# Patient Record
Sex: Male | Born: 1950 | Race: White | Hispanic: No | Marital: Married | Smoking: Never smoker
Health system: Southern US, Community
[De-identification: ages and names within clinical notes are randomized; demographics above are authoritative.]

## PROBLEM LIST (undated history)

## (undated) ENCOUNTER — Inpatient Hospital Stay (HOSPITAL_COMMUNITY): Admission: RE | Payer: Medicare Other | Source: Ambulatory Visit

## (undated) DIAGNOSIS — S82009A Unspecified fracture of unspecified patella, initial encounter for closed fracture: Secondary | ICD-10-CM

## (undated) DIAGNOSIS — M8618 Other acute osteomyelitis, other site: Secondary | ICD-10-CM

## (undated) DIAGNOSIS — A4902 Methicillin resistant Staphylococcus aureus infection, unspecified site: Secondary | ICD-10-CM

## (undated) DIAGNOSIS — T8859XA Other complications of anesthesia, initial encounter: Secondary | ICD-10-CM

## (undated) DIAGNOSIS — Z8614 Personal history of Methicillin resistant Staphylococcus aureus infection: Secondary | ICD-10-CM

## (undated) DIAGNOSIS — T4145XA Adverse effect of unspecified anesthetic, initial encounter: Secondary | ICD-10-CM

## (undated) DIAGNOSIS — Z789 Other specified health status: Secondary | ICD-10-CM

## (undated) HISTORY — PX: COLONOSCOPY: SHX174

## (undated) HISTORY — PX: TONSILLECTOMY: SUR1361

## (undated) HISTORY — PX: REFRACTIVE SURGERY: SHX103

## (undated) HISTORY — PX: CATARACT EXTRACTION W/ INTRAOCULAR LENS  IMPLANT, BILATERAL: SHX1307

---

## 2006-01-30 ENCOUNTER — Ambulatory Visit (HOSPITAL_COMMUNITY): Payer: Self-pay | Admitting: Psychiatry

## 2014-06-11 ENCOUNTER — Encounter (HOSPITAL_COMMUNITY): Payer: Self-pay | Admitting: *Deleted

## 2014-06-11 NOTE — Progress Notes (Signed)
Dr. Magdalene Patricia office notified of need for pre-op orders.

## 2014-06-11 NOTE — Progress Notes (Signed)
06/11/14 1635  OBSTRUCTIVE SLEEP APNEA  Have you ever been diagnosed with sleep apnea through a sleep study? No  Do you snore loudly (loud enough to be heard through closed doors)?  1  Do you often feel tired, fatigued, or sleepy during the daytime? 0  Has anyone observed you stop breathing during your sleep? 0  Do you have, or are you being treated for high blood pressure? 0  BMI more than 35 kg/m2? 0  Age over 62 years old? 1  Neck circumference greater than 40 cm/16 inches? 1  Gender: 1  Obstructive Sleep Apnea Score 4  Score 4 or greater  Results sent to PCP

## 2014-06-12 ENCOUNTER — Encounter (HOSPITAL_COMMUNITY): Payer: No Typology Code available for payment source | Admitting: Anesthesiology

## 2014-06-12 ENCOUNTER — Encounter (HOSPITAL_COMMUNITY): Payer: Self-pay | Admitting: *Deleted

## 2014-06-12 ENCOUNTER — Ambulatory Visit (HOSPITAL_COMMUNITY): Payer: No Typology Code available for payment source

## 2014-06-12 ENCOUNTER — Encounter (HOSPITAL_COMMUNITY)
Admission: RE | Disposition: A | Discharge status: Home / Self Care | Payer: Self-pay | Source: Ambulatory Visit | Attending: Orthopedic Surgery

## 2014-06-12 ENCOUNTER — Observation Stay (HOSPITAL_COMMUNITY)
Admission: RE | Admit: 2014-06-12 | Discharge: 2014-06-13 | Disposition: A | Discharge status: Home / Self Care | Payer: No Typology Code available for payment source | Source: Ambulatory Visit | Attending: Orthopedic Surgery | Admitting: Orthopedic Surgery

## 2014-06-12 ENCOUNTER — Ambulatory Visit (HOSPITAL_COMMUNITY): Payer: No Typology Code available for payment source | Admitting: Anesthesiology

## 2014-06-12 DIAGNOSIS — Y998 Other external cause status: Secondary | ICD-10-CM | POA: Diagnosis not present

## 2014-06-12 DIAGNOSIS — Y92410 Unspecified street and highway as the place of occurrence of the external cause: Secondary | ICD-10-CM | POA: Insufficient documentation

## 2014-06-12 DIAGNOSIS — Y9389 Activity, other specified: Secondary | ICD-10-CM | POA: Diagnosis not present

## 2014-06-12 DIAGNOSIS — S82002A Unspecified fracture of left patella, initial encounter for closed fracture: Secondary | ICD-10-CM

## 2014-06-12 DIAGNOSIS — S82009A Unspecified fracture of unspecified patella, initial encounter for closed fracture: Secondary | ICD-10-CM | POA: Diagnosis present

## 2014-06-12 HISTORY — DX: Other specified health status: Z78.9

## 2014-06-12 HISTORY — PX: ORIF PATELLA: SHX5033

## 2014-06-12 LAB — CBC
HCT: 39.3 % (ref 39.0–52.0)
Hemoglobin: 13.3 g/dL (ref 13.0–17.0)
MCH: 30.2 pg (ref 26.0–34.0)
MCHC: 33.8 g/dL (ref 30.0–36.0)
MCV: 89.1 fL (ref 78.0–100.0)
PLATELETS: 227 10*3/uL (ref 150–400)
RBC: 4.41 MIL/uL (ref 4.22–5.81)
RDW: 12.7 % (ref 11.5–15.5)
WBC: 7.1 10*3/uL (ref 4.0–10.5)

## 2014-06-12 SURGERY — OPEN REDUCTION INTERNAL FIXATION (ORIF) PATELLA
Anesthesia: General | Laterality: Left

## 2014-06-12 MED ORDER — FENTANYL CITRATE 0.05 MG/ML IJ SOLN
INTRAMUSCULAR | Status: DC | PRN
  Administered 2014-06-12: 50 ug via INTRAVENOUS
  Administered 2014-06-12: 100 ug via INTRAVENOUS
  Administered 2014-06-12 (×2): 50 ug via INTRAVENOUS

## 2014-06-12 MED ORDER — CEFAZOLIN SODIUM-DEXTROSE 2-3 GM-% IV SOLR
INTRAVENOUS | Status: AC
  Filled 2014-06-12: qty 50

## 2014-06-12 MED ORDER — HYDROMORPHONE HCL PF 1 MG/ML IJ SOLN
INTRAMUSCULAR | Status: AC
  Administered 2014-06-12: 11:00:00
  Filled 2014-06-12: qty 2

## 2014-06-12 MED ORDER — OXYCODONE HCL 5 MG PO TABS: 5.0000 mg | ORAL_TABLET | ORAL | Status: DC | PRN

## 2014-06-12 MED ORDER — MIDAZOLAM HCL 5 MG/5ML IJ SOLN
INTRAMUSCULAR | Status: DC | PRN
Start: 2014-06-12 — End: 2014-06-12
  Administered 2014-06-12 (×2): 1 mg via INTRAVENOUS

## 2014-06-12 MED ORDER — GLYCOPYRROLATE 0.2 MG/ML IJ SOLN
INTRAMUSCULAR | Status: AC
  Filled 2014-06-12: qty 4

## 2014-06-12 MED ORDER — ONDANSETRON HCL 4 MG/2ML IJ SOLN: 4.0000 mg | Freq: Four times a day (QID) | INTRAMUSCULAR | Status: DC | PRN

## 2014-06-12 MED ORDER — ROCURONIUM BROMIDE 100 MG/10ML IV SOLN
INTRAVENOUS | Status: DC | PRN
  Administered 2014-06-12: 15 mg via INTRAVENOUS
  Administered 2014-06-12: 10 mg via INTRAVENOUS
  Administered 2014-06-12: 35 mg via INTRAVENOUS

## 2014-06-12 MED ORDER — BUPIVACAINE-EPINEPHRINE (PF) 0.25% -1:200000 IJ SOLN
INTRAMUSCULAR | Status: AC
  Filled 2014-06-12: qty 30

## 2014-06-12 MED ORDER — ROCURONIUM BROMIDE 50 MG/5ML IV SOLN
INTRAVENOUS | Status: AC
  Filled 2014-06-12: qty 1

## 2014-06-12 MED ORDER — LACTATED RINGERS IV SOLN
INTRAVENOUS | Status: DC | PRN
  Administered 2014-06-12 (×2): via INTRAVENOUS

## 2014-06-12 MED ORDER — LIDOCAINE HCL (CARDIAC) 20 MG/ML IV SOLN
INTRAVENOUS | Status: DC | PRN
  Administered 2014-06-12: 60 mg via INTRAVENOUS
  Administered 2014-06-12: 40 mg via INTRAVENOUS

## 2014-06-12 MED ORDER — OXYCODONE-ACETAMINOPHEN 5-325 MG PO TABS
ORAL_TABLET | ORAL | Status: AC
  Administered 2014-06-12: 12:00:00
  Filled 2014-06-12: qty 2

## 2014-06-12 MED ORDER — ONDANSETRON HCL 4 MG/2ML IJ SOLN
INTRAMUSCULAR | Status: DC | PRN
Start: 2014-06-12 — End: 2014-06-12
  Administered 2014-06-12: 4 mg via INTRAVENOUS

## 2014-06-12 MED ORDER — CEFAZOLIN SODIUM-DEXTROSE 2-3 GM-% IV SOLR
INTRAVENOUS | Status: DC | PRN
  Administered 2014-06-12: 2 g via INTRAVENOUS

## 2014-06-12 MED ORDER — NEOSTIGMINE METHYLSULFATE 10 MG/10ML IV SOLN
INTRAVENOUS | Status: DC | PRN
  Administered 2014-06-12 (×2): 2.5 mg via INTRAVENOUS

## 2014-06-12 MED ORDER — BUPIVACAINE-EPINEPHRINE (PF) 0.25% -1:200000 IJ SOLN
INTRAMUSCULAR | Status: DC | PRN
  Administered 2014-06-12: 20 mL

## 2014-06-12 MED ORDER — FENTANYL CITRATE 0.05 MG/ML IJ SOLN
INTRAMUSCULAR | Status: AC
  Filled 2014-06-12: qty 5

## 2014-06-12 MED ORDER — GLYCOPYRROLATE 0.2 MG/ML IJ SOLN
INTRAMUSCULAR | Status: DC | PRN
  Administered 2014-06-12 (×2): 0.4 mg via INTRAVENOUS

## 2014-06-12 MED ORDER — 0.9 % SODIUM CHLORIDE (POUR BTL) OPTIME
TOPICAL | Status: DC | PRN
  Administered 2014-06-12: 1000 mL

## 2014-06-12 MED ORDER — PROPOFOL 10 MG/ML IV BOLUS
INTRAVENOUS | Status: AC
  Filled 2014-06-12: qty 20

## 2014-06-12 MED ORDER — LIDOCAINE HCL (CARDIAC) 20 MG/ML IV SOLN
INTRAVENOUS | Status: AC
  Filled 2014-06-12: qty 5

## 2014-06-12 MED ORDER — NEOSTIGMINE METHYLSULFATE 10 MG/10ML IV SOLN
INTRAVENOUS | Status: AC
  Filled 2014-06-12: qty 1

## 2014-06-12 MED ORDER — HYDROMORPHONE HCL PF 1 MG/ML IJ SOLN
0.5000 mg | INTRAMUSCULAR | Status: DC | PRN
  Administered 2014-06-13 (×2): 1 mg via INTRAVENOUS
  Filled 2014-06-12 (×2): qty 1

## 2014-06-12 MED ORDER — PROPOFOL 10 MG/ML IV BOLUS
INTRAVENOUS | Status: DC | PRN
  Administered 2014-06-12: 150 mg via INTRAVENOUS
  Administered 2014-06-12: 20 mg via INTRAVENOUS
  Administered 2014-06-12: 30 mg via INTRAVENOUS

## 2014-06-12 MED ORDER — ONDANSETRON HCL 4 MG/2ML IJ SOLN
INTRAMUSCULAR | Status: AC
  Filled 2014-06-12: qty 2

## 2014-06-12 MED ORDER — OXYCODONE HCL 5 MG PO TABS
5.0000 mg | ORAL_TABLET | ORAL | Status: DC | PRN
  Administered 2014-06-12: 10 mg via ORAL
  Administered 2014-06-12: 5 mg via ORAL
  Administered 2014-06-12: 10 mg via ORAL
  Administered 2014-06-12: 5 mg via ORAL
  Administered 2014-06-13 (×5): 10 mg via ORAL
  Filled 2014-06-12: qty 2
  Filled 2014-06-12: qty 1
  Filled 2014-06-12 (×6): qty 2

## 2014-06-12 MED ORDER — METOCLOPRAMIDE HCL 10 MG PO TABS: 5.0000 mg | ORAL_TABLET | Freq: Three times a day (TID) | ORAL | Status: DC | PRN

## 2014-06-12 MED ORDER — OXYCODONE-ACETAMINOPHEN 5-325 MG PO TABS
1.0000 | ORAL_TABLET | ORAL | Status: DC | PRN
  Administered 2014-06-12 – 2014-06-13 (×5): 2 via ORAL
  Filled 2014-06-12 (×4): qty 2

## 2014-06-12 MED ORDER — MIDAZOLAM HCL 2 MG/2ML IJ SOLN
INTRAMUSCULAR | Status: AC
  Filled 2014-06-12: qty 2

## 2014-06-12 MED ORDER — HYDROMORPHONE HCL PF 1 MG/ML IJ SOLN
0.2500 mg | INTRAMUSCULAR | Status: DC | PRN
  Administered 2014-06-12 (×2): 0.5 mg via INTRAVENOUS
  Administered 2014-06-12 (×2): 1 mg via INTRAVENOUS
  Administered 2014-06-12 (×2): 0.5 mg via INTRAVENOUS

## 2014-06-12 MED ORDER — OXYCODONE HCL 5 MG PO TABS
ORAL_TABLET | ORAL | Status: AC
  Administered 2014-06-12: 15:00:00
  Filled 2014-06-12: qty 1

## 2014-06-12 MED ORDER — DEXTROSE 5 % IV SOLN
INTRAVENOUS | Status: DC | PRN
  Administered 2014-06-12: 09:00:00 via INTRAVENOUS

## 2014-06-12 MED ORDER — METOCLOPRAMIDE HCL 5 MG/ML IJ SOLN: 5.0000 mg | Freq: Three times a day (TID) | INTRAMUSCULAR | Status: DC | PRN

## 2014-06-12 MED ORDER — ONDANSETRON HCL 4 MG PO TABS: 4.0000 mg | ORAL_TABLET | Freq: Four times a day (QID) | ORAL | Status: DC | PRN

## 2014-06-12 SURGICAL SUPPLY — 59 items
BANDAGE ELASTIC 4 VELCRO ST LF (GAUZE/BANDAGES/DRESSINGS) ×2 IMPLANT
BANDAGE ELASTIC 6 VELCRO ST LF (GAUZE/BANDAGES/DRESSINGS) ×3 IMPLANT
BLADE SURG 10 STRL SS (BLADE) IMPLANT
BLADE SURG ROTATE 9660 (MISCELLANEOUS) ×2 IMPLANT
BNDG GAUZE ELAST 4 BULKY (GAUZE/BANDAGES/DRESSINGS) ×2 IMPLANT
BRUSH SCRUB DISP (MISCELLANEOUS) ×3 IMPLANT
COVER SURGICAL LIGHT HANDLE (MISCELLANEOUS) ×3 IMPLANT
CUFF TOURNIQUET SINGLE 34IN LL (TOURNIQUET CUFF) IMPLANT
CUFF TOURNIQUET SINGLE 44IN (TOURNIQUET CUFF) IMPLANT
DECANTER SPIKE VIAL GLASS SM (MISCELLANEOUS) IMPLANT
DRAPE C-ARM 42X72 X-RAY (DRAPES) ×2 IMPLANT
DRAPE C-ARMOR (DRAPES) ×2 IMPLANT
DRSG ADAPTIC 3X8 NADH LF (GAUZE/BANDAGES/DRESSINGS) ×2 IMPLANT
DRSG EMULSION OIL 3X3 NADH (GAUZE/BANDAGES/DRESSINGS) ×1 IMPLANT
DRSG PAD ABDOMINAL 8X10 ST (GAUZE/BANDAGES/DRESSINGS) ×1 IMPLANT
ELECT REM PT RETURN 9FT ADLT (ELECTROSURGICAL) ×2
ELECTRODE REM PT RTRN 9FT ADLT (ELECTROSURGICAL) ×1 IMPLANT
GLOVE BIO SURGEON STRL SZ7.5 (GLOVE) ×2 IMPLANT
GLOVE BIO SURGEON STRL SZ8 (GLOVE) ×2 IMPLANT
GLOVE BIOGEL PI IND STRL 7.5 (GLOVE) ×1 IMPLANT
GLOVE BIOGEL PI IND STRL 8 (GLOVE) ×1 IMPLANT
GLOVE BIOGEL PI INDICATOR 7.5 (GLOVE) ×1
GLOVE BIOGEL PI INDICATOR 8 (GLOVE) ×1
GOWN STRL REUS W/ TWL XL LVL3 (GOWN DISPOSABLE) ×1 IMPLANT
GOWN STRL REUS W/TWL 2XL LVL3 (GOWN DISPOSABLE) ×4 IMPLANT
GOWN STRL REUS W/TWL XL LVL3 (GOWN DISPOSABLE) ×2
KIT BASIN OR (CUSTOM PROCEDURE TRAY) ×2 IMPLANT
KIT ROOM TURNOVER OR (KITS) ×2 IMPLANT
MANIFOLD NEPTUNE II (INSTRUMENTS) ×1 IMPLANT
NEEDLE 22X1 1/2 (OR ONLY) (NEEDLE) IMPLANT
NS IRRIG 1000ML POUR BTL (IV SOLUTION) ×2 IMPLANT
PACK ORTHO EXTREMITY (CUSTOM PROCEDURE TRAY) ×2 IMPLANT
PAD ARMBOARD 7.5X6 YLW CONV (MISCELLANEOUS) ×4 IMPLANT
PAD CAST 4YDX4 CTTN HI CHSV (CAST SUPPLIES) ×2 IMPLANT
PADDING CAST COTTON 4X4 STRL (CAST SUPPLIES)
PASSER SUT SWANSON 36MM LOOP (INSTRUMENTS) IMPLANT
SPONGE GAUZE 4X4 12PLY STER LF (GAUZE/BANDAGES/DRESSINGS) ×1 IMPLANT
SPONGE SCRUB IODOPHOR (GAUZE/BANDAGES/DRESSINGS) ×2 IMPLANT
STAPLER VISISTAT 35W (STAPLE) ×1 IMPLANT
SUCTION FRAZIER TIP 10 FR DISP (SUCTIONS) ×1 IMPLANT
SUT ETHILON 3 0 PS 1 (SUTURE) ×1 IMPLANT
SUT FIBERWIRE #2 38 T-5 BLUE (SUTURE) ×4
SUT FIBERWIRE #5 38 CONV NDL (SUTURE) ×2
SUT STEEL 5 V 56 M (SUTURE) ×1 IMPLANT
SUT VIC AB 0 CT1 27 (SUTURE) ×2
SUT VIC AB 0 CT1 27XBRD ANBCTR (SUTURE) ×1 IMPLANT
SUT VIC AB 1 CT1 27 (SUTURE) ×2
SUT VIC AB 1 CT1 27XBRD ANBCTR (SUTURE) IMPLANT
SUT VIC AB 2-0 CT1 27 (SUTURE) ×2
SUT VIC AB 2-0 CT1 TAPERPNT 27 (SUTURE) ×2 IMPLANT
SUTURE FIBERWR #2 38 T-5 BLUE (SUTURE) IMPLANT
SUTURE FIBERWR #5 38 CONV NDL (SUTURE) IMPLANT
SYR CONTROL 10ML LL (SYRINGE) IMPLANT
TOWEL OR 17X24 6PK STRL BLUE (TOWEL DISPOSABLE) ×2 IMPLANT
TOWEL OR 17X26 10 PK STRL BLUE (TOWEL DISPOSABLE) ×4 IMPLANT
TUBE CONNECTING 12X1/4 (SUCTIONS) ×2 IMPLANT
UNDERPAD 30X30 INCONTINENT (UNDERPADS AND DIAPERS) ×2 IMPLANT
WATER STERILE IRR 1000ML POUR (IV SOLUTION) ×1 IMPLANT
YANKAUER SUCT BULB TIP NO VENT (SUCTIONS) ×1 IMPLANT

## 2014-06-12 NOTE — Brief Op Note (Signed)
06/12/2014  10:36 AM  PATIENT:  Brian Atkinson  63 y.o. male  PRE-OPERATIVE DIAGNOSIS:  left patella fracture  POST-OPERATIVE DIAGNOSIS:  left patella fracture  PROCEDURE:  Procedure(s): OPEN REDUCTION INTERNAL (ORIF) FIXATION PATELLA (Left), Repair of extensor mechanism.  SURGEON:  Surgeon(s) and Role:    * Budd Palmer, MD - Primary  PHYSICIAN ASSISTANT: Hart Carwin, RNFA  ANESTHESIA:   general  I/O:  Total I/O In: 1750 [I.V.:1750] Out: 100 [Blood:100]  SPECIMEN:  No Specimen  TOURNIQUET:  * No tourniquets in log *  DICTATION: .Other Dictation: Dictation Number [id number]

## 2014-06-12 NOTE — Progress Notes (Signed)
[facility] (OTS)  Subjective: Day of Surgery Procedure(s) (LRB): OPEN REDUCTION INTERNAL (ORIF) FIXATION PATELLA (Left) Patient reports pain as moderate and severe, but  Improving and thinks will be good to go tomorrow am.  Objective: Current Vitals Blood pressure 144/72, pulse 66, temperature 97.5 F (36.4 C), temperature source Oral, resp. rate 18, height  (1.803 m), weight 225 lb (102.059 kg), SpO2 98.00%. Vital signs in last 24 hours: Temp:  [97.1 F (36.2 C)-98 F (36.7 C)] 97.5 F (36.4 C) (09/04 1430) Pulse Rate:  [65-97] 66 (09/04 1630) Resp:  [8-20] 18 (09/04 1630) BP: (124-175)/(72-117) 144/72 mmHg (09/04 1630) SpO2:  [92 %-100 %] 98 % (09/04 1630) Weight:  [225 lb (102.059 kg)] 225 lb (102.059 kg) (09/04 0623) LABS  Recent Labs  06/12/14 0644  HGB 13.3    Physical Exam  Immobilizer in place Intact distal sensory and motor function   Imaging  Assessment/Plan: Day of Surgery Procedure(s) (LRB): OPEN REDUCTION INTERNAL (ORIF) FIXATION PATELLA (Left)  D/C to home tomorrow am  Myrene Galas, MD [facility] [phone] (p)   06/12/2014, 8:46 PM
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
## 2014-06-12 NOTE — Anesthesia Procedure Notes (Signed)
Procedure Name: Intubation Date/Time: 06/12/2014 8:27 AM Performed by: Darcey Nora B Pre-anesthesia Checklist: Patient identified, Emergency Drugs available, Suction available and Patient being monitored Patient Re-evaluated:Patient Re-evaluated prior to inductionOxygen Delivery Method: Circle system utilized Preoxygenation: Pre-oxygenation with 100% oxygen Intubation Type: IV induction Ventilation: Mask ventilation without difficulty Laryngoscope Size: Mac and 4 Grade View: Grade II Tube type: Oral Tube size: 7.5 mm Number of attempts: 1 Airway Equipment and Method: Stylet Secured at: 21 (cm at teeth) cm Tube secured with: Tape Dental Injury: Teeth and Oropharynx as per pre-operative assessment

## 2014-06-12 NOTE — Anesthesia Postprocedure Evaluation (Signed)
  Anesthesia Post-op Note  Patient: Brian Atkinson  Procedure(s) Performed: Procedure(s): OPEN REDUCTION INTERNAL (ORIF) FIXATION PATELLA (Left)  Patient Location: PACU  Anesthesia Type:General  Level of Consciousness: awake, alert , oriented and patient cooperative  Airway and Oxygen Therapy: Patient Spontanous Breathing  Post-op Pain: moderate  Post-op Assessment: Post-op Vital signs reviewed, Patient's Cardiovascular Status Stable, Respiratory Function Stable, Patent Airway, No signs of Nausea or vomiting and Pain level controlled  Post-op Vital Signs: stable  Last Vitals:  Filed Vitals:   06/12/14 1115  BP:   Pulse: 79  Temp:   Resp: 8    Complications: No apparent anesthesia complications

## 2014-06-12 NOTE — Anesthesia Preprocedure Evaluation (Addendum)
Anesthesia Evaluation  Patient identified by MRN, date of birth, ID band Patient awake    Reviewed: Allergy & Precautions, H&P , NPO status , Patient's Chart, lab work & pertinent test results  Airway Mallampati: II TM Distance: >3 FB Neck ROM: Full    Dental  (+) Teeth Intact, Caps, Dental Advisory Given   Pulmonary neg pulmonary ROS,          Cardiovascular negative cardio ROS  Rhythm:Regular Rate:Normal     Neuro/Psych    GI/Hepatic negative GI ROS, Neg liver ROS,   Endo/Other    Renal/GU negative Renal ROS     Musculoskeletal   Abdominal   Peds  Hematology   Anesthesia Other Findings Veneers top front row of teeth  Reproductive/Obstetrics                          Anesthesia Physical Anesthesia Plan  ASA: I  Anesthesia Plan: General   Post-op Pain Management:    Induction: Intravenous  Airway Management Planned: Oral ETT  Additional Equipment:   Intra-op Plan:   Post-operative Plan: Extubation in OR  Informed Consent: I have reviewed the patients History and Physical, chart, labs and discussed the procedure including the risks, benefits and alternatives for the proposed anesthesia with the patient or authorized representative who has indicated his/her understanding and acceptance.   Dental advisory given  Plan Discussed with: CRNA and Anesthesiologist  Anesthesia Plan Comments:         Anesthesia Quick Evaluation

## 2014-06-12 NOTE — Transfer of Care (Signed)
Immediate Anesthesia Transfer of Care Note  Patient: Brian Atkinson  Procedure(s) Performed: Procedure(s): OPEN REDUCTION INTERNAL (ORIF) FIXATION PATELLA (Left)  Patient Location: PACU  Anesthesia Type:General  Level of Consciousness: awake and patient cooperative  Airway & Oxygen Therapy: Patient Spontanous Breathing and Patient connected to nasal cannula oxygen  Post-op Assessment: Report given to PACU RN, Post -op Vital signs reviewed and stable and Patient moving all extremities  Post vital signs: Reviewed and stable  Complications: No apparent anesthesia complications

## 2014-06-12 NOTE — Discharge Instructions (Signed)
What to eat:  For your first meals, you should eat lightly; only small meals initially.  If you do not have nausea, you may eat larger meals.  Avoid spicy, greasy and heavy food.    General Anesthesia, Adult, Care After  Refer to this sheet in the next few weeks. These instructions provide you with information on caring for yourself after your procedure. Your health care provider may also give you more specific instructions. Your treatment has been planned according to current medical practices, but problems sometimes occur. Call your health care provider if you have any problems or questions after your procedure.  WHAT TO EXPECT AFTER THE PROCEDURE  After the procedure, it is typical to experience:  Sleepiness.  Nausea and vomiting. HOME CARE INSTRUCTIONS  For the first 24 hours after general anesthesia:  Have a responsible person with you.  Do not drive a car. If you are alone, do not take public transportation.  Do not drink alcohol.  Do not take medicine that has not been prescribed by your health care provider.  Do not sign important papers or make important decisions.  You may resume a normal diet and activities as directed by your health care provider.  Change bandages (dressings) as directed.  If you have questions or problems that seem related to general anesthesia, call the hospital and ask for the anesthetist or anesthesiologist on call. SEEK MEDICAL CARE IF:  You have nausea and vomiting that continue the day after anesthesia.  You develop a rash. SEEK IMMEDIATE MEDICAL CARE IF:  You have difficulty breathing.  You have chest pain.  You have any allergic problems. Document Released: 01/01/2001 Document Revised: 05/28/2013 Document Reviewed: 04/10/2013  [facility] Patient Information 2014 [place], [place].    [facility] Discharge Instructions  General Discharge Instructions  WEIGHT BEARING STATUS: Weightbearing as tolerated Left leg with knee in full  extension   RANGE OF MOTION/ACTIVITY: No knee ROM at this time, hinged brace on at all times locked out in full extension   Wound care: dressing changes starting on 06/14/2014, see detailed instructions below   Diet: as you were eating previously.  Can use over the counter stool softeners and bowel preparations, such as Miralax, to help with bowel movements.  Narcotics can be constipating.  Be sure to drink plenty of fluids  STOP SMOKING OR USING NICOTINE PRODUCTS!!!!  As discussed nicotine severely impairs your body's ability to heal surgical and traumatic wounds but also impairs bone healing.  Wounds and bone heal by forming microscopic blood vessels (angiogenesis) and nicotine is a vasoconstrictor (essentially, shrinks blood vessels).  Therefore, if vasoconstriction occurs to these microscopic blood vessels they essentially disappear and are unable to deliver necessary nutrients to the healing tissue.  This is one modifiable factor that you can do to dramatically increase your chances of healing your injury.    (This means no smoking, no nicotine gum, patches, etc)  DO NOT USE NONSTEROIDAL ANTI-INFLAMMATORY DRUGS (NSAID'S)  Using products such as Advil (ibuprofen), Aleve (naproxen), Motrin (ibuprofen) for additional pain control during fracture healing can delay and/or prevent the healing response.  If you would like to take over the counter (OTC) medication, Tylenol (acetaminophen) is ok.  However, some narcotic medications that are given for pain control contain acetaminophen as well. Therefore, you should not exceed more than 4000 mg of tylenol in a day if you do not have liver disease.  Also note that there are may OTC medicines, such as cold medicines and  allergy medicines that my contain tylenol as well.  If you have any questions about medications and/or interactions please ask your doctor/PA or your pharmacist.   PAIN MEDICATION USE AND EXPECTATIONS  You have likely been given narcotic  medications to help control your pain.  After a traumatic event that results in an fracture (broken bone) with or without surgery, it is ok to use narcotic pain medications to help control one's pain.  We understand that everyone responds to pain differently and each individual patient will be evaluated on a regular basis for the continued need for narcotic medications. Ideally, narcotic medication use should last no more than 6-8 weeks (coinciding with fracture healing).   As a patient it is your responsibility as well to monitor narcotic medication use and report the amount and frequency you use these medications when you come to your office visit.   We would also advise that if you are using narcotic medications, you should take a dose prior to therapy to maximize you participation.  IF YOU ARE ON NARCOTIC MEDICATIONS IT IS NOT PERMISSIBLE TO OPERATE A MOTOR VEHICLE (MOTORCYCLE/CAR/TRUCK/MOPED) OR HEAVY MACHINERY DO NOT MIX NARCOTICS WITH OTHER CNS (CENTRAL NERVOUS SYSTEM) DEPRESSANTS SUCH AS ALCOHOL       ICE AND ELEVATE INJURED/OPERATIVE EXTREMITY  Using ice and elevating the injured extremity above your heart can help with swelling and pain control.  Icing in a pulsatile fashion, such as 20 minutes on and 20 minutes off, can be followed.    Do not place ice directly on skin. Make sure there is a barrier between to skin and the ice pack.    Using frozen items such as frozen peas works well as the conform nicely to the are that needs to be iced.  USE AN ACE WRAP OR TED HOSE FOR SWELLING CONTROL  In addition to icing and elevation, Ace wraps or TED hose are used to help limit and resolve swelling.  It is recommended to use Ace wraps or TED hose until you are informed to stop.    When using Ace Wraps start the wrapping distally (farthest away from the body) and wrap proximally (closer to the body)   Example: If you had surgery on your leg or thing and you do not have a splint on, start the ace  wrap at the toes and work your way up to the thigh        If you had surgery on your upper extremity and do not have a splint on, start the ace wrap at your fingers and work your way up to the upper arm  IF YOU ARE IN A SPLINT OR CAST DO NOT REMOVE IT FOR ANY REASON   If your splint gets wet for any reason please contact the office immediately. You may shower in your splint or cast as long as you keep it dry.  This can be done by wrapping in a cast cover or garbage back (or similar)  Do Not stick any thing down your splint or cast such as pencils, money, or hangers to try and scratch yourself with.  If you feel itchy take benadryl as prescribed on the bottle for itching  IF YOU ARE IN A CAM BOOT (BLACK BOOT)  You may remove boot periodically. Perform daily dressing changes as noted below.  Wash the liner of the boot regularly and wear a sock when wearing the boot. It is recommended that you sleep in the boot until told otherwise  CALL  THE OFFICE WITH ANY QUESTIONS OR CONCERTS: [phone]     Discharge Pin Site Instructions  Dress pins daily with Kerlix roll starting on POD 2. Wrap the Kerlix so that it tamps the skin down around the pin-skin interface to prevent/limit motion of the skin relative to the pin.  (Pin-skin motion is the primary cause of pain and infection related to external fixator pin sites).  Remove any crust or coagulum that may obstruct drainage with a saline moistened gauze or soap and water.  After POD 3, if there is no discernable drainage on the pin site dressing, the interval for change can by increased to every other day.  You may shower with the fixator, cleaning all pin sites gently with soap and water.  If you have a surgical wound this needs to be completely dry and without drainage before showering.  The extremity can be lifted by the fixator to facilitate wound care and transfers.  Notify the office/Doctor if you experience increasing drainage, redness, or  pain from a pin site, or if you notice purulent (thick, snot-like) drainage.  Discharge Wound Care Instructions  Do NOT apply any ointments, solutions or lotions to pin sites or surgical wounds.  These prevent needed drainage and even though solutions like hydrogen peroxide kill bacteria, they also damage cells lining the pin sites that help fight infection.  Applying lotions or ointments can keep the wounds moist and can cause them to breakdown and open up as well. This can increase the risk for infection. When in doubt call the office.  Surgical incisions should be dressed daily.  If any drainage is noted, use one layer of adaptic, then gauze, Kerlix, and an ace wrap.  Once the incision is completely dry and without drainage, it may be left open to air out.  Showering may begin 36-48 hours later.  Cleaning gently with soap and water.  Traumatic wounds should be dressed daily as well.    One layer of adaptic, gauze, Kerlix, then ace wrap.  The adaptic can be discontinued once the draining has ceased    If you have a wet to dry dressing: wet the gauze with saline the squeeze as much saline out so the gauze is moist (not soaking wet), place moistened gauze over wound, then place a dry gauze over the moist one, followed by Kerlix wrap, then ace wrap.

## 2014-06-13 DIAGNOSIS — S82009A Unspecified fracture of unspecified patella, initial encounter for closed fracture: Secondary | ICD-10-CM | POA: Diagnosis not present
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
## 2014-06-13 NOTE — Progress Notes (Signed)
Discharge instructions and prescriptions reviewed with patient. Patient denies questions or concerns at this time. IV removed with no complications. VS stable. Patient discharged via wheelchair with personal belongings, discharge packet and prescriptions, and walker.

## 2014-06-16 ENCOUNTER — Inpatient Hospital Stay (HOSPITAL_COMMUNITY)
Admission: AD | Admit: 2014-06-16 | Discharge: 2014-06-23 | DRG: 854 | Disposition: A | Discharge status: Home Health | Payer: BC Managed Care – PPO | Source: Other Acute Inpatient Hospital | Attending: Internal Medicine | Admitting: Internal Medicine

## 2014-06-16 ENCOUNTER — Encounter (HOSPITAL_COMMUNITY): Payer: Self-pay

## 2014-06-16 ENCOUNTER — Inpatient Hospital Stay (HOSPITAL_COMMUNITY): Payer: BC Managed Care – PPO

## 2014-06-16 DIAGNOSIS — K5909 Other constipation: Secondary | ICD-10-CM | POA: Diagnosis present

## 2014-06-16 DIAGNOSIS — T8140XA Infection following a procedure, unspecified, initial encounter: Secondary | ICD-10-CM | POA: Diagnosis present

## 2014-06-16 DIAGNOSIS — R6510 Systemic inflammatory response syndrome (SIRS) of non-infectious origin without acute organ dysfunction: Secondary | ICD-10-CM

## 2014-06-16 DIAGNOSIS — D509 Iron deficiency anemia, unspecified: Secondary | ICD-10-CM | POA: Diagnosis present

## 2014-06-16 DIAGNOSIS — R5082 Postprocedural fever: Secondary | ICD-10-CM | POA: Diagnosis present

## 2014-06-16 DIAGNOSIS — E871 Hypo-osmolality and hyponatremia: Secondary | ICD-10-CM | POA: Diagnosis present

## 2014-06-16 DIAGNOSIS — S20219A Contusion of unspecified front wall of thorax, initial encounter: Secondary | ICD-10-CM | POA: Diagnosis present

## 2014-06-16 DIAGNOSIS — R52 Pain, unspecified: Secondary | ICD-10-CM | POA: Diagnosis present

## 2014-06-16 DIAGNOSIS — A419 Sepsis, unspecified organism: Secondary | ICD-10-CM | POA: Diagnosis present

## 2014-06-16 DIAGNOSIS — S82002A Unspecified fracture of left patella, initial encounter for closed fracture: Secondary | ICD-10-CM

## 2014-06-16 DIAGNOSIS — T4275XA Adverse effect of unspecified antiepileptic and sedative-hypnotic drugs, initial encounter: Secondary | ICD-10-CM | POA: Diagnosis present

## 2014-06-16 DIAGNOSIS — R110 Nausea: Secondary | ICD-10-CM | POA: Diagnosis present

## 2014-06-16 DIAGNOSIS — S91009A Unspecified open wound, unspecified ankle, initial encounter: Secondary | ICD-10-CM

## 2014-06-16 DIAGNOSIS — M009 Pyogenic arthritis, unspecified: Secondary | ICD-10-CM | POA: Diagnosis present

## 2014-06-16 DIAGNOSIS — A4901 Methicillin susceptible Staphylococcus aureus infection, unspecified site: Secondary | ICD-10-CM | POA: Diagnosis not present

## 2014-06-16 DIAGNOSIS — A4102 Sepsis due to Methicillin resistant Staphylococcus aureus: Secondary | ICD-10-CM

## 2014-06-16 DIAGNOSIS — K5900 Constipation, unspecified: Secondary | ICD-10-CM | POA: Diagnosis present

## 2014-06-16 DIAGNOSIS — R509 Fever, unspecified: Secondary | ICD-10-CM

## 2014-06-16 DIAGNOSIS — S81809A Unspecified open wound, unspecified lower leg, initial encounter: Secondary | ICD-10-CM | POA: Diagnosis present

## 2014-06-16 DIAGNOSIS — S82009A Unspecified fracture of unspecified patella, initial encounter for closed fracture: Secondary | ICD-10-CM | POA: Diagnosis present

## 2014-06-16 DIAGNOSIS — S81009A Unspecified open wound, unspecified knee, initial encounter: Secondary | ICD-10-CM | POA: Diagnosis present

## 2014-06-16 DIAGNOSIS — S82002P Unspecified fracture of left patella, subsequent encounter for closed fracture with malunion: Secondary | ICD-10-CM

## 2014-06-16 DIAGNOSIS — D62 Acute posthemorrhagic anemia: Secondary | ICD-10-CM | POA: Diagnosis not present

## 2014-06-16 DIAGNOSIS — D649 Anemia, unspecified: Secondary | ICD-10-CM | POA: Diagnosis present

## 2014-06-16 DIAGNOSIS — Y838 Other surgical procedures as the cause of abnormal reaction of the patient, or of later complication, without mention of misadventure at the time of the procedure: Secondary | ICD-10-CM | POA: Diagnosis present

## 2014-06-16 DIAGNOSIS — IMO0002 Reserved for concepts with insufficient information to code with codable children: Secondary | ICD-10-CM | POA: Diagnosis not present

## 2014-06-16 DIAGNOSIS — Z79899 Other long term (current) drug therapy: Secondary | ICD-10-CM | POA: Diagnosis not present

## 2014-06-16 DIAGNOSIS — M00869 Arthritis due to other bacteria, unspecified knee: Secondary | ICD-10-CM | POA: Diagnosis present

## 2014-06-16 DIAGNOSIS — R000 Tachycardia, unspecified: Secondary | ICD-10-CM | POA: Diagnosis present

## 2014-06-16 LAB — URINALYSIS, ROUTINE W REFLEX MICROSCOPIC
GLUCOSE, UA: NEGATIVE mg/dL
Hgb urine dipstick: NEGATIVE
KETONES UR: 40 mg/dL — AB
Leukocytes, UA: NEGATIVE
Nitrite: NEGATIVE
PH: 6.5 (ref 5.0–8.0)
Protein, ur: 100 mg/dL — AB
Specific Gravity, Urine: 1.029 (ref 1.005–1.030)
Urobilinogen, UA: 1 mg/dL (ref 0.0–1.0)

## 2014-06-16 LAB — C-REACTIVE PROTEIN: CRP: 18.6 mg/dL — AB (ref <0.60)

## 2014-06-16 LAB — URINE MICROSCOPIC-ADD ON

## 2014-06-16 LAB — SEDIMENTATION RATE: Sed Rate: 97 mm/hr — ABNORMAL HIGH (ref 0–16)

## 2014-06-16 MED ORDER — OXYCODONE HCL 5 MG PO TABS
5.0000 mg | ORAL_TABLET | ORAL | Status: DC | PRN
  Administered 2014-06-16: 5 mg via ORAL
  Administered 2014-06-16 – 2014-06-17 (×3): 10 mg via ORAL
  Filled 2014-06-16: qty 2
  Filled 2014-06-16: qty 1
  Filled 2014-06-16 (×2): qty 2

## 2014-06-16 MED ORDER — ACETAMINOPHEN 325 MG PO TABS
650.0000 mg | ORAL_TABLET | Freq: Four times a day (QID) | ORAL | Status: DC | PRN
Start: 2014-06-16 — End: 2014-06-23

## 2014-06-16 MED ORDER — ACETAMINOPHEN 500 MG PO TABS
500.0000 mg | ORAL_TABLET | Freq: Once | ORAL | Status: AC
  Administered 2014-06-16: 500 mg via ORAL
  Filled 2014-06-16: qty 1

## 2014-06-16 MED ORDER — TRAMADOL HCL 50 MG PO TABS
50.0000 mg | ORAL_TABLET | Freq: Two times a day (BID) | ORAL | Status: DC | PRN
  Filled 2014-06-16: qty 2

## 2014-06-16 MED ORDER — ACETAMINOPHEN 650 MG RE SUPP: 650.0000 mg | Freq: Four times a day (QID) | RECTAL | Status: DC | PRN

## 2014-06-16 MED ORDER — TRAMADOL HCL 50 MG PO TABS
50.0000 mg | ORAL_TABLET | Freq: Three times a day (TID) | ORAL | Status: DC | PRN
Start: 2014-06-16 — End: 2014-06-17
  Administered 2014-06-16: 100 mg via ORAL

## 2014-06-16 MED ORDER — ONDANSETRON HCL 4 MG PO TABS
4.0000 mg | ORAL_TABLET | Freq: Four times a day (QID) | ORAL | Status: DC | PRN
Start: 2014-06-16 — End: 2014-06-23

## 2014-06-16 MED ORDER — SODIUM CHLORIDE 0.9 % IV BOLUS (SEPSIS)
1000.0000 mL | Freq: Once | INTRAVENOUS | Status: AC
  Administered 2014-06-16: 1000 mL via INTRAVENOUS

## 2014-06-16 MED ORDER — HYDROCODONE-ACETAMINOPHEN 10-325 MG PO TABS
1.0000 | ORAL_TABLET | Freq: Four times a day (QID) | ORAL | Status: DC | PRN
  Administered 2014-06-16: 2 via ORAL
  Administered 2014-06-16 (×2): 1 via ORAL
  Administered 2014-06-17: 2 via ORAL
  Administered 2014-06-18 – 2014-06-22 (×10): 1 via ORAL
  Administered 2014-06-22 (×2): 2 via ORAL
  Administered 2014-06-23 (×2): 1 via ORAL
  Filled 2014-06-16 (×2): qty 1
  Filled 2014-06-16: qty 2
  Filled 2014-06-16 (×3): qty 1
  Filled 2014-06-16: qty 2
  Filled 2014-06-16 (×3): qty 1
  Filled 2014-06-16: qty 2
  Filled 2014-06-16 (×3): qty 1
  Filled 2014-06-16: qty 2
  Filled 2014-06-16 (×3): qty 1

## 2014-06-16 MED ORDER — HYDROMORPHONE HCL PF 1 MG/ML IJ SOLN
1.0000 mg | INTRAMUSCULAR | Status: DC | PRN
  Administered 2014-06-16 – 2014-06-18 (×4): 1 mg via INTRAVENOUS
  Filled 2014-06-16 (×4): qty 1

## 2014-06-16 MED ORDER — HYDROMORPHONE HCL PF 1 MG/ML IJ SOLN
1.0000 mg | INTRAMUSCULAR | Status: DC | PRN
  Administered 2014-06-16 (×2): 1 mg via INTRAVENOUS
  Filled 2014-06-16 (×2): qty 1

## 2014-06-16 MED ORDER — ONDANSETRON HCL 4 MG/2ML IJ SOLN: 4.0000 mg | Freq: Four times a day (QID) | INTRAMUSCULAR | Status: DC | PRN

## 2014-06-16 MED ORDER — ENOXAPARIN SODIUM 40 MG/0.4ML ~~LOC~~ SOLN
40.0000 mg | SUBCUTANEOUS | Status: DC
  Administered 2014-06-16 – 2014-06-18 (×3): 40 mg via SUBCUTANEOUS
  Filled 2014-06-16 (×4): qty 0.4

## 2014-06-16 MED ORDER — METHOCARBAMOL 500 MG PO TABS
1000.0000 mg | ORAL_TABLET | Freq: Four times a day (QID) | ORAL | Status: DC
  Administered 2014-06-16 (×3): 1000 mg via ORAL
  Administered 2014-06-17: 5000 mg via ORAL
  Administered 2014-06-17 – 2014-06-23 (×20): 1000 mg via ORAL
  Filled 2014-06-16 (×39): qty 2

## 2014-06-16 MED ORDER — DOCUSATE SODIUM 100 MG PO CAPS
100.0000 mg | ORAL_CAPSULE | Freq: Two times a day (BID) | ORAL | Status: DC
  Administered 2014-06-16 – 2014-06-18 (×3): 100 mg via ORAL

## 2014-06-16 NOTE — Progress Notes (Signed)
Utilization review completed.  
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
## 2014-06-16 NOTE — Progress Notes (Signed)
Will call TRH1 night call as pt has Fever of 102.7 despite po Vicodin @ 2130.

## 2014-06-17 ENCOUNTER — Inpatient Hospital Stay (HOSPITAL_COMMUNITY): Payer: BC Managed Care – PPO

## 2014-06-17 ENCOUNTER — Inpatient Hospital Stay (HOSPITAL_COMMUNITY): Payer: BC Managed Care – PPO | Admitting: Registered Nurse

## 2014-06-17 ENCOUNTER — Encounter (HOSPITAL_COMMUNITY): Payer: BC Managed Care – PPO | Admitting: Registered Nurse

## 2014-06-17 ENCOUNTER — Encounter (HOSPITAL_COMMUNITY): Payer: Self-pay | Admitting: Registered Nurse

## 2014-06-17 ENCOUNTER — Encounter (HOSPITAL_COMMUNITY)
Admission: AD | Disposition: A | Discharge status: Home Health | Payer: Self-pay | Source: Other Acute Inpatient Hospital | Attending: Internal Medicine

## 2014-06-17 DIAGNOSIS — M009 Pyogenic arthritis, unspecified: Secondary | ICD-10-CM

## 2014-06-17 DIAGNOSIS — M00869 Arthritis due to other bacteria, unspecified knee: Secondary | ICD-10-CM | POA: Diagnosis present

## 2014-06-17 DIAGNOSIS — R509 Fever, unspecified: Secondary | ICD-10-CM | POA: Diagnosis present

## 2014-06-17 DIAGNOSIS — A419 Sepsis, unspecified organism: Secondary | ICD-10-CM

## 2014-06-17 HISTORY — PX: IRRIGATION AND DEBRIDEMENT KNEE: SHX5185

## 2014-06-17 LAB — COMPREHENSIVE METABOLIC PANEL
ALBUMIN: 2.7 g/dL — AB (ref 3.5–5.2)
ALK PHOS: 74 U/L (ref 39–117)
ALT: 10 U/L (ref 0–53)
AST: 13 U/L (ref 0–37)
Anion gap: 13 (ref 5–15)
BUN: 9 mg/dL (ref 6–23)
CALCIUM: 8.6 mg/dL (ref 8.4–10.5)
CO2: 24 mEq/L (ref 19–32)
Chloride: 92 mEq/L — ABNORMAL LOW (ref 96–112)
Creatinine, Ser: 0.89 mg/dL (ref 0.50–1.35)
GFR calc non Af Amer: 90 mL/min — ABNORMAL LOW (ref >90)
GLUCOSE: 109 mg/dL — AB (ref 70–99)
POTASSIUM: 4.1 meq/L (ref 3.7–5.3)
SODIUM: 129 meq/L — AB (ref 137–147)
TOTAL PROTEIN: 7.3 g/dL (ref 6.0–8.3)
Total Bilirubin: 1 mg/dL (ref 0.3–1.2)

## 2014-06-17 LAB — CBC
HCT: 37.5 % — ABNORMAL LOW (ref 39.0–52.0)
HEMOGLOBIN: 12.4 g/dL — AB (ref 13.0–17.0)
MCH: 30 pg (ref 26.0–34.0)
MCHC: 33.1 g/dL (ref 30.0–36.0)
MCV: 90.8 fL (ref 78.0–100.0)
Platelets: 276 10*3/uL (ref 150–400)
RBC: 4.13 MIL/uL — AB (ref 4.22–5.81)
RDW: 12.3 % (ref 11.5–15.5)
WBC: 24.2 10*3/uL — ABNORMAL HIGH (ref 4.0–10.5)

## 2014-06-17 LAB — GRAM STAIN

## 2014-06-17 LAB — SURGICAL PCR SCREEN
MRSA, PCR: INVALID — AB
Staphylococcus aureus: INVALID — AB

## 2014-06-17 SURGERY — IRRIGATION AND DEBRIDEMENT KNEE
Anesthesia: General | Laterality: Left

## 2014-06-17 MED ORDER — PHENYLEPHRINE HCL 10 MG/ML IJ SOLN
INTRAMUSCULAR | Status: DC | PRN
  Administered 2014-06-17: 80 ug via INTRAVENOUS
  Administered 2014-06-17: 40 ug via INTRAVENOUS
  Administered 2014-06-17: 80 ug via INTRAVENOUS

## 2014-06-17 MED ORDER — LACTATED RINGERS IV SOLN
INTRAVENOUS | Status: DC | PRN
  Administered 2014-06-17: 10:00:00 via INTRAVENOUS

## 2014-06-17 MED ORDER — SODIUM CHLORIDE 0.9 % IV SOLN
INTRAVENOUS | Status: DC
  Administered 2014-06-17 – 2014-06-18 (×5): via INTRAVENOUS
  Filled 2014-06-17 (×11): qty 1000

## 2014-06-17 MED ORDER — VANCOMYCIN HCL 1000 MG IV SOLR
INTRAVENOUS | Status: DC | PRN
  Administered 2014-06-17: 1 g

## 2014-06-17 MED ORDER — PIPERACILLIN-TAZOBACTAM 3.375 G IVPB
3.3750 g | Freq: Three times a day (TID) | INTRAVENOUS | Status: DC
  Administered 2014-06-17 – 2014-06-18 (×4): 3.375 g via INTRAVENOUS
  Filled 2014-06-17 (×9): qty 50

## 2014-06-17 MED ORDER — MEPERIDINE HCL 50 MG/ML IJ SOLN: 6.2500 mg | INTRAMUSCULAR | Status: DC | PRN

## 2014-06-17 MED ORDER — ACETAMINOPHEN 10 MG/ML IV SOLN
1000.0000 mg | Freq: Once | INTRAVENOUS | Status: AC
  Administered 2014-06-17: 1000 mg via INTRAVENOUS
  Filled 2014-06-17: qty 100

## 2014-06-17 MED ORDER — FENTANYL CITRATE 0.05 MG/ML IJ SOLN: 25.0000 ug | INTRAMUSCULAR | Status: DC | PRN

## 2014-06-17 MED ORDER — FENTANYL CITRATE 0.05 MG/ML IJ SOLN
INTRAMUSCULAR | Status: AC
  Filled 2014-06-17: qty 5

## 2014-06-17 MED ORDER — PROPOFOL 10 MG/ML IV BOLUS
INTRAVENOUS | Status: AC
  Filled 2014-06-17: qty 20

## 2014-06-17 MED ORDER — LIDOCAINE HCL (CARDIAC) 20 MG/ML IV SOLN
INTRAVENOUS | Status: DC | PRN
  Administered 2014-06-17: 100 mg via INTRAVENOUS

## 2014-06-17 MED ORDER — SODIUM CHLORIDE 0.9 % IR SOLN
Status: AC
  Filled 2014-06-17: qty 1

## 2014-06-17 MED ORDER — PROPOFOL 10 MG/ML IV BOLUS
INTRAVENOUS | Status: DC | PRN
  Administered 2014-06-17: 200 mg via INTRAVENOUS

## 2014-06-17 MED ORDER — ONDANSETRON HCL 4 MG/2ML IJ SOLN
INTRAMUSCULAR | Status: DC | PRN
  Administered 2014-06-17: 4 mg via INTRAVENOUS

## 2014-06-17 MED ORDER — VANCOMYCIN HCL 10 G IV SOLR
2000.0000 mg | Freq: Once | INTRAVENOUS | Status: AC
  Administered 2014-06-17: 2000 mg via INTRAVENOUS
  Filled 2014-06-17 (×2): qty 2000

## 2014-06-17 MED ORDER — EPHEDRINE SULFATE 50 MG/ML IJ SOLN
INTRAMUSCULAR | Status: AC
  Filled 2014-06-17: qty 1

## 2014-06-17 MED ORDER — ONDANSETRON HCL 4 MG/2ML IJ SOLN
INTRAMUSCULAR | Status: AC
  Filled 2014-06-17: qty 2

## 2014-06-17 MED ORDER — ATROPINE SULFATE 0.4 MG/ML IJ SOLN
INTRAMUSCULAR | Status: AC
  Filled 2014-06-17: qty 2

## 2014-06-17 MED ORDER — BUPIVACAINE HCL (PF) 0.5 % IJ SOLN
INTRAMUSCULAR | Status: AC
  Filled 2014-06-17: qty 30

## 2014-06-17 MED ORDER — VANCOMYCIN HCL IN DEXTROSE 1-5 GM/200ML-% IV SOLN
1000.0000 mg | Freq: Two times a day (BID) | INTRAVENOUS | Status: DC
  Administered 2014-06-18 – 2014-06-20 (×6): 1000 mg via INTRAVENOUS
  Filled 2014-06-17 (×10): qty 200

## 2014-06-17 MED ORDER — OXYCODONE HCL 5 MG PO TABS
5.0000 mg | ORAL_TABLET | ORAL | Status: DC | PRN
  Administered 2014-06-17 – 2014-06-18 (×7): 10 mg via ORAL
  Filled 2014-06-17 (×7): qty 2

## 2014-06-17 MED ORDER — FENTANYL CITRATE 0.05 MG/ML IJ SOLN
INTRAMUSCULAR | Status: DC | PRN
  Administered 2014-06-17 (×5): 50 ug via INTRAVENOUS

## 2014-06-17 MED ORDER — MIDAZOLAM HCL 2 MG/2ML IJ SOLN
INTRAMUSCULAR | Status: AC
  Filled 2014-06-17: qty 2

## 2014-06-17 MED ORDER — SODIUM CHLORIDE 0.9 % IR SOLN
Status: DC | PRN
  Administered 2014-06-17: 6000 mL

## 2014-06-17 MED ORDER — CEFAZOLIN SODIUM-DEXTROSE 2-3 GM-% IV SOLR
INTRAVENOUS | Status: AC
  Filled 2014-06-17: qty 50

## 2014-06-17 MED ORDER — SODIUM CHLORIDE 0.9 % IJ SOLN
INTRAMUSCULAR | Status: AC
  Filled 2014-06-17: qty 10

## 2014-06-17 MED ORDER — PROMETHAZINE HCL 25 MG/ML IJ SOLN: 6.2500 mg | INTRAMUSCULAR | Status: DC | PRN

## 2014-06-17 MED ORDER — PIPERACILLIN-TAZOBACTAM 3.375 G IVPB
INTRAVENOUS | Status: AC
  Filled 2014-06-17: qty 50

## 2014-06-17 MED ORDER — MIDAZOLAM HCL 5 MG/5ML IJ SOLN
INTRAMUSCULAR | Status: DC | PRN
  Administered 2014-06-17: 1 mg via INTRAVENOUS

## 2014-06-17 MED ORDER — LIDOCAINE HCL (CARDIAC) 20 MG/ML IV SOLN
INTRAVENOUS | Status: AC
  Filled 2014-06-17: qty 5

## 2014-06-17 MED ORDER — TOBRAMYCIN SULFATE 1.2 G IJ SOLR
INTRAMUSCULAR | Status: DC | PRN
  Administered 2014-06-17: 1.2 g

## 2014-06-17 MED ORDER — VANCOMYCIN HCL 1000 MG IV SOLR
INTRAVENOUS | Status: AC
  Filled 2014-06-17: qty 1000

## 2014-06-17 MED ORDER — LACTATED RINGERS IV SOLN: INTRAVENOUS | Status: DC

## 2014-06-17 MED ORDER — PHENYLEPHRINE 40 MCG/ML (10ML) SYRINGE FOR IV PUSH (FOR BLOOD PRESSURE SUPPORT)
PREFILLED_SYRINGE | INTRAVENOUS | Status: AC
  Filled 2014-06-17: qty 10

## 2014-06-17 MED ORDER — TOBRAMYCIN SULFATE 1.2 G IJ SOLR
INTRAMUSCULAR | Status: AC
  Filled 2014-06-17: qty 1.2

## 2014-06-17 MED ORDER — TRAMADOL HCL 50 MG PO TABS: 50.0000 mg | ORAL_TABLET | Freq: Three times a day (TID) | ORAL | Status: DC | PRN

## 2014-06-17 SURGICAL SUPPLY — 51 items
BAG SPEC THK2 15X12 ZIP CLS (MISCELLANEOUS)
BAG ZIPLOCK 12X15 (MISCELLANEOUS) ×1 IMPLANT
BANDAGE ELASTIC 4 VELCRO ST LF (GAUZE/BANDAGES/DRESSINGS) ×1 IMPLANT
BANDAGE ELASTIC 6 VELCRO ST LF (GAUZE/BANDAGES/DRESSINGS) ×3 IMPLANT
BANDAGE ESMARK 6X9 LF (GAUZE/BANDAGES/DRESSINGS) ×1 IMPLANT
BNDG CMPR 9X6 STRL LF SNTH (GAUZE/BANDAGES/DRESSINGS) ×1
BNDG ESMARK 6X9 LF (GAUZE/BANDAGES/DRESSINGS) ×2
CUFF TOURN SGL QUICK 34 (TOURNIQUET CUFF)
CUFF TRNQT CYL 34X4X40X1 (TOURNIQUET CUFF) ×1 IMPLANT
DRAPE C-ARMOR (DRAPES) ×1 IMPLANT
DRAPE EXTREMITY T 121X128X90 (DRAPE) IMPLANT
DRAPE POUCH INSTRU U-SHP 10X18 (DRAPES) ×2 IMPLANT
DRAPE U-SHAPE 47X51 STRL (DRAPES) ×2 IMPLANT
DRSG ADAPTIC 3X8 NADH LF (GAUZE/BANDAGES/DRESSINGS) ×1 IMPLANT
DRSG PAD ABDOMINAL 8X10 ST (GAUZE/BANDAGES/DRESSINGS) ×1 IMPLANT
DRSG VAC ATS MED SENSATRAC (GAUZE/BANDAGES/DRESSINGS) ×1 IMPLANT
DURAPREP 26ML APPLICATOR (WOUND CARE) ×1 IMPLANT
ELECT REM PT RETURN 9FT ADLT (ELECTROSURGICAL) ×2
ELECTRODE REM PT RTRN 9FT ADLT (ELECTROSURGICAL) ×1 IMPLANT
EVACUATOR 1/8 PVC DRAIN (DRAIN) ×3 IMPLANT
GAUZE SPONGE 4X4 12PLY STRL (GAUZE/BANDAGES/DRESSINGS) ×1 IMPLANT
GLOVE BIO SURGEON STRL SZ7.5 (GLOVE) ×1 IMPLANT
GLOVE BIO SURGEON STRL SZ8 (GLOVE) ×2 IMPLANT
GLOVE BIOGEL PI IND STRL 8 (GLOVE) ×2 IMPLANT
GLOVE BIOGEL PI INDICATOR 8 (GLOVE) ×6
GLOVE ECLIPSE 8.0 STRL XLNG CF (GLOVE) IMPLANT
GOWN STRL REUS W/TWL LRG LVL3 (GOWN DISPOSABLE) ×1 IMPLANT
GOWN STRL REUS W/TWL XL LVL3 (GOWN DISPOSABLE) ×5 IMPLANT
HANDPIECE INTERPULSE COAX TIP (DISPOSABLE)
IMMOBILIZER KNEE 22  40 CIR (ORTHOPEDIC SUPPLIES) ×1
IMMOBILIZER KNEE 22 40 CIR (ORTHOPEDIC SUPPLIES) IMPLANT
KIT BASIN OR (CUSTOM PROCEDURE TRAY) ×2 IMPLANT
KIT STIMULAN RAPID CURE  10CC (Orthopedic Implant) ×1 IMPLANT
KIT STIMULAN RAPID CURE 10CC (Orthopedic Implant) IMPLANT
MANIFOLD NEPTUNE II (INSTRUMENTS) ×2 IMPLANT
PACK LOWER EXTREMITY WL (CUSTOM PROCEDURE TRAY) ×1 IMPLANT
PACK TOTAL JOINT (CUSTOM PROCEDURE TRAY) ×1 IMPLANT
PADDING CAST COTTON 6X4 STRL (CAST SUPPLIES) ×2 IMPLANT
POSITIONER SURGICAL ARM (MISCELLANEOUS) ×1 IMPLANT
SET HNDPC FAN SPRY TIP SCT (DISPOSABLE) ×1 IMPLANT
STAPLER VISISTAT 35W (STAPLE) ×1 IMPLANT
STRIP CLOSURE SKIN 1/2X4 (GAUZE/BANDAGES/DRESSINGS) ×2 IMPLANT
SUT MNCRL AB 4-0 PS2 18 (SUTURE) ×1 IMPLANT
SUT PDS AB 0 CT1 36 (SUTURE) ×2 IMPLANT
SUT PDS AB 2-0 CT2 27 (SUTURE) ×2 IMPLANT
SUT VIC AB 2-0 CT1 27 (SUTURE)
SUT VIC AB 2-0 CT1 TAPERPNT 27 (SUTURE) ×3 IMPLANT
SUT VLOC 180 0 24IN GS25 (SUTURE) ×1 IMPLANT
SWAB COLLECTION DEVICE MRSA (MISCELLANEOUS) ×2 IMPLANT
TOWEL OR 17X26 10 PK STRL BLUE (TOWEL DISPOSABLE) ×4 IMPLANT
TUBE ANAEROBIC SPECIMEN COL (MISCELLANEOUS) ×2 IMPLANT
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
## 2014-06-17 NOTE — Anesthesia Preprocedure Evaluation (Addendum)
Anesthesia Evaluation  Patient identified by MRN, date of birth, ID band Patient awake    Reviewed: Allergy & Precautions, H&P , NPO status , Patient's Chart, lab work & pertinent test results  Airway Mallampati: II TM Distance: >3 FB Neck ROM: Full    Dental no notable dental hx.    Pulmonary neg pulmonary ROS,  breath sounds clear to auscultation  Pulmonary exam normal       Cardiovascular negative cardio ROS  Rhythm:Regular Rate:Normal     Neuro/Psych negative neurological ROS  negative psych ROS   GI/Hepatic negative GI ROS, Neg liver ROS,   Endo/Other  negative endocrine ROS  Renal/GU negative Renal ROS  negative genitourinary   Musculoskeletal negative musculoskeletal ROS (+)   Abdominal   Peds negative pediatric ROS (+)  Hematology negative hematology ROS (+)   Anesthesia Other Findings   Reproductive/Obstetrics negative OB ROS                           Anesthesia Physical Anesthesia Plan  ASA: II  Anesthesia Plan: General   Post-op Pain Management:    Induction: Intravenous  Airway Management Planned: LMA  Additional Equipment:   Intra-op Plan:   Post-operative Plan: Extubation in OR  Informed Consent: I have reviewed the patients History and Physical, chart, labs and discussed the procedure including the risks, benefits and alternatives for the proposed anesthesia with the patient or authorized representative who has indicated his/her understanding and acceptance.   Dental advisory given  Plan Discussed with: CRNA  Anesthesia Plan Comments:         Anesthesia Quick Evaluation  

## 2014-06-17 NOTE — Treatment Plan (Signed)
Non invasive vascular - Left lower extremity venous duplex placed on hold per Dr. Ramiro Harvest, MD. until patient returns from surgery and is reevaluated. Brian Atkinson, RVS.

## 2014-06-17 NOTE — Brief Op Note (Signed)
06/16/2014 - 06/17/2014  12:08 PM  PATIENT:  Brian Atkinson  63 y.o. male  PRE-OPERATIVE DIAGNOSIS:  1. Infected left knee surgical wound  POST-OPERATIVE DIAGNOSIS:  1. Infected left knee surgical wound 2. Infected left knee joint  PROCEDURE:  Procedure(s): 1. IRRIGATION AND DEBRIDEMENT KNEE (Left) wound 2. Arthrotomy and lavage of left knee joint 3. Application of small wound vac  SURGEON:  Surgeon(s) and Role:    * Budd Palmer, MD - Primary  PHYSICIAN ASSISTANT: None  ANESTHESIA:   general  I/O:  Total I/O In: 1000 [I.V.:1000] Out: 200 [Urine:200]  SPECIMEN:  Source of Specimen:  left knee wound  DISPOSITION OF SPECIMEN:  To micro  FINDINGS: Gram positive cocci in clusters  TOURNIQUET:  * No tourniquets in log *  DICTATION: .Other Dictation: Dictation Number [id number]

## 2014-06-17 NOTE — Anesthesia Postprocedure Evaluation (Signed)
  Anesthesia Post-op Note  Patient: Brian Atkinson  Procedure(s) Performed: Procedure(s) (LRB): IRRIGATION AND DEBRIDEMENT KNEE (Left)  Patient Location: PACU  Anesthesia Type: General  Level of Consciousness: awake and alert   Airway and Oxygen Therapy: Patient Spontanous Breathing  Post-op Pain: mild  Post-op Assessment: Post-op Vital signs reviewed, Patient's Cardiovascular Status Stable, Respiratory Function Stable, Patent Airway and No signs of Nausea or vomiting  Last Vitals:  Filed Vitals:   06/17/14 1427  BP: 122/78  Pulse: 103  Temp: 37.1 C  Resp: 15    Post-op Vital Signs: stable   Complications: No apparent anesthesia complications

## 2014-06-17 NOTE — Progress Notes (Signed)
ANTIBIOTIC CONSULT NOTE - INITIAL  Pharmacy Consult for vancomycin, Zosyn Indication: L knee infection  No Known Allergies  Patient Measurements: Height:  (180.3 cm) Weight: 225 lb (102.059 kg) IBW/kg (Calculated) : 75.3  Vital Signs: Temp: 97.6 F (36.4 C) (09/09 0432) BP: 143/73 mmHg (09/08 2300) Pulse Rate: 110 (09/09 0432) Intake/Output from previous day: 09/08 0701 - 09/09 0700 In: 1120 [P.O.:120] Out: 525 [Urine:525] Intake/Output from this shift: Total I/O In: -  Out: 200 [Urine:200]  Labs:  Recent Labs  06/17/14 0425  WBC 24.2*  HGB 12.4*  PLT 276  CREATININE 0.89   Estimated Creatinine Clearance: 104.7 ml/min (by C-G formula based on Cr of 0.89). No results found for this basename: VANCOTROUGH, VANCOPEAK, VANCORANDOM, GENTTROUGH, GENTPEAK, GENTRANDOM, TOBRATROUGH, TOBRAPEAK, TOBRARND, AMIKACINPEAK, AMIKACINTROU, AMIKACIN,  in the last 72 hours   Microbiology: Recent Results (from the past 720 hour(s))  CULTURE, BLOOD (ROUTINE X 2)     Status: None   Collection Time    06/16/14 12:50 PM      Result Value Ref Range Status   Specimen Description BLOOD LEFT WRIST   Final   Special Requests BOTTLES DRAWN AEROBIC ONLY 5CC   Final   Culture  Setup Time     Final   Value: 06/16/2014 17:08     Performed at [facility]   Culture     Final   Value:        BLOOD CULTURE RECEIVED NO GROWTH TO DATE CULTURE WILL BE HELD FOR 5 DAYS BEFORE ISSUING A FINAL NEGATIVE REPORT     Performed at [facility]   Report Status PENDING   Incomplete  CULTURE, BLOOD (ROUTINE X 2)     Status: None   Collection Time    06/16/14 12:50 PM      Result Value Ref Range Status   Specimen Description BLOOD RIGHT ARM   Final   Special Requests BOTTLES DRAWN AEROBIC AND ANAEROBIC 6CC   Final   Culture  Setup Time     Final   Value: 06/16/2014 17:07     Performed at [facility]   Culture     Final   Value:        BLOOD CULTURE RECEIVED NO GROWTH TO DATE  CULTURE WILL BE HELD FOR 5 DAYS BEFORE ISSUING A FINAL NEGATIVE REPORT     Performed at [facility]   Report Status PENDING   Incomplete    Medical History: Past Medical History  Diagnosis Date  . Medical history non-contributory     Medications:  Scheduled:  . [facility] HOLD] docusate sodium  100 mg Oral BID  . [MAR HOLD] enoxaparin (LOVENOX) injection  40 mg Subcutaneous Q24H  . [facility] HOLD] methocarbamol  1,000 mg Oral QID   Infusions:  . sodium chloride 0.9 % 1,000 mL infusion     Assessment: 63 yo male presented to ER with probably left knee infection/questionable septic arthritis s/p recent ORIF of left patellar fracture 9/4. Went to OR for I&D and now starting vancomycin and Zosyn per pharmacy  9/9 >> vancomycin >> 9/9 >> Zosyn >>   Tmax 102.7  WBC 24.2  SCr 0.89, CrCl N 87  Cultures: 9/8 blood: pending 9/8 urine: pending 9/9 left knee: pending  Goal of Therapy:  Vancomycin trough level 15-20 mcg/ml  Plan:  1) Vancomycin 2g IV x 1 then 1g IV q12 2) Zosyn 3.375g IV q8 (extended interval infusion)   Hessie Knows, PharmD,  BCPS Pager [phone] 06/17/2014 10:20 AM

## 2014-06-17 NOTE — Progress Notes (Signed)
I discussed with the patient the risks and benefits of surgery for I&D of left knee, including the possibility of infection, nerve injury, vessel injury, wound breakdown, arthritis, symptomatic hardware, DVT/ PE, loss of motion, and need for further surgery among others.  He understood these risks and wished to proceed.  Myrene Galas, MD [facility] [phone] (p)

## 2014-06-17 NOTE — Op Note (Signed)
NAMEFRIEDRICH, [name]                ACCOUNT NO.:  [id number]  MEDICAL RECORD NO.:  [id number]  LOCATION:  1606                         FACILITY:  [facility]  PHYSICIAN:  Doralee Albino. Carola Frost, M.D. DATE OF BIRTH:  11-20-1950  DATE OF PROCEDURE:  06/17/2014 DATE OF DISCHARGE:                              OPERATIVE REPORT   PREOPERATIVE DIAGNOSIS:  Left surgical wound infection.  POSTOPERATIVE DIAGNOSES: 1. Infected left knee surgical wound. 2. Infected left knee joint.  PROCEDURE: 1. Incision and drainage of left knee wound. 2. Arthrotomy and lavage of left knee joint. 3. Placement of antibiotic beads 4. Application of small wound VAC.  SURGEON:  Doralee Albino. Carola Frost, M.D.  ASSISTANT:  None.  ANESTHESIA:  General.  COMPLICATIONS:  None.  I/O:  1000 mL of crystalloid/200 mL UOP.  SPECIMENS:  Two anaerobic, aerobic cultures sent to micro from the left knee wound.  FINDINGS:  Gram-positive cocci in clusters.  DRAINS:  Two Hemovac within the joint and a wound VAC of the left knee wound.  DISPOSITION:  To PACU.  CONDITION:  Stable.  BRIEF SUMMARY OF INDICATION FOR PROCEDURE:  Brian Atkinson is a 63 year old male status post ORIF of a distal pole patella fracture treated with excision and primary repair of the tendon to the patella using Krackow stitch and multiple fiber wires.  The patient did have a small seemingly stable traumatic wound over the anterior aspect of the knee.  He has had several days of progressive pain and became febrile.  This morning, he began to drain purulence from his wound.  We discussed with him the risks and benefits of surgical debridement including the possibility of loss of integrity of his repair and the need for revision, nerve injury, vessel injury, DVT, PE, persistent infection, arthritis, and many others.  He understood these risks and did wish to proceed.  BRIEF SUMMARY OF PROCEDURE:  Brian Atkinson was not given any antibiotics so that we could  obtain adequate cultures.  He was taken to the operating room where general anesthesia was induced.  Standard prep and drape was performed.  Time-out held and then the sutures removed.  This immediately allowed further egress of purulence that initially appeared to be superficial along both sides of the retinaculum anterior to the knee joint.  I then made an arthrotomy along the lateral aspect of the knee joint when squeezing out the suprapatellar pouch did reveal some purulence creeping into the field.  With the arthrotomy, further egress of purulence was identified from the knee joint.  I passed a curette into the suprapatellar pouch and scraped the lining of the synovium.  I was very careful to avoid any of the articular surface.  I did this in the medial and lateral gutters as well.  I also scraped along the retinaculum superficial to the knee joint.  Most of the infection was in this area.  I then irrigated with 6000 mL of high volume low pressure saline and took the knee through a range of motion throughout the lavage to facilitate removal of all infectious material.  I irrigated the superficial tissues in like manner.  I did evaluate the repair directly and did  not palpate any significant loosening of the repair.  The integrity of the tendon within the Krackow stitches appeared well preserved.  I also obtained C-arm AP image and then under the lateral image carefully watched the repair as I brought him from full extension to 90 degrees of flexion with no slippage or diastasis there as well. Knee was then brought back into full extension.  I did place Stimulan antibiotic beads containing vancomycin and tobramycin within the lateral gutter of the arthrotomy and also superficially along the retinaculum and tendon both medially and laterally and in the suprapatellar pouch. Lastly I placed a wound VAC and prior to closing the arthrotomy had placed a medium Hemovac within the  joint.  Brian Atkinson will return to the OR again in 48 hours for removal of the [facility] and closure.  At that time, I will anticipate discharge to home in a knee immobilizer or locked hinged knee brace.  We will plan to see him back in the office for reassessment in 10 days.  Because I will be out of town later this week, I will need to have a colleague assist me with closure using PDS and nylon.  We will obtain PICC line in the interim and we will follow the sensitivities with tapering off the antibiotics appropriately.  I would expect to retain his sutures until he goes on to unite with possible elective removal sometime after 3-4 months depending on examination and progress, and he may require suppressive antibiotics until then.  We will obtain a sed rate and CRP as a baseline.  He is at elevated risk for loss of the reduction and nonunion as well as persistent infection and early onset arthritis as a result of this complication.  This has been discussed with the patient and his family.     Doralee Albino. Carola Frost, M.D.     MHH/MEDQ  D:  06/17/2014  T:  06/17/2014  Job:  [id number]

## 2014-06-17 NOTE — Consult Note (Signed)
I have seen and examined the patient. I agree with the findings above.  Budd Palmer, MD 06/17/2014 10:11 AM
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
## 2014-06-17 NOTE — Transfer of Care (Signed)
Immediate Anesthesia Transfer of Care Note  Patient: Brian Atkinson  Procedure(s) Performed: Procedure(s): IRRIGATION AND DEBRIDEMENT KNEE (Left)  Patient Location: PACU  Anesthesia Type:General  Level of Consciousness: awake, alert , oriented and patient cooperative  Airway & Oxygen Therapy: Patient Spontanous Breathing and Patient connected to face mask oxygen  Post-op Assessment: Report given to PACU RN, Post -op Vital signs reviewed and stable and Patient moving all extremities  Post vital signs: Reviewed and stable  Complications: No apparent anesthesia complications
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
## 2014-06-17 NOTE — Progress Notes (Signed)
I have seen and examined the patient. I agree with the findings above.  Diezel Mazur H, MD 06/17/2014 10:11 AM   

## 2014-06-17 NOTE — Progress Notes (Signed)
[facility] PROGRESS NOTE  Brian Atkinson ZOX:[id number] DOB: 1950-12-24 DOA: 06/16/2014 PCP: No PCP Per Patient  Assessment/Plan: #1 sepsis Patient noted to have a temp of 102.7 last night with heart rate as high as 117. CBC with a white count of 24.2. Patient status post ORIF for left patellar fracture on 06/12/2014 now presenting with left knee swelling, erythema, warmth, tenderness to palpation with noted purulent drainage. Blood cultures are pending. Urinalysis was negative. Patient has been assessed by orthopedics and patient is scheduled to go to the operating room today for I and D with wound cultures. Concern for septic knee. Will place patient empirically on IV vancomycin IV Zosyn postoperatively. Follow.  #2 probable left knee infection/questionable septic arthritis Patient with recent ORIF of the left patellar 06/12/2014 presenting with left knee pain, swelling, erythema, tenderness to palpation. Patient noted to be febrile with temp as high as 102.7 last night. Patient has been assessed by orthopedics and patient to the operating room today for I and D and wound cultures. Postoperatively we'll place empirically on IV vancomycin IV Zosyn. Blood cultures pending. Follow.  #3 fever/leukocytosis Likely secondary to problem #2. Blood cultures are pending. Urinalysis was negative. Postoperatively once wound cultures have been obtained we'll place empirically on IV vancomycin IV Zosyn.  #4 hyponatremia Likely secondary to hypovolemic hyponatremia. Place on IV fluids. Follow.  #5 uncontrolled pain Pain management.  #6 elevated blood pressure Likely secondary to pain. Patient not on antihypertensives at home. We'll monitor for now. Pain management.  #7 prophylaxis Lovenox for DVT prophylaxis.  Code Status: Full Family Communication: Updated patient and wife at bedside. Disposition Plan: Pending surgery.   Consultants:  Orthopedics: Montez Morita, PA  06/16/2014  Procedures:  Chest x-ray 06/16/2014    Antibiotics:  IV vancomycin to be started postop 06/17/2014  IV Zosyn to be started postop 06/17/2014  HPI/Subjective: Patient complaining of left leg pain. Per nursing dressing around the left knee were saturated with purulent drainage once immobilizer was removed. Patient noted to be afebrile overnight and tachycardic.  Objective: Filed Vitals:   06/17/14 0432  BP:   Pulse: 110  Temp: 97.6 F (36.4 C)  Resp:     Intake/Output Summary (Last 24 hours) at 06/17/14 0924 Last data filed at 06/17/14 0412  Gross per 24 hour  Intake   1120 ml  Output    525 ml  Net    595 ml   Filed Weights   06/16/14 1100  Weight: 102.059 kg (225 lb)    Exam:   General:  NAD  Cardiovascular: Tachycardia, RR.  Respiratory: Clear to auscultation bilaterally  Abdomen: Soft, nontender, nondistended, positive bowel sounds.  Musculoskeletal: No clubbing cyanosis. Left knee exquisitely tender to palpation, erythematous, warmth, knee and distal thigh tight. Purulent drainage from wound noted per nursing.  Data Reviewed: Basic Metabolic Panel:  Recent Labs Lab 06/17/14 0425  NA 129*  K 4.1  CL 92*  CO2 24  GLUCOSE 109*  BUN 9  CREATININE 0.89  CALCIUM 8.6   Liver Function Tests:  Recent Labs Lab 06/17/14 0425  AST 13  ALT 10  ALKPHOS 74  BILITOT 1.0  PROT 7.3  ALBUMIN 2.7*   No results found for this basename: LIPASE, AMYLASE,  in the last 168 hours No results found for this basename: AMMONIA,  in the last 168 hours CBC:  Recent Labs Lab 06/12/14 0644 06/17/14 0425  WBC 7.1 24.2*  HGB 13.3 12.4*  HCT 39.3 37.5*  MCV 89.1 90.8  PLT 227 276   Cardiac Enzymes: No results found for this basename: CKTOTAL, CKMB, CKMBINDEX, TROPONINI,  in the last 168 hours BNP (last 3 results) No results found for this basename: PROBNP,  in the last 8760 hours CBG: No results found for this basename: GLUCAP,  in the last  168 hours  Recent Results (from the past 240 hour(s))  CULTURE, BLOOD (ROUTINE X 2)     Status: None   Collection Time    06/16/14 12:50 PM      Result Value Ref Range Status   Specimen Description BLOOD LEFT WRIST   Final   Special Requests BOTTLES DRAWN AEROBIC ONLY 5CC   Final   Culture  Setup Time     Final   Value: 06/16/2014 17:08     Performed at [facility]   Culture     Final   Value:        BLOOD CULTURE RECEIVED NO GROWTH TO DATE CULTURE WILL BE HELD FOR 5 DAYS BEFORE ISSUING A FINAL NEGATIVE REPORT     Performed at [facility]   Report Status PENDING   Incomplete  CULTURE, BLOOD (ROUTINE X 2)     Status: None   Collection Time    06/16/14 12:50 PM      Result Value Ref Range Status   Specimen Description BLOOD RIGHT ARM   Final   Special Requests BOTTLES DRAWN AEROBIC AND ANAEROBIC 6CC   Final   Culture  Setup Time     Final   Value: 06/16/2014 17:07     Performed at [facility]   Culture     Final   Value:        BLOOD CULTURE RECEIVED NO GROWTH TO DATE CULTURE WILL BE HELD FOR 5 DAYS BEFORE ISSUING A FINAL NEGATIVE REPORT     Performed at [facility]   Report Status PENDING   Incomplete     Studies: Dg Chest Port 1 View  06/16/2014   CLINICAL DATA:  Postoperative fever  EXAM: PORTABLE CHEST - 1 VIEW  COMPARISON:  Portable exam 1222 hr without priors for comparison.  FINDINGS: Normal heart size, mediastinal contours and pulmonary vascularity.  Lungs clear.  No pleural effusion or pneumothorax.  Bones unremarkable.  IMPRESSION: No acute abnormalities.   Electronically Signed   By: Ulyses Southward M.D.   On: 06/16/2014 12:58    Scheduled Meds: . [facility] HOLD] docusate sodium  100 mg Oral BID  . [MAR HOLD] enoxaparin (LOVENOX) injection  40 mg Subcutaneous Q24H  . [facility] HOLD] methocarbamol  1,000 mg Oral QID   Continuous Infusions:   Principal Problem:   Sepsis Active Problems:   Bacterial infection of knee joint: Probable    Hyponatremia   Uncontrolled pain   Fever, unspecified    Time spent: 35 mins    [facility] MD [facility] Pager [phone]. If 7PM-7AM, please contact night-coverage at [email or web address], password [facility] 06/17/2014, 9:24 AM  LOS: 1 day

## 2014-06-18 ENCOUNTER — Ambulatory Visit: Payer: Self-pay | Admitting: Orthopedic Surgery

## 2014-06-18 ENCOUNTER — Encounter (HOSPITAL_COMMUNITY): Payer: Self-pay | Admitting: Orthopedic Surgery

## 2014-06-18 ENCOUNTER — Inpatient Hospital Stay (HOSPITAL_COMMUNITY): Payer: BC Managed Care – PPO

## 2014-06-18 DIAGNOSIS — D509 Iron deficiency anemia, unspecified: Secondary | ICD-10-CM | POA: Diagnosis present

## 2014-06-18 DIAGNOSIS — D62 Acute posthemorrhagic anemia: Secondary | ICD-10-CM | POA: Diagnosis not present

## 2014-06-18 DIAGNOSIS — D649 Anemia, unspecified: Secondary | ICD-10-CM | POA: Diagnosis present

## 2014-06-18 DIAGNOSIS — M009 Pyogenic arthritis, unspecified: Secondary | ICD-10-CM | POA: Diagnosis present

## 2014-06-18 LAB — CBC WITH DIFFERENTIAL/PLATELET
BASOS PCT: 0 % (ref 0–1)
Basophils Absolute: 0 10*3/uL (ref 0.0–0.1)
Eosinophils Absolute: 0.1 10*3/uL (ref 0.0–0.7)
Eosinophils Relative: 1 % (ref 0–5)
HCT: 29.6 % — ABNORMAL LOW (ref 39.0–52.0)
HEMOGLOBIN: 10 g/dL — AB (ref 13.0–17.0)
LYMPHS ABS: 1.2 10*3/uL (ref 0.7–4.0)
LYMPHS PCT: 6 % — AB (ref 12–46)
MCH: 30.3 pg (ref 26.0–34.0)
MCHC: 33.8 g/dL (ref 30.0–36.0)
MCV: 89.7 fL (ref 78.0–100.0)
MONO ABS: 1.4 10*3/uL — AB (ref 0.1–1.0)
MONOS PCT: 8 % (ref 3–12)
NEUTROS ABS: 16.1 10*3/uL — AB (ref 1.7–7.7)
NEUTROS PCT: 86 % — AB (ref 43–77)
Platelets: 271 10*3/uL (ref 150–400)
RBC: 3.3 MIL/uL — AB (ref 4.22–5.81)
RDW: 12.5 % (ref 11.5–15.5)
WBC: 18.8 10*3/uL — ABNORMAL HIGH (ref 4.0–10.5)

## 2014-06-18 LAB — BASIC METABOLIC PANEL
ANION GAP: 10 (ref 5–15)
BUN: 10 mg/dL (ref 6–23)
CHLORIDE: 96 meq/L (ref 96–112)
CO2: 26 meq/L (ref 19–32)
CREATININE: 0.96 mg/dL (ref 0.50–1.35)
Calcium: 8.1 mg/dL — ABNORMAL LOW (ref 8.4–10.5)
GFR calc Af Amer: >90 mL/min (ref >90)
GFR calc non Af Amer: 87 mL/min — ABNORMAL LOW (ref >90)
Glucose, Bld: 112 mg/dL — ABNORMAL HIGH (ref 70–99)
POTASSIUM: 4 meq/L (ref 3.7–5.3)
Sodium: 132 mEq/L — ABNORMAL LOW (ref 137–147)

## 2014-06-18 LAB — IRON AND TIBC
Iron: <10 ug/dL — ABNORMAL LOW (ref 42–135)
UIBC: 119 ug/dL — AB (ref 125–400)

## 2014-06-18 LAB — URINE CULTURE
COLONY COUNT: NO GROWTH
CULTURE: NO GROWTH

## 2014-06-18 LAB — FOLATE: FOLATE: 6.6 ng/mL

## 2014-06-18 LAB — MAGNESIUM: Magnesium: 1.9 mg/dL (ref 1.5–2.5)

## 2014-06-18 LAB — HEMOGLOBIN A1C
HEMOGLOBIN A1C: 5.6 % (ref <5.7)
MEAN PLASMA GLUCOSE: 114 mg/dL (ref <117)

## 2014-06-18 LAB — VITAMIN B12: VITAMIN B 12: 310 pg/mL (ref 211–911)

## 2014-06-18 LAB — FERRITIN: FERRITIN: 427 ng/mL — AB (ref 22–322)

## 2014-06-18 MED ORDER — NA FERRIC GLUC CPLX IN SUCROSE 12.5 MG/ML IV SOLN
125.0000 mg | Freq: Once | INTRAVENOUS | Status: AC
  Administered 2014-06-18: 125 mg via INTRAVENOUS
  Filled 2014-06-18: qty 10

## 2014-06-18 MED ORDER — SENNOSIDES-DOCUSATE SODIUM 8.6-50 MG PO TABS
2.0000 | ORAL_TABLET | Freq: Every day | ORAL | Status: DC
  Administered 2014-06-18 – 2014-06-21 (×2): 2 via ORAL
  Filled 2014-06-18 (×6): qty 2

## 2014-06-18 MED ORDER — POLYETHYLENE GLYCOL 3350 17 G PO PACK
17.0000 g | PACK | Freq: Two times a day (BID) | ORAL | Status: DC
  Administered 2014-06-18 – 2014-06-22 (×4): 17 g via ORAL

## 2014-06-18 MED ORDER — MAGNESIUM CITRATE PO SOLN
0.5000 | Freq: Once | ORAL | Status: AC
  Administered 2014-06-18: 1 via ORAL

## 2014-06-18 MED ORDER — OXYCODONE HCL 5 MG PO TABS
5.0000 mg | ORAL_TABLET | ORAL | Status: DC | PRN
  Administered 2014-06-19: 5 mg via ORAL
  Filled 2014-06-18: qty 1

## 2014-06-18 MED ORDER — SORBITOL 70 % SOLN
30.0000 mL | Freq: Once | Status: AC
  Administered 2014-06-19: 30 mL via ORAL
  Filled 2014-06-18 (×2): qty 30

## 2014-06-18 MED ORDER — KETOROLAC TROMETHAMINE 15 MG/ML IJ SOLN
15.0000 mg | Freq: Four times a day (QID) | INTRAMUSCULAR | Status: DC | PRN
  Administered 2014-06-19: 15 mg via INTRAVENOUS
  Filled 2014-06-18 (×2): qty 1

## 2014-06-18 MED ORDER — KETOROLAC TROMETHAMINE 30 MG/ML IJ SOLN
15.0000 mg | Freq: Four times a day (QID) | INTRAMUSCULAR | Status: DC | PRN
  Filled 2014-06-18: qty 1

## 2014-06-18 MED ORDER — POLYETHYLENE GLYCOL 3350 17 G PO PACK: 17.0000 g | PACK | Freq: Every day | ORAL | Status: DC

## 2014-06-18 NOTE — Progress Notes (Signed)
[facility] Progress Note  Subjective  Doing better Pain improved Wants to get up and move around  + flatus No BM in about 1 1/2 weeks Voiding well  No CP or SOB L ribs feel better   Review of Systems  Constitutional: Positive for fever. Negative for chills.  Respiratory: Negative for shortness of breath and wheezing.   Cardiovascular: Negative for chest pain and palpitations.  Gastrointestinal: Positive for constipation. Negative for nausea, vomiting and abdominal pain.  Genitourinary: Negative for dysuria.  Neurological: Negative for tingling, sensory change and headaches.      Objective   BP 106/61  Pulse 104  Temp(Src) 100.8 F (38.2 C) (Oral)  Resp 20  Ht  (1.803 m)  Wt 102.059 kg (225 lb)  BMI 31.39 kg/m2  SpO2 96%  Intake/Output     09/09 0701 - 09/10 0700 09/10 0701 - 09/11 0700   P.O. 1320    I.V. (mL/kg) 3341.3 (32.7)    Other     IV Piggyback 400    Total Intake(mL/kg) 5061.3 (49.6)    Urine (mL/kg/hr) 3300 (1.3) 300 (1.4)   Drains 5 (0)    Blood 10 (0)    Total Output 3315 300   Net +1746.3 -300          Labs Results for ZAUL, HUBERS (MRN [id number]) as of 06/18/2014 09:07  Ref. Range 06/18/2014 04:18  Sodium Latest Range: 137-147 mEq/L 132 (L)  Potassium Latest Range: 3.7-5.3 mEq/L 4.0  Chloride Latest Range: 96-112 mEq/L 96  CO2 Latest Range: 19-32 mEq/L 26  BUN Latest Range: 6-23 mg/dL 10  Creatinine Latest Range: 0.50-1.35 mg/dL 4.09  Calcium Latest Range: 8.4-10.5 mg/dL 8.1 (L)  GFR calc non Af Amer Latest Range: >90 mL/min 87 (L)  GFR calc Af Amer Latest Range: >90 mL/min >90  Glucose Latest Range: 70-99 mg/dL 811 (H)  Anion gap Latest Range: 5-15  10  Magnesium Latest Range: 1.5-2.5 mg/dL 1.9  WBC Latest Range: 4.0-10.5 K/uL 18.8 (H)  RBC Latest Range: 4.22-5.81 MIL/uL 3.30 (L)  Hemoglobin Latest Range: 13.0-17.0 g/dL 91.4 (L)  HCT Latest Range: 39.0-52.0 % 29.6 (L)  MCV Latest Range: 78.0-100.0 fL 89.7   MCH Latest Range: 26.0-34.0 pg 30.3  MCHC Latest Range: 30.0-36.0 g/dL 78.2  RDW Latest Range: 11.5-15.5 % 12.5  Platelets Latest Range: 150-400 K/uL 271  Neutrophils Relative % Latest Range: 43-77 % 86 (H)  Lymphocytes Relative Latest Range: 12-46 % 6 (L)  Monocytes Relative Latest Range: 3-12 % 8  Eosinophils Relative Latest Range: 0-5 % 1  Basophils Relative Latest Range: 0-1 % 0  NEUT# Latest Range: 1.7-7.7 K/uL 16.1 (H)  Lymphocytes Absolute Latest Range: 0.7-4.0 K/uL 1.2  Monocytes Absolute Latest Range: 0.1-1.0 K/uL 1.4 (H)  Eosinophils Absolute Latest Range: 0.0-0.7 K/uL 0.1  Basophils Absolute Latest Range: 0.0-0.1 K/uL 0.0     1d ago     Specimen Description FLUID LEFT KNEE       Special Requests NONE       Gram Stain ABUNDANT WBC PRESENT, PREDOMINANTLY PMN FEW GRAM POSITIVE COCCI IN CLUSTERS Performed by [facility] Performed at [facility]      Culture Culture reincubated for better growth Note: Gram Stain Report Called to,Read Back By and Verified With: DR HANDY AT 1135 ON 09.09.15 BY SHUEA Performed at [facility]       Exam  Gen: awake and alert, eating breakfast, NAD, appears well Lungs: clear anterior fields Cardiac: s1  and s2, Reg Abd: mild distension, + BS, NT Ext:       Left lower Extremity   Dressing c/d/i  VAC functioning, sanguinous fluid  Hemovac functioning with sanguinous fluid as well  Distal motor and sensory functions intact  Ext warm  + DP pulse    Assessment and Plan   POD/HD#: 1  63 y/o male s/p I&D L knee for septic joint following open repair of distal pole of patella  1. Septic L knee s/p I&D  Pt on zosyn and vanc  ID consult pending  Return to OR tomorrow for repeat I&D and closure   2. ORIF L patella and extensor mechanism  WBAT with L knee in full extension and knee immobilizer on  Ice and elevate  PT eval  Ordered hinged brace- this will not be put on the pt until his office follow  up    No hardware is in the knee but there is braided suture  3. Fever/elevated WBC count  Continue to monitor  WBC trending down  4. Hyponatremia  Improved    5. Pain control  Continue with current meds  6. ID  ID consult pending  Have not ordered PICC yet, defer to ID  Await finalized cultures to tailor regimen accordingly   7. DVT/PE prophylaxis  Lovenox   8. FEN  Constipation   mag citrate    miralax   9. dispo  OR tomorrow for repeat I&D  Likely ready for dc Sunday    Brian Latin, PA-C [facility] [phone] (P) 06/18/2014 9:05 AM  **Disclaimer: This note may have been dictated with voice recognition software. Similar sounding words can inadvertently be transcribed and this note may contain transcription errors which may not have been corrected upon publication of note.**
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
## 2014-06-18 NOTE — Progress Notes (Signed)
[facility] PROGRESS NOTE  Brian Atkinson ZOX:[id number] DOB: 1950/12/21 DOA: 06/16/2014 PCP: No PCP Per Patient  Assessment/Plan:  #1 septic left knee joint Patient with recent ORIF of the left patellar 06/12/2014 presented with left knee pain, swelling, erythema, tenderness to palpation. Patient noted to be febrile with temp as high as 102.7 on the day of admission. Patient is status post I and D with placement of wound VAC on 06/17/2014. Patient likely to return to the operating room tomorrow for repeat irrigation/[place] of the left knee and hopefully closure of his wound. Preliminary wound cultures growing gram-positive cocci in clusters. Continue empiric IV vancomycin and IV Zosyn. Will consult with ID for further evaluation and management.  #2 sepsis Secondary to problem #1. Patient noted to have a Tmax of 101.4 last night with heart rate as high as 110. CBC with a white count of 24.2 on admission and now 18.8. Patient status post ORIF for left patellar fracture on 06/12/2014 now presenting with left knee swelling, erythema, warmth, tenderness to palpation with noted purulent drainage. Blood cultures are pending. Urinalysis was negative. Patient has been assessed by orthopedics and patient s/p I and D with placement of wound vac 06/17/14. With cultures pending with preliminary cultures growing gram-positive cocci in clusters.  Continue empiric IV vancomycin IV Zosyn. ID consultation pending for  Further evaluation and management.   #3 fever/leukocytosis Likely secondary to problem #1 and 2. Blood cultures are pending. Urinalysis was negative. Preliminary wound cultures with gram-positive cocci in clusters.  Patient is status post I and a D with placement of wound VAC. Continue empiric IV vancomycin and IV Zosyn. Will consult with ID for further evaluation and management.  #4 acute blood loss anemia/postoperative acute blood loss anemia Check an anemia panel.  Follow H&H. Transfusion  threshold hemoglobin less than 7.  #5 hyponatremia Likely secondary to hypovolemic hyponatremia. Improved with hydration. Follow.  #6 uncontrolled pain Pain management.  #7 elevated blood pressure Likely secondary to pain. Patient not on antihypertensives at home. Will monitor for now. Pain management.  #8 prophylaxis Lovenox for DVT prophylaxis.  Code Status: Full Family Communication: Updated patient and wife at bedside. Disposition Plan: probably SNF when medically stable   Consultants:  Orthopedics: Montez Morita, PA 06/16/2014  Procedures:  Chest x-ray 06/16/2014  X-ray of the left knee 06/17/2014  I and D of left knee wound/Arthrotomy and lavage of left knee joint/aappreciation of small wound VAC 06/17/2014 Dr. Carola Frost Antibiotics:  IV vancomycin 06/17/2014  IV Zosyn  06/17/2014  HPI/Subjective: Patient states left lower extremity pain improving.  Objective: Filed Vitals:   06/18/14 0951  BP: 118/72  Pulse: 97  Temp: 100.4 F (38 C)  Resp: 18    Intake/Output Summary (Last 24 hours) at 06/18/14 1049 Last data filed at 06/18/14 0952  Gross per 24 hour  Intake 4061.25 ml  Output   3715 ml  Net 346.25 ml   Filed Weights   06/16/14 1100  Weight: 102.059 kg (225 lb)    Exam:   General:  NAD  Cardiovascular:  RRR.  Respiratory: Clear to auscultation bilaterally  Abdomen: Soft, nontender, nondistended, positive bowel sounds.  Musculoskeletal: No clubbing cyanosis. Left knee with bandage and wound vac.   Data Reviewed: Basic Metabolic Panel:  Recent Labs Lab 06/17/14 0425 06/18/14 0418  NA 129* 132*  K 4.1 4.0  CL 92* 96  CO2 24 26  GLUCOSE 109* 112*  BUN 9 10  CREATININE 0.89 0.96  CALCIUM 8.6 8.1*  MG  --  1.9   Liver Function Tests:  Recent Labs Lab 06/17/14 0425  AST 13  ALT 10  ALKPHOS 74  BILITOT 1.0  PROT 7.3  ALBUMIN 2.7*   No results found for this basename: LIPASE, AMYLASE,  in the last 168 hours No results  found for this basename: AMMONIA,  in the last 168 hours CBC:  Recent Labs Lab 06/12/14 0644 06/17/14 0425 06/18/14 0418  WBC 7.1 24.2* 18.8*  NEUTROABS  --   --  16.1*  HGB 13.3 12.4* 10.0*  HCT 39.3 37.5* 29.6*  MCV 89.1 90.8 89.7  PLT 227 276 271   Cardiac Enzymes: No results found for this basename: CKTOTAL, CKMB, CKMBINDEX, TROPONINI,  in the last 168 hours BNP (last 3 results) No results found for this basename: PROBNP,  in the last 8760 hours CBG: No results found for this basename: GLUCAP,  in the last 168 hours  Recent Results (from the past 240 hour(s))  CULTURE, BLOOD (ROUTINE X 2)     Status: None   Collection Time    06/16/14 12:50 PM      Result Value Ref Range Status   Specimen Description BLOOD LEFT WRIST   Final   Special Requests BOTTLES DRAWN AEROBIC ONLY 5CC   Final   Culture  Setup Time     Final   Value: 06/16/2014 17:08     Performed at [facility]   Culture     Final   Value:        BLOOD CULTURE RECEIVED NO GROWTH TO DATE CULTURE WILL BE HELD FOR 5 DAYS BEFORE ISSUING A FINAL NEGATIVE REPORT     Performed at [facility]   Report Status PENDING   Incomplete  CULTURE, BLOOD (ROUTINE X 2)     Status: None   Collection Time    06/16/14 12:50 PM      Result Value Ref Range Status   Specimen Description BLOOD RIGHT ARM   Final   Special Requests BOTTLES DRAWN AEROBIC AND ANAEROBIC 6CC   Final   Culture  Setup Time     Final   Value: 06/16/2014 17:07     Performed at [facility]   Culture     Final   Value:        BLOOD CULTURE RECEIVED NO GROWTH TO DATE CULTURE WILL BE HELD FOR 5 DAYS BEFORE ISSUING A FINAL NEGATIVE REPORT     Performed at [facility]   Report Status PENDING   Incomplete  URINE CULTURE     Status: None   Collection Time    06/16/14  9:44 PM      Result Value Ref Range Status   Specimen Description URINE, CLEAN CATCH   Final   Special Requests NONE   Final   Culture  Setup Time     Final    Value: 06/17/2014 04:49     Performed at [facility] Count     Final   Value: NO GROWTH     Performed at [facility]   Culture     Final   Value: NO GROWTH     Performed at [facility]   Report Status 06/18/2014 FINAL   Final  SURGICAL PCR SCREEN     Status: Abnormal   Collection Time    06/17/14  7:50 AM      Result Value Ref Range Status   MRSA, PCR  INVALID RESULTS, SPECIMEN SENT FOR CULTURE (*) NEGATIVE Final   Comment: RESULT CALLED TO, READ BACK BY AND VERIFIED WITH:     FARGO,A @ 1048 ON 161096 BY POTEAT,S   Staphylococcus aureus INVALID RESULTS, SPECIMEN SENT FOR CULTURE (*) NEGATIVE Final   Comment: RESULT CALLED TO, READ BACK BY AND VERIFIED WITH:     FARGO,A @ 1048 ON [id number] BY POTEAT,S                The Xpert SA Assay (FDA     approved for NASAL specimens     in patients over 75 years of age),     is one component of     a comprehensive surveillance     program.  Test performance has     been validated by [employer] for patients greater     than or equal to 69 year old.     It is not intended     to diagnose infection nor to     guide or monitor treatment.  MRSA CULTURE     Status: None   Collection Time    06/17/14  7:50 AM      Result Value Ref Range Status   Specimen Description NOSE   Final   Special Requests NONE   Final   Culture     Final   Value: Culture reincubated for better growth     Performed at [facility]   Report Status PENDING   Incomplete  ANAEROBIC CULTURE     Status: None   Collection Time    06/17/14 10:47 AM      Result Value Ref Range Status   Specimen Description FLUID LEFT KNEE   Final   Special Requests NONE   Final   Gram Stain     Final   Value: ABUNDANT WBC PRESENT, PREDOMINANTLY PMN     NO SQUAMOUS EPITHELIAL CELLS SEEN     FEW GRAM POSITIVE COCCI     IN CLUSTERS Performed by [facility]     Performed at [facility]   Culture     Final   Value: NO  ANAEROBES ISOLATED; CULTURE IN PROGRESS FOR 5 DAYS     Note: Gram Stain Report Called to,Read Back By and Verified With: DR HANDY AT 1135 ON 09.09.15 BY SHUEA     Performed at [facility]   Report Status PENDING   Incomplete  BODY FLUID CULTURE     Status: None   Collection Time    06/17/14 10:47 AM      Result Value Ref Range Status   Specimen Description FLUID LEFT KNEE   Final   Special Requests NONE   Final   Gram Stain     Final   Value: ABUNDANT WBC PRESENT, PREDOMINANTLY PMN     FEW GRAM POSITIVE COCCI     IN CLUSTERS Performed by [facility]     Performed at [facility]   Culture     Final   Value: Culture reincubated for better growth     Note: Gram Stain Report Called to,Read Back By and Verified With: DR HANDY AT 1135 ON 09.09.15 BY SHUEA     Performed at [facility]   Report Status PENDING   Incomplete  GRAM STAIN     Status: None   Collection Time    06/17/14 10:47 AM      Result Value Ref  Range Status   Specimen Description FLUID LEFT KNEE   Final   Special Requests NONE   Final   Gram Stain     Final   Value: ABUNDANT WBC PRESENT, PREDOMINANTLY PMN     FEW GRAM POSITIVE COCCI IN CLUSTERS     Gram Stain Report Called to,Read Back By and Verified With: DR. HANDY AT 1135 ON 2014-07-03 BY SHUEA   Report Status 07-03-14 FINAL   Final     Studies: Dg Knee 1-2 Views Left  07/03/2014   CLINICAL DATA:  63 year old male with left knee infection. Recent surgery. Initial encounter.  EXAM: LEFT KNEE - 1-2 VIEW  COMPARISON:  06/12/2014.  FINDINGS: Distal third patella fracture with interval increased distraction of fragments on the 2 provided intraoperative fluoroscopic images. No hardware identified.  IMPRESSION: Distal patella fracture with increased distraction of fragments since 06/12/2014.   Electronically Signed   By: Augusto Gamble M.D.   On: 03-Jul-2014 11:43   Dg Chest Port 1 View  06/16/2014   CLINICAL DATA:  Postoperative fever  EXAM:  PORTABLE CHEST - 1 VIEW  COMPARISON:  Portable exam 1222 hr without priors for comparison.  FINDINGS: Normal heart size, mediastinal contours and pulmonary vascularity.  Lungs clear.  No pleural effusion or pneumothorax.  Bones unremarkable.  IMPRESSION: No acute abnormalities.   Electronically Signed   By: Ulyses Southward M.D.   On: 06/16/2014 12:58   Dg Knee Left Port  Jul 03, 2014   CLINICAL DATA:  Status post patellar fracture fixation  EXAM: PORTABLE LEFT KNEE - 1-2 VIEW  COMPARISON:  06/12/2014  FINDINGS: The patellar fracture is again identified. The inferior fracture fragment is slightly displaced with respect to the main body of the patella. Multiple round radiopaque densities are identified consistent with antibiotic beads. A surgical drain is noted in place. No other focal abnormality is seen.   Electronically Signed   By: Alcide Clever M.D.   On: 07/03/14 13:48   Dg C-arm 1-60 Min-no Report  07-03-2014   CLINICAL DATA: left knee   C-ARM 1-60 MINUTES  Fluoroscopy was utilized by the requesting physician.  No radiographic  interpretation.     Scheduled Meds: . docusate sodium  100 mg Oral BID  . enoxaparin (LOVENOX) injection  40 mg Subcutaneous Q24H  . magnesium citrate  0.5-1 Bottle Oral Once  . methocarbamol  1,000 mg Oral QID  . piperacillin-tazobactam (ZOSYN)  IV  3.375 g Intravenous Q8H  . polyethylene glycol  17 g Oral BID  . vancomycin  1,000 mg Intravenous Q12H   Continuous Infusions: . sodium chloride 0.9 % 1,000 mL infusion 100 mL/hr at 2014-07-03 2331    Principal Problem:   Septic joint of left knee joint Active Problems:   Bacterial infection of knee joint, Left knee    Sepsis   Hyponatremia   Uncontrolled pain   Fever, unspecified   Postoperative anemia due to acute blood loss    Time spent: 35 mins    [facility] MD [facility] Pager [phone]. If 7PM-7AM, please contact night-coverage at [email or web address], password [facility] 06/18/2014, 10:49 AM  LOS: 2 days
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
## 2014-06-18 NOTE — Progress Notes (Signed)
Patient ID: Artavius Stearns, male   DOB: 26-Aug-1951, 63 y.o.   MRN: [id number] I have seen and examined Mr. Bonawitz as well as have spoken with Dr. Carola Frost.  Mr. Statler fully understands that we will return to the OR late tomorrow afternoon (9/11) for a repeat irrigation/debridment of his left knee and hopefully closure of his wound.  Currently he has a VAC in place.  He is comfortable.  His WBC is still elevated, but hopefully trending down.
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
## 2014-06-18 NOTE — Progress Notes (Signed)
CSW reviewed chart for d/c planning needs. MD notes possible SNF placement needed at d/c. CSW contacted [facility] and was informed that pt is planning to return home when stable. RNCM will assist with [facility] services, if needed.  Cori Razor LCSW [phone]

## 2014-06-19 ENCOUNTER — Encounter (HOSPITAL_COMMUNITY): Payer: Self-pay | Admitting: Certified Registered"

## 2014-06-19 ENCOUNTER — Encounter (HOSPITAL_COMMUNITY)
Admission: AD | Disposition: A | Discharge status: Home Health | Payer: Self-pay | Source: Other Acute Inpatient Hospital | Attending: Internal Medicine

## 2014-06-19 ENCOUNTER — Encounter (HOSPITAL_COMMUNITY): Payer: BC Managed Care – PPO | Admitting: Certified Registered"

## 2014-06-19 ENCOUNTER — Inpatient Hospital Stay (HOSPITAL_COMMUNITY): Payer: BC Managed Care – PPO | Admitting: Certified Registered"

## 2014-06-19 DIAGNOSIS — A4901 Methicillin susceptible Staphylococcus aureus infection, unspecified site: Secondary | ICD-10-CM

## 2014-06-19 HISTORY — PX: DEBRIDEMENT AND CLOSURE WOUND: SHX5614

## 2014-06-19 LAB — BASIC METABOLIC PANEL
ANION GAP: 12 (ref 5–15)
BUN: 9 mg/dL (ref 6–23)
CO2: 26 mEq/L (ref 19–32)
CREATININE: 0.85 mg/dL (ref 0.50–1.35)
Calcium: 8.1 mg/dL — ABNORMAL LOW (ref 8.4–10.5)
Chloride: 96 mEq/L (ref 96–112)
GFR calc non Af Amer: >90 mL/min (ref >90)
Glucose, Bld: 109 mg/dL — ABNORMAL HIGH (ref 70–99)
Potassium: 3.6 mEq/L — ABNORMAL LOW (ref 3.7–5.3)
Sodium: 134 mEq/L — ABNORMAL LOW (ref 137–147)

## 2014-06-19 LAB — CBC WITH DIFFERENTIAL/PLATELET
BASOS ABS: 0 10*3/uL (ref 0.0–0.1)
BASOS PCT: 0 % (ref 0–1)
Eosinophils Absolute: 0.3 10*3/uL (ref 0.0–0.7)
Eosinophils Relative: 2 % (ref 0–5)
HEMATOCRIT: 30.2 % — AB (ref 39.0–52.0)
Hemoglobin: 9.9 g/dL — ABNORMAL LOW (ref 13.0–17.0)
Lymphocytes Relative: 8 % — ABNORMAL LOW (ref 12–46)
Lymphs Abs: 1.4 10*3/uL (ref 0.7–4.0)
MCH: 29.8 pg (ref 26.0–34.0)
MCHC: 32.8 g/dL (ref 30.0–36.0)
MCV: 91 fL (ref 78.0–100.0)
Monocytes Absolute: 1.9 10*3/uL — ABNORMAL HIGH (ref 0.1–1.0)
Monocytes Relative: 12 % (ref 3–12)
Neutro Abs: 12.5 10*3/uL — ABNORMAL HIGH (ref 1.7–7.7)
Neutrophils Relative %: 78 % — ABNORMAL HIGH (ref 43–77)
Platelets: 313 10*3/uL (ref 150–400)
RBC: 3.32 MIL/uL — ABNORMAL LOW (ref 4.22–5.81)
RDW: 12.5 % (ref 11.5–15.5)
WBC: 16 10*3/uL — ABNORMAL HIGH (ref 4.0–10.5)

## 2014-06-19 LAB — MRSA CULTURE

## 2014-06-19 SURGERY — DEBRIDEMENT, WOUND, WITH CLOSURE
Anesthesia: General | Site: Knee | Laterality: Left

## 2014-06-19 MED ORDER — OXYCODONE HCL 5 MG PO TABS: 5.0000 mg | ORAL_TABLET | Freq: Four times a day (QID) | ORAL | Status: DC | PRN

## 2014-06-19 MED ORDER — PROPOFOL 10 MG/ML IV BOLUS
INTRAVENOUS | Status: AC
  Filled 2014-06-19: qty 20

## 2014-06-19 MED ORDER — BISACODYL 10 MG RE SUPP: 10.0000 mg | Freq: Every day | RECTAL | Status: DC | PRN

## 2014-06-19 MED ORDER — FENTANYL CITRATE 0.05 MG/ML IJ SOLN
INTRAMUSCULAR | Status: AC
  Filled 2014-06-19: qty 2

## 2014-06-19 MED ORDER — FENTANYL CITRATE 0.05 MG/ML IJ SOLN
INTRAMUSCULAR | Status: DC | PRN
  Administered 2014-06-19 (×4): 50 ug via INTRAVENOUS

## 2014-06-19 MED ORDER — METHOCARBAMOL 1000 MG/10ML IJ SOLN
1000.0000 mg | Freq: Once | INTRAVENOUS | Status: AC
  Administered 2014-06-19: 1000 mg via INTRAVENOUS
  Filled 2014-06-19 (×2): qty 10

## 2014-06-19 MED ORDER — SODIUM CHLORIDE 0.9 % IJ SOLN
10.0000 mL | INTRAMUSCULAR | Status: DC | PRN
  Administered 2014-06-22 – 2014-06-23 (×2): 10 mL

## 2014-06-19 MED ORDER — MIDAZOLAM HCL 2 MG/2ML IJ SOLN
INTRAMUSCULAR | Status: AC
Start: 2014-06-19 — End: 2014-06-19
  Filled 2014-06-19: qty 2

## 2014-06-19 MED ORDER — LACTATED RINGERS IV SOLN: INTRAVENOUS | Status: DC

## 2014-06-19 MED ORDER — PROMETHAZINE HCL 25 MG/ML IJ SOLN: 6.2500 mg | INTRAMUSCULAR | Status: DC | PRN

## 2014-06-19 MED ORDER — HYDROMORPHONE HCL PF 1 MG/ML IJ SOLN: 1.0000 mg | INTRAMUSCULAR | Status: DC | PRN

## 2014-06-19 MED ORDER — LACTATED RINGERS IV SOLN
INTRAVENOUS | Status: DC
  Administered 2014-06-19: 1000 mL via INTRAVENOUS

## 2014-06-19 MED ORDER — HYDROMORPHONE HCL PF 1 MG/ML IJ SOLN
INTRAMUSCULAR | Status: AC
  Filled 2014-06-19: qty 1

## 2014-06-19 MED ORDER — ENOXAPARIN SODIUM 40 MG/0.4ML ~~LOC~~ SOLN: 40.0000 mg | SUBCUTANEOUS | Status: DC

## 2014-06-19 MED ORDER — PROPOFOL 10 MG/ML IV BOLUS
INTRAVENOUS | Status: DC | PRN
  Administered 2014-06-19: 150 mg via INTRAVENOUS
  Administered 2014-06-19: 50 mg via INTRAVENOUS

## 2014-06-19 MED ORDER — SODIUM CHLORIDE 0.9 % IV SOLN: INTRAVENOUS | Status: DC

## 2014-06-19 MED ORDER — POTASSIUM CHLORIDE CRYS ER 20 MEQ PO TBCR
40.0000 meq | EXTENDED_RELEASE_TABLET | Freq: Once | ORAL | Status: AC
  Administered 2014-06-19: 40 meq via ORAL
  Filled 2014-06-19: qty 2

## 2014-06-19 MED ORDER — LIDOCAINE HCL (CARDIAC) 20 MG/ML IV SOLN
INTRAVENOUS | Status: DC | PRN
  Administered 2014-06-19: 20 mg via INTRAVENOUS

## 2014-06-19 MED ORDER — HYDROMORPHONE HCL PF 1 MG/ML IJ SOLN
0.2500 mg | INTRAMUSCULAR | Status: DC | PRN
  Administered 2014-06-19 (×4): 0.5 mg via INTRAVENOUS

## 2014-06-19 MED ORDER — KETOROLAC TROMETHAMINE 30 MG/ML IJ SOLN
15.0000 mg | Freq: Once | INTRAMUSCULAR | Status: AC | PRN
  Administered 2014-06-19: 30 mg via INTRAVENOUS

## 2014-06-19 MED ORDER — LACTATED RINGERS IV SOLN
INTRAVENOUS | Status: DC | PRN
  Administered 2014-06-19: 15:00:00 via INTRAVENOUS

## 2014-06-19 MED ORDER — METHOCARBAMOL 1000 MG/10ML IJ SOLN
500.0000 mg | Freq: Once | INTRAVENOUS | Status: AC
  Administered 2014-06-19: 500 mg via INTRAVENOUS
  Filled 2014-06-19: qty 5

## 2014-06-19 MED ORDER — FERROUS SULFATE 325 (65 FE) MG PO TABS
325.0000 mg | ORAL_TABLET | Freq: Three times a day (TID) | ORAL | Status: DC
  Administered 2014-06-20 – 2014-06-22 (×9): 325 mg via ORAL
  Filled 2014-06-19 (×13): qty 1

## 2014-06-19 MED ORDER — KETOROLAC TROMETHAMINE 30 MG/ML IJ SOLN
INTRAMUSCULAR | Status: AC
  Filled 2014-06-19: qty 1

## 2014-06-19 MED ORDER — SODIUM CHLORIDE 0.9 % IV SOLN
510.0000 mg | Freq: Once | INTRAVENOUS | Status: AC
  Administered 2014-06-19: 510 mg via INTRAVENOUS
  Filled 2014-06-19: qty 17

## 2014-06-19 MED ORDER — ONDANSETRON HCL 4 MG/2ML IJ SOLN
INTRAMUSCULAR | Status: AC
  Filled 2014-06-19: qty 2

## 2014-06-19 MED ORDER — ENOXAPARIN SODIUM 40 MG/0.4ML ~~LOC~~ SOLN
40.0000 mg | SUBCUTANEOUS | Status: DC
  Administered 2014-06-20 – 2014-06-23 (×4): 40 mg via SUBCUTANEOUS
  Filled 2014-06-19 (×4): qty 0.4

## 2014-06-19 MED ORDER — ONDANSETRON HCL 4 MG/2ML IJ SOLN
INTRAMUSCULAR | Status: DC | PRN
  Administered 2014-06-19: 4 mg via INTRAVENOUS

## 2014-06-19 MED ORDER — DEXAMETHASONE SODIUM PHOSPHATE 10 MG/ML IJ SOLN
INTRAMUSCULAR | Status: DC | PRN
  Administered 2014-06-19: 10 mg via INTRAVENOUS

## 2014-06-19 MED ORDER — HYDROCODONE-ACETAMINOPHEN 10-325 MG PO TABS: 1.0000 | ORAL_TABLET | Freq: Four times a day (QID) | ORAL | Status: DC | PRN

## 2014-06-19 MED ORDER — FENTANYL CITRATE 0.05 MG/ML IJ SOLN
25.0000 ug | INTRAMUSCULAR | Status: DC | PRN
  Administered 2014-06-19 (×3): 50 ug via INTRAVENOUS

## 2014-06-19 MED ORDER — MIDAZOLAM HCL 5 MG/5ML IJ SOLN
INTRAMUSCULAR | Status: DC | PRN
  Administered 2014-06-19: 2 mg via INTRAVENOUS

## 2014-06-19 MED ORDER — METHOCARBAMOL 500 MG PO TABS: 500.0000 mg | ORAL_TABLET | Freq: Four times a day (QID) | ORAL | Status: DC | PRN

## 2014-06-19 MED ORDER — DEXAMETHASONE SODIUM PHOSPHATE 10 MG/ML IJ SOLN
INTRAMUSCULAR | Status: AC
  Filled 2014-06-19: qty 1

## 2014-06-19 SURGICAL SUPPLY — 51 items
ADH SKN CLS APL DERMABOND .7 (GAUZE/BANDAGES/DRESSINGS)
BAG SPEC THK2 15X12 ZIP CLS (MISCELLANEOUS) ×1
BAG ZIPLOCK 12X15 (MISCELLANEOUS) ×2 IMPLANT
BANDAGE ELASTIC 6 VELCRO ST LF (GAUZE/BANDAGES/DRESSINGS) ×1 IMPLANT
BANDAGE ESMARK 6X9 LF (GAUZE/BANDAGES/DRESSINGS) ×1 IMPLANT
BNDG CMPR 9X6 STRL LF SNTH (GAUZE/BANDAGES/DRESSINGS) ×1
BNDG ESMARK 6X9 LF (GAUZE/BANDAGES/DRESSINGS) ×2
CUFF TOURN SGL QUICK 34 (TOURNIQUET CUFF)
CUFF TRNQT CYL 34X4X40X1 (TOURNIQUET CUFF) ×1 IMPLANT
DERMABOND ADVANCED (GAUZE/BANDAGES/DRESSINGS)
DERMABOND ADVANCED .7 DNX12 (GAUZE/BANDAGES/DRESSINGS) ×1 IMPLANT
DRAPE EXTREMITY T 121X128X90 (DRAPE) ×2 IMPLANT
DRSG ADAPTIC 3X8 NADH LF (GAUZE/BANDAGES/DRESSINGS) ×1 IMPLANT
DRSG AQUACEL AG ADV 3.5X10 (GAUZE/BANDAGES/DRESSINGS) ×2 IMPLANT
DRSG PAD ABDOMINAL 8X10 ST (GAUZE/BANDAGES/DRESSINGS) ×1 IMPLANT
DRSG TEGADERM 4X4.75 (GAUZE/BANDAGES/DRESSINGS) ×1 IMPLANT
DRSG XEROFORM 1X8 (GAUZE/BANDAGES/DRESSINGS) ×1 IMPLANT
DURAPREP 26ML APPLICATOR (WOUND CARE) ×1 IMPLANT
ELECT REM PT RETURN 9FT ADLT (ELECTROSURGICAL) ×2
ELECTRODE REM PT RTRN 9FT ADLT (ELECTROSURGICAL) ×1 IMPLANT
EVACUATOR 1/8 PVC DRAIN (DRAIN) ×1 IMPLANT
GAUZE SPONGE 2X2 8PLY STRL LF (GAUZE/BANDAGES/DRESSINGS) ×1 IMPLANT
GAUZE SPONGE 4X4 12PLY STRL (GAUZE/BANDAGES/DRESSINGS) ×1 IMPLANT
GLOVE BIO SURGEON STRL SZ7.5 (GLOVE) ×2 IMPLANT
GLOVE BIOGEL PI IND STRL 8 (GLOVE) ×2 IMPLANT
GLOVE BIOGEL PI INDICATOR 8 (GLOVE) ×2
GLOVE ECLIPSE 8.0 STRL XLNG CF (GLOVE) IMPLANT
GOWN SPEC L3 XXLG W/TWL (GOWN DISPOSABLE) ×4 IMPLANT
GOWN STRL REUS W/TWL LRG LVL3 (GOWN DISPOSABLE) ×2 IMPLANT
HANDPIECE INTERPULSE COAX TIP (DISPOSABLE) ×2
IMMOBILIZER KNEE 22 UNIV (SOFTGOODS) ×1 IMPLANT
KIT BASIN OR (CUSTOM PROCEDURE TRAY) ×2 IMPLANT
MANIFOLD NEPTUNE II (INSTRUMENTS) ×2 IMPLANT
PACK TOTAL JOINT (CUSTOM PROCEDURE TRAY) ×2 IMPLANT
PADDING CAST COTTON 6X4 STRL (CAST SUPPLIES) ×1 IMPLANT
POSITIONER SURGICAL ARM (MISCELLANEOUS) ×2 IMPLANT
SET HNDPC FAN SPRY TIP SCT (DISPOSABLE) ×1 IMPLANT
SPONGE GAUZE 2X2 STER 10/PKG (GAUZE/BANDAGES/DRESSINGS)
STAPLER VISISTAT 35W (STAPLE) ×1 IMPLANT
SUT ETHILON 2 0 PSLX (SUTURE) ×1 IMPLANT
SUT MNCRL AB 4-0 PS2 18 (SUTURE) ×1 IMPLANT
SUT PDS AB 0 CT1 36 (SUTURE) ×2 IMPLANT
SUT PDS AB 1 CT1 27 (SUTURE) ×6 IMPLANT
SUT VIC AB 0 CT1 27 (SUTURE) ×2
SUT VIC AB 0 CT1 27XBRD ANTBC (SUTURE) IMPLANT
SUT VIC AB 1 CT1 36 (SUTURE) ×4 IMPLANT
SUT VIC AB 2-0 CT1 27 (SUTURE) ×8
SUT VIC AB 2-0 CT1 TAPERPNT 27 (SUTURE) ×3 IMPLANT
SWAB COLLECTION DEVICE MRSA (MISCELLANEOUS) ×2 IMPLANT
TOWEL OR 17X26 10 PK STRL BLUE (TOWEL DISPOSABLE) ×4 IMPLANT
TUBE ANAEROBIC SPECIMEN COL (MISCELLANEOUS) ×2 IMPLANT
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
## 2014-06-19 NOTE — Progress Notes (Signed)
Pt refused to have soap sud enema at this time. Will continue to monitor.

## 2014-06-19 NOTE — Discharge Instructions (Signed)
Orthopaedic Trauma Service Discharge Instructions   General Discharge Instructions  WEIGHT BEARING STATUS: Weightbear as tolerated Left leg with knee in full extension and knee immobilizer on   RANGE OF MOTION/ACTIVITY: No left knee Range of motion yet.  Activity as tolerated while maintaining range of motion restrictions and weightbearing restrictions   Wound Care: daily dressing changes starting on 06/22/2014. See instructions below  DVT/PE prophylaxis: Lovenox 40 mg sq injection daily x 3 weeks   Diet: as you were eating previously.  Can use over the counter stool softeners and bowel preparations, such as Miralax, to help with bowel movements.  Narcotics can be constipating.  Be sure to drink plenty of fluids  STOP SMOKING OR USING NICOTINE PRODUCTS!!!!  As discussed nicotine severely impairs your body's ability to heal surgical and traumatic wounds but also impairs bone healing.  Wounds and bone heal by forming microscopic blood vessels (angiogenesis) and nicotine is a vasoconstrictor (essentially, shrinks blood vessels).  Therefore, if vasoconstriction occurs to these microscopic blood vessels they essentially disappear and are unable to deliver necessary nutrients to the healing tissue.  This is one modifiable factor that you can do to dramatically increase your chances of healing your injury.    (This means no smoking, no nicotine gum, patches, etc)  DO NOT USE NONSTEROIDAL ANTI-INFLAMMATORY DRUGS (NSAID'S)  Using products such as Advil (ibuprofen), Aleve (naproxen), Motrin (ibuprofen) for additional pain control during fracture healing can delay and/or prevent the healing response.  If you would like to take over the counter (OTC) medication, Tylenol (acetaminophen) is ok.  However, some narcotic medications that are given for pain control contain acetaminophen as well. Therefore, you should not exceed more than 4000 mg of tylenol in a day if you do not have liver disease.  Also note that  there are may OTC medicines, such as cold medicines and allergy medicines that my contain tylenol as well.  If you have any questions about medications and/or interactions please ask your doctor/PA or your pharmacist.   PAIN MEDICATION USE AND EXPECTATIONS  You have likely been given narcotic medications to help control your pain.  After a traumatic event that results in an fracture (broken bone) with or without surgery, it is ok to use narcotic pain medications to help control one's pain.  We understand that everyone responds to pain differently and each individual patient will be evaluated on a regular basis for the continued need for narcotic medications. Ideally, narcotic medication use should last no more than 6-8 weeks (coinciding with fracture healing).   As a patient it is your responsibility as well to monitor narcotic medication use and report the amount and frequency you use these medications when you come to your office visit.   We would also advise that if you are using narcotic medications, you should take a dose prior to therapy to maximize you participation.  IF YOU ARE ON NARCOTIC MEDICATIONS IT IS NOT PERMISSIBLE TO OPERATE A MOTOR VEHICLE (MOTORCYCLE/CAR/TRUCK/MOPED) OR HEAVY MACHINERY DO NOT MIX NARCOTICS WITH OTHER CNS (CENTRAL NERVOUS SYSTEM) DEPRESSANTS SUCH AS ALCOHOL       ICE AND ELEVATE INJURED/OPERATIVE EXTREMITY  Using ice and elevating the injured extremity above your heart can help with swelling and pain control.  Icing in a pulsatile fashion, such as 20 minutes on and 20 minutes off, can be followed.    Do not place ice directly on skin. Make sure there is a barrier between to skin and the ice pack.  Using frozen items such as frozen peas works well as the conform nicely to the are that needs to be iced.  USE AN ACE WRAP OR TED HOSE FOR SWELLING CONTROL  In addition to icing and elevation, Ace wraps or TED hose are used to help limit and resolve swelling.  It is  recommended to use Ace wraps or TED hose until you are informed to stop.    When using Ace Wraps start the wrapping distally (farthest away from the body) and wrap proximally (closer to the body)   Example: If you had surgery on your leg or thing and you do not have a splint on, start the ace wrap at the toes and work your way up to the thigh        If you had surgery on your upper extremity and do not have a splint on, start the ace wrap at your fingers and work your way up to the upper arm  IIF YOU ARE IN A CAM BOOT (BLACK BOOT)  You may remove boot periodically. Perform daily dressing changes as noted below.  Wash the liner of the boot regularly and wear a sock when wearing the boot. It is recommended that you sleep in the boot until told otherwise  CALL THE OFFICE WITH ANY QUESTIONS OR CONCERTS: [phone]    Discharge Wound Care Instructions  Do NOT apply any ointments, solutions or lotions to pin sites or surgical wounds.  These prevent needed drainage and even though solutions like hydrogen peroxide kill bacteria, they also damage cells lining the pin sites that help fight infection.  Applying lotions or ointments can keep the wounds moist and can cause them to breakdown and open up as well. This can increase the risk for infection. When in doubt call the office.  Surgical incisions should be dressed daily.  If any drainage is noted, use one layer of adaptic, then gauze, Kerlix, and an ace wrap.  Once the incision is completely dry and without drainage, it may be left open to air out.  Showering may begin 36-48 hours later.  Cleaning gently with soap and water.  Traumatic wounds should be dressed daily as well.    One layer of adaptic, gauze, Kerlix, then ace wrap.  The adaptic can be discontinued once the draining has ceased    If you have a wet to dry dressing: wet the gauze with saline the squeeze as much saline out so the gauze is moist (not soaking wet), place moistened gauze  over wound, then place a dry gauze over the moist one, followed by Kerlix wrap, then ace wrap.  Specific Orthopaedic Injury Instructions  ORIF L patella and extensor mechanism             WBAT with L knee in full extension and knee immobilizer on             Ice and elevate             PT               Ordered hinged brace- this will not be put on the pt until his office follow up next week, bring to office follow up              Will begin gentle ROM next week  Progression for range of motion as follows          Drop arm hinge knee brace L leg.  Leave locked in full extension for  weightbearing.  Plan following protocol for knee ROM pod 1-7 no motion except maintaining full extension, day 7-14 0-30 degrees, day 14-21 0-60 degrees and days 21-30 0-90 degrees. These are not concrete and will adjust as needed. Will show you how brace works at follow up appointment

## 2014-06-19 NOTE — Progress Notes (Signed)
[facility] PROGRESS NOTE  Abdoul Encinas ZOX:[id number] DOB: Feb 28, 1951 DOA: 06/16/2014 PCP: No PCP Per Patient  Assessment/Plan:  #1 septic left knee joint Patient with recent ORIF of the left patellar 06/12/2014 presented with left knee pain, swelling, erythema, tenderness to palpation. Patient noted to be febrile with temp as high as 102.7 on the day of admission. Patient is status post I and D with placement of wound VAC on 06/17/2014. Patient to return to the operating room today for repeat irrigation/debridement of the left knee and hopefully closure of his wound. Preliminary wound cultures growing gram-positive cocci in clusters. Continue empiric IV vancomycin. IV Zosyn has been discontinued. ID patient likely will need at least 4 weeks of IV antibiotic therapy. ID following and appreciate input and recommendations. Orthopedics followed and I appreciate input and recommendations.  #2 sepsis Secondary to problem #1. Patient noted to have a Tmax of 100.3 last night with heart rate as high as 107. CBC with a white count of 24.2 on admission and now 16.0. Patient status post ORIF for left patellar fracture on 06/12/2014 now presenting with left knee swelling, erythema, warmth, tenderness to palpation with noted purulent drainage. Blood cultures are pending. Urinalysis was negative. Patient has been assessed by orthopedics and patient s/p I and D with placement of wound vac 06/17/14. Wound cultures pending with preliminary cultures growing gram-positive cocci in clusters.  Continue empiric IV vancomycin IV. IV Zosyn was discontinued yesterday. ID following and appreciate input and recommendations. Per ID patient will likely need 4-6 weeks of IV antibiotics. PICC line has been placed.  #3 fever/leukocytosis Likely secondary to problem #1 and 2. Blood cultures are pending. Urinalysis was negative. Preliminary wound cultures with gram-positive cocci in clusters.  Patient is status post I and a D with  placement of wound VAC. Continue empiric IV vancomycin. IV Zosyn was discontinued yesterday per infectious diseases. ID following and appreciate input and recommendations.   #4 acute blood loss anemia/postoperative acute blood loss anemia/severe iron deficiency anemia Anemia panel consistent with severe iron deficiency anemia. Patient was given a dose of IV iron yesterday. Follow H&H. Patient is a 63 and due to history of severe iron deficiency anemia may benefit from a screening colonoscopy as outpatient. Will likely need to be placed on oral iron on discharge. Transfusion threshold hemoglobin less than 7.  #5 hyponatremia Likely secondary to hypovolemic hyponatremia. Improved with hydration. Follow.  #6 uncontrolled pain Pain management.  #7 elevated blood pressure Likely secondary to pain. Patient not on antihypertensives at home. Will monitor for now. Pain management.  #8 prophylaxis Lovenox for DVT prophylaxis.  Code Status: Full Family Communication: Updated patient and wife at bedside. Disposition Plan: probably SNF versus home when medically stable   Consultants:  Orthopedics: Montez Morita, PA 06/16/2014  Procedures:  Chest x-ray 06/16/2014  X-ray of the left knee 06/17/2014  I and D of left knee wound/Arthrotomy and lavage of left knee joint/aappreciation of small wound VAC 06/17/2014 Dr. Carola Frost  PICC line 06/19/2014 Antibiotics:  IV vancomycin 06/17/2014  IV Zosyn  06/17/2014>>>>> 06/18/2014  HPI/Subjective: Patient states left lower extremity pain improving. Patient more alert and following commands. Patient denies any shortness of breath. No chest pain. Patient still with no bowel movement.  Objective: Filed Vitals:   06/19/14 0525  BP: 109/62  Pulse: 94  Temp: 99.5 F (37.5 C)  Resp: 18    Intake/Output Summary (Last 24 hours) at 06/19/14 1005 Last data filed at 06/19/14 8119  Gross per 24  hour  Intake   3020 ml  Output   2005 ml  Net   1015 ml    Filed Weights   06/16/14 1100  Weight: 102.059 kg (225 lb)    Exam:   General:  NAD  Cardiovascular:  RRR.  Respiratory: Clear to auscultation bilaterally  Abdomen: Soft, nontender, nondistended, positive bowel sounds.  Musculoskeletal: No clubbing cyanosis. Left knee with bandage and wound vac.   Data Reviewed: Basic Metabolic Panel:  Recent Labs Lab 06/17/14 0425 06/18/14 0418 06/19/14 0433  NA 129* 132* 134*  K 4.1 4.0 3.6*  CL 92* 96 96  CO2 GLUCOSE 109* 112* 109*  BUN CREATININE 0.89 0.96 0.85  CALCIUM 8.6 8.1* 8.1*  MG  --  1.9  --    Liver Function Tests:  Recent Labs Lab 06/17/14 0425  AST 13  ALT 10  ALKPHOS 74  BILITOT 1.0  PROT 7.3  ALBUMIN 2.7*   No results found for this basename: LIPASE, AMYLASE,  in the last 168 hours No results found for this basename: AMMONIA,  in the last 168 hours CBC:  Recent Labs Lab 06/17/14 0425 06/18/14 0418 06/19/14 0433  WBC 24.2* 18.8* 16.0*  NEUTROABS  --  16.1* 12.5*  HGB 12.4* 10.0* 9.9*  HCT 37.5* 29.6* 30.2*  MCV 90.8 89.7 91.0  PLT 276 271 313   Cardiac Enzymes: No results found for this basename: CKTOTAL, CKMB, CKMBINDEX, TROPONINI,  in the last 168 hours BNP (last 3 results) No results found for this basename: PROBNP,  in the last 8760 hours CBG: No results found for this basename: GLUCAP,  in the last 168 hours  Recent Results (from the past 240 hour(s))  CULTURE, BLOOD (ROUTINE X 2)     Status: None   Collection Time    06/16/14 12:50 PM      Result Value Ref Range Status   Specimen Description BLOOD LEFT WRIST   Final   Special Requests BOTTLES DRAWN AEROBIC ONLY 5CC   Final   Culture  Setup Time     Final   Value: 06/16/2014 17:08     Performed at [facility]   Culture     Final   Value:        BLOOD CULTURE RECEIVED NO GROWTH TO DATE CULTURE WILL BE HELD FOR 5 DAYS BEFORE ISSUING A FINAL NEGATIVE REPORT     Performed at [facility]    Report Status PENDING   Incomplete  CULTURE, BLOOD (ROUTINE X 2)     Status: None   Collection Time    06/16/14 12:50 PM      Result Value Ref Range Status   Specimen Description BLOOD RIGHT ARM   Final   Special Requests BOTTLES DRAWN AEROBIC AND ANAEROBIC 6CC   Final   Culture  Setup Time     Final   Value: 06/16/2014 17:07     Performed at [facility]   Culture     Final   Value:        BLOOD CULTURE RECEIVED NO GROWTH TO DATE CULTURE WILL BE HELD FOR 5 DAYS BEFORE ISSUING A FINAL NEGATIVE REPORT     Performed at [facility]   Report Status PENDING   Incomplete  URINE CULTURE     Status: None   Collection Time    06/16/14  9:44 PM      Result Value Ref Range Status  Specimen Description URINE, CLEAN CATCH   Final   Special Requests NONE   Final   Culture  Setup Time     Final   Value: 06/17/2014 04:49     Performed at [facility]   Colony Count     Final   Value: NO GROWTH     Performed at [facility]   Culture     Final   Value: NO GROWTH     Performed at [facility]   Report Status 06/18/2014 FINAL   Final  SURGICAL PCR SCREEN     Status: Abnormal   Collection Time    06/17/14  7:50 AM      Result Value Ref Range Status   MRSA, PCR INVALID RESULTS, SPECIMEN SENT FOR CULTURE (*) NEGATIVE Final   Comment: RESULT CALLED TO, READ BACK BY AND VERIFIED WITH:     FARGO,A @ 1048 ON [id number] BY POTEAT,S   Staphylococcus aureus INVALID RESULTS, SPECIMEN SENT FOR CULTURE (*) NEGATIVE Final   Comment: RESULT CALLED TO, READ BACK BY AND VERIFIED WITH:     FARGO,A @ 1048 ON [id number] BY POTEAT,S                The Xpert SA Assay (FDA     approved for NASAL specimens     in patients over 47 years of age),     is one component of     a comprehensive surveillance     program.  Test performance has     been validated by [employer] for patients greater     than or equal to 68 year old.     It is not intended     to diagnose infection  nor to     guide or monitor treatment.  MRSA CULTURE     Status: None   Collection Time    06/17/14  7:50 AM      Result Value Ref Range Status   Specimen Description NOSE   Final   Special Requests NONE   Final   Culture     Final   Value: Culture reincubated for better growth     Performed at [facility]   Report Status PENDING   Incomplete  ANAEROBIC CULTURE     Status: None   Collection Time    06/17/14 10:47 AM      Result Value Ref Range Status   Specimen Description FLUID LEFT KNEE   Final   Special Requests NONE   Final   Gram Stain     Final   Value: ABUNDANT WBC PRESENT, PREDOMINANTLY PMN     NO SQUAMOUS EPITHELIAL CELLS SEEN     FEW GRAM POSITIVE COCCI     IN CLUSTERS Performed by [facility]     Performed at [facility]   Culture     Final   Value: NO ANAEROBES ISOLATED; CULTURE IN PROGRESS FOR 5 DAYS     Note: Gram Stain Report Called to,Read Back By and Verified With: DR HANDY AT 1135 ON 09.09.15 BY SHUEA     Performed at [facility]   Report Status PENDING   Incomplete  BODY FLUID CULTURE     Status: None   Collection Time    06/17/14 10:47 AM      Result Value Ref Range Status   Specimen Description FLUID LEFT KNEE   Final   Special Requests NONE  Final   Gram Stain     Final   Value: ABUNDANT WBC PRESENT, PREDOMINANTLY PMN     FEW GRAM POSITIVE COCCI     IN CLUSTERS Performed by [facility]     Performed at [facility]   Culture     Final   Value: MODERATE STAPHYLOCOCCUS AUREUS     Note: RIFAMPIN AND GENTAMICIN SHOULD NOT BE USED AS SINGLE DRUGS FOR TREATMENT OF STAPH INFECTIONS.     Note: Gram Stain Report Called to,Read Back By and Verified With: DR HANDY AT 1135 ON Jul 14, 2014 BY SHUEA     Performed at [facility]   Report Status PENDING   Incomplete  GRAM STAIN     Status: None   Collection Time    2014/07/14 10:47 AM      Result Value Ref Range Status   Specimen Description FLUID LEFT  KNEE   Final   Special Requests NONE   Final   Gram Stain     Final   Value: ABUNDANT WBC PRESENT, PREDOMINANTLY PMN     FEW GRAM POSITIVE COCCI IN CLUSTERS     Gram Stain Report Called to,Read Back By and Verified With: DR. HANDY AT 1135 ON 07/14/2014 BY SHUEA   Report Status 07-14-2014 FINAL   Final     Studies: Dg Knee 1-2 Views Left  07/14/2014   CLINICAL DATA:  63 year old male with left knee infection. Recent surgery. Initial encounter.  EXAM: LEFT KNEE - 1-2 VIEW  COMPARISON:  06/12/2014.  FINDINGS: Distal third patella fracture with interval increased distraction of fragments on the 2 provided intraoperative fluoroscopic images. No hardware identified.  IMPRESSION: Distal patella fracture with increased distraction of fragments since 06/12/2014.   Electronically Signed   By: Augusto Gamble M.D.   On: 07-14-2014 11:43   Dg Abd 1 View  06/18/2014   CLINICAL DATA:  Constipation and abdominal distention  EXAM: ABDOMEN - 1 VIEW  COMPARISON:  None.  FINDINGS: There is diffuse stool throughout colon. Bowel gas pattern is unremarkable. No obstruction or free air is seen on this supine examination. There are no abnormal calcifications.  IMPRESSION: Diffuse stool throughout colon.  Bowel gas pattern unremarkable.   Electronically Signed   By: Bretta Bang M.D.   On: 06/18/2014 16:53   Dg Knee Left Port  07/14/2014   CLINICAL DATA:  Status post patellar fracture fixation  EXAM: PORTABLE LEFT KNEE - 1-2 VIEW  COMPARISON:  06/12/2014  FINDINGS: The patellar fracture is again identified. The inferior fracture fragment is slightly displaced with respect to the main body of the patella. Multiple round radiopaque densities are identified consistent with antibiotic beads. A surgical drain is noted in place. No other focal abnormality is seen.   Electronically Signed   By: Alcide Clever M.D.   On: Jul 14, 2014 13:48   Dg C-arm 1-60 Min-no Report  July 14, 2014   CLINICAL DATA: left knee   C-ARM 1-60 MINUTES   Fluoroscopy was utilized by the requesting physician.  No radiographic  interpretation.     Scheduled Meds: . enoxaparin (LOVENOX) injection  40 mg Subcutaneous Q24H  . methocarbamol  1,000 mg Oral QID  . polyethylene glycol  17 g Oral BID  . senna-docusate  2 tablet Oral QHS  . sorbitol  30 mL Oral Once  . vancomycin  1,000 mg Intravenous Q12H   Continuous Infusions: . sodium chloride 0.9 % 1,000 mL infusion 100 mL/hr at 06/18/14 2358  Principal Problem:   Septic joint of left knee joint Active Problems:   Patella fracture   Sepsis   Hyponatremia   Uncontrolled pain   Fever, unspecified   Postoperative anemia due to acute blood loss   Iron deficiency anemia    Time spent: 35 mins    [facility] MD [facility] Pager [phone]. If 7PM-7AM, please contact night-coverage at [email or web address], password [facility] 06/19/2014, 10:05 AM  LOS: 3 days

## 2014-06-19 NOTE — Brief Op Note (Signed)
06/16/2014 - 06/19/2014  4:08 PM  PATIENT:  Brian Atkinson  63 y.o. male  PRE-OPERATIVE DIAGNOSIS:  infected left knee  POST-OPERATIVE DIAGNOSIS:  infected left knee  PROCEDURE:  Procedure(s): DEBRIDEMENT AND CLOSURE LEFT KNEE (Left)  SURGEON:  Surgeon(s) and Role:    * Kathryne Hitch, MD - Primary  PHYSICIAN ASSISTANT: Rexene Edison, PA-C  ANESTHESIA:   general  EBL:  Total I/O In: 0  Out: 315 [Urine:300; Drains:15]  BLOOD ADMINISTERED:none  DRAINS: none   LOCAL MEDICATIONS USED:  NONE  COUNTS:  YES  TOURNIQUET:  * No tourniquets in log *  DICTATION: .Other Dictation: Dictation Number [id number]  PLAN OF CARE: Admit to inpatient   PATIENT DISPOSITION:  PACU - hemodynamically stable.   Delay start of Pharmacological VTE agent (>24hrs) due to surgical blood loss or risk of bleeding: no

## 2014-06-19 NOTE — Progress Notes (Signed)
[facility] Progress Note  Subjective  Doing ok this am Ready for OR PICC to be placed this am Evaluated by ID, zosyn stopped and pt continued on Vanc   Had to change gown a couple times last night due to sweating   Voiding w/o difficulty   Pt given IV iron due to undetectable iron levels   Objective   BP 109/62  Pulse 94  Temp(Src) 99.5 F (37.5 C) (Oral)  Resp 18  Ht  (1.803 m)  Wt 102.059 kg (225 lb)  BMI 31.39 kg/m2  SpO2 97%  Tmax: 1343  yesterday   Intake/Output     09/10 0701 - 09/11 0700 09/11 0701 - 09/12 0700   P.O. 860    I.V. (mL/kg) 2200 (21.6)    IV Piggyback 200    Total Intake(mL/kg) 3260 (31.9)    Urine (mL/kg/hr) 2400 (1)    Drains 195 (0.1)    Blood     Total Output 2595     Net +665            Labs Results for Brian Atkinson, Brian Atkinson (MRN [id number]) as of 06/19/2014 08:34  Ref. Range 06/19/2014 04:33  Sodium Latest Range: 137-147 mEq/L 134 (L)  Potassium Latest Range: 3.7-5.3 mEq/L 3.6 (L)  Chloride Latest Range: 96-112 mEq/L 96  CO2 Latest Range: 19-32 mEq/L 26  BUN Latest Range: 6-23 mg/dL 9  Creatinine Latest Range: 0.50-1.35 mg/dL 4.09  Calcium Latest Range: 8.4-10.5 mg/dL 8.1 (L)  GFR calc non Af Amer Latest Range: >90 mL/min >90  GFR calc Af Amer Latest Range: >90 mL/min >90  Glucose Latest Range: 70-99 mg/dL 811 (H)  Anion gap Latest Range: 5-15  12  WBC Latest Range: 4.0-10.5 K/uL 16.0 (H)  RBC Latest Range: 4.22-5.81 MIL/uL 3.32 (L)  Hemoglobin Latest Range: 13.0-17.0 g/dL 9.9 (L)  HCT Latest Range: 39.0-52.0 % 30.2 (L)  MCV Latest Range: 78.0-100.0 fL 91.0  MCH Latest Range: 26.0-34.0 pg 29.8  MCHC Latest Range: 30.0-36.0 g/dL 91.4  RDW Latest Range: 11.5-15.5 % 12.5  Platelets Latest Range: 150-400 K/uL 313  Neutrophils Relative % Latest Range: 43-77 % 78 (H)  Lymphocytes Relative Latest Range: 12-46 % 8 (L)  Monocytes Relative Latest Range: 3-12 % 12  Eosinophils Relative Latest Range: 0-5 % 2   Basophils Relative Latest Range: 0-1 % 0  NEUT# Latest Range: 1.7-7.7 K/uL 12.5 (H)  Lymphocytes Absolute Latest Range: 0.7-4.0 K/uL 1.4  Monocytes Absolute Latest Range: 0.1-1.0 K/uL 1.9 (H)  Eosinophils Absolute Latest Range: 0.0-0.7 K/uL 0.3  Basophils Absolute Latest Range: 0.0-0.1 K/uL 0.0   Results for Brian Atkinson, Brian Atkinson (MRN [id number]) as of 06/19/2014 08:34  Ref. Range 06/18/2014 08:45  Iron Latest Range: 42-135 ug/dL <08 (L)  UIBC Latest Range: 125-400 ug/dL 657 (L)  TIBC Latest Range: 215-435 ug/dL Not calculated due to Iron <10.  Saturation Ratios Latest Range: 20-55 % Not calculated due to Iron <10.  Ferritin Latest Range: 22-322 ng/mL 427 (H)  Folate No range found 6.6  Vitamin B-12 Latest Range: 211-911 pg/mL 310     Exam  Gen: awake and alert, NAD Lungs: clear anterior fields Cardiac: Regular, s1 and s2 Abd: mildly distended, +BS, NT Ext:         Left lower Extremity               Dressing c/d/i             VAC functioning, sanguinous fluid  Hemovac functioning with sanguinous fluid as well             Distal motor and sensory functions intact             Ext warm             + DP pulse    Assessment and Plan   POD/HD#: 2   63 y/o male s/p I&D L knee for septic joint following open repair of distal pole of patella  1. Septic L knee s/p I&D             Vanc              appreciate ID consult              OR this afternoon for repeat I&D and possible closure  PICC to be placed this am               2. ORIF L patella and extensor mechanism             WBAT with L knee in full extension and knee immobilizer on             Ice and elevate             PT              Ordered hinged brace- this will not be put on the pt until his office follow up next week   Will begin gentle ROM next week                           No hardware is in the knee but there is braided suture  3. Fever/elevated WBC count             Continue to monitor              WBC count continues to trend down  4. Hyponatremia             Improved    5. Pain control             Continue with current meds  6. ID           Vanc x 4-6 weeks   Final regimen to be tailored to finalized cultures   7. DVT/PE prophylaxis             Lovenox   Dc home on lovenox x 21 days- see if we can get medication assistance    If unable to get assistance with lovenox can do ASA 325 mg po BID and will likely continue this for 3-4 weeks   8. FEN             Constipation                         mag citrate prn                          miralax    Advance bowel regimen   9. ABL anemia/iron deficiency anemia/? Anemia of chronic disease  CT from [facility] from day of injury demonstrated a lesion to the liver, read as a liver cyst.  ? If this is related to profound Iron deficiency   Pt given IV iron yesterday   10. dispo  OR today  Probable dc tomorrow   Follow up with ortho on 06/24/2014    [name], PA-C [facility] [phone] (P) 06/19/2014 8:31 AM  **Disclaimer: This note may have been dictated with voice recognition software. Similar sounding words can inadvertently be transcribed and this note may contain transcription errors which may not have been corrected upon publication of note.**

## 2014-06-19 NOTE — Progress Notes (Signed)
Utilization review completed.  Referral to clinical social worker for SNF placement at discharge.

## 2014-06-19 NOTE — Progress Notes (Signed)
MEDICATION RELATED CONSULT NOTE   Pharmacy Consult for IV Iron Indication: Symptomatic anemia in setting of iron deficiency   No Known Allergies  Patient Measurements: Height:  (180.3 cm) Weight: 225 lb (102.059 kg) IBW/kg (Calculated) : 75.3  Vital Signs: Temp: 99.5 F (37.5 C) (09/11 0525) Temp src: Oral (09/11 0525) BP: 109/62 mmHg (09/11 0525) Pulse Rate: 94 (09/11 0525) Intake/Output from previous day: 09/10 0701 - 09/11 0700 In: 3260 [P.O.:860; I.V.:2200; IV Piggyback:200] Out: 2595 [Urine:2400; Drains:195] Intake/Output from this shift: Total I/O In: 0  Out: 10 [Drains:10]  Labs:  Recent Labs  06/17/14 0425 06/18/14 0418 06/19/14 0433  WBC 24.2* 18.8* 16.0*  HGB 12.4* 10.0* 9.9*  HCT 37.5* 29.6* 30.2*  PLT 276 271 313  CREATININE 0.89 0.96 0.85  MG  --  1.9  --   ALBUMIN 2.7*  --   --   PROT 7.3  --   --   AST 13  --   --   ALT 10  --   --   ALKPHOS 74  --   --   BILITOT 1.0  --   --    Estimated Creatinine Clearance: 109.6 ml/min (by C-G formula based on Cr of 0.85).  06/18/14:  Iron < 10  Ferritin 427 UIBC 119 Folate 6.6 Vit B12 310    Microbiology: Recent Results (from the past 720 hour(s))  CULTURE, BLOOD (ROUTINE X 2)     Status: None   Collection Time    06/16/14 12:50 PM      Result Value Ref Range Status   Specimen Description BLOOD LEFT WRIST   Final   Special Requests BOTTLES DRAWN AEROBIC ONLY 5CC   Final   Culture  Setup Time     Final   Value: 06/16/2014 17:08     Performed at [facility]   Culture     Final   Value:        BLOOD CULTURE RECEIVED NO GROWTH TO DATE CULTURE WILL BE HELD FOR 5 DAYS BEFORE ISSUING A FINAL NEGATIVE REPORT     Performed at [facility]   Report Status PENDING   Incomplete  CULTURE, BLOOD (ROUTINE X 2)     Status: None   Collection Time    06/16/14 12:50 PM      Result Value Ref Range Status   Specimen Description BLOOD RIGHT ARM   Final   Special Requests BOTTLES DRAWN  AEROBIC AND ANAEROBIC 6CC   Final   Culture  Setup Time     Final   Value: 06/16/2014 17:07     Performed at [facility]   Culture     Final   Value:        BLOOD CULTURE RECEIVED NO GROWTH TO DATE CULTURE WILL BE HELD FOR 5 DAYS BEFORE ISSUING A FINAL NEGATIVE REPORT     Performed at [facility]   Report Status PENDING   Incomplete  URINE CULTURE     Status: None   Collection Time    06/16/14  9:44 PM      Result Value Ref Range Status   Specimen Description URINE, CLEAN CATCH   Final   Special Requests NONE   Final   Culture  Setup Time     Final   Value: 06/17/2014 04:49     Performed at [facility]     Final   Value: NO GROWTH  Performed at [facility]     Final   Value: NO GROWTH     Performed at [facility]   Report Status 06/18/2014 FINAL   Final  SURGICAL PCR SCREEN     Status: Abnormal   Collection Time    06/17/14  7:50 AM      Result Value Ref Range Status   MRSA, PCR INVALID RESULTS, SPECIMEN SENT FOR CULTURE (*) NEGATIVE Final   Comment: RESULT CALLED TO, READ BACK BY AND VERIFIED WITH:     FARGO,A @ 1048 ON [id number] BY POTEAT,S   Staphylococcus aureus INVALID RESULTS, SPECIMEN SENT FOR CULTURE (*) NEGATIVE Final   Comment: RESULT CALLED TO, READ BACK BY AND VERIFIED WITH:     FARGO,A @ 1048 ON [id number] BY POTEAT,S                The Xpert SA Assay (FDA     approved for NASAL specimens     in patients over 8 years of age),     is one component of     a comprehensive surveillance     program.  Test performance has     been validated by [employer] for patients greater     than or equal to 55 year old.     It is not intended     to diagnose infection nor to     guide or monitor treatment.  MRSA CULTURE     Status: None   Collection Time    06/17/14  7:50 AM      Result Value Ref Range Status   Specimen Description NOSE   Final   Special Requests NONE   Final   Culture     Final    Value: Culture reincubated for better growth     Performed at [facility]   Report Status PENDING   Incomplete  ANAEROBIC CULTURE     Status: None   Collection Time    06/17/14 10:47 AM      Result Value Ref Range Status   Specimen Description FLUID LEFT KNEE   Final   Special Requests NONE   Final   Gram Stain     Final   Value: ABUNDANT WBC PRESENT, PREDOMINANTLY PMN     NO SQUAMOUS EPITHELIAL CELLS SEEN     FEW GRAM POSITIVE COCCI     IN CLUSTERS Performed by [facility]     Performed at [facility]   Culture     Final   Value: NO ANAEROBES ISOLATED; CULTURE IN PROGRESS FOR 5 DAYS     Note: Gram Stain Report Called to,Read Back By and Verified With: DR HANDY AT 1135 ON 09.09.15 BY SHUEA     Performed at [facility]   Report Status PENDING   Incomplete  BODY FLUID CULTURE     Status: None   Collection Time    06/17/14 10:47 AM      Result Value Ref Range Status   Specimen Description FLUID LEFT KNEE   Final   Special Requests NONE   Final   Gram Stain     Final   Value: ABUNDANT WBC PRESENT, PREDOMINANTLY PMN     FEW GRAM POSITIVE COCCI     IN CLUSTERS Performed by [facility]     Performed at [facility]   Culture     Final   Value: MODERATE  STAPHYLOCOCCUS AUREUS     Note: RIFAMPIN AND GENTAMICIN SHOULD NOT BE USED AS SINGLE DRUGS FOR TREATMENT OF STAPH INFECTIONS.     Note: Gram Stain Report Called to,Read Back By and Verified With: DR HANDY AT 1135 ON 09.09.15 BY SHUEA     Performed at [facility]   Report Status PENDING   Incomplete  GRAM STAIN     Status: None   Collection Time    06/17/14 10:47 AM      Result Value Ref Range Status   Specimen Description FLUID LEFT KNEE   Final   Special Requests NONE   Final   Gram Stain     Final   Value: ABUNDANT WBC PRESENT, PREDOMINANTLY PMN     FEW GRAM POSITIVE COCCI IN CLUSTERS     Gram Stain Report Called to,Read Back By and Verified With: DR. HANDY AT 1135  ON 09.09.15 BY SHUEA   Report Status 06/17/2014 FINAL   Final    Medical History: Past Medical History  Diagnosis Date  . Medical history non-contributory     Assessment: 63 y/o M admitted with left knee infection/ possible septic arthritis s/p recent ORIF of left patellar fracture 9/4. Pt now s/p irrigation/debridment of left knee. Pt was found to have acute blood loss anemia,  iron deficiency (serum iron undetectable). Orders received for IV iron with pharmacy dosing assistance.   Calculate Iron deficiency is  iron for goal Hgb = 12g/dl  Ferric gluconate  IVPB given x 1 9/10 CBC: Hgb = 9.9 (stable)  Goal of Therapy:  Begin treatment of iron deficiency  Plan:   Give feraheme  IVPB x 1 dose to replete iron  Suggest start PO iron supplementation - d/w MD, will start in am  Will follow at distance to ensure tolerated IV iron  Brian Atkinson, PharmD, BCPS.   Pager: [phone] 06/19/2014 11:37 AM

## 2014-06-19 NOTE — Progress Notes (Signed)
Peripherally Inserted Central Catheter/Midline Placement  The IV Nurse has discussed with the patient and/or persons authorized to consent for the patient, the purpose of this procedure and the potential benefits and risks involved with this procedure.  The benefits include less needle sticks, lab draws from the catheter and patient may be discharged home with the catheter.  Risks include, but not limited to, infection, bleeding, blood clot (thrombus formation), and puncture of an artery; nerve damage and irregular heat beat.  Alternatives to this procedure were also discussed.  PICC/Midline Placement Documentation        Timmothy Sours 06/19/2014, 8:58 AM

## 2014-06-19 NOTE — Progress Notes (Signed)
Patient ID: Brian Atkinson, male   DOB: [birth year], 63 y.o.   MRN: [id number] Mr. Batie understands fully that we will be proceeding to the OR today for a repeat irrigation and debridement of his left infected knee.

## 2014-06-19 NOTE — Transfer of Care (Signed)
Immediate Anesthesia Transfer of Care Note  Patient: Brian Atkinson  Procedure(s) Performed: Procedure(s) (LRB): DEBRIDEMENT AND CLOSURE LEFT KNEE (Left)  Patient Location: PACU  Anesthesia Type: General  Level of Consciousness: sedated, patient cooperative and responds to stimulation  Airway & Oxygen Therapy: Patient Spontanous Breathing and Patient connected to face mask oxgen  Post-op Assessment: Report given to PACU RN and Post -op Vital signs reviewed and stable  Post vital signs: Reviewed and stable  Complications: No apparent anesthesia complications

## 2014-06-19 NOTE — Progress Notes (Signed)
Patient ID: Brian Atkinson  DOB: 1951/04/16, 63 y.o.   MRN: [id number]         [facility]    Date of Admission:  06/16/2014           Day 3 vancomycin Principal Problem:   Septic joint of left knee joint Active Problems:   Patella fracture   Sepsis   Hyponatremia   Uncontrolled pain   Fever, unspecified   Postoperative anemia due to acute blood loss   Iron deficiency anemia   . enoxaparin (LOVENOX) injection  40 mg Subcutaneous Q24H  . methocarbamol  1,000 mg Oral QID  . polyethylene glycol  17 g Oral BID  . senna-docusate  2 tablet Oral QHS  . sorbitol  30 mL Oral Once  . vancomycin  1,000 mg Intravenous Q12H    Subjective: He is feeling better with less pain.  Objective: Temp:  [99.5 F (37.5 C)-100.5 F (38.1 C)] 99.5 F (37.5 C) (09/11 0525) Pulse Rate:  [94-107] 94 (09/11 0525) Resp:  [18-19] 18 (09/11 0525) BP: (107-156)/(60-73) 109/62 mmHg (09/11 0525) SpO2:  [96 %-99 %] 97 % (09/11 0525)  General: He is more alert and comfortable Left leg: VAC dressing remains in  Lab Results Lab Results  Component Value Date   WBC 16.0* 06/19/2014   HGB 9.9* 06/19/2014   HCT 30.2* 06/19/2014   MCV 91.0 06/19/2014   PLT 313 06/19/2014    Lab Results  Component Value Date   CREATININE 0.85 06/19/2014   BUN 9 06/19/2014   NA 134* 06/19/2014   K 3.6* 06/19/2014   CL 96 06/19/2014   CO2 26 06/19/2014    Lab Results  Component Value Date   ALT 10 06/17/2014   AST 13 06/17/2014   ALKPHOS 74 06/17/2014   BILITOT 1.0 06/17/2014      Microbiology: Recent Results (from the past 240 hour(s))  CULTURE, BLOOD (ROUTINE X 2)     Status: None   Collection Time    06/16/14 12:50 PM      Result Value Ref Range Status   Specimen Description BLOOD LEFT WRIST   Final   Special Requests BOTTLES DRAWN AEROBIC ONLY 5CC   Final   Culture  Setup Time     Final   Value: 06/16/2014 17:08     Performed at [facility]   Culture     Final   Value:         BLOOD CULTURE RECEIVED NO GROWTH TO DATE CULTURE WILL BE HELD FOR 5 DAYS BEFORE ISSUING A FINAL NEGATIVE REPORT     Performed at [facility]   Report Status PENDING   Incomplete  CULTURE, BLOOD (ROUTINE X 2)     Status: None   Collection Time    06/16/14 12:50 PM      Result Value Ref Range Status   Specimen Description BLOOD RIGHT ARM   Final   Special Requests BOTTLES DRAWN AEROBIC AND ANAEROBIC 6CC   Final   Culture  Setup Time     Final   Value: 06/16/2014 17:07     Performed at [facility]   Culture     Final   Value:        BLOOD CULTURE RECEIVED NO GROWTH TO DATE CULTURE WILL BE HELD FOR 5 DAYS BEFORE ISSUING A FINAL NEGATIVE REPORT     Performed at [facility]   Report Status PENDING   Incomplete  URINE CULTURE     Status: None   Collection Time    06/16/14  9:44 PM      Result Value Ref Range Status   Specimen Description URINE, CLEAN CATCH   Final   Special Requests NONE   Final   Culture  Setup Time     Final   Value: 06/17/2014 04:49     Performed at [facility]   Colony Count     Final   Value: NO GROWTH     Performed at [facility]   Culture     Final   Value: NO GROWTH     Performed at [facility]   Report Status 06/18/2014 FINAL   Final  SURGICAL PCR SCREEN     Status: Abnormal   Collection Time    06/17/14  7:50 AM      Result Value Ref Range Status   MRSA, PCR INVALID RESULTS, SPECIMEN SENT FOR CULTURE (*) NEGATIVE Final   Comment: RESULT CALLED TO, READ BACK BY AND VERIFIED WITH:     FARGO,A @ 1048 ON [id number] BY POTEAT,S   Staphylococcus aureus INVALID RESULTS, SPECIMEN SENT FOR CULTURE (*) NEGATIVE Final   Comment: RESULT CALLED TO, READ BACK BY AND VERIFIED WITH:     FARGO,A @ 1048 ON [id number] BY POTEAT,S                The Xpert SA Assay (FDA     approved for NASAL specimens     in patients over 57 years of age),     is one component of     a comprehensive surveillance     program.  Test  performance has     been validated by [employer] for patients greater     than or equal to 78 year old.     It is not intended     to diagnose infection nor to     guide or monitor treatment.  MRSA CULTURE     Status: None   Collection Time    06/17/14  7:50 AM      Result Value Ref Range Status   Specimen Description NOSE   Final   Special Requests NONE   Final   Culture     Final   Value: Culture reincubated for better growth     Performed at [facility]   Report Status PENDING   Incomplete  ANAEROBIC CULTURE     Status: None   Collection Time    06/17/14 10:47 AM      Result Value Ref Range Status   Specimen Description FLUID LEFT KNEE   Final   Special Requests NONE   Final   Gram Stain     Final   Value: ABUNDANT WBC PRESENT, PREDOMINANTLY PMN     NO SQUAMOUS EPITHELIAL CELLS SEEN     FEW GRAM POSITIVE COCCI     IN CLUSTERS Performed by [facility]     Performed at [facility]   Culture     Final   Value: NO ANAEROBES ISOLATED; CULTURE IN PROGRESS FOR 5 DAYS     Note: Gram Stain Report Called to,Read Back By and Verified With: DR HANDY AT 1135 ON 09.09.15 BY SHUEA     Performed at [facility]   Report Status PENDING   Incomplete  BODY FLUID CULTURE     Status: None   Collection Time  06/17/14 10:47 AM      Result Value Ref Range Status   Specimen Description FLUID LEFT KNEE   Final   Special Requests NONE   Final   Gram Stain     Final   Value: ABUNDANT WBC PRESENT, PREDOMINANTLY PMN     FEW GRAM POSITIVE COCCI     IN CLUSTERS Performed by [facility]     Performed at [facility]   Culture     Final   Value: MODERATE STAPHYLOCOCCUS AUREUS     Note: RIFAMPIN AND GENTAMICIN SHOULD NOT BE USED AS SINGLE DRUGS FOR TREATMENT OF STAPH INFECTIONS.     Note: Gram Stain Report Called to,Read Back By and Verified With: DR HANDY AT 1135 ON 09.09.15 BY SHUEA     Performed at [facility]   Report Status  PENDING   Incomplete  GRAM STAIN     Status: None   Collection Time    06/17/14 10:47 AM      Result Value Ref Range Status   Specimen Description FLUID LEFT KNEE   Final   Special Requests NONE   Final   Gram Stain     Final   Value: ABUNDANT WBC PRESENT, PREDOMINANTLY PMN     FEW GRAM POSITIVE COCCI IN CLUSTERS     Gram Stain Report Called to,Read Back By and Verified With: DR. HANDY AT 1135 ON 09.09.15 BY SHUEA   Report Status 06/17/2014 FINAL   Final   Knee culture is growing staph aureus with susceptibilities pending  Assessment: He has staph aureus infection following patellar fracture and open reduction and internal fixation. I will continue vancomycin pending final antibiotic susceptibilities. I plan on at least 4 weeks of IV antibiotic therapy.  Plan: 1. Continue vancomycin for now. I will check cultures this weekend and adjust antibiotics as needed. Please call for any other infectious disease questions.  Cliffton Asters, MD [facility] [phone] pager   [phone] cell 06/19/2014, 10:09 AM

## 2014-06-19 NOTE — Anesthesia Preprocedure Evaluation (Signed)
Anesthesia Evaluation  Patient identified by MRN, date of birth, ID band Patient awake    Reviewed: Allergy & Precautions, H&P , NPO status , Patient's Chart, lab work & pertinent test results  Airway Mallampati: II TM Distance: >3 FB Neck ROM: Full    Dental no notable dental hx.    Pulmonary neg pulmonary ROS,  breath sounds clear to auscultation  Pulmonary exam normal       Cardiovascular negative cardio ROS  Rhythm:Regular Rate:Normal     Neuro/Psych negative neurological ROS  negative psych ROS   GI/Hepatic negative GI ROS, Neg liver ROS,   Endo/Other  negative endocrine ROS  Renal/GU negative Renal ROS  negative genitourinary   Musculoskeletal negative musculoskeletal ROS (+)   Abdominal   Peds negative pediatric ROS (+)  Hematology  (+) anemia ,   Anesthesia Other Findings   Reproductive/Obstetrics negative OB ROS                           Anesthesia Physical Anesthesia Plan  ASA: II  Anesthesia Plan: General   Post-op Pain Management:    Induction: Intravenous  Airway Management Planned: LMA  Additional Equipment:   Intra-op Plan:   Post-operative Plan:   Informed Consent: I have reviewed the patients History and Physical, chart, labs and discussed the procedure including the risks, benefits and alternatives for the proposed anesthesia with the patient or authorized representative who has indicated his/her understanding and acceptance.   Dental advisory given  Plan Discussed with: CRNA and Surgeon  Anesthesia Plan Comments:         Anesthesia Quick Evaluation

## 2014-06-19 NOTE — Anesthesia Postprocedure Evaluation (Signed)
  Anesthesia Post-op Note  Patient: Brian Atkinson  Procedure(s) Performed: Procedure(s) (LRB): DEBRIDEMENT AND CLOSURE LEFT KNEE (Left)  Patient Location: PACU  Anesthesia Type: General  Level of Consciousness: awake and alert   Airway and Oxygen Therapy: Patient Spontanous Breathing  Post-op Pain: mild  Post-op Assessment: Post-op Vital signs reviewed, Patient's Cardiovascular Status Stable, Respiratory Function Stable, Patent Airway and No signs of Nausea or vomiting  Last Vitals:  Filed Vitals:   06/19/14 1609  BP: 162/92  Pulse: 106  Temp: 37 C  Resp: 15    Post-op Vital Signs: stable   Complications: No apparent anesthesia complications

## 2014-06-20 DIAGNOSIS — K5900 Constipation, unspecified: Secondary | ICD-10-CM | POA: Diagnosis present

## 2014-06-20 DIAGNOSIS — A4102 Sepsis due to Methicillin resistant Staphylococcus aureus: Principal | ICD-10-CM

## 2014-06-20 LAB — CBC WITH DIFFERENTIAL/PLATELET
BASOS ABS: 0 10*3/uL (ref 0.0–0.1)
BASOS PCT: 0 % (ref 0–1)
EOS PCT: 0 % (ref 0–5)
Eosinophils Absolute: 0 10*3/uL (ref 0.0–0.7)
HEMATOCRIT: 31.3 % — AB (ref 39.0–52.0)
Hemoglobin: 10.5 g/dL — ABNORMAL LOW (ref 13.0–17.0)
Lymphocytes Relative: 6 % — ABNORMAL LOW (ref 12–46)
Lymphs Abs: 0.9 10*3/uL (ref 0.7–4.0)
MCH: 30.3 pg (ref 26.0–34.0)
MCHC: 33.5 g/dL (ref 30.0–36.0)
MCV: 90.2 fL (ref 78.0–100.0)
Monocytes Absolute: 0.9 10*3/uL (ref 0.1–1.0)
Monocytes Relative: 6 % (ref 3–12)
NEUTROS ABS: 13.7 10*3/uL — AB (ref 1.7–7.7)
Neutrophils Relative %: 88 % — ABNORMAL HIGH (ref 43–77)
Platelets: 331 10*3/uL (ref 150–400)
RBC: 3.47 MIL/uL — ABNORMAL LOW (ref 4.22–5.81)
RDW: 12.5 % (ref 11.5–15.5)
WBC: 15.6 10*3/uL — ABNORMAL HIGH (ref 4.0–10.5)

## 2014-06-20 LAB — BODY FLUID CULTURE

## 2014-06-20 LAB — VANCOMYCIN, TROUGH: Vancomycin Tr: 6.7 ug/mL — ABNORMAL LOW (ref 10.0–20.0)

## 2014-06-20 LAB — MYOGLOBIN, URINE: Myoglobin, Ur: 79 mcg/L — ABNORMAL HIGH (ref <28)

## 2014-06-20 MED ORDER — VANCOMYCIN HCL 10 G IV SOLR
1500.0000 mg | Freq: Two times a day (BID) | INTRAVENOUS | Status: DC
  Administered 2014-06-21 – 2014-06-23 (×6): 1500 mg via INTRAVENOUS
  Filled 2014-06-20 (×6): qty 1500

## 2014-06-20 NOTE — Progress Notes (Signed)
[facility] PROGRESS NOTE  Brian Atkinson ZOX:[id number] DOB: 09-01-1951 DOA: 06/16/2014 PCP: No PCP Per Patient  Assessment/Plan:  #1 septic left knee joint ( MRSA) Patient with recent ORIF of the left patellar 06/12/2014 presented with left knee pain, swelling, erythema, tenderness to palpation. Patient noted to be febrile with temp as high as 102.7 on the day of admission. Patient is status post I and D with placement of wound VAC on 06/17/2014. Patient status post repeat I and D with delayed closure and removal of wound VAC yesterday. Preliminary wound cultures growing MRSA, sensitivities pending. Continue empiric IV vancomycin. ID following and recommended, patient likely will need at least 4 weeks of IV antibiotic therapy. ID following and appreciate input and recommendations. Orthopedics followed and I appreciate input and recommendations.  #2 sepsis secondary to MRSA septic knee joint Secondary to problem #1. Patient noted to have a Tmax of 100.3 last night with heart rate as high as 106. CBC with a white count of 24.2 on admission and now 15.6. Patient status post ORIF for left patellar fracture on 06/12/2014 now presenting with left knee swelling, erythema, warmth, tenderness to palpation with noted purulent drainage. Blood cultures are pending. Urinalysis was negative. Patient has been assessed by orthopedics and patient s/p I and D with placement of wound vac 06/17/14. Patient with repeat I and D yesterday with wound VAC removal and closure. Wound cultures pending with preliminary cultures growing MRSA, sensitivities pending. Continue empiric IV vancomycin. ID following and appreciate input and recommendations. Per ID patient will likely need 4-6 weeks of IV antibiotics. PICC line has been placed.  #3 fever/leukocytosis Likely secondary to problem #1 and 2. Blood cultures are pending. Urinalysis was negative. Preliminary wound cultures with MRSA, sensitivities pending. Patient is status  post I and a D x 2 with placement and subsequent removal of wound VAC. Continue empiric IV vancomycin. ID following and appreciate input and recommendations.   #4 acute blood loss anemia/postoperative acute blood loss anemia/severe iron deficiency anemia Anemia panel consistent with severe iron deficiency anemia. Patient was given IV iron. Follow H&H. Patient is a 63 and due to history of severe iron deficiency anemia may benefit from a screening colonoscopy as outpatient. Continue iron supplementation. Transfusion threshold hemoglobin less than 7.  #5 hyponatremia Likely secondary to hypovolemic hyponatremia. Improved with hydration. NSL IVF. Follow.  #6 uncontrolled pain Pain management.  #7 elevated blood pressure Likely secondary to pain. Patient not on antihypertensives at home. Improved. Pain management.  #8 Constipation Patient with BM yesterday. Continue current bowel regimen.  #9 prophylaxis Lovenox for DVT prophylaxis.  Code Status: Full Family Communication: Updated patient and wife at bedside. Disposition Plan: probably SNF versus home when medically stable   Consultants:  Orthopedics: Dr Michelene Gardener, PA 06/16/2014  Procedures:  Chest x-ray 06/16/2014  X-ray of the left knee 06/17/2014  I and D of left knee wound/Arthrotomy and lavage of left knee joint/aappreciation of small wound VAC 06/17/2014 Dr. Carola Frost  Repeat I and D of left knee joint, delayed primary closure of left knee wound. 06/19/14 Dr Magnus Ivan  PICC line 06/19/2014 Antibiotics:  IV vancomycin 06/17/2014  IV Zosyn  06/17/2014>>>>> 06/18/2014  HPI/Subjective: Patient with no complaints. Patient states pain being managed, and muscle relaxant helping the most.  Objective: Filed Vitals:   06/20/14 0546  BP: 107/77  Pulse: 75  Temp: 98 F (36.7 C)  Resp: 20    Intake/Output Summary (Last 24 hours) at 06/20/14 0951 Last data filed at 06/20/14  1610  Gross per 24 hour  Intake   2125 ml   Output   1180 ml  Net    945 ml   Filed Weights   06/16/14 1100  Weight: 102.059 kg (225 lb)    Exam:   General:  NAD  Cardiovascular:  RRR.  Respiratory: Clear to auscultation bilaterally  Abdomen: Soft, nontender, nondistended, positive bowel sounds.  Musculoskeletal: No clubbing cyanosis. Left knee with swelling, less erythemous, less warmth.  Data Reviewed: Basic Metabolic Panel:  Recent Labs Lab 06/17/14 0425 06/18/14 0418 06/19/14 0433  NA 129* 132* 134*  K 4.1 4.0 3.6*  CL 92* 96 96  CO2 GLUCOSE 109* 112* 109*  BUN CREATININE 0.89 0.96 0.85  CALCIUM 8.6 8.1* 8.1*  MG  --  1.9  --    Liver Function Tests:  Recent Labs Lab 06/17/14 0425  AST 13  ALT 10  ALKPHOS 74  BILITOT 1.0  PROT 7.3  ALBUMIN 2.7*   No results found for this basename: LIPASE, AMYLASE,  in the last 168 hours No results found for this basename: AMMONIA,  in the last 168 hours CBC:  Recent Labs Lab 06/17/14 0425 06/18/14 0418 06/19/14 0433 06/20/14 0350  WBC 24.2* 18.8* 16.0* 15.6*  NEUTROABS  --  16.1* 12.5* 13.7*  HGB 12.4* 10.0* 9.9* 10.5*  HCT 37.5* 29.6* 30.2* 31.3*  MCV 90.8 89.7 91.0 90.2  PLT 276 271 313 331   Cardiac Enzymes: No results found for this basename: CKTOTAL, CKMB, CKMBINDEX, TROPONINI,  in the last 168 hours BNP (last 3 results) No results found for this basename: PROBNP,  in the last 8760 hours CBG: No results found for this basename: GLUCAP,  in the last 168 hours  Recent Results (from the past 240 hour(s))  CULTURE, BLOOD (ROUTINE X 2)     Status: None   Collection Time    06/16/14 12:50 PM      Result Value Ref Range Status   Specimen Description BLOOD LEFT WRIST   Final   Special Requests BOTTLES DRAWN AEROBIC ONLY 5CC   Final   Culture  Setup Time     Final   Value: 06/16/2014 17:08     Performed at [facility]   Culture     Final   Value:        BLOOD CULTURE RECEIVED NO GROWTH TO DATE CULTURE WILL BE  HELD FOR 5 DAYS BEFORE ISSUING A FINAL NEGATIVE REPORT     Performed at [facility]   Report Status PENDING   Incomplete  CULTURE, BLOOD (ROUTINE X 2)     Status: None   Collection Time    06/16/14 12:50 PM      Result Value Ref Range Status   Specimen Description BLOOD RIGHT ARM   Final   Special Requests BOTTLES DRAWN AEROBIC AND ANAEROBIC 6CC   Final   Culture  Setup Time     Final   Value: 06/16/2014 17:07     Performed at [facility]   Culture     Final   Value:        BLOOD CULTURE RECEIVED NO GROWTH TO DATE CULTURE WILL BE HELD FOR 5 DAYS BEFORE ISSUING A FINAL NEGATIVE REPORT     Performed at [facility]   Report Status PENDING   Incomplete  URINE CULTURE     Status: None   Collection Time    06/16/14  9:44 PM      Result Value Ref Range Status   Specimen Description URINE, CLEAN CATCH   Final   Special Requests NONE   Final   Culture  Setup Time     Final   Value: 06/17/2014 04:49     Performed at [facility]   Colony Count     Final   Value: NO GROWTH     Performed at [facility]   Culture     Final   Value: NO GROWTH     Performed at [facility]   Report Status 06/18/2014 FINAL   Final  SURGICAL PCR SCREEN     Status: Abnormal   Collection Time    06/17/14  7:50 AM      Result Value Ref Range Status   MRSA, PCR INVALID RESULTS, SPECIMEN SENT FOR CULTURE (*) NEGATIVE Final   Comment: RESULT CALLED TO, READ BACK BY AND VERIFIED WITH:     FARGO,A @ 1048 ON [id number] BY POTEAT,S   Staphylococcus aureus INVALID RESULTS, SPECIMEN SENT FOR CULTURE (*) NEGATIVE Final   Comment: RESULT CALLED TO, READ BACK BY AND VERIFIED WITH:     FARGO,A @ 1048 ON [id number] BY POTEAT,S                The Xpert SA Assay (FDA     approved for NASAL specimens     in patients over 77 years of age),     is one component of     a comprehensive surveillance     program.  Test performance has     been validated by [employer] for  patients greater     than or equal to 59 year old.     It is not intended     to diagnose infection nor to     guide or monitor treatment.  MRSA CULTURE     Status: None   Collection Time    06/17/14  7:50 AM      Result Value Ref Range Status   Specimen Description NOSE   Final   Special Requests NONE   Final   Culture     Final   Value: MODERATE STAPHYLOCOCCUS AUREUS     Note: NOMRSA     Performed at [facility]   Report Status 06/19/2014 FINAL   Final  ANAEROBIC CULTURE     Status: None   Collection Time    06/17/14 10:47 AM      Result Value Ref Range Status   Specimen Description FLUID LEFT KNEE   Final   Special Requests NONE   Final   Gram Stain     Final   Value: ABUNDANT WBC PRESENT, PREDOMINANTLY PMN     NO SQUAMOUS EPITHELIAL CELLS SEEN     FEW GRAM POSITIVE COCCI     IN CLUSTERS Performed by [facility]     Performed at [facility]   Culture     Final   Value: NO ANAEROBES ISOLATED; CULTURE IN PROGRESS FOR 5 DAYS     Note: Gram Stain Report Called to,Read Back By and Verified With: DR HANDY AT 1135 ON 09.09.15 BY SHUEA     Performed at [facility]   Report Status PENDING   Incomplete  BODY FLUID CULTURE     Status: None   Collection Time    06/17/14 10:47 AM      Result Value  Ref Range Status   Specimen Description FLUID LEFT KNEE   Final   Special Requests NONE   Final   Gram Stain     Final   Value: ABUNDANT WBC PRESENT, PREDOMINANTLY PMN     FEW GRAM POSITIVE COCCI     IN CLUSTERS Performed by [facility]     Performed at [facility]   Culture     Final   Value: MODERATE METHICILLIN RESISTANT STAPHYLOCOCCUS AUREUS     Note: RIFAMPIN AND GENTAMICIN SHOULD NOT BE USED AS SINGLE DRUGS FOR TREATMENT OF STAPH INFECTIONS. CRITICAL RESULT CALLED TO, READ BACK BY AND VERIFIED WITH: AMY@2 :26PM ON 06/19/14 BY DANTS     Note: Gram Stain Report Called to,Read Back By and Verified With: DR HANDY AT 1135 ON 09.09.15  BY SHUEA     Performed at [facility]   Report Status PENDING   Incomplete  GRAM STAIN     Status: None   Collection Time    06/17/14 10:47 AM      Result Value Ref Range Status   Specimen Description FLUID LEFT KNEE   Final   Special Requests NONE   Final   Gram Stain     Final   Value: ABUNDANT WBC PRESENT, PREDOMINANTLY PMN     FEW GRAM POSITIVE COCCI IN CLUSTERS     Gram Stain Report Called to,Read Back By and Verified With: DR. HANDY AT 1135 ON 09.09.15 BY SHUEA   Report Status 06/17/2014 FINAL   Final     Studies: Dg Abd 1 View  06/18/2014   CLINICAL DATA:  Constipation and abdominal distention  EXAM: ABDOMEN - 1 VIEW  COMPARISON:  None.  FINDINGS: There is diffuse stool throughout colon. Bowel gas pattern is unremarkable. No obstruction or free air is seen on this supine examination. There are no abnormal calcifications.  IMPRESSION: Diffuse stool throughout colon.  Bowel gas pattern unremarkable.   Electronically Signed   By: Bretta Bang M.D.   On: 06/18/2014 16:53    Scheduled Meds: . enoxaparin (LOVENOX) injection  40 mg Subcutaneous Q24H  . ferrous sulfate  325 mg Oral TID WC  . methocarbamol  1,000 mg Oral QID  . polyethylene glycol  17 g Oral BID  . senna-docusate  2 tablet Oral QHS  . vancomycin  1,000 mg Intravenous Q12H   Continuous Infusions:    Principal Problem:   Septic joint of left knee joint Active Problems:   Sepsis   Patella fracture   Hyponatremia   Uncontrolled pain   Fever, unspecified   Postoperative anemia due to acute blood loss   Iron deficiency anemia   Unspecified constipation    Time spent: 35 mins    [facility] MD [facility] Pager [phone]. If 7PM-7AM, please contact night-coverage at [email or web address], password [facility] 06/20/2014, 9:51 AM  LOS: 4 days
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
## 2014-06-20 NOTE — Progress Notes (Signed)
Subjective: 1 Day Post-Op Procedure(s) (LRB): DEBRIDEMENT AND CLOSURE LEFT KNEE (Left) Patient reports pain as mild.  Overall doing well from ortho standpoint.  Objective: Vital signs in last 24 hours: Temp:  [97.6 F (36.4 C)-100.3 F (37.9 C)] 98 F (36.7 C) (09/12 0546) Pulse Rate:  [59-106] 75 (09/12 0546) Resp:  [15-20] 20 (09/12 0546) BP: (107-162)/(60-94) 107/77 mmHg (09/12 0546) SpO2:  [91 %-100 %] 98 % (09/12 0546)  Intake/Output from previous day: 09/11 0701 - 09/12 0700 In: 1645 [P.O.:120; I.V.:1325; IV Piggyback:200] Out: 1190 [Urine:1175; Drains:15] Intake/Output this shift:     Recent Labs  06/18/14 0418 06/19/14 0433 06/20/14 0350  HGB 10.0* 9.9* 10.5*    Recent Labs  06/19/14 0433 06/20/14 0350  WBC 16.0* 15.6*  RBC 3.32* 3.47*  HCT 30.2* 31.3*  PLT 313 331    Recent Labs  06/18/14 0418 06/19/14 0433  NA 132* 134*  K 4.0 3.6*  CL 96 96  CO2 26 26  BUN 10 9  CREATININE 0.96 0.85  GLUCOSE 112* 109*  CALCIUM 8.1* 8.1*   No results found for this basename: LABPT, INR,  in the last 72 hours  Neurovascular intact Sensation intact distally Intact pulses distally Dorsiflexion/Plantar flexion intact Incision: scant drainage Compartment soft  Assessment/Plan: 1 Day Post-Op Procedure(s) (LRB): DEBRIDEMENT AND CLOSURE LEFT KNEE (Left) Up with therapy Monitor WBC   CLARK, GILBERT 06/20/2014, 8:37 AM

## 2014-06-20 NOTE — Progress Notes (Signed)
PHARMACY - VANCOMYCIN  Pt on Vancomycin  IV q12h for + MRSA knee infection  Vancomycin trough = 6.7 mcg/ml (goal 15-20 mcg/ml)  PLAN:  Increase Vancomycin to  IV q12h             Recheck Vanc trough when at steady state level  Terrilee Files, PharmD 06/20/14 @ 23:59
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
## 2014-06-20 NOTE — Progress Notes (Signed)
Patient ID: Brian Atkinson, male   DOB: 1951-06-01, 63 y.o.   MRN: [id number]         [facility]    Date of Admission:  06/16/2014           Day 4 vancomycin Principal Problem:   Septic joint of left knee joint Active Problems:   Patella fracture   Sepsis   Hyponatremia   Uncontrolled pain   Fever, unspecified   Postoperative anemia due to acute blood loss   Iron deficiency anemia   Unspecified constipation   . enoxaparin (LOVENOX) injection  40 mg Subcutaneous Q24H  . ferrous sulfate  325 mg Oral TID WC  . methocarbamol  1,000 mg Oral QID  . polyethylene glycol  17 g Oral BID  . senna-docusate  2 tablet Oral QHS  . vancomycin  1,000 mg Intravenous Q12H   Objective: Temp:  [97.6 F (36.4 C)-100.3 F (37.9 C)] 98 F (36.7 C) (09/12 0546) Pulse Rate:  [59-106] 75 (09/12 0546) Resp:  [15-20] 20 (09/12 0546) BP: (107-162)/(60-94) 107/77 mmHg (09/12 0546) SpO2:  [91 %-100 %] 98 % (09/12 0546)  Lab Results Lab Results  Component Value Date   WBC 15.6* 06/20/2014   HGB 10.5* 06/20/2014   HCT 31.3* 06/20/2014   MCV 90.2 06/20/2014   PLT 331 06/20/2014    Lab Results  Component Value Date   CREATININE 0.85 06/19/2014   BUN 9 06/19/2014   NA 134* 06/19/2014   K 3.6* 06/19/2014   CL 96 06/19/2014   CO2 26 06/19/2014    Lab Results  Component Value Date   ALT 10 06/17/2014   AST 13 06/17/2014   ALKPHOS 74 06/17/2014   BILITOT 1.0 06/17/2014      Microbiology: Recent Results (from the past 240 hour(s))  CULTURE, BLOOD (ROUTINE X 2)     Status: None   Collection Time    06/16/14 12:50 PM      Result Value Ref Range Status   Specimen Description BLOOD LEFT WRIST   Final   Special Requests BOTTLES DRAWN AEROBIC ONLY 5CC   Final   Culture  Setup Time     Final   Value: 06/16/2014 17:08     Performed at [facility]   Culture     Final   Value:        BLOOD CULTURE RECEIVED NO GROWTH TO DATE CULTURE WILL BE HELD FOR 5 DAYS BEFORE ISSUING A FINAL  NEGATIVE REPORT     Performed at [facility]   Report Status PENDING   Incomplete  CULTURE, BLOOD (ROUTINE X 2)     Status: None   Collection Time    06/16/14 12:50 PM      Result Value Ref Range Status   Specimen Description BLOOD RIGHT ARM   Final   Special Requests BOTTLES DRAWN AEROBIC AND ANAEROBIC 6CC   Final   Culture  Setup Time     Final   Value: 06/16/2014 17:07     Performed at [facility]   Culture     Final   Value:        BLOOD CULTURE RECEIVED NO GROWTH TO DATE CULTURE WILL BE HELD FOR 5 DAYS BEFORE ISSUING A FINAL NEGATIVE REPORT     Performed at [facility]   Report Status PENDING   Incomplete  URINE CULTURE     Status: None   Collection Time    06/16/14  9:44 PM      Result Value Ref Range Status   Specimen Description URINE, CLEAN CATCH   Final   Special Requests NONE   Final   Culture  Setup Time     Final   Value: 06/17/2014 04:49     Performed at [facility]   Colony Count     Final   Value: NO GROWTH     Performed at [facility]   Culture     Final   Value: NO GROWTH     Performed at [facility]   Report Status 06/18/2014 FINAL   Final  SURGICAL PCR SCREEN     Status: Abnormal   Collection Time    06/17/14  7:50 AM      Result Value Ref Range Status   MRSA, PCR INVALID RESULTS, SPECIMEN SENT FOR CULTURE (*) NEGATIVE Final   Comment: RESULT CALLED TO, READ BACK BY AND VERIFIED WITH:     FARGO,A @ 1048 ON [id number] BY POTEAT,S   Staphylococcus aureus INVALID RESULTS, SPECIMEN SENT FOR CULTURE (*) NEGATIVE Final   Comment: RESULT CALLED TO, READ BACK BY AND VERIFIED WITH:     FARGO,A @ 1048 ON [id number] BY POTEAT,S                The Xpert SA Assay (FDA     approved for NASAL specimens     in patients over 15 years of age),     is one component of     a comprehensive surveillance     program.  Test performance has     been validated by [employer] for patients greater     than or equal to 28  year old.     It is not intended     to diagnose infection nor to     guide or monitor treatment.  MRSA CULTURE     Status: None   Collection Time    06/17/14  7:50 AM      Result Value Ref Range Status   Specimen Description NOSE   Final   Special Requests NONE   Final   Culture     Final   Value: MODERATE STAPHYLOCOCCUS AUREUS     Note: NOMRSA     Performed at [facility]   Report Status 06/19/2014 FINAL   Final  ANAEROBIC CULTURE     Status: None   Collection Time    06/17/14 10:47 AM      Result Value Ref Range Status   Specimen Description FLUID LEFT KNEE   Final   Special Requests NONE   Final   Gram Stain     Final   Value: ABUNDANT WBC PRESENT, PREDOMINANTLY PMN     NO SQUAMOUS EPITHELIAL CELLS SEEN     FEW GRAM POSITIVE COCCI     IN CLUSTERS Performed by [facility]     Performed at [facility]   Culture     Final   Value: NO ANAEROBES ISOLATED; CULTURE IN PROGRESS FOR 5 DAYS     Note: Gram Stain Report Called to,Read Back By and Verified With: DR HANDY AT 1135 ON 09.09.15 BY SHUEA     Performed at [facility]   Report Status PENDING   Incomplete  BODY FLUID CULTURE     Status: None   Collection Time    06/17/14 10:47 AM      Result Value  Ref Range Status   Specimen Description FLUID LEFT KNEE   Final   Special Requests NONE   Final   Gram Stain     Final   Value: ABUNDANT WBC PRESENT, PREDOMINANTLY PMN     FEW GRAM POSITIVE COCCI     IN CLUSTERS Performed by [facility]     Performed at [facility]   Culture     Final   Value: MODERATE METHICILLIN RESISTANT STAPHYLOCOCCUS AUREUS     Note: RIFAMPIN AND GENTAMICIN SHOULD NOT BE USED AS SINGLE DRUGS FOR TREATMENT OF STAPH INFECTIONS. CRITICAL RESULT CALLED TO, READ BACK BY AND VERIFIED WITH: AMY@2 :26PM ON 06/19/14 BY DANTS     Note: Gram Stain Report Called to,Read Back By and Verified With: DR HANDY AT 1135 ON 09.09.15 BY SHUEA     Performed at [facility]   Report Status PENDING   Incomplete  GRAM STAIN     Status: None   Collection Time    06/17/14 10:47 AM      Result Value Ref Range Status   Specimen Description FLUID LEFT KNEE   Final   Special Requests NONE   Final   Gram Stain     Final   Value: ABUNDANT WBC PRESENT, PREDOMINANTLY PMN     FEW GRAM POSITIVE COCCI IN CLUSTERS     Gram Stain Report Called to,Read Back By and Verified With: DR. HANDY AT 1135 ON 09.09.15 BY SHUEA   Report Status 06/17/2014 FINAL   Final   Assessment: He has MRSA infection following patellar fracture and open reduction and internal fixation. I will continue vancomycin at least 4 weeks.  Plan: 1. Continue vancomycin  2. I will followup on Monday but please call me if there are other infectious disease questions this weekend  Cliffton Asters, MD [facility] [phone] pager   [phone] cell 06/20/2014, 11:59 AM

## 2014-06-20 NOTE — Progress Notes (Signed)
NCM spoke to pt and offered choice for [facility]. Explained NCM will follow up with [facility] for referral for [facility] IV abx. Isidoro Donning RN CCM Case Mgmt phone [phone]

## 2014-06-21 DIAGNOSIS — K5900 Constipation, unspecified: Secondary | ICD-10-CM

## 2014-06-21 LAB — BASIC METABOLIC PANEL
ANION GAP: 10 (ref 5–15)
BUN: 15 mg/dL (ref 6–23)
CALCIUM: 8.2 mg/dL — AB (ref 8.4–10.5)
CO2: 26 mEq/L (ref 19–32)
Chloride: 101 mEq/L (ref 96–112)
Creatinine, Ser: 0.82 mg/dL (ref 0.50–1.35)
GFR calc Af Amer: >90 mL/min (ref >90)
Glucose, Bld: 97 mg/dL (ref 70–99)
Potassium: 3.8 mEq/L (ref 3.7–5.3)
SODIUM: 137 meq/L (ref 137–147)

## 2014-06-21 LAB — CBC
HCT: 28.8 % — ABNORMAL LOW (ref 39.0–52.0)
Hemoglobin: 9.8 g/dL — ABNORMAL LOW (ref 13.0–17.0)
MCH: 30.2 pg (ref 26.0–34.0)
MCHC: 34 g/dL (ref 30.0–36.0)
MCV: 88.6 fL (ref 78.0–100.0)
PLATELETS: 355 10*3/uL (ref 150–400)
RBC: 3.25 MIL/uL — ABNORMAL LOW (ref 4.22–5.81)
RDW: 12.7 % (ref 11.5–15.5)
WBC: 16.2 10*3/uL — ABNORMAL HIGH (ref 4.0–10.5)

## 2014-06-21 NOTE — Progress Notes (Signed)
*  PRELIMINARY RESULTS* Vascular Ultrasound Right upper extremity venous duplex has been completed.  Preliminary findings: no evidence of deep or superficial thrombosis. Although veins directly under PICC bandages cannot be evaluated.    Farrel Demark, RDMS, RVT  06/21/2014, 8:14 AM

## 2014-06-21 NOTE — Progress Notes (Signed)
Subjective: 2 Days Post-Op Procedure(s) (LRB): DEBRIDEMENT AND CLOSURE LEFT KNEE (Left) Patient reports pain as mild.  States he feels much improved.  Objective: Vital signs in last 24 hours: Temp:  [98.6 F (37 C)] 98.6 F (37 C) (09/13 0544) Pulse Rate:  [74-76] 76 (09/13 0544) Resp:  [16-18] 18 (09/13 0544) BP: (113-122)/(68-69) 122/69 mmHg (09/13 0544) SpO2:  [95 %-100 %] 95 % (09/13 0544)  Intake/Output from previous day: 09/12 0701 - 09/13 0700 In: 2341.3 [P.O.:1200; I.V.:641.3; IV Piggyback:500] Out: 3100 [Urine:3100] Intake/Output this shift: Total I/O In: 240 [P.O.:240] Out: 475 [Urine:475]   Recent Labs  06/19/14 0433 06/20/14 0350 06/21/14 0459  HGB 9.9* 10.5* 9.8*    Recent Labs  06/20/14 0350 06/21/14 0459  WBC 15.6* 16.2*  RBC 3.47* 3.25*  HCT 31.3* 28.8*  PLT 331 355    Recent Labs  06/19/14 0433 06/21/14 0459  NA 134* 137  K 3.6* 3.8  CL 96 101  CO2 26 26  BUN 9 15  CREATININE 0.85 0.82  GLUCOSE 109* 97  CALCIUM 8.1* 8.2*   No results found for this basename: LABPT, INR,  in the last 72 hours  Sensation intact distally Intact pulses distally Dorsiflexion/Plantar flexion intact Incision: moderate drainage Compartment soft Right arm compartments soft Picc line site benign Assessment/Plan: 2 Days Post-Op Procedure(s) (LRB): DEBRIDEMENT AND CLOSURE LEFT KNEE (Left) Monitor WBC and vitals  Continue current care  Jerriann Schrom 06/21/2014, 10:45 AM
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
## 2014-06-21 NOTE — Progress Notes (Signed)
Dr. Janee Morn requested VAST to assess pts' RA due to report of edema during the night.  RA no longer with selling noted, wrist bands are loose- pt stated some were removed last night due to them being tight.  R radial pulse strong.  PICC site CDI without any abnormalities noted.  Dr Janee Morn aware of normal assessment.

## 2014-06-21 NOTE — Progress Notes (Signed)
I have reviewed and discussed in detail with [name] the patient's studies, examination findings, and I concur with the plan outlined above.  Myrene Galas, MD [facility] [phone] (p)
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
## 2014-06-21 NOTE — Progress Notes (Signed)
06/21/14 Nursing 0500 Dr Magnus Ivan called regarding patient's rt arm swelling below picc line. Iv team has evaluated patient. Order received for doppler study of rt upper extremity ordered.

## 2014-06-22 ENCOUNTER — Encounter (HOSPITAL_COMMUNITY): Payer: Self-pay | Admitting: Orthopaedic Surgery

## 2014-06-22 LAB — CBC WITH DIFFERENTIAL/PLATELET
Basophils Absolute: 0.2 10*3/uL — ABNORMAL HIGH (ref 0.0–0.1)
Basophils Relative: 1 % (ref 0–1)
EOS ABS: 0.4 10*3/uL (ref 0.0–0.7)
Eosinophils Relative: 2 % (ref 0–5)
HEMATOCRIT: 31.1 % — AB (ref 39.0–52.0)
Hemoglobin: 10.3 g/dL — ABNORMAL LOW (ref 13.0–17.0)
Lymphocytes Relative: 12 % (ref 12–46)
Lymphs Abs: 2.2 10*3/uL (ref 0.7–4.0)
MCH: 30.1 pg (ref 26.0–34.0)
MCHC: 33.1 g/dL (ref 30.0–36.0)
MCV: 90.9 fL (ref 78.0–100.0)
Monocytes Absolute: 1.8 10*3/uL — ABNORMAL HIGH (ref 0.1–1.0)
Monocytes Relative: 10 % (ref 3–12)
NEUTROS ABS: 13.6 10*3/uL — AB (ref 1.7–7.7)
Neutrophils Relative %: 75 % (ref 43–77)
Platelets: 407 10*3/uL — ABNORMAL HIGH (ref 150–400)
RBC: 3.42 MIL/uL — ABNORMAL LOW (ref 4.22–5.81)
RDW: 13 % (ref 11.5–15.5)
WBC: 18.2 10*3/uL — ABNORMAL HIGH (ref 4.0–10.5)

## 2014-06-22 LAB — CULTURE, BLOOD (ROUTINE X 2)
CULTURE: NO GROWTH
CULTURE: NO GROWTH

## 2014-06-22 LAB — CREATININE, SERUM
CREATININE: 0.71 mg/dL (ref 0.50–1.35)
GFR calc non Af Amer: >90 mL/min (ref >90)

## 2014-06-22 LAB — ANAEROBIC CULTURE

## 2014-06-22 LAB — VANCOMYCIN, TROUGH: Vancomycin Tr: 12 ug/mL (ref 10.0–20.0)
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
## 2014-06-22 NOTE — Progress Notes (Signed)
Patient ID: Brian Atkinson, male   DOB: 1950/11/24, 63 y.o.   MRN: [id number]         [facility]    Date of Admission:  06/16/2014           Day 6 vancomycin Principal Problem:   Septic joint of left knee joint Active Problems:   Patella fracture   Sepsis   Hyponatremia   Uncontrolled pain   Fever, unspecified   Postoperative anemia due to acute blood loss   Iron deficiency anemia   Unspecified constipation   . enoxaparin (LOVENOX) injection  40 mg Subcutaneous Q24H  . ferrous sulfate  325 mg Oral TID WC  . methocarbamol  1,000 mg Oral QID  . polyethylene glycol  17 g Oral BID  . senna-docusate  2 tablet Oral QHS  . vancomycin  1,500 mg Intravenous Q12H    Subjective: He is feeling better and his pain is under much better control. He is eager to go home today. He's not having any problems tolerating his PICC or vancomycin.  Objective: Temp:  [98.1 F (36.7 C)-98.9 F (37.2 C)] 98.1 F (36.7 C) (09/14 1400) Pulse Rate:  [86-94] 86 (09/14 1400) Resp:  [16-18] 16 (09/14 1400) BP: (101-146)/(70-78) 146/78 mmHg (09/14 1400) SpO2:  [97 %-100 %] 100 % (09/14 1400)  General: He is more alert. He appears comfortable Skin: Right arm PICC site appears normal Lungs: Clear Cor: Regular S1 and S2 with no murmurs Abdomen: Soft and nontender. No diarrhea Left leg in a soft brace  Lab Results Lab Results  Component Value Date   WBC 18.2* 06/22/2014   HGB 10.3* 06/22/2014   HCT 31.1* 06/22/2014   MCV 90.9 06/22/2014   PLT 407* 06/22/2014    Lab Results  Component Value Date   CREATININE 0.82 06/21/2014   BUN 15 06/21/2014   NA 137 06/21/2014   K 3.8 06/21/2014   CL 101 06/21/2014   CO2 26 06/21/2014    Lab Results  Component Value Date   ALT 10 06/17/2014   AST 13 06/17/2014   ALKPHOS 74 06/17/2014   BILITOT 1.0 06/17/2014      Microbiology: Recent Results (from the past 240 hour(s))  CULTURE, BLOOD (ROUTINE X 2)     Status: None   Collection Time   06/16/14 12:50 PM      Result Value Ref Range Status   Specimen Description BLOOD LEFT WRIST   Final   Special Requests BOTTLES DRAWN AEROBIC ONLY 5CC   Final   Culture  Setup Time     Final   Value: 06/16/2014 17:08     Performed at [facility]   Culture     Final   Value: NO GROWTH 5 DAYS     Performed at [facility]   Report Status 06/22/2014 FINAL   Final  CULTURE, BLOOD (ROUTINE X 2)     Status: None   Collection Time    06/16/14 12:50 PM      Result Value Ref Range Status   Specimen Description BLOOD RIGHT ARM   Final   Special Requests BOTTLES DRAWN AEROBIC AND ANAEROBIC [facility]   Final   Culture  Setup Time     Final   Value: 06/16/2014 17:07     Performed at [facility]   Culture     Final   Value: NO GROWTH 5 DAYS     Performed at [facility]   Report  Status 06/22/2014 FINAL   Final  URINE CULTURE     Status: None   Collection Time    06/16/14  9:44 PM      Result Value Ref Range Status   Specimen Description URINE, CLEAN CATCH   Final   Special Requests NONE   Final   Culture  Setup Time     Final   Value: 06/17/2014 04:49     Performed at [facility]   Colony Count     Final   Value: NO GROWTH     Performed at [facility]   Culture     Final   Value: NO GROWTH     Performed at [facility]   Report Status 06/18/2014 FINAL   Final  SURGICAL PCR SCREEN     Status: Abnormal   Collection Time    06/17/14  7:50 AM      Result Value Ref Range Status   MRSA, PCR INVALID RESULTS, SPECIMEN SENT FOR CULTURE (*) NEGATIVE Final   Comment: RESULT CALLED TO, READ BACK BY AND VERIFIED WITH:     FARGO,A @ 1048 ON [id number] BY POTEAT,S   Staphylococcus aureus INVALID RESULTS, SPECIMEN SENT FOR CULTURE (*) NEGATIVE Final   Comment: RESULT CALLED TO, READ BACK BY AND VERIFIED WITH:     FARGO,A @ 1048 ON [id number] BY POTEAT,S                The Xpert SA Assay (FDA     approved for NASAL specimens     in patients over 43  years of age),     is one component of     a comprehensive surveillance     program.  Test performance has     been validated by [employer] for patients greater     than or equal to 60 year old.     It is not intended     to diagnose infection nor to     guide or monitor treatment.  MRSA CULTURE     Status: None   Collection Time    06/17/14  7:50 AM      Result Value Ref Range Status   Specimen Description NOSE   Final   Special Requests NONE   Final   Culture     Final   Value: MODERATE STAPHYLOCOCCUS AUREUS     Note: NOMRSA     Performed at [facility]   Report Status 06/19/2014 FINAL   Final  ANAEROBIC CULTURE     Status: None   Collection Time    06/17/14 10:47 AM      Result Value Ref Range Status   Specimen Description FLUID LEFT KNEE   Final   Special Requests NONE   Final   Gram Stain     Final   Value: ABUNDANT WBC PRESENT, PREDOMINANTLY PMN     NO SQUAMOUS EPITHELIAL CELLS SEEN     FEW GRAM POSITIVE COCCI     IN CLUSTERS Performed by [facility]     Performed at [facility]   Culture     Final   Value: NO ANAEROBES ISOLATED     Note: Gram Stain Report Called to,Read Back By and Verified With: DR HANDY AT 1135 ON 09.09.15 BY SHUEA     Performed at [facility]   Report Status 06/22/2014 FINAL   Final  BODY FLUID CULTURE  Status: None   Collection Time    06/17/14 10:47 AM      Result Value Ref Range Status   Specimen Description FLUID LEFT KNEE   Final   Special Requests NONE   Final   Gram Stain     Final   Value: ABUNDANT WBC PRESENT, PREDOMINANTLY PMN     FEW GRAM POSITIVE COCCI     IN CLUSTERS Performed by [facility]     Performed at [facility]   Culture     Final   Value: MODERATE METHICILLIN RESISTANT STAPHYLOCOCCUS AUREUS     Note: RIFAMPIN AND GENTAMICIN SHOULD NOT BE USED AS SINGLE DRUGS FOR TREATMENT OF STAPH INFECTIONS. CRITICAL RESULT CALLED TO, READ BACK BY AND VERIFIED WITH:  AMY@2 :26PM ON 06/19/14 BY DANTS     Note: Gram Stain Report Called to,Read Back By and Verified With: DR HANDY AT 1135 ON 09.09.15 BY SHUEA     Performed at [facility]   Report Status 06/20/2014 FINAL   Final   Organism ID, Bacteria METHICILLIN RESISTANT STAPHYLOCOCCUS AUREUS   Final  GRAM STAIN     Status: None   Collection Time    06/17/14 10:47 AM      Result Value Ref Range Status   Specimen Description FLUID LEFT KNEE   Final   Special Requests NONE   Final   Gram Stain     Final   Value: ABUNDANT WBC PRESENT, PREDOMINANTLY PMN     FEW GRAM POSITIVE COCCI IN CLUSTERS     Gram Stain Report Called to,Read Back By and Verified With: DR. HANDY AT 1135 ON 09.09.15 BY SHUEA   Report Status 06/17/2014 FINAL   Final   Assessment: He has a nonspecific leukocytosis but otherwise is improving on therapy for postoperative MRSA left knee infection.  Plan: 1. Continue vancomycin at least 4 weeks through September 30 2. I will arrange followup in my clinic on September 30  Cliffton Asters, MD [facility] [phone] pager   [phone] cell 06/22/2014, 4:22 PM

## 2014-06-22 NOTE — Progress Notes (Signed)
Patient ID: Brian Atkinson, male   DOB: Feb 09, 1951, 64 y.o.   MRN: [id number] Mr. Onder states that he feels better and the the pain in his left knee has eased off significantly.  He remains afebrile, but his peripheral WBC has increased to 18.  His left knee dressing is changed at the bedside.  His sutures are intact and there is only scant drainage and no significant drainage.  I placed a new Aquacell dressing that can get wet in the shower.  I'll also have a new, cleaner knee immobilizer placed.  No other recs other than continuing IV Vanc and discharge on Lovenox.  Follow- up as an outpatient will be with Dr. Carola Frost.  I'll continue to follow him here until he is sent by [facility] again.

## 2014-06-23 DIAGNOSIS — D509 Iron deficiency anemia, unspecified: Secondary | ICD-10-CM

## 2014-06-23 LAB — CBC
HEMATOCRIT: 31.8 % — AB (ref 39.0–52.0)
Hemoglobin: 10.5 g/dL — ABNORMAL LOW (ref 13.0–17.0)
MCH: 29.5 pg (ref 26.0–34.0)
MCHC: 33 g/dL (ref 30.0–36.0)
MCV: 89.3 fL (ref 78.0–100.0)
PLATELETS: 497 10*3/uL — AB (ref 150–400)
RBC: 3.56 MIL/uL — ABNORMAL LOW (ref 4.22–5.81)
RDW: 13.1 % (ref 11.5–15.5)
WBC: 17.3 10*3/uL — AB (ref 4.0–10.5)

## 2014-06-23 MED ORDER — HEPARIN SOD (PORK) LOCK FLUSH 100 UNIT/ML IV SOLN
250.0000 [IU] | INTRAVENOUS | Status: AC | PRN
  Administered 2014-06-23: 250 [IU]

## 2014-06-23 MED ORDER — POLYETHYLENE GLYCOL 3350 17 G PO PACK: 17.0000 g | PACK | Freq: Two times a day (BID) | ORAL | Status: DC

## 2014-06-23 MED ORDER — SENNOSIDES-DOCUSATE SODIUM 8.6-50 MG PO TABS: 2.0000 | ORAL_TABLET | Freq: Every day | ORAL | Status: DC

## 2014-06-23 MED ORDER — VANCOMYCIN HCL 10 G IV SOLR: 1500.0000 mg | Freq: Two times a day (BID) | INTRAVENOUS | Status: DC

## 2014-06-23 MED ORDER — FERROUS SULFATE 325 (65 FE) MG PO TABS: 325.0000 mg | ORAL_TABLET | Freq: Three times a day (TID) | ORAL | Status: DC
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
## 2014-06-23 NOTE — Discharge Summary (Signed)
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 USED AS SINGLE DRUGS FOR TREATMENT OF STAPH INFECTIONS. CRITICAL RESULT CALLED TO, READ BACK BY AND VERIFIED WITH: AMY@2 :26PM ON 06/19/14 BY DANTS     Note: Gram Stain Report Called to,Read Back By and Verified With: DR HANDY AT 1135 ON 09.09.15 BY SHUEA     Performed at [facility]   Report Status 06/20/2014 FINAL   Final   Organism ID, Bacteria METHICILLIN RESISTANT STAPHYLOCOCCUS AUREUS   Final  GRAM STAIN     Status: None   Collection Time    06/17/14 10:47 AM      Result Value Ref Range Status   Specimen Description FLUID LEFT KNEE   Final   Special Requests NONE   Final   Gram Stain     Final   Value: ABUNDANT WBC PRESENT, PREDOMINANTLY PMN     FEW GRAM POSITIVE COCCI IN CLUSTERS     Gram Stain Report Called to,Read Back By and Verified With: DR. HANDY AT 1135 ON 09.09.15 BY SHUEA   Report Status 06/17/2014  FINAL   Final     Labs: Basic Metabolic Panel:  Recent Labs Lab 06/17/14 0425 06/18/14 0418 06/19/14 0433 06/21/14 0459 06/22/14 2300  NA 129* 132* 134* 137  --   K 4.1 4.0 3.6* 3.8  --   CL 92* 96 96 101  --   CO2 --   GLUCOSE 109* 112* 109*  97  --   BUN --   CREATININE 0.89 0.96 0.85 0.82 0.71  CALCIUM 8.6 8.1* 8.1* 8.2*  --   MG  --  1.9  --   --   --    Liver Function Tests:  Recent Labs Lab 06/17/14 0425  AST 13  ALT 10  ALKPHOS 74  BILITOT 1.0  PROT 7.3  ALBUMIN 2.7*   No results found for this basename: LIPASE, AMYLASE,  in the last 168 hours No results found for this basename: AMMONIA,  in the last 168 hours CBC:  Recent Labs Lab 06/17/14 0425 06/18/14 0418 06/19/14 0433 06/20/14 0350 06/21/14 0459 06/22/14 0419 06/23/14 0425  WBC 24.2* 18.8* 16.0* 15.6* 16.2* 18.2* 17.3*  NEUTROABS  --  16.1* 12.5* 13.7*  --  13.6*  --   HGB 12.4* 10.0* 9.9* 10.5* 9.8* 10.3* 10.5*  HCT 37.5* 29.6* 30.2* 31.3* 28.8* 31.1* 31.8*  MCV 90.8 89.7 91.0 90.2 88.6 90.9 89.3  PLT 276 271 313 331 355 407* 497*   Cardiac Enzymes: No results found for this basename: CKTOTAL, CKMB, CKMBINDEX, TROPONINI,  in the last 168 hours BNP: BNP (last 3 results) No results found for this basename: PROBNP,  in the last 8760 hours CBG: No results found for this basename: GLUCAP,  in the last 168 hours     Signed:  [facility] MD [facility] 06/23/2014, 11:34 AM

## 2014-06-23 NOTE — Progress Notes (Signed)
RN reviewed discharge instructions with patient and family. All questions answered. Patient stated understanding of pain medications, IV antibiotics with [place] [facility], weight bearing status, ROM restrictions, and proper use of equipment.   Patient asked about daily dressing changes supposed to start 9/14 (this information was in the discharge paperwork but there were no nursing orders written) Therefore the dressing was not changed. Patient informed RN that MD, Magnus Ivan changed dressing on Sunday 9/13.    Paperwork and prescriptions given.    NT rolled patient down in wheelchair to family car.

## 2014-06-23 NOTE — Progress Notes (Signed)
[facility]   [facility] is providing the following services: RW  If patient discharges after hours, please call [phone].   Renard Hamper 06/23/2014, 1:21 PM

## 2014-07-08 ENCOUNTER — Encounter: Payer: Self-pay | Admitting: Internal Medicine

## 2014-07-08 ENCOUNTER — Ambulatory Visit (INDEPENDENT_AMBULATORY_CARE_PROVIDER_SITE_OTHER): Payer: BC Managed Care – PPO | Admitting: Internal Medicine

## 2014-07-08 ENCOUNTER — Telehealth: Payer: Self-pay | Admitting: *Deleted

## 2014-07-08 VITALS — BP 120/78 | HR 101 | Temp 97.8°F | Wt 224.0 lb

## 2014-07-08 DIAGNOSIS — Z23 Encounter for immunization: Secondary | ICD-10-CM | POA: Diagnosis not present

## 2014-07-08 DIAGNOSIS — M009 Pyogenic arthritis, unspecified: Secondary | ICD-10-CM

## 2014-07-08 NOTE — Telephone Encounter (Signed)
Labs faxed from [place] and placed in Dr. Hale Bogus box.  Vanc trough 11.7, esr 100, and crp 0.85/
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
## 2014-07-08 NOTE — Telephone Encounter (Signed)
Verbal order per Dr. Orvan [facility]erampbell [facility]; IV antibiotic is being changed to daptomycin 6 mg/per kg daily through 08/04/14; with end date to be decided at next office visit. This is being filed through another persons Dentistinsurance claim # [id number]. Medical claim Susa LofflerLinda Murray [phone]. Wendall MolaJacqueline Nathalia Wismer

## 2014-07-08 NOTE — Telephone Encounter (Signed)
[facility] at [facility] the cost of the daptomycin is $5,000 and the patient stated that his lawyer has been handling all of the medical costs and is on vacation for a week. He would like to stay on the vancomycin until the lawyer returns and this can be worked out with [employer] insurance company.

## 2014-07-14 ENCOUNTER — Telehealth: Payer: Self-pay | Admitting: *Deleted

## 2014-07-14 NOTE — Telephone Encounter (Signed)
Yes

## 2014-07-14 NOTE — Telephone Encounter (Signed)
Larita FifeLynn, [facility], asking the question, "Continue Vanco until OV on 08/04/14 w/ Dr Orvan Falconerampbell?"  MD please advise.

## 2014-07-15 NOTE — Telephone Encounter (Signed)
Rel[facility] continuation [facility] [place] [facility].
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
## 2014-07-30 ENCOUNTER — Inpatient Hospital Stay: Payer: Self-pay | Admitting: Internal Medicine

## 2014-08-04 ENCOUNTER — Ambulatory Visit (INDEPENDENT_AMBULATORY_CARE_PROVIDER_SITE_OTHER): Payer: BC Managed Care – PPO | Admitting: Internal Medicine

## 2014-08-04 VITALS — BP 125/87 | Temp 98.2°F | Wt 228.0 lb

## 2014-08-04 DIAGNOSIS — M009 Pyogenic arthritis, unspecified: Secondary | ICD-10-CM

## 2014-08-04 MED ORDER — DOXYCYCLINE HYCLATE 100 MG PO TABS: 100.0000 mg | ORAL_TABLET | Freq: Two times a day (BID) | ORAL | Status: DC

## 2014-08-04 NOTE — Progress Notes (Signed)
Patient ID: Brian Atkinson, male   DOB: 06/11/1951, 63 y.o.   MRN: [id number]         [facility]  Patient Active Problem List   Diagnosis Date Noted  . Septic joint of left knee joint 06/18/2014    Priority: High  . Patella fracture 06/12/2014    Priority: Medium  . Unspecified constipation 06/20/2014  . Postoperative anemia due to acute blood loss 06/18/2014  . Iron deficiency anemia 06/18/2014  . Fever, unspecified 06/17/2014  . Sepsis 06/16/2014  . Hyponatremia 06/16/2014  . Uncontrolled pain 06/16/2014    Patient's Medications  New Prescriptions   DOXYCYCLINE (VIBRA-TABS) 100 MG TABLET    Take 1 tablet (100 mg total) by mouth 2 (two) times daily.  Previous Medications   FERROUS SULFATE 325 (65 FE) MG TABLET    Take 1 tablet (325 mg total) by mouth 3 (three) times daily with meals.   HYDROCODONE-ACETAMINOPHEN (NORCO) 10-325 MG PER TABLET    Take 1-2 tablets by mouth every 6 (six) hours as needed for severe pain.   METHOCARBAMOL (ROBAXIN) 500 MG TABLET    Take 1-2 tablets (500-1,000 mg total) by mouth every 6 (six) hours as needed for muscle spasms.   OXYCODONE (OXY IR/ROXICODONE) 5 MG IMMEDIATE RELEASE TABLET    Take 1-2 tablets (5-10 mg total) by mouth every 6 (six) hours as needed for breakthrough pain (take between hydrocodone for breakthrough pain).   POLYETHYLENE GLYCOL (MIRALAX / GLYCOLAX) PACKET    Take 17 g by mouth 2 (two) times daily.   SENNA-DOCUSATE (SENOKOT-S) 8.6-50 MG PER TABLET    Take 2 tablets by mouth at bedtime.  Modified Medications   No medications on file  Discontinued Medications   ENOXAPARIN (LOVENOX) 40 MG/0.4ML INJECTION    Inject 0.4 mLs (40 mg total) into the skin daily.    Subjective: Mr. [name] is in for his routine follow-up visit. He is now completed 48 days of IV vancomycin therapy for his postoperative MRSA infection at the site of his left patellar fracture. He continues to struggle with slow infusion times and  twice daily dosing of his vancomycin but otherwise has tolerated vancomycin and his PICC well. He is doing better with less pain. He is progressing in physical therapy. Review of Systems: Pertinent items are noted in HPI.  Past Medical History  Diagnosis Date  . Medical history non-contributory     History  Substance Use Topics  . Smoking status: Never Smoker   . Smokeless tobacco: Never Used  . Alcohol Use: No    No family history on file.  No Known Allergies  Objective: Temp: 98.2 F (36.8 C) (10/27 1417) Temp Source: Oral (10/27 1417) BP: 125/87 mmHg (10/27 1417)  General: He is in good spirits Skin: Left arm PICC site appears normal Left knee incision completely healed except for one tiny, superficial opening in the midportion of the incision. There is no drainage or odor    Assessment: He is improving and may be cured but I will repeat sedimentation rate and C-reactive protein today, switch him to oral doxycycline and have PICC removed. He will return in a few weeks consider stopping antibiotic therapy.  Plan: 1. Repeat sedimentation rate and C-reactive protein 2. Remove PICC 3. Doxycycline 100 mg twice daily 4. Follow-up in 3 weeks   Cliffton AstersJohn Bowen Goyal, MD [facility] [phone] pager   [phone] cell 08/04/2014, 2:47 PM

## 2014-08-13 ENCOUNTER — Encounter: Payer: Self-pay | Admitting: Internal Medicine

## 2014-08-25 ENCOUNTER — Encounter: Payer: Self-pay | Admitting: Internal Medicine

## 2014-08-25 ENCOUNTER — Ambulatory Visit (INDEPENDENT_AMBULATORY_CARE_PROVIDER_SITE_OTHER): Payer: BC Managed Care – PPO | Admitting: Internal Medicine

## 2014-08-25 VITALS — BP 128/88 | HR 87 | Temp 98.0°F | Ht 71.0 in | Wt 222.0 lb

## 2014-08-25 DIAGNOSIS — M009 Pyogenic arthritis, unspecified: Secondary | ICD-10-CM | POA: Diagnosis not present

## 2014-08-25 MED ORDER — DOXYCYCLINE HYCLATE 100 MG PO TABS: 100.0000 mg | ORAL_TABLET | Freq: Two times a day (BID) | ORAL | Status: AC

## 2014-08-25 NOTE — Progress Notes (Signed)
Patient ID: Brian BirkVictor Atkinson, male   DOB: 05-20-1951, 63 y.o.   MRN: [id number]         [facility]  Patient Active Problem List   Diagnosis Date Noted  . Septic joint of left knee joint 06/18/2014    Priority: High  . Patella fracture 06/12/2014    Priority: Medium  . Unspecified constipation 06/20/2014  . Postoperative anemia due to acute blood loss 06/18/2014  . Iron deficiency anemia 06/18/2014  . Fever, unspecified 06/17/2014  . Sepsis 06/16/2014  . Hyponatremia 06/16/2014  . Uncontrolled pain 06/16/2014    Patient's Medications  New Prescriptions   No medications on file  Previous Medications   FERROUS SULFATE 325 (65 FE) MG TABLET    Take 1 tablet (325 mg total) by mouth 3 (three) times daily with meals.   HYDROCODONE-ACETAMINOPHEN (NORCO) 10-325 MG PER TABLET    Take 1-2 tablets by mouth every 6 (six) hours as needed for severe pain.   METHOCARBAMOL (ROBAXIN) 500 MG TABLET    Take 1-2 tablets (500-1,000 mg total) by mouth every 6 (six) hours as needed for muscle spasms.   OXYCODONE (OXY IR/ROXICODONE) 5 MG IMMEDIATE RELEASE TABLET    Take 1-2 tablets (5-10 mg total) by mouth every 6 (six) hours as needed for breakthrough pain (take between hydrocodone for breakthrough pain).   POLYETHYLENE GLYCOL (MIRALAX / GLYCOLAX) PACKET    Take 17 g by mouth 2 (two) times daily.   SENNA-DOCUSATE (SENOKOT-S) 8.6-50 MG PER TABLET    Take 2 tablets by mouth at bedtime.  Modified Medications   Modified Medication Previous Medication   DOXYCYCLINE (VIBRA-TABS) 100 MG TABLET doxycycline (VIBRA-TABS) 100 MG tablet      Take 1 tablet (100 mg total) by mouth 2 (two) times daily.    Take 1 tablet (100 mg total) by mouth 2 (two) times daily.  Discontinued Medications   No medications on file    Subjective: Brian Atkinson is in for his routine follow-up visit. He has now completed 10 weeks of total antibiotic therapy for his MRSA infection at the site of his left patella  fracture. He has been on doxycycline for the past 3 weeks. He has tolerated it well. He feels like he is making slow progress with his physical therapy. Review of Systems: Pertinent items are noted in HPI.  Past Medical History  Diagnosis Date  . Medical history non-contributory     History  Substance Use Topics  . Smoking status: Never Smoker   . Smokeless tobacco: Never Used  . Alcohol Use: No    No family history on file.  No Known Allergies  Objective: Temp: 98 F (36.7 C) (11/17 1057) Temp Source: Oral (11/17 1057) BP: 128/88 mmHg (11/17 1057) Pulse Rate: 87 (11/17 1057)  General: he is smiling and in good spirits Left knee: Surgical incision is fully healed. He has some mild diffuse swelling and dusky rubor around his knee  Lab Results Sedimentation rate 08/05/2014: 17 C-reactive protein 08/05/2014: less than 0.29   Assessment: I think there is a very good chance that his infection has been cured. He will stop doxycycline when he runs out of his current supply in about 1 week.  Plan: 1. Complete 1 more week of doxycycline then stop and observe off of antibiotics 2. Follow-up in 6 weeks   Cliffton AstersJohn Muaz Shorey, MD [facility] [phone] pager   [phone] cell 08/25/2014, 11:15 AM

## 2014-09-25 ENCOUNTER — Encounter: Payer: Self-pay | Admitting: Internal Medicine

## 2014-10-13 ENCOUNTER — Encounter: Payer: Self-pay | Admitting: Internal Medicine

## 2014-10-13 ENCOUNTER — Ambulatory Visit (INDEPENDENT_AMBULATORY_CARE_PROVIDER_SITE_OTHER): Payer: No Typology Code available for payment source | Admitting: Internal Medicine

## 2014-10-13 VITALS — BP 142/86 | HR 83 | Temp 97.4°F | Wt 225.0 lb

## 2014-10-13 DIAGNOSIS — M009 Pyogenic arthritis, unspecified: Secondary | ICD-10-CM

## 2014-10-13 NOTE — Progress Notes (Signed)
Patient ID: Brian BirkVictor Atkinson, male   DOB: [birth year], 64 y.o.   MRN: [id number]         [facility]  Patient Active Problem List   Diagnosis Date Noted  . Septic joint of left knee joint 06/18/2014    Priority: High  . Patella fracture 06/12/2014    Priority: Medium  . Unspecified constipation 06/20/2014  . Postoperative anemia due to acute blood loss 06/18/2014  . Iron deficiency anemia 06/18/2014  . Fever, unspecified 06/17/2014  . Sepsis 06/16/2014  . Hyponatremia 06/16/2014  . Uncontrolled pain 06/16/2014    Patient's Medications  New Prescriptions   No medications on file  Previous Medications   FERROUS SULFATE 325 (65 FE) MG TABLET    Take 1 tablet (325 mg total) by mouth 3 (three) times daily with meals.   HYDROCODONE-ACETAMINOPHEN (NORCO) 10-325 MG PER TABLET    Take 1-2 tablets by mouth every 6 (six) hours as needed for severe pain.   METHOCARBAMOL (ROBAXIN) 500 MG TABLET    Take 1-2 tablets (500-1,000 mg total) by mouth every 6 (six) hours as needed for muscle spasms.   OXYCODONE (OXY IR/ROXICODONE) 5 MG IMMEDIATE RELEASE TABLET    Take 1-2 tablets (5-10 mg total) by mouth every 6 (six) hours as needed for breakthrough pain (take between hydrocodone for breakthrough pain).   POLYETHYLENE GLYCOL (MIRALAX / GLYCOLAX) PACKET    Take 17 g by mouth 2 (two) times daily.   SENNA-DOCUSATE (SENOKOT-S) 8.6-50 MG PER TABLET    Take 2 tablets by mouth at bedtime.  Modified Medications   No medications on file  Discontinued Medications   No medications on file    Subjective: Brian Atkinson is in for his routine follow-up visit. He completed about 11 weeks of antibiotic therapy for his left knee MRSA infection around Thanksgiving. He continues to make slow progress and has not had any concerns about his infection flaring up. He has increased range of motion of his knee and less pain. He has been able to be more physically active and he can now walk downstairs  without severe pain. He has not had any fever or chills. He is scheduled to see Dr. Carola FrostHandy tomorrow. Review of Systems: Pertinent items are noted in HPI.  Past Medical History  Diagnosis Date  . Medical history non-contributory     History  Substance Use Topics  . Smoking status: Never Smoker   . Smokeless tobacco: Never Used  . Alcohol Use: No    No family history on file.  No Known Allergies  Objective: Temp: 97.4 F (36.3 C) (01/05 1051) Temp Source: Oral (01/05 1051) BP: 142/86 mmHg (01/05 1051) Pulse Rate: 83 (01/05 1051)  General: he is smiling and in good spirits Left knee: His incision is fully healed. He has good flexion and extension. There is no significant swelling, unusual warmth or redness.   Assessment: He is doing well off antibiotics and I am hopeful that he has MRSA infection has been cured.  Plan: 1. Continue observation off of antibiotics 2. Follow-up here in 4-6 weeks   Cliffton AstersJohn Javona Bergevin, MD [facility] [phone] pager   [phone] cell 10/13/2014, 11:00 AM

## 2014-12-08 ENCOUNTER — Ambulatory Visit: Payer: Self-pay | Admitting: Internal Medicine

## 2015-05-26 ENCOUNTER — Encounter (HOSPITAL_COMMUNITY): Payer: Self-pay | Admitting: *Deleted

## 2015-05-26 MED ORDER — CEFAZOLIN SODIUM-DEXTROSE 2-3 GM-% IV SOLR
2.0000 g | INTRAVENOUS | Status: DC
  Filled 2015-05-26: qty 50

## 2015-05-26 MED ORDER — CHLORHEXIDINE GLUCONATE 4 % EX LIQD: 60.0000 mL | Freq: Once | CUTANEOUS | Status: DC

## 2015-05-26 MED ORDER — LACTATED RINGERS IV SOLN
INTRAVENOUS | Status: DC
  Administered 2015-05-27 (×2): via INTRAVENOUS

## 2015-05-26 NOTE — Progress Notes (Signed)
Pt denies SOB, chest pain, and being under the care of a cardiologist. Pt denies having a stress test , echo and cardiac cath. Pt denies having and EKG within the last year. Pt made aware to stop taking Aspirin, otc vitamins and herbal medications. Do not take any NSAIDs ie: Ibuprofen, Advil, Naproxen or any medication containing Aspirin. Pt verbalized understanding of all pre-op instructions.
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
## 2015-05-27 ENCOUNTER — Observation Stay (HOSPITAL_COMMUNITY)
Admission: RE | Admit: 2015-05-27 | Discharge: 2015-05-28 | DRG: 857 | Disposition: A | Discharge status: Home Health | Payer: No Typology Code available for payment source | Source: Ambulatory Visit | Attending: Orthopedic Surgery | Admitting: Orthopedic Surgery

## 2015-05-27 ENCOUNTER — Ambulatory Visit (HOSPITAL_COMMUNITY): Payer: No Typology Code available for payment source | Admitting: Certified Registered Nurse Anesthetist

## 2015-05-27 ENCOUNTER — Encounter (HOSPITAL_COMMUNITY): Payer: Self-pay | Admitting: Certified Registered Nurse Anesthetist

## 2015-05-27 ENCOUNTER — Encounter (HOSPITAL_COMMUNITY)
Admission: RE | Disposition: A | Discharge status: Home Health | Payer: Self-pay | Source: Ambulatory Visit | Attending: Orthopedic Surgery

## 2015-05-27 DIAGNOSIS — Y838 Other surgical procedures as the cause of abnormal reaction of the patient, or of later complication, without mention of misadventure at the time of the procedure: Secondary | ICD-10-CM | POA: Diagnosis not present

## 2015-05-27 DIAGNOSIS — M869 Osteomyelitis, unspecified: Secondary | ICD-10-CM | POA: Diagnosis present

## 2015-05-27 DIAGNOSIS — M25162 Fistula, left knee: Secondary | ICD-10-CM | POA: Diagnosis present

## 2015-05-27 DIAGNOSIS — M71162 Other infective bursitis, left knee: Secondary | ICD-10-CM | POA: Diagnosis not present

## 2015-05-27 DIAGNOSIS — M719 Bursopathy, unspecified: Secondary | ICD-10-CM | POA: Diagnosis present

## 2015-05-27 DIAGNOSIS — T8484XA Pain due to internal orthopedic prosthetic devices, implants and grafts, initial encounter: Secondary | ICD-10-CM | POA: Diagnosis present

## 2015-05-27 DIAGNOSIS — M868X8 Other osteomyelitis, other site: Secondary | ICD-10-CM | POA: Diagnosis present

## 2015-05-27 DIAGNOSIS — M00062 Staphylococcal arthritis, left knee: Secondary | ICD-10-CM | POA: Diagnosis not present

## 2015-05-27 DIAGNOSIS — B9562 Methicillin resistant Staphylococcus aureus infection as the cause of diseases classified elsewhere: Secondary | ICD-10-CM | POA: Diagnosis not present

## 2015-05-27 DIAGNOSIS — T814XXA Infection following a procedure, initial encounter: Secondary | ICD-10-CM | POA: Diagnosis not present

## 2015-05-27 HISTORY — DX: Unspecified fracture of unspecified patella, initial encounter for closed fracture: S82.009A

## 2015-05-27 HISTORY — PX: KNEE BURSECTOMY: SHX5882

## 2015-05-27 LAB — CBC
HCT: 42.7 % (ref 39.0–52.0)
HEMATOCRIT: 39.9 % (ref 39.0–52.0)
HEMOGLOBIN: 14.8 g/dL (ref 13.0–17.0)
HEMOGLOBIN: 13.4 g/dL (ref 13.0–17.0)
MCH: 31.2 pg (ref 26.0–34.0)
MCH: 30.5 pg (ref 26.0–34.0)
MCHC: 34.7 g/dL (ref 30.0–36.0)
MCHC: 33.6 g/dL (ref 30.0–36.0)
MCV: 89.9 fL (ref 78.0–100.0)
MCV: 90.7 fL (ref 78.0–100.0)
Platelets: 235 10*3/uL (ref 150–400)
Platelets: 220 10*3/uL (ref 150–400)
RBC: 4.75 MIL/uL (ref 4.22–5.81)
RBC: 4.4 MIL/uL (ref 4.22–5.81)
RDW: 12.6 % (ref 11.5–15.5)
RDW: 12.8 % (ref 11.5–15.5)
WBC: 9.2 10*3/uL (ref 4.0–10.5)
WBC: 12.1 10*3/uL — AB (ref 4.0–10.5)

## 2015-05-27 LAB — CREATININE, SERUM: Creatinine, Ser: 1.09 mg/dL (ref 0.61–1.24)

## 2015-05-27 LAB — C-REACTIVE PROTEIN: CRP: <0.5 mg/dL (ref <1.0)

## 2015-05-27 LAB — SEDIMENTATION RATE: Sed Rate: 10 mm/hr (ref 0–16)

## 2015-05-27 SURGERY — BURSECTOMY, KNEE
Anesthesia: General | Site: Knee | Laterality: Left

## 2015-05-27 MED ORDER — VANCOMYCIN HCL IN DEXTROSE 1-5 GM/200ML-% IV SOLN
1000.0000 mg | Freq: Two times a day (BID) | INTRAVENOUS | Status: DC
  Administered 2015-05-27 – 2015-05-28 (×2): 1000 mg via INTRAVENOUS
  Filled 2015-05-27 (×3): qty 200

## 2015-05-27 MED ORDER — HYDROMORPHONE HCL 1 MG/ML IJ SOLN
INTRAMUSCULAR | Status: AC
  Filled 2015-05-27: qty 1

## 2015-05-27 MED ORDER — HYDROCODONE-ACETAMINOPHEN 7.5-325 MG PO TABS
1.0000 | ORAL_TABLET | Freq: Four times a day (QID) | ORAL | Status: DC | PRN
  Administered 2015-05-27 – 2015-05-28 (×3): 2 via ORAL
  Filled 2015-05-27 (×3): qty 2

## 2015-05-27 MED ORDER — METOCLOPRAMIDE HCL 5 MG/ML IJ SOLN: 5.0000 mg | Freq: Three times a day (TID) | INTRAMUSCULAR | Status: DC | PRN

## 2015-05-27 MED ORDER — MIDAZOLAM HCL 2 MG/2ML IJ SOLN
INTRAMUSCULAR | Status: AC
  Filled 2015-05-27: qty 4

## 2015-05-27 MED ORDER — OXYCODONE HCL 5 MG PO TABS
5.0000 mg | ORAL_TABLET | ORAL | Status: DC | PRN
  Administered 2015-05-28 (×2): 10 mg via ORAL
  Filled 2015-05-27 (×2): qty 2

## 2015-05-27 MED ORDER — KETOROLAC TROMETHAMINE 10 MG PO TABS: 10.0000 mg | ORAL_TABLET | Freq: Four times a day (QID) | ORAL | Status: DC | PRN

## 2015-05-27 MED ORDER — POTASSIUM CHLORIDE IN NACL 20-0.9 MEQ/L-% IV SOLN: INTRAVENOUS | Status: DC

## 2015-05-27 MED ORDER — PROPOFOL 10 MG/ML IV BOLUS
INTRAVENOUS | Status: DC | PRN
  Administered 2015-05-27: 180 mg via INTRAVENOUS

## 2015-05-27 MED ORDER — ONDANSETRON HCL 4 MG PO TABS: 4.0000 mg | ORAL_TABLET | Freq: Four times a day (QID) | ORAL | Status: DC | PRN

## 2015-05-27 MED ORDER — DOXYCYCLINE HYCLATE 100 MG PO CAPS: 100.0000 mg | ORAL_CAPSULE | Freq: Two times a day (BID) | ORAL | Status: DC

## 2015-05-27 MED ORDER — KETOROLAC TROMETHAMINE 15 MG/ML IJ SOLN
INTRAMUSCULAR | Status: AC
  Filled 2015-05-27: qty 1

## 2015-05-27 MED ORDER — VANCOMYCIN HCL 1000 MG IV SOLR
INTRAVENOUS | Status: AC
  Filled 2015-05-27: qty 1000

## 2015-05-27 MED ORDER — FENTANYL CITRATE (PF) 250 MCG/5ML IJ SOLN
INTRAMUSCULAR | Status: AC
Start: 2015-05-27 — End: 2015-05-27
  Filled 2015-05-27: qty 5

## 2015-05-27 MED ORDER — VANCOMYCIN HCL 10 G IV SOLR
1500.0000 mg | INTRAVENOUS | Status: AC
  Administered 2015-05-27: 1.5 g via INTRAVENOUS
  Filled 2015-05-27: qty 1500

## 2015-05-27 MED ORDER — PROPOFOL 10 MG/ML IV BOLUS
INTRAVENOUS | Status: AC
  Filled 2015-05-27: qty 20

## 2015-05-27 MED ORDER — DOCUSATE SODIUM 100 MG PO CAPS
100.0000 mg | ORAL_CAPSULE | Freq: Two times a day (BID) | ORAL | Status: DC
  Administered 2015-05-27 – 2015-05-28 (×2): 100 mg via ORAL
  Filled 2015-05-27 (×2): qty 1

## 2015-05-27 MED ORDER — VANCOMYCIN HCL 1000 MG IV SOLR
INTRAVENOUS | Status: DC | PRN
  Administered 2015-05-27: 1000 mg

## 2015-05-27 MED ORDER — BUPIVACAINE HCL (PF) 0.25 % IJ SOLN
INTRAMUSCULAR | Status: AC
  Filled 2015-05-27: qty 30

## 2015-05-27 MED ORDER — ACETAMINOPHEN 650 MG RE SUPP: 650.0000 mg | Freq: Four times a day (QID) | RECTAL | Status: DC | PRN

## 2015-05-27 MED ORDER — PHENYLEPHRINE HCL 10 MG/ML IJ SOLN
INTRAMUSCULAR | Status: DC | PRN
  Administered 2015-05-27: 40 ug via INTRAVENOUS
  Administered 2015-05-27: 80 ug via INTRAVENOUS

## 2015-05-27 MED ORDER — LIDOCAINE HCL (CARDIAC) 20 MG/ML IV SOLN
INTRAVENOUS | Status: DC | PRN
  Administered 2015-05-27: 80 mg via INTRAVENOUS

## 2015-05-27 MED ORDER — ACETAMINOPHEN 325 MG PO TABS: 650.0000 mg | ORAL_TABLET | Freq: Four times a day (QID) | ORAL | Status: DC | PRN

## 2015-05-27 MED ORDER — HYDROMORPHONE HCL 1 MG/ML IJ SOLN
INTRAMUSCULAR | Status: DC
Start: 2015-05-27 — End: 2015-05-27
  Filled 2015-05-27: qty 1

## 2015-05-27 MED ORDER — ONDANSETRON HCL 4 MG/2ML IJ SOLN: 4.0000 mg | Freq: Four times a day (QID) | INTRAMUSCULAR | Status: DC | PRN

## 2015-05-27 MED ORDER — SODIUM CHLORIDE 0.9 % IR SOLN
Status: DC | PRN
  Administered 2015-05-27 (×2): 1000 mL

## 2015-05-27 MED ORDER — PROMETHAZINE HCL 25 MG/ML IJ SOLN: 6.2500 mg | INTRAMUSCULAR | Status: DC | PRN

## 2015-05-27 MED ORDER — HYDROMORPHONE HCL 1 MG/ML IJ SOLN
0.2500 mg | INTRAMUSCULAR | Status: DC | PRN
  Administered 2015-05-27 (×4): 0.5 mg via INTRAVENOUS

## 2015-05-27 MED ORDER — METOCLOPRAMIDE HCL 5 MG PO TABS: 5.0000 mg | ORAL_TABLET | Freq: Three times a day (TID) | ORAL | Status: DC | PRN

## 2015-05-27 MED ORDER — VANCOMYCIN HCL 10 G IV SOLR
2000.0000 mg | Freq: Once | INTRAVENOUS | Status: DC
  Filled 2015-05-27: qty 2000

## 2015-05-27 MED ORDER — DEXTROSE 5 % IV SOLN
500.0000 mg | Freq: Four times a day (QID) | INTRAVENOUS | Status: DC | PRN
  Filled 2015-05-27: qty 5

## 2015-05-27 MED ORDER — KETOROLAC TROMETHAMINE 15 MG/ML IJ SOLN
15.0000 mg | Freq: Four times a day (QID) | INTRAMUSCULAR | Status: DC
  Administered 2015-05-27 – 2015-05-28 (×3): 15 mg via INTRAVENOUS
  Filled 2015-05-27 (×2): qty 1

## 2015-05-27 MED ORDER — FENTANYL CITRATE (PF) 100 MCG/2ML IJ SOLN
INTRAMUSCULAR | Status: DC | PRN
  Administered 2015-05-27: 50 ug via INTRAVENOUS
  Administered 2015-05-27: 25 ug via INTRAVENOUS
  Administered 2015-05-27 (×2): 50 ug via INTRAVENOUS
  Administered 2015-05-27 (×2): 25 ug via INTRAVENOUS
  Administered 2015-05-27: 50 ug via INTRAVENOUS
  Administered 2015-05-27: 25 ug via INTRAVENOUS

## 2015-05-27 MED ORDER — BUPIVACAINE HCL (PF) 0.25 % IJ SOLN
INTRAMUSCULAR | Status: DC | PRN
  Administered 2015-05-27: 10 mL

## 2015-05-27 MED ORDER — OXYCODONE-ACETAMINOPHEN 5-325 MG PO TABS: 1.0000 | ORAL_TABLET | Freq: Four times a day (QID) | ORAL | Status: DC | PRN

## 2015-05-27 MED ORDER — DIPHENHYDRAMINE HCL 12.5 MG/5ML PO ELIX
12.5000 mg | ORAL_SOLUTION | ORAL | Status: DC | PRN
  Administered 2015-05-28: 25 mg via ORAL
  Filled 2015-05-27: qty 10

## 2015-05-27 MED ORDER — HYDROMORPHONE HCL 1 MG/ML IJ SOLN: 1.0000 mg | INTRAMUSCULAR | Status: DC | PRN

## 2015-05-27 MED ORDER — MIDAZOLAM HCL 5 MG/5ML IJ SOLN
INTRAMUSCULAR | Status: DC | PRN
  Administered 2015-05-27: 2 mg via INTRAVENOUS

## 2015-05-27 MED ORDER — ENOXAPARIN SODIUM 40 MG/0.4ML ~~LOC~~ SOLN
40.0000 mg | SUBCUTANEOUS | Status: DC
  Administered 2015-05-28: 40 mg via SUBCUTANEOUS
  Filled 2015-05-27: qty 0.4

## 2015-05-27 MED ORDER — ONDANSETRON HCL 4 MG/2ML IJ SOLN
INTRAMUSCULAR | Status: DC | PRN
  Administered 2015-05-27: 4 mg via INTRAVENOUS

## 2015-05-27 MED ORDER — FENTANYL CITRATE (PF) 250 MCG/5ML IJ SOLN
INTRAMUSCULAR | Status: AC
  Filled 2015-05-27: qty 5

## 2015-05-27 MED ORDER — METHOCARBAMOL 500 MG PO TABS
500.0000 mg | ORAL_TABLET | Freq: Four times a day (QID) | ORAL | Status: DC | PRN
  Administered 2015-05-27 – 2015-05-28 (×2): 500 mg via ORAL
  Filled 2015-05-27 (×2): qty 1

## 2015-05-27 MED ORDER — ONDANSETRON HCL 4 MG PO TABS: 4.0000 mg | ORAL_TABLET | Freq: Three times a day (TID) | ORAL | Status: DC | PRN

## 2015-05-27 SURGICAL SUPPLY — 53 items
BANDAGE ELASTIC 4 VELCRO ST LF (GAUZE/BANDAGES/DRESSINGS) ×1 IMPLANT
BANDAGE ELASTIC 6 VELCRO ST LF (GAUZE/BANDAGES/DRESSINGS) ×1 IMPLANT
BLADE SURG 10 STRL SS (BLADE) ×3 IMPLANT
BNDG COHESIVE 4X5 TAN STRL (GAUZE/BANDAGES/DRESSINGS) ×2 IMPLANT
BNDG GAUZE ELAST 4 BULKY (GAUZE/BANDAGES/DRESSINGS) ×4 IMPLANT
BNDG GAUZE STRTCH 6 (GAUZE/BANDAGES/DRESSINGS) ×3 IMPLANT
BRUSH SCRUB DISP (MISCELLANEOUS) ×5 IMPLANT
CANISTER SUCTION 2500CC (MISCELLANEOUS) ×1 IMPLANT
COVER SURGICAL LIGHT HANDLE (MISCELLANEOUS) ×4 IMPLANT
DRAPE U-SHAPE 47X51 STRL (DRAPES) ×2 IMPLANT
DRSG ADAPTIC 3X8 NADH LF (GAUZE/BANDAGES/DRESSINGS) ×2 IMPLANT
DRSG PAD ABDOMINAL 8X10 ST (GAUZE/BANDAGES/DRESSINGS) ×4 IMPLANT
ELECT CAUTERY BLADE 6.4 (BLADE) IMPLANT
ELECT REM PT RETURN 9FT ADLT (ELECTROSURGICAL) ×2
ELECTRODE REM PT RTRN 9FT ADLT (ELECTROSURGICAL) IMPLANT
GAUZE SPONGE 4X4 12PLY STRL (GAUZE/BANDAGES/DRESSINGS) ×2 IMPLANT
GLOVE BIO SURGEON STRL SZ7.5 (GLOVE) ×2 IMPLANT
GLOVE BIO SURGEON STRL SZ8 (GLOVE) ×2 IMPLANT
GLOVE BIOGEL PI IND STRL 7.5 (GLOVE) ×1 IMPLANT
GLOVE BIOGEL PI IND STRL 8 (GLOVE) ×1 IMPLANT
GLOVE BIOGEL PI INDICATOR 7.5 (GLOVE) ×1
GLOVE BIOGEL PI INDICATOR 8 (GLOVE) ×1
GOWN STRL REUS W/ TWL LRG LVL3 (GOWN DISPOSABLE) ×2 IMPLANT
GOWN STRL REUS W/ TWL XL LVL3 (GOWN DISPOSABLE) ×1 IMPLANT
GOWN STRL REUS W/TWL LRG LVL3 (GOWN DISPOSABLE) ×4
GOWN STRL REUS W/TWL XL LVL3 (GOWN DISPOSABLE) ×2
HANDPIECE INTERPULSE COAX TIP (DISPOSABLE)
KIT BASIN OR (CUSTOM PROCEDURE TRAY) ×2 IMPLANT
KIT ROOM TURNOVER OR (KITS) ×2 IMPLANT
KIT STIMULAN RAPID CURE 5CC (Orthopedic Implant) ×1 IMPLANT
MANIFOLD NEPTUNE II (INSTRUMENTS) IMPLANT
NEEDLE 22X1 1/2 (OR ONLY) (NEEDLE) ×1 IMPLANT
NS IRRIG 1000ML POUR BTL (IV SOLUTION) ×4 IMPLANT
PACK ORTHO EXTREMITY (CUSTOM PROCEDURE TRAY) ×2 IMPLANT
PAD ARMBOARD 7.5X6 YLW CONV (MISCELLANEOUS) ×4 IMPLANT
PADDING CAST COTTON 6X4 STRL (CAST SUPPLIES) ×1 IMPLANT
PASSER SUT SWANSON 36MM LOOP (INSTRUMENTS) ×1 IMPLANT
SET HNDPC FAN SPRY TIP SCT (DISPOSABLE) IMPLANT
SPONGE LAP 18X18 X RAY DECT (DISPOSABLE) ×2 IMPLANT
STOCKINETTE IMPERVIOUS 9X36 MD (GAUZE/BANDAGES/DRESSINGS) ×2 IMPLANT
SUCTION FRAZIER TIP 10 FR DISP (SUCTIONS) ×2 IMPLANT
SUT ETHILON 2 0 PSLX (SUTURE) ×2 IMPLANT
SUT PDS AB 1 CT  36 (SUTURE) ×1
SUT PDS AB 1 CT 36 (SUTURE) IMPLANT
SUT PDS AB 2-0 CT1 27 (SUTURE) ×6 IMPLANT
SYR 30ML LL (SYRINGE) ×1 IMPLANT
TOWEL OR 17X24 6PK STRL BLUE (TOWEL DISPOSABLE) ×2 IMPLANT
TOWEL OR 17X26 10 PK STRL BLUE (TOWEL DISPOSABLE) ×4 IMPLANT
TUBE ANAEROBIC SPECIMEN COL (MISCELLANEOUS) ×1 IMPLANT
TUBE CONNECTING 12X1/4 (SUCTIONS) ×2 IMPLANT
UNDERPAD 30X30 INCONTINENT (UNDERPADS AND DIAPERS) ×2 IMPLANT
WATER STERILE IRR 1000ML POUR (IV SOLUTION) ×1 IMPLANT
YANKAUER SUCT BULB TIP NO VENT (SUCTIONS) ×2 IMPLANT

## 2015-05-27 NOTE — Progress Notes (Signed)
ANTIBIOTIC CONSULT NOTE - INITIAL  Pharmacy Consult for vanc Indication: OM, MRSA of L knee history  No Known Allergies  Patient Measurements: Height: 5' 11.5" (181.6 cm) Weight: 210 lb (95.255 kg) IBW/kg (Calculated) : 76.45  Vital Signs: Temp: 97.6 F (36.4 C) (08/18 1749) Temp Source: Oral (08/18 0833) BP: 148/82 mmHg (08/18 1749) Pulse Rate: 67 (08/18 1749) Intake/Output from previous day:   Intake/Output from this shift: Total I/O In: 1450 [I.V.:1450] Out: 50 [Blood:50]  Labs:  Recent Labs  05/27/15 0942  WBC 9.2  HGB 14.8  PLT 235   CrCl cannot be calculated (Patient has no serum creatinine result on file.). No results for input(s): VANCOTROUGH, VANCOPEAK, VANCORANDOM, GENTTROUGH, GENTPEAK, GENTRANDOM, TOBRATROUGH, TOBRAPEAK, TOBRARND, AMIKACINPEAK, AMIKACINTROU, AMIKACIN in the last 72 hours.   Microbiology: No results found for this or any previous visit (from the past 720 hour(s)).  Medical History: Past Medical History  Diagnosis Date  . h/o septic knee     Left  . Patella fracture     left    Assessment: 32 yom presenting with previous patellar fracture complicated by infection of prepatellar sutures w/o removal. Patient was treated with 11wks of vanc/doxy previously. Now probable return of OM with MRSA with recurrent bursitis, infected suture s/p partial excision of L patella, removal of infected implant, fiberwire, and placement of antibiotic beads on 05/27/15. Pharmacy consulted to dose vancomycin. Afebrile, wbc wnl. Patient received  of vanc x 1 dose intra-op today . SCr 1.09 on admit, CrCl~82  8/18 vanc>>  8/18 AFB cx>> 8/18 wound cx>> 8/18 anaerobic cx>>   Goal of Therapy:  Vancomycin trough level 15-20 mcg/ml  Plan:  Vanc 1g IV q12h Mon clinical progress, c/s, renal function, abx plan VT@SS   Babs Bertin, PharmD Clinical Pharmacist Pager [phone] 05/27/2015 6:19 PM

## 2015-05-27 NOTE — Brief Op Note (Signed)
05/27/2015  2:24 PM  PATIENT:  Brian Atkinson  64 y.o. male  PRE-OPERATIVE DIAGNOSIS:  Recurrent bursitis, MRSA infected suture  POST-OPERATIVE DIAGNOSIS:   1. Patella osteomyelitis with sinus tract 2. Infected, nonresorbable suture, recurrent bursitis  PROCEDURE:  Procedure(s): 1. Partial excision left patella 2. Removal infected implant, fiberwire w/in patellar tendon (Left) 3. Placement of antibiotic beads  SURGEON:  Surgeon(s) and Role:    * Myrene Galas, MD - Primary  PHYSICIAN ASSISTANT: Montez Morita, PA-C  ANESTHESIA:   general  I/O:     SPECIMEN:  Source of Specimen:  Anearobic and aerobic  DISPOSITION OF SPECIMEN:  To micro  TOURNIQUET:  * No tourniquets in log *  DICTATION: .Other Dictation: Dictation Number [id number]

## 2015-05-27 NOTE — Anesthesia Postprocedure Evaluation (Signed)
  Anesthesia Post-op Note  Patient: Brian Atkinson  Procedure(s) Performed: Procedure(s): KNEE BURSECTOMY, suture removal patellar tendon (Left)  Patient Location: PACU  Anesthesia Type:General  Level of Consciousness: awake and alert   Airway and Oxygen Therapy: Patient Spontanous Breathing and Patient connected to nasal cannula oxygen  Post-op Pain: mild, moderate  Post-op Assessment: Post-op Vital signs reviewed and Patient's Cardiovascular Status Stable              Post-op Vital Signs: Reviewed and stable  Last Vitals:  Filed Vitals:   05/27/15 1645  BP:   Pulse: 69  Temp:   Resp: 9    Complications: No apparent anesthesia complications

## 2015-05-27 NOTE — Anesthesia Procedure Notes (Signed)
Procedure Name: LMA Insertion Date/Time: 05/27/2015 12:10 PM Performed by: Marylyn Ishihara Pre-anesthesia Checklist: Patient identified, Emergency Drugs available, Suction available, Patient being monitored and Timeout performed Patient Re-evaluated:Patient Re-evaluated prior to inductionOxygen Delivery Method: Circle system utilized Preoxygenation: Pre-oxygenation with 100% oxygen Intubation Type: IV induction Ventilation: Mask ventilation without difficulty LMA: LMA inserted LMA Size: 4.0 Number of attempts: 1 Placement Confirmation: positive ETCO2 and breath sounds checked- equal and bilateral Tube secured with: Tape Dental Injury: Teeth and Oropharynx as per pre-operative assessment

## 2015-05-27 NOTE — Transfer of Care (Signed)
Immediate Anesthesia Transfer of Care Note  Patient: Brian Atkinson  Procedure(s) Performed: Procedure(s): KNEE BURSECTOMY, suture removal patellar tendon (Left)  Patient Location: PACU  Anesthesia Type:General  Level of Consciousness: awake, alert , oriented, patient cooperative and responds to stimulation  Airway & Oxygen Therapy: Patient Spontanous Breathing and Patient connected to face mask oxygen  Post-op Assessment: Report given to RN, Post -op Vital signs reviewed and stable, Patient moving all extremities X 4 and Patient able to stick tongue midline  Post vital signs: stable  Last Vitals:  Filed Vitals:   05/27/15 0833  BP: 163/99  Pulse: 79  Temp: 36.6 C  Resp: 20    Complications: No apparent anesthesia complications

## 2015-05-27 NOTE — Discharge Instructions (Addendum)
[facility] Discharge Instructions   General Discharge Instructions  WEIGHT BEARING STATUS: Weightbearing as tolerated  RANGE OF MOTION/ACTIVITY: as tolerated   PAIN MEDICATION USE AND EXPECTATIONS  You have likely been given narcotic medications to help control your pain.  After a traumatic event that results in an fracture (broken bone) with or without surgery, it is ok to use narcotic pain medications to help control one's pain.  We understand that everyone responds to pain differently and each individual patient will be evaluated on a regular basis for the continued need for narcotic medications. Ideally, narcotic medication use should last no more than 6-8 weeks (coinciding with fracture healing).   As a patient it is your responsibility as well to monitor narcotic medication use and report the amount and frequency you use these medications when you come to your office visit.   We would also advise that if you are using narcotic medications, you should take a dose prior to therapy to maximize you participation.  IF YOU ARE ON NARCOTIC MEDICATIONS IT IS NOT PERMISSIBLE TO OPERATE A MOTOR VEHICLE (MOTORCYCLE/CAR/TRUCK/MOPED) OR HEAVY MACHINERY DO NOT MIX NARCOTICS WITH OTHER CNS (CENTRAL NERVOUS SYSTEM) DEPRESSANTS SUCH AS ALCOHOL  Diet: as you were eating previously.  Can use over the counter stool softeners and bowel preparations, such as Miralax, to help with bowel movements.  Narcotics can be constipating.  Be sure to drink plenty of fluids  Wound Care: daily wound care starting on 05/30/2015. See instructions below   STOP SMOKING OR USING NICOTINE PRODUCTS!!!!  As discussed nicotine severely impairs your body's ability to heal surgical and traumatic wounds but also impairs bone healing.  Wounds and bone heal by forming microscopic blood vessels (angiogenesis) and nicotine is a vasoconstrictor (essentially, shrinks blood vessels).  Therefore, if vasoconstriction occurs to  these microscopic blood vessels they essentially disappear and are unable to deliver necessary nutrients to the healing tissue.  This is one modifiable factor that you can do to dramatically increase your chances of healing your injury.    (This means no smoking, no nicotine gum, patches, etc)  DO NOT USE NONSTEROIDAL ANTI-INFLAMMATORY DRUGS (NSAID'S)  Using products such as Advil (ibuprofen), Aleve (naproxen), Motrin (ibuprofen) for additional pain control during fracture healing can delay and/or prevent the healing response.  If you would like to take over the counter (OTC) medication, Tylenol (acetaminophen) is ok.  However, some narcotic medications that are given for pain control contain acetaminophen as well. Therefore, you should not exceed more than 4000 mg of tylenol in a day if you do not have liver disease.  Also note that there are may OTC medicines, such as cold medicines and allergy medicines that my contain tylenol as well.  If you have any questions about medications and/or interactions please ask your doctor/PA or your pharmacist.      ICE AND ELEVATE INJURED/OPERATIVE EXTREMITY  Using ice and elevating the injured extremity above your heart can help with swelling and pain control.  Icing in a pulsatile fashion, such as 20 minutes on and 20 minutes off, can be followed.    Do not place ice directly on skin. Make sure there is a barrier between to skin and the ice pack.    Using frozen items such as frozen peas works well as the conform nicely to the are that needs to be iced.  USE AN ACE WRAP OR TED HOSE FOR SWELLING CONTROL  In addition to icing and elevation, Ace wraps or TED  hose are used to help limit and resolve swelling.  It is recommended to use Ace wraps or TED hose until you are informed to stop.    When using Ace Wraps start the wrapping distally (farthest away from the body) and wrap proximally (closer to the body)   Example: If you had surgery on your leg or thing and you  do not have a splint on, start the ace wrap at the toes and work your way up to the thigh        If you had surgery on your upper extremity and do not have a splint on, start the ace wrap at your fingers and work your way up to the upper arm  IF YOU ARE IN A SPLINT OR CAST DO NOT [place]   If your splint gets wet for any reason please contact the office immediately. You may shower in your splint or cast as long as you keep it dry.  This can be done by wrapping in a cast cover or garbage back (or similar)  Do Not stick any thing down your splint or cast such as pencils, money, or hangers to try and scratch yourself with.  If you feel itchy take benadryl as prescribed on the bottle for itching  IF YOU ARE IN A CAM BOOT (BLACK BOOT)  You may remove boot periodically. Perform daily dressing changes as noted below.  Wash the liner of the boot regularly and wear a sock when wearing the boot. It is recommended that you sleep in the boot until told otherwise  CALL THE OFFICE WITH ANY QUESTIONS OR CONCERTS: [phone]     Discharge Pin Site Instructions  Dress pins daily with Kerlix roll starting on POD 2. Wrap the Kerlix so that it tamps the skin down around the pin-skin interface to prevent/limit motion of the skin relative to the pin.  (Pin-skin motion is the primary cause of pain and infection related to external fixator pin sites).  Remove any crust or coagulum that may obstruct drainage with a saline moistened gauze or soap and water.  After POD 3, if there is no discernable drainage on the pin site dressing, the interval for change can by increased to every other day.  You may shower with the fixator, cleaning all pin sites gently with soap and water.  If you have a surgical wound this needs to be completely dry and without drainage before showering.  The extremity can be lifted by the fixator to facilitate wound care and transfers.  Notify the office/Doctor if you  experience increasing drainage, redness, or pain from a pin site, or if you notice purulent (thick, snot-like) drainage.  Discharge Wound Care Instructions  Do NOT apply any ointments, solutions or lotions to pin sites or surgical wounds.  These prevent needed drainage and even though solutions like hydrogen peroxide kill bacteria, they also damage cells lining the pin sites that help fight infection.  Applying lotions or ointments can keep the wounds moist and can cause them to breakdown and open up as well. This can increase the risk for infection. When in doubt call the office.  Surgical incisions should be dressed daily.  If any drainage is noted, use one layer of adaptic, then gauze, Kerlix, and an ace wrap.  Once the incision is completely dry and without drainage, it may be left open to air out.  Showering may begin 36-48 hours later.  Cleaning gently with soap and water.  Traumatic wounds should be dressed daily as well.    One layer of adaptic, gauze, Kerlix, then ace wrap.  The adaptic can be discontinued once the draining has ceased    If you have a wet to dry dressing: wet the gauze with saline the squeeze as much saline out so the gauze is moist (not soaking wet), place moistened gauze over wound, then place a dry gauze over the moist one, followed by Kerlix wrap, then ace wrap.  What to eat:  For your first meals, you should eat lightly; only small meals initially.  If you do not have nausea, you may eat larger meals.  Avoid spicy, greasy and heavy food.    General Anesthesia, Adult, Care After  Refer to this sheet in the next few weeks. These instructions provide you with information on caring for yourself after your procedure. Your health care provider may also give you more specific instructions. Your treatment has been planned according to current medical practices, but problems sometimes occur. Call your health care provider if you have any problems or questions after your  procedure.  WHAT TO EXPECT AFTER THE PROCEDURE  After the procedure, it is typical to experience:  Sleepiness.  Nausea and vomiting. HOME CARE INSTRUCTIONS  For the first 24 hours after general anesthesia:  Have a responsible person with you.  Do not drive a car. If you are alone, do not take public transportation.  Do not drink alcohol.  Do not take medicine that has not been prescribed by your health care provider.  Do not sign important papers or make important decisions.  You may resume a normal diet and activities as directed by your health care provider.  Change bandages (dressings) as directed.  If you have questions or problems that seem related to general anesthesia, call the hospital and ask for the anesthetist or anesthesiologist on call. SEEK MEDICAL CARE IF:  You have nausea and vomiting that continue the day after anesthesia.  You develop a rash. SEEK IMMEDIATE MEDICAL CARE IF:  You have difficulty breathing.  You have chest pain.  You have any allergic problems. Document Released: 01/01/2001 Document Revised: 05/28/2013 Document Reviewed: 04/10/2013  [facility] Patient Information 2014 [place], [place].   Sore Throat    A sore throat is a painful, burning, sore, or scratchy feeling of the throat. There may be pain or tenderness when swallowing or talking. You may have other symptoms with a sore throat. These include coughing, sneezing, fever, or a swollen neck. A sore throat is often the first sign of another sickness. These sicknesses may include a cold, flu, strep throat, or an infection called mono. Most sore throats go away without medical treatment.  HOME CARE  Only take medicine as told by your doctor.  Drink enough fluids to keep your pee (urine) clear or pale yellow.  Rest as needed.  Try using throat sprays, lozenges, or suck on hard candy (if older than 4 years or as told).  Sip warm liquids, such as broth, herbal tea, or warm water with honey. Try sucking on  frozen ice pops or drinking cold liquids.  Rinse the mouth (gargle) with salt water. Mix 1 teaspoon salt with 8 ounces of water.  Do not smoke. Avoid being around others when they are smoking.  Put a humidifier in your bedroom at night to moisten the air. You can also turn on a hot shower and sit in the bathroom for 5-10 minutes. Be sure the bathroom door is  closed. GET HELP RIGHT AWAY IF:  You have trouble breathing.  You cannot swallow fluids, soft foods, or your spit (saliva).  You have more puffiness (swelling) in the throat.  Your sore throat does not get better in 7 days.  You feel sick to your stomach (nauseous) and throw up (vomit).  You have a fever or lasting symptoms for more than 2-3 days.  You have a fever and your symptoms suddenly get worse. MAKE SURE YOU:  Understand these instructions.  Will watch your condition.  Will get help right away if you are not doing well or get worse. Document Released: 07/04/2008 Document Revised: 06/19/2012 Document Reviewed: 06/02/2012  [facility] Patient Information 2015 [place], [place]. This information is not intended to replace advice given to you by your health care provider. Make sure you discuss any questions you have with your health care provider.    PICC Home Guide A peripherally inserted central catheter (PICC) is a long, thin, flexible tube that is inserted into a vein in the upper arm. It is a form of intravenous (IV) access. It is considered to be a "central" line because the tip of the PICC ends in a large vein in your chest. This large vein is called the superior vena cava (SVC). The PICC tip ends in the SVC because there is a lot of blood flow in the SVC. This allows medicines and IV fluids to be quickly distributed throughout the body. The PICC is inserted using a sterile technique by a specially trained nurse or physician. After the PICC is inserted, a chest X-ray exam is done to be sure it is in the correct place.  A PICC may be  placed for different reasons, such as: To give medicines and liquid nutrition that can only be given through a central line. Examples are: Certain antibiotic treatments. Chemotherapy. Total parenteral nutrition (TPN). To take frequent blood samples. To give IV fluids and blood products. If there is difficulty placing a peripheral intravenous (PIV) catheter. If taken care of properly, a PICC can remain in place for several months. A PICC can also allow a person to go home from the hospital early. Medicine and PICC care can be managed at home by a family member or home health care team. WHAT PROBLEMS CAN HAPPEN WHEN I HAVE A PICC? Problems with a PICC can occasionally occur. These may include the following: A blood clot (thrombus) forming in or at the tip of the PICC. This can cause the PICC to become clogged. A clot-dissolving medicine called tissue plasminogen activator (tPA) can be given through the PICC to help break up the clot. Inflammation of the vein (phlebitis) in which the PICC is placed. Signs of inflammation may include redness, pain at the insertion site, red streaks, or being able to feel a "cord" in the vein where the PICC is located. Infection in the PICC or at the insertion site. Signs of infection may include fever, chills, redness, swelling, or pus drainage from the PICC insertion site. PICC movement (malposition). The PICC tip may move from its original position due to excessive physical activity, forceful coughing, sneezing, or vomiting. A break or cut in the PICC. It is important to not use scissors near the PICC. Nerve or tendon irritation or injury during PICC insertion. WHAT SHOULD I KEEP IN MIND ABOUT ACTIVITIES WHEN I HAVE A PICC? You may bend your arm and move it freely. If your PICC is near or at the bend of your elbow, avoid activity  with repeated motion at the elbow. Rest at home for the remainder of the day following PICC line insertion. Avoid lifting heavy objects  as instructed by your health care provider. Avoid using a crutch with the arm on the same side as your PICC. You may need to use a walker. WHAT SHOULD I KNOW ABOUT MY PICC DRESSING? Keep your PICC bandage (dressing) clean and dry to prevent infection. Ask your health care provider when you may shower. Ask your health care provider to teach you how to wrap the PICC when you do take a shower. Change the PICC dressing as instructed by your health care provider. Change your PICC dressing if it becomes loose or wet. WHAT SHOULD I KNOW ABOUT PICC CARE? Check the PICC insertion site daily for leakage, redness, swelling, or pain. Do not take a bath, swim, or use hot tubs when you have a PICC. Cover PICC line with clear plastic wrap and tape to keep it dry while showering. Flush the PICC as directed by your health care provider. Let your health care provider know right away if the PICC is difficult to flush or does not flush. Do not use force to flush the PICC. Do not use a syringe that is less than 10 mL to flush the PICC. Never pull or tug on the PICC. Avoid blood pressure checks on the arm with the PICC. Keep your PICC identification card with you at all times. Do not take the PICC out yourself. Only a trained clinical professional should remove the PICC. SEEK IMMEDIATE MEDICAL CARE IF: Your PICC is accidentally pulled all the way out. If this happens, cover the insertion site with a bandage or gauze dressing. Do not throw the PICC away. Your health care provider will need to inspect it. Your PICC was tugged or pulled and has partially come out. Do not  push the PICC back in. There is any type of drainage, redness, or swelling where the PICC enters the skin. You cannot flush the PICC, it is difficult to flush, or the PICC leaks around the insertion site when it is flushed. You hear a "flushing" sound when the PICC is flushed. You have pain, discomfort, or numbness in your arm, shoulder, or jaw on the  same side as the PICC. You feel your heart "racing" or skipping beats. You notice a hole or tear in the PICC. You develop chills or a fever. MAKE SURE YOU:  Understand these instructions. Will watch your condition. Will get help right away if you are not doing well or get worse. Document Released: 04/01/2003 Document Revised: 02/09/2014 Document Reviewed: 06/02/2013 [facility] Patient Information 2015 [place], [place]. This information is not intended to replace advice given to you by your health care provider. Make sure you discuss any questions you have with your health care provider.

## 2015-05-27 NOTE — Anesthesia Preprocedure Evaluation (Addendum)
Anesthesia Evaluation  Patient identified by MRN, date of birth, ID band Patient awake    Reviewed: Allergy & Precautions, NPO status , Patient's Chart, lab work & pertinent test results  Airway Mallampati: II  TM Distance: >3 FB Neck ROM: Full    Dental   Pulmonary neg pulmonary ROS,  breath sounds clear to auscultation        Cardiovascular negative cardio ROS  Rhythm:Regular Rate:Normal     Neuro/Psych    GI/Hepatic negative GI ROS, Neg liver ROS,   Endo/Other  negative endocrine ROS  Renal/GU negative Renal ROS     Musculoskeletal  (+) Arthritis -,   Abdominal   Peds  Hematology   Anesthesia Other Findings   Reproductive/Obstetrics                            Anesthesia Physical Anesthesia Plan  ASA: II  Anesthesia Plan: General   Post-op Pain Management:    Induction: Intravenous  Airway Management Planned: LMA  Additional Equipment:   Intra-op Plan:   Post-operative Plan:   Informed Consent: I have reviewed the patients History and Physical, chart, labs and discussed the procedure including the risks, benefits and alternatives for the proposed anesthesia with the patient or authorized representative who has indicated his/her understanding and acceptance.   Dental advisory given  Plan Discussed with: CRNA and Anesthesiologist  Anesthesia Plan Comments:         Anesthesia Quick Evaluation

## 2015-05-27 NOTE — Consult Note (Signed)
[facility]    Date of Admission:  05/27/2015           Day 1 vancomycin       Reason for Consult: MRSA Osteomyelitis and bursitis    Referring Physician: Dr. Carola Frost   Active Problems:   Osteomyelitis   .  ceFAZolin (ANCEF) IV  2 g Intravenous To SS-Surg  . chlorhexidine  60 mL Topical Once  . HYDROmorphone      . HYDROmorphone      . ketorolac  15 mg Intravenous 4 times per day    Recommendations: 1. Continue IV Vancomycin 2. Follow up cultures and target antibiotics to identified species  3. PICC placement  Assessment: Mr. Mizzell presents w/ previous patellar fracture surgery complicated by infection of prepatellar sutures w/o removal. Patient was treated w/ 11 weeks of vancomycin and doxyclcine to hopefulyl cure his infection. Patient developed possible sinus tract w/ drainage indicating likely return of infection given his sutures were still in the joint space. Patient probably has osteomyelitis w/ MRSA again and thus empiric therapy with vancomycin is warranted until we developed cultures. Vancomycin was started after operative cultures were obtained so hopefully these can guide therapy. He likely needs 4-6 weeks IV antibiotic therapy. Further management will be updated after more information has resulted from his cultures and he improves clinically.    HPI: Brian Atkinson is a 64 y.o. male with previous septic left knee and required ORIF distal patellar pole who had numerous infections due to nonresorbable sutures from his patellar repair. He had m8 weeks of vancomycin and 3 weeks of doxycycline to suppress his finection and hopefully let the sutures repair his knee. He had moderate return of function of his left knee without significant problems for most of the last year. He describes some limitation with exertional acitivity especially crawling which would cause his knee to swell but denied other symptoms. Presently, he describes a 'black dot'  which appear over his knee about a month ago. He describes it scabbing and healing over multiple times without induration or redness. He describes some bleeding and draining over the course of the month without getting any better. He reports using neosporin and other antibiotic creams to no avail. He states he had no systemic symptoms; denied respiratory symptoms, urinary symptoms, denied fever, chills, fatigue, sweats, joint swelling more than usual, induration, redness, or any other new symptoms lately outside of his draining spot on his knee. Patient informed Dr. Carola Frost of this 2 weeks ago and they planned surgery for today. Patient recuperating from surgery when interviewed.    Review of Systems: A comprehensive review of systems was negative except for: Musculoskeletal: positive for L knee swelling Behavioral/Psych: positive for depressed mood w/ limited physical acitivity, some memory problems  Past Medical History  Diagnosis Date  . h/o septic knee     Left  . Patella fracture     left     Social History  Substance Use Topics  . Smoking status: Never Smoker   . Smokeless tobacco: Never Used  . Alcohol Use: Yes     Comment: social    History reviewed. No pertinent family history. No Known Allergies  OBJECTIVE: Blood pressure 156/83, pulse 72, temperature 97.7 F (36.5 C), temperature source Oral, resp. rate 8, height 5' 11.5" (1.816 m), weight 95.255 kg (210 lb), SpO2 100 %. General: NAD, lying in recovery bed Skin: No evidence of rash, L knee  wrapped up w/ bandages and ice pack Lungs: CTAB Cor: RRR no M or G Abdomen: +BS, nontender, nondistended  Lab Results Lab Results  Component Value Date   WBC 9.2 05/27/2015   HGB 14.8 05/27/2015   HCT 42.7 05/27/2015   MCV 89.9 05/27/2015   PLT 235 05/27/2015    Lab Results  Component Value Date   CREATININE 0.71 06/22/2014   BUN 15 06/21/2014   NA 137 06/21/2014   K 3.8 06/21/2014   CL 101 06/21/2014   CO2 26 06/21/2014      Lab Results  Component Value Date   ALT 10 06/17/2014   AST 13 06/17/2014   ALKPHOS 74 06/17/2014   BILITOT 1.0 06/17/2014     Microbiology: No results found for this or any previous visit (from the past 240 hour(s)).  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 I will have a PICC placed and resume IV vancomycin pending operative culture results.  Cliffton Asters, MD [facility] [phone] pager   [phone] cell 05/27/2015, 6:20 PM

## 2015-05-28 ENCOUNTER — Encounter (HOSPITAL_COMMUNITY): Payer: Self-pay | Admitting: Orthopedic Surgery

## 2015-05-28 DIAGNOSIS — T814XXA Infection following a procedure, initial encounter: Secondary | ICD-10-CM | POA: Diagnosis not present

## 2015-05-28 DIAGNOSIS — M00062 Staphylococcal arthritis, left knee: Secondary | ICD-10-CM | POA: Diagnosis not present

## 2015-05-28 DIAGNOSIS — B9562 Methicillin resistant Staphylococcus aureus infection as the cause of diseases classified elsewhere: Secondary | ICD-10-CM | POA: Diagnosis not present

## 2015-05-28 LAB — COMPREHENSIVE METABOLIC PANEL
ALBUMIN: 2.8 g/dL — AB (ref 3.5–5.0)
ALT: 18 U/L (ref 17–63)
AST: 24 U/L (ref 15–41)
Alkaline Phosphatase: 55 U/L (ref 38–126)
Anion gap: 6 (ref 5–15)
BUN: 13 mg/dL (ref 6–20)
CHLORIDE: 104 mmol/L (ref 101–111)
CO2: 26 mmol/L (ref 22–32)
CREATININE: 1.15 mg/dL (ref 0.61–1.24)
Calcium: 8.1 mg/dL — ABNORMAL LOW (ref 8.9–10.3)
GFR calc Af Amer: >60 mL/min (ref >60)
GLUCOSE: 113 mg/dL — AB (ref 65–99)
POTASSIUM: 3.9 mmol/L (ref 3.5–5.1)
SODIUM: 136 mmol/L (ref 135–145)
Total Bilirubin: 1.2 mg/dL (ref 0.3–1.2)
Total Protein: 5.5 g/dL — ABNORMAL LOW (ref 6.5–8.1)

## 2015-05-28 LAB — CBC
HEMATOCRIT: 37.4 % — AB (ref 39.0–52.0)
Hemoglobin: 12.4 g/dL — ABNORMAL LOW (ref 13.0–17.0)
MCH: 30.4 pg (ref 26.0–34.0)
MCHC: 33.2 g/dL (ref 30.0–36.0)
MCV: 91.7 fL (ref 78.0–100.0)
PLATELETS: 197 10*3/uL (ref 150–400)
RBC: 4.08 MIL/uL — ABNORMAL LOW (ref 4.22–5.81)
RDW: 12.9 % (ref 11.5–15.5)
WBC: 9.4 10*3/uL (ref 4.0–10.5)

## 2015-05-28 MED ORDER — OXYCODONE HCL 5 MG PO TABS: 5.0000 mg | ORAL_TABLET | ORAL | Status: DC | PRN

## 2015-05-28 MED ORDER — SODIUM CHLORIDE 0.9 % IJ SOLN: 10.0000 mL | INTRAMUSCULAR | Status: DC | PRN

## 2015-05-28 MED ORDER — VANCOMYCIN HCL IN DEXTROSE 1-5 GM/200ML-% IV SOLN: 1000.0000 mg | Freq: Two times a day (BID) | INTRAVENOUS | Status: DC

## 2015-05-28 MED ORDER — OXYCODONE-ACETAMINOPHEN 5-325 MG PO TABS: 1.0000 | ORAL_TABLET | Freq: Four times a day (QID) | ORAL | Status: DC | PRN

## 2015-05-28 MED ORDER — METHOCARBAMOL 500 MG PO TABS: 500.0000 mg | ORAL_TABLET | Freq: Four times a day (QID) | ORAL | Status: DC | PRN

## 2015-05-28 NOTE — Progress Notes (Signed)
[facility]    Date of Admission:  05/27/2015           Day 2 vancomycin  Principal Problem:   Osteomyelitis   . docusate sodium  100 mg Oral BID  . enoxaparin (LOVENOX) injection  40 mg Subcutaneous Q24H  . ketorolac  15 mg Intravenous 4 times per day  . vancomycin  1,000 mg Intravenous Q12H    Subjective: Patient tells me he feels good, better than previous surgeries w/ some controllable pain only localized to his left knee. Denies fevers or other systemic symptoms. Describes being able to walk down the hallway w/ assistance. Hopes to leave the hospital soon.    Past Medical History  Diagnosis Date  . Patella fracture     left     Social History  Substance Use Topics  . Smoking status: Never Smoker   . Smokeless tobacco: Never Used  . Alcohol Use: Yes     Comment: 05/27/2015 "might have a few drinks a few times/year"    History reviewed. No pertinent family history. No Known Allergies  OBJECTIVE: Blood pressure 144/82, pulse 67, temperature 97.9 F (36.6 C), temperature source Oral, resp. rate 16, height 5' 11.5" (1.816 m), weight 95.255 kg (210 lb), SpO2 97 %. General: NAD, dressed in regular clothes sitting up in reclining chair Skin: No abnormalities appreciated Lungs: CTAB no crackles or wheezes Cor: RRR no M or G Abdomen: +BS, nondistended, nontender Leg wrapped up in bandages, no LE edema, no tenderness below left knee  Lab Results Lab Results  Component Value Date   WBC 9.4 05/28/2015   HGB 12.4* 05/28/2015   HCT 37.4* 05/28/2015   MCV 91.7 05/28/2015   PLT 197 05/28/2015    Lab Results  Component Value Date   CREATININE 1.15 05/28/2015   BUN 13 05/28/2015   NA 136 05/28/2015   K 3.9 05/28/2015   CL 104 05/28/2015   CO2 26 05/28/2015    Lab Results  Component Value Date   ALT 18 05/28/2015   AST 24 05/28/2015   ALKPHOS 55 05/28/2015   BILITOT 1.2 05/28/2015    SED RATE (mm/hr)  Date Value    05/27/2015 10  06/16/2014 97*   CRP (mg/dL)  Date Value  16/07/9603 <0.5  06/16/2014 18.6*   Microbiology: Recent Results (from the past 240 hour(s))  Anaerobic culture     Status: None (Preliminary result)   Collection Time: 05/27/15 12:33 PM  Result Value Ref Range Status   Specimen Description WOUND KNEE LEFT  Final   Special Requests LEFT KNEE BURSA/SYNOVIAL CYST  Final   Gram Stain   Final    FEW WBC PRESENT,BOTH PMN AND MONONUCLEAR NO SQUAMOUS EPITHELIAL CELLS SEEN NO ORGANISMS SEEN Performed at [facility]    Culture PENDING  Incomplete   Report Status PENDING  Incomplete  Wound culture     Status: None (Preliminary result)   Collection Time: 05/27/15 12:33 PM  Result Value Ref Range Status   Specimen Description WOUND KNEE LEFT  Final   Special Requests LEFT KNEE BURSA/SYNOVIAL CYST  Final   Gram Stain   Final    FEW WBC PRESENT,BOTH PMN AND MONONUCLEAR NO SQUAMOUS EPITHELIAL CELLS SEEN NO ORGANISMS SEEN Performed at [facility]    Culture NO GROWTH Performed at [facility]   Final   Report Status PENDING  Incomplete    Assessment: Brian Atkinson presents w/  previous patellar fracture surgery complicated by infection of prepatellar sutures w/o removal. Patient was treated w/ 11 weeks of vancomycin and doxyclcine to hopefully cure his infection. Patient developed possible sinus tract w/ drainage indicating likely return of infection given his sutures were still in the joint space. Patient probably has osteomyelitis w/ MRSA again and will need empiric therapy with vancomycin. Current operative cultures show no growth and gram stains were negative for bacteria. Hopefully the cultures will guide therapy but will get IV Vancomycin otherwise. He is scheduled to get a PICC line. He likely needs 4-6 weeks IV antibiotic therapy. Further management will be updated after more information has resulted from his cultures and he improves clinically. His  antibiotics duration can be tailored to his outpatient evaluations as well.   Plan: 1. Continue IV Vancomycin 2. Follow up cultures and target antibiotics to susceptible organisms 3. PICC placement   Zettie Cooley, [facility] 05/28/2015, 10:33 AM   Addendum: I have seen and examined Brian Atkinson today and discussed his care with Darleene Cleaver MS 4. Brian Atkinson is feeling better today and is being discharged home. Operative Gram stain was negative and his cultures are pending. 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 I will arrange follow-up in my clinic.  Cliffton Asters, MD [facility] [phone] pager   [phone] cell 05/28/2015, 4:31 PM

## 2015-05-28 NOTE — Op Note (Signed)
Brian Atkinson                ACCOUNT NO.:  [id number]  MEDICAL RECORD NO.:  [id number]  LOCATION:  5N26C                        FACILITY:  [facility]  PHYSICIAN:  Doralee Albino. Carola Frost, M.D. DATE OF BIRTH:  [birth year]  DATE OF PROCEDURE:  05/27/2015 DATE OF DISCHARGE:                              OPERATIVE REPORT   PREOPERATIVE DIAGNOSIS:  Recurrent bursitis and draining infection with suspected MRSA infected suture.  POSTOPERATIVE DIAGNOSES: 1. Patellar osteomyelitis with sinus tract. 2. Infected nonresorbable suture with associated recurrent bursitis.  PROCEDURES: 1. Partial excision of left patella. 2. Removal of infected implant, FiberWire within both the patella and the patellar tendon. 3. Placement of antibiotic beads.  SURGEON:  Doralee Albino. Carola Frost, M.D.  ASSISTANT:  [name], PA-C.  ANESTHESIA:  General.  COMPLICATIONS:  None.  TOURNIQUET:  None.  SPECIMENS:  Two anaerobic and aerobic cultures sent to Micro prior to administration of perioperative antibiotics.  DISPOSITION:  To PACU.  CONDITION:  Stable.  BRIEF SUMMARY OF INDICATIONS FOR PROCEDURE:  Brian Atkinson is a very pleasant 64 year old male, who had a fracture and associated traumatic wound in September of last year.  This was complicated by MRSA infection of the traumatic wound that then resulted in a deep wound infection, and ultimately was positive for MRSA.  After 2 washouts, he went on to heal uneventfully and was advised at that time, the delay removal of the suture would be necessary.  The patient, however, return to full activity without any symptoms after several months and did not wish to undergo further surgery.  Over the past 6 weeks, he has had recurrent episodes of drainage from the old traumatic wound area as well as a few rounds of antibiotics with improvement, but not resolution of his symptoms from outside physicians.  I saw and discussed with him the risks and benefits of surgical  removal of this contaminated suture and suggested proceeding.  We discussed the potential for persistent infection, failure to resolve all of the symptoms, need for further surgery, DVT, PE, loss of motion, nerve injury, vessel injury, and other perioperative complications such as heart attack, stroke, DVT, PE.  The patient acknowledges these risks and strongly wished to proceed.  OPERATIVE SUMMARY OF PROCEDURE:  Mr. Rodelo was taken to the operating room with his vancomycin being held until cultures could be obtained. Standard prep and drape was performed in the left lower extremity. There was a 3 x 3 mm area that was concerning for chronic draining sinus, but had no expressible drainage at this time.  A curvilinear incision was made to completely ellipse the sinus tracking question. After going through the a full epidermal layer, it could be seen that this clearly was a deep sinus tract.  It was extended proximally and distally and then cultures obtained from the very deep tissues adjacent to the patellar tendon junction.  It was sent for anaerobic and aerobic. Antibiotics were administered at that time.  I continued with exposure of the retinaculum overlying the patellar tendon and was able to identify the FiberWire suture.  There was considerable fibrinous material around the proximal extent of the suture as I entered the bone, tracing it  into the bone, where there was a 3mm plus cavity around the suture there.  I also extended the incision proximally where I countered another bleb of swollen tissue which was incised and revealed another fibrinous pocket around the proximal FiberWire knot area.  We traced this particular FiberWire out distally through the tendon and through the patella into the canal within the bonw.  I could pass the freer through this bone tract.  Suture passers were used to grab the distal end and then pulled it proximally where it was able to be removed in its  entirety.  A curette was used to aggressively debride the bone along the sinus tract, and I partially excised this area of contamination and infection.  It was then irrigated thoroughly.  I did remove some of the suture on the right side of the tendon, but was unable to completely identify the entirety of this suture.  Proximally I did also make a longitudinal cut in the quadriceps tendon over the medial side of the patella, but again was unable to identify the FiberWire there.  This area of bone was scraped in similar fashion to partially excise the patella and remove all infected bone.  Copious irrigation was performed and then we placed the antibiotic beads, inserting these into the osteomyelitis tract within the lateral aspect of the patella and also in the proximal medial patella, the distal patellar tendon, patellar junction and then deep to the patellar tendon where the suture was removed.  Lastly, we did place some in the retinaculum as well.  A standard layered closure was then performed using PDS and nylon with vertical mattress sutures to reduce the potential for contracture at the suture line.  Montez Morita, PA-C assisted me throughout.  No tourniquet was used during the procedure.  The patient had application of sterile gently compressive dressing.  He was then taken to PACU in stable condition.  PROGNOSIS:  Mr. Liew will be weightbearing as tolerated with restrictions against resisted extension, but otherwise no restrictions. I have consulted Dr. Cliffton Asters to assist with his infectious disease management with plans for a PICC line,  IV vanc and overnight admission to accomplish these things logistically.  He is at increased risk for persistent infection because of the resistant bacteria.     Doralee Albino. Carola Frost, M.D.     [facility]/MEDQ  D:  05/27/2015  T:  05/28/2015  Job:  [id number]

## 2015-05-28 NOTE — Care Management Note (Addendum)
Case Management Note  Patient Details  Name: Brian Atkinson MRN: [id number] Date of Birth: 10-24-1950  Subjective/Objective:    64 yr old male admitted with osteo of the left patella, s/p partial excision of left patella.                Action/Plan: Case manager spoke with patient and his wife concerning need for Home Health RN for PICC Care and IV antibiotics. Choice was offered. Patient states he does not need HH therapy and has had PICC before. Referral called to [facility], at [facility], orders faxed.Bonita Quin stated that it will be Monday, May 31, 2015 before home health RN can see patient. Case manager contacted Montez Morita, PA to be sure this is acceptable. Mellody Dance stated that patient has had this treatment before and is comfortable and familiar with PICC so Monday is fine. Case Manager also spoke with Jeri Modena, [facility] IV RN concerning [facility] providing antibiotics and asked her to reinforce medication administration with patient and his wife.    Expected Discharge Date:    05/28/15              Expected Discharge Plan:  Home w Home Health Services  In-House Referral:  NA  Discharge planning Services  CM Consult  Post Acute Care Choice:  Home Health Choice offered to:  Patient  DME Arranged:  N/A DME Agency:  NA  HH Arranged:  RN HH Agency:  [facility], [facility]  Status of Service:  Completed, signed off  Medicare Important Message Given:    Date Medicare IM Given:    Medicare IM give by:    Date Additional Medicare IM Given:    Additional Medicare Important Message give by:     If discussed at Long Length of Stay Meetings, dates discussed:    Additional Comments:  05/28/15 2:21pm  Case manager spoke with Bonita Quin at [facility], they are not able to start care until Tuesday, June 01, 2015. There is no other available home health agency in patient's area of [place] that can accommodate our  needs any sooner.  [facility] Liaison Jeri Modena has reinforced care and infusion of medications with patient and his wife, patient is comfortable with PICC and meds. He understands that Home Health will be out on Tuesday morning and will draw labs prior to medication infusion.       Durenda Guthrie, RN  05/28/2015, 11:22 AM

## 2015-05-28 NOTE — Evaluation (Signed)
Occupational Therapy Evaluation Patient Details Name: Brian Atkinson MRN: [id number] DOB: [birth year] Today's Date: 05/28/2015    History of Present Illness 64 y/o male who has had septic L knee in past and required ORIF L distal patellar pole. Pt has had numerous infections. He has been supressed with abx and healed his fracture. With recurrence of symptoms he presents for removal of suture and busectomy now s/p partial excision of left patella, Removal of infected implant, FiberWire within the patellar tendon and placement of antibiotic beads.    Clinical Impression   Patient admitted with above. Patient independent PTA. Patient currently functioning at a supervision>mod I level. D/C from acute OT services and no additional follow-up OT needs at this time. All appropriate education provided to patient. Please re-order OT if needed.   Patient reports that this is he fourth surgery and his wife can assist at home. He has all needed DME and no follow-up OT needs identified.      Follow Up Recommendations  No OT follow up;Supervision - Intermittent    Equipment Recommendations  None recommended by OT    Recommendations for Other Services  None at this time  Precautions / Restrictions Precautions Precautions: Other (comment) Precaution Comments: no ROM restrictions, however no resisted knee extension Restrictions Weight Bearing Restrictions: Yes LLE Weight Bearing: Weight bearing as tolerated   Mobility Bed Mobility Overal bed mobility: Independent General bed mobility comments: supine to sit independent  Transfers Overall transfer level: Modified independent Equipment used: Rolling walker (2 wheeled) General transfer comment: safe technique    Balance Overall balance assessment: Needs assistance Sitting-balance support: No upper extremity supported Sitting balance-Leahy Scale: Good     Standing balance support: No upper extremity supported Standing balance-Leahy Scale: Fair     ADL Overall ADL's : Needs assistance/impaired General ADL Comments: Pt reports that he does not plan to wear socks, but sandals instead. Pt states this is his fourth surgery and he has been dealing with this for over a year. Pt has a shower seat and BSC. No equipment needs and no follow-up recommended.     Pertinent Vitals/Pain Pain Assessment: 0-10 Pain Score: 2  Pain Location: left knee Pain Descriptors / Indicators: Sore Pain Intervention(s): Monitored during session     Hand Dominance Right   Extremity/Trunk Assessment Upper Extremity Assessment Upper Extremity Assessment: Overall WFL for tasks assessed   Lower Extremity Assessment Lower Extremity Assessment: Defer to PT evaluation LLE Deficits / Details: able to raise leg independently    Cervical / Trunk Assessment Cervical / Trunk Assessment: Normal   Communication Communication Communication: No difficulties   Cognition Arousal/Alertness: Awake/alert Behavior During Therapy: WFL for tasks assessed/performed Overall Cognitive Status: Within Functional Limits for tasks assessed              Home Living Family/patient expects to be discharged to:: Private residence Living Arrangements: Spouse/significant other Available Help at Discharge: Family Type of Home: House Home Access: Level entry     Home Layout: Able to live on main level with bedroom/bathroom     Bathroom Shower/Tub: Tub/shower unit;Walk-in shower;Curtain   Bathroom Toilet: Standard     Home Equipment: Walker - 2 wheels;Crutches;Bedside commode;Shower seat   Additional Comments: plans to use walk-in shower with door      Prior Functioning/Environment Level of Independence: Independent     OT Diagnosis: Generalized weakness;Acute pain   OT Problem List:  n/a, no acute OT needs identified    OT Treatment/Interventions:   n/a, no acute  OT needs identified    OT Goals(Current goals can be found in the care plan section) Acute Rehab  OT Goals Patient Stated Goal: go home today OT Goal Formulation: All assessment and education complete, DC therapy  OT Frequency:   n/a, no acute OT needs identified    Barriers to D/C:  None at this time    End of Session Activity Tolerance: Patient tolerated treatment well Patient left: in chair;with call bell/phone within reach;with family/visitor present   Time: 1610-9604 OT Time Calculation (min): 15 min Charges:  OT General Charges $OT Visit: 1 Procedure OT Evaluation $Initial OT Evaluation Tier I: 1 Procedure  Linell Shawn , MS, OTR/L, CLT Pager: [phone]  05/28/2015, 12:02 PM

## 2015-05-28 NOTE — Progress Notes (Signed)
[facility]  Patient Status: New pt for [facility] this admission though pt has been with [facility] for home IV antibiotics.   [facility] is providing the following services: [facility] for home IV ABX.  [facility] pharmacy team will partner with [facility] who will provide [facility] nursing,  [facility] is not able to staff Mr. Borak with an RN until Tuesday, 06/01/15. Vance Peper, RN, CM is aware and states Montez Morita, PA-C also aware and in agreement pt can DC home after IV ABX teaching and self administer until [facility] can start on 06/01/15.  Pt has self administered home IV ABX with [facility] pharmacy team in the past and did well.  Teaching provided with pt today in his room and pt is competent to administer IV ABX at home starting with next dose tonight around 9-10 PM.   Pt advised to start home dosing schedule of 8A/8P tomorrow morning. Pt and wife acknowledged and voice understanding on  POC home.   [facility] willl deliver IV ABX and supplies to the home tonight by 8 PM.   If patient discharges after hours, please call [phone].   Sedalia Muta 05/28/2015, 3:14 PM

## 2015-05-28 NOTE — Care Management (Signed)
Utilization review completed. Dee Paden, RN Case Manager [phone]. 

## 2015-05-28 NOTE — Progress Notes (Signed)
Peripherally Inserted Central Catheter/Midline Placement  The IV Nurse has discussed with the patient and/or persons authorized to consent for the patient, the purpose of this procedure and the potential benefits and risks involved with this procedure.  The benefits include less needle sticks, lab draws from the catheter and patient may be discharged home with the catheter.  Risks include, but not limited to, infection, bleeding, blood clot (thrombus formation), and puncture of an artery; nerve damage and irregular heat beat.  Alternatives to this procedure were also discussed.  PICC/Midline Placement Documentation  PICC / Midline Single Lumen 06/19/14 PICC Right Basilic 42 cm 2 cm (Active)       Franne Grip Renee 05/28/2015, 2:37 PM

## 2015-05-28 NOTE — Evaluation (Signed)
Physical Therapy Evaluation Patient Details Name: Brian Atkinson MRN: [id number] DOB: September 18, 1951 Today's Date: 05/28/2015   History of Present Illness  64 y/o male who has had septic L knee in past and required ORIF L distal patellar pole. Pt has had numerous infections. He has been supressed with abx and healed his fracture. With recurrence of symptoms he presents for removal of suture and busectomy now s/p partial excision of left patella, Removal of infected implant, FiberWire within the patellar tendon and placement of antibiotic beads.   Clinical Impression  Patient mobilizing very well during session. Patient reports that he feels confident with going home today if possible. He denies having any stairs at home and has all equipment needs met. Patient may benefit from home PT to check ROM and establish HEP within current precautions.     Follow Up Recommendations Supervision for mobility/OOB;Home health PT    Equipment Recommendations  None recommended by PT;Other (comment) (patient reports having all equipment needs met)    Recommendations for Other Services       Precautions / Restrictions Precautions Precautions: Other (comment) Precaution Comments: no resisted knee extension Restrictions Weight Bearing Restrictions: Yes LLE Weight Bearing: Weight bearing as tolerated      Mobility  Bed Mobility Overal bed mobility: Independent             General bed mobility comments: supine to sit independent  Transfers Overall transfer level: Modified independent Equipment used: Rolling walker (2 wheeled)             General transfer comment: safe technique  Ambulation/Gait Ambulation/Gait assistance: Supervision Ambulation Distance (Feet): 200 Feet Assistive device: Rolling walker (2 wheeled) Gait Pattern/deviations: Step-through pattern;Decreased weight shift to left     General Gait Details: stable pattern, no loss of balance  Stairs Stairs:  (patient declined-  states he has level entry)          Wheelchair Mobility    Modified Rankin (Stroke Patients Only)       Balance Overall balance assessment: Needs assistance Sitting-balance support: No upper extremity supported Sitting balance-Leahy Scale: Good     Standing balance support: No upper extremity supported Standing balance-Leahy Scale: Fair                               Pertinent Vitals/Pain Pain Assessment: 0-10 Pain Score: 2  Pain Location: Lt knee Pain Descriptors / Indicators: Sore Pain Intervention(s): Monitored during session    Home Living Family/patient expects to be discharged to:: Private residence Living Arrangements: Spouse/significant other Available Help at Discharge: Family Type of Home: House Home Access: Level entry     Home Layout: Able to live on main level with bedroom/bathroom Home Equipment: Gilford Rile - 2 wheels;Crutches Additional Comments: plans to use walk-in shower with door    Prior Function Level of Independence: Independent               Hand Dominance        Extremity/Trunk Assessment               Lower Extremity Assessment: LLE deficits/detail   LLE Deficits / Details: able to raise leg independently      Communication   Communication: No difficulties  Cognition Arousal/Alertness: Awake/alert Behavior During Therapy: WFL for tasks assessed/performed Overall Cognitive Status: Within Functional Limits for tasks assessed  General Comments      Exercises        Assessment/Plan    PT Assessment Patient needs continued PT services  PT Diagnosis Difficulty walking   PT Problem List Decreased strength;Decreased activity tolerance;Decreased mobility  PT Treatment Interventions DME instruction;Gait training;Stair training;Functional mobility training;Therapeutic activities;Therapeutic exercise;Patient/family education   PT Goals (Current goals can be found in the Care  Plan section) Acute Rehab PT Goals Patient Stated Goal: go home today PT Goal Formulation: With patient Time For Goal Achievement: 06/11/15 Potential to Achieve Goals: Good    Frequency Min 5X/week   Barriers to discharge        Co-evaluation               End of Session Equipment Utilized During Treatment: Gait belt Activity Tolerance: Patient tolerated treatment well Patient left: in chair;with call bell/phone within reach;with family/visitor present Nurse Communication: Mobility status         Time: 3976-7341 PT Time Calculation (min) (ACUTE ONLY): 30 min   Charges:   PT Evaluation $Initial PT Evaluation Tier I: 1 Procedure PT Treatments $Gait Training: 8-22 mins   PT G Codes:        Cassell Clement, PT, CSCS Pager [phone] Office [phone]  05/28/2015, 11:04 AM

## 2015-05-28 NOTE — Progress Notes (Signed)
[facility] Progress Note  Subjective  Doing very well this morning Has already walked down the back in the hall Pain level about a 3 out of 10 Ready to go home  Waiting for PICC line to be placed  Review of Systems  Constitutional: Negative for fever and chills.  Respiratory: Negative for shortness of breath and wheezing.   Cardiovascular: Negative for chest pain and palpitations.  Gastrointestinal: Negative for nausea, vomiting and abdominal pain.  Neurological: Negative for tingling, sensory change and headaches.     Objective   BP 144/82 mmHg  Pulse 67  Temp(Src) 97.9 F (36.6 C) (Oral)  Resp 16  Ht 5' 11.5" (1.816 m)  Wt 95.255 kg (210 lb)  BMI 28.88 kg/m2  SpO2 97%  Intake/Output      08/18 0701 - 08/19 0700 08/19 0701 - 08/20 0700   P.O.  240   I.V. (mL/kg) 1450 (15.2)    Total Intake(mL/kg) 1450 (15.2) 240 (2.5)   Blood 50    Total Output 50     Net +1400 +240          Labs  Intraoperative cultures are pending  Results for Brian, Atkinson (MRN [id number]) as of 05/28/2015 10:08  Ref. Range 05/28/2015 03:55  Sodium Latest Ref Range: 135-145 mmol/L 136  Potassium Latest Ref Range: 3.5-5.1 mmol/L 3.9  Chloride Latest Ref Range: 101-111 mmol/L 104  CO2 Latest Ref Range: 22-32 mmol/L 26  BUN Latest Ref Range: 6-20 mg/dL 13  Creatinine Latest Ref Range: 0.61-1.24 mg/dL 1.15  Calcium Latest Ref Range: 8.9-10.3 mg/dL 8.1 (L)  EGFR (Non-African Amer.) Latest Ref Range: >60 mL/min >60  EGFR (African American) Latest Ref Range: >60 mL/min >60  Glucose Latest Ref Range: 65-99 mg/dL 113 (H)  Anion gap Latest Ref Range: 5-15  6  Alkaline Phosphatase Latest Ref Range: 38-126 U/L 55  Albumin Latest Ref Range: 3.5-5.0 g/dL 2.8 (L)  AST Latest Ref Range: 15-41 U/L 24  ALT Latest Ref Range: 17-63 U/L 18  Total Protein Latest Ref Range: 6.5-8.1 g/dL 5.5 (L)  Total Bilirubin Latest Ref Range: 0.3-1.2 mg/dL 1.2  WBC Latest Ref Range: 4.0-10.5 K/uL 9.4   RBC Latest Ref Range: 4.22-5.81 MIL/uL 4.08 (L)  Hemoglobin Latest Ref Range: 13.0-17.0 g/dL 12.4 (L)  HCT Latest Ref Range: 39.0-52.0 % 37.4 (L)  MCV Latest Ref Range: 78.0-100.0 fL 91.7  MCH Latest Ref Range: 26.0-34.0 pg 30.4  MCHC Latest Ref Range: 30.0-36.0 g/dL 33.2  RDW Latest Ref Range: 11.5-15.5 % 12.9  Platelets Latest Ref Range: 150-400 K/uL 197   Results for Brian, Atkinson (MRN [id number]) as of 05/28/2015 10:08  Ref. Range 05/27/2015 19:06  CRP Latest Ref Range: <1.0 mg/dL <0.5  Results for Brian, Atkinson (MRN [id number]) as of 05/28/2015 10:08  Ref. Range 05/27/2015 19:06  Sed Rate Latest Ref Range: 0-16 mm/hr 10     Exam  Gen: Awake and alert, sitting in bedside chair no acute distress Lungs: Clear bilaterally Cardiac: Regular rhythm, S1 and S2 Abd:+ Bowel sounds, nontender nondistended Ext:       Left lower extremity  Dressing clean dry and intact  Extremity is warm  Distal motor and sensory functions are intact  + DP pulse  Swelling stable    Assessment and Plan   POD/HD#: 40   64 year old white male with recurrent bursitis and draining infection, patellar osteomyelitis  1. 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 ID  Appreciate assistance from ID service  PICC line to be placed  Vancomycin for 6 weeks, will adjust according to culture results  3. Pain control  Oral pain medication  4. FEN  Regular diet  5. DVT and PE prophylaxis  No pharmacologic required at discharge as patient is mobile  6. Disposition  DC home once patient is PICC line place and home IV antibiotic arranged  Jari Pigg, PA-C [facility] [phone]  [phone] (O) 05/28/2015 10:04 AM

## 2015-05-30 LAB — WOUND CULTURE: CULTURE: NO GROWTH

## 2015-06-01 LAB — ANAEROBIC CULTURE

## 2015-06-22 ENCOUNTER — Ambulatory Visit: Payer: No Typology Code available for payment source | Admitting: Internal Medicine

## 2015-06-28 ENCOUNTER — Telehealth: Payer: Self-pay | Admitting: *Deleted

## 2015-06-28 ENCOUNTER — Encounter: Payer: Self-pay | Admitting: Internal Medicine

## 2015-06-28 ENCOUNTER — Ambulatory Visit (INDEPENDENT_AMBULATORY_CARE_PROVIDER_SITE_OTHER): Payer: No Typology Code available for payment source | Admitting: Internal Medicine

## 2015-06-28 VITALS — BP 144/86 | HR 73 | Temp 98.1°F | Ht 71.5 in | Wt 237.0 lb

## 2015-06-28 DIAGNOSIS — R52 Pain, unspecified: Secondary | ICD-10-CM

## 2015-06-28 DIAGNOSIS — M009 Pyogenic arthritis, unspecified: Secondary | ICD-10-CM | POA: Diagnosis not present

## 2015-06-28 DIAGNOSIS — M869 Osteomyelitis, unspecified: Secondary | ICD-10-CM | POA: Diagnosis not present

## 2015-06-28 MED ORDER — SULFAMETHOXAZOLE-TRIMETHOPRIM 800-160 MG PO TABS: 1.0000 | ORAL_TABLET | Freq: Two times a day (BID) | ORAL | Status: DC

## 2015-06-28 NOTE — Assessment & Plan Note (Signed)
Doing well again and feels he is on the right track. I will transition him to oral continuation therapy with Bactrim and stop his IV medication.  He will get an electrolyte panel in [place] and have it faxed here in 3-4 weeks and fu with Dr. Orvan Falconer in 2 months.

## 2015-06-28 NOTE — Telephone Encounter (Signed)
Verbal order per Dr. Luciana Axe given to Debbie at [facility] to pull patient's picc line tomorrow. Pharmacy is [facility] and was out sourced by [facility]. [facility] will pull patient's picc line tomorrow. Wendall Mola
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
## 2015-06-28 NOTE — Assessment & Plan Note (Signed)
No pain at this time.

## 2015-07-06 DIAGNOSIS — M719 Bursopathy, unspecified: Secondary | ICD-10-CM | POA: Diagnosis present

## 2015-07-06 DIAGNOSIS — T8484XA Pain due to internal orthopedic prosthetic devices, implants and grafts, initial encounter: Secondary | ICD-10-CM | POA: Diagnosis present
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
## 2015-07-09 LAB — AFB CULTURE WITH SMEAR (NOT AT ARMC): Acid Fast Smear: NONE SEEN

## 2015-07-13 ENCOUNTER — Other Ambulatory Visit: Payer: Self-pay | Admitting: Internal Medicine

## 2015-07-13 MED ORDER — SULFAMETHOXAZOLE-TRIMETHOPRIM 800-160 MG PO TABS: 1.0000 | ORAL_TABLET | Freq: Two times a day (BID) | ORAL | Status: DC

## 2015-08-06 ENCOUNTER — Telehealth: Payer: Self-pay | Admitting: *Deleted

## 2015-08-06 NOTE — Telephone Encounter (Signed)
It is in my box.  He is on Bactrim so mainly looking at creat and K.  thanks

## 2015-08-06 NOTE — Telephone Encounter (Signed)
Left message letting patient know that his labs are within normal limits, and that everything we are monitoring for his antibiotic therapy are good. Creatinine = 1.08, potassium = 4.4 on 10/17

## 2015-08-06 NOTE — Telephone Encounter (Signed)
Patient is calling for his lab results from 10/17.  No labs in EPIC, please advise what to relay to patient. Andree CossHowell, Andrey Mccaskill M, RN

## 2015-08-30 ENCOUNTER — Ambulatory Visit (INDEPENDENT_AMBULATORY_CARE_PROVIDER_SITE_OTHER): Payer: No Typology Code available for payment source | Admitting: Internal Medicine

## 2015-08-30 ENCOUNTER — Encounter: Payer: Self-pay | Admitting: Internal Medicine

## 2015-08-30 VITALS — BP 137/81 | HR 94 | Temp 97.5°F | Wt 238.0 lb

## 2015-08-30 DIAGNOSIS — M00062 Staphylococcal arthritis, left knee: Secondary | ICD-10-CM | POA: Diagnosis not present

## 2015-08-30 NOTE — Progress Notes (Signed)
[facility]  Patient Active Problem List   Diagnosis Date Noted  . 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 Patella fracture 06/12/2014    Priority: Medium  . Painful orthopaedic hardware 07/06/2015  . Bursitis 07/06/2015  . Osteomyelitis (HCC) 05/27/2015  . Unspecified constipation 06/20/2014  . Postoperative anemia due to acute blood loss 06/18/2014  . Iron deficiency anemia 06/18/2014  . Hyponatremia 06/16/2014  . Uncontrolled pain 06/16/2014    Patient's Medications  New Prescriptions   No medications on file  Previous Medications   No medications on file  Modified Medications   No medications on file  Discontinued Medications   METHOCARBAMOL (ROBAXIN) 500 MG TABLET    Take 1-2 tablets (500-1,000 mg total) by mouth every 6 (six) hours as needed for muscle spasms.   OXYCODONE-ACETAMINOPHEN (ROXICET) 5-325 MG PER TABLET    Take 1-2 tablets by mouth every 6 (six) hours as needed for moderate pain or severe pain.   SULFAMETHOXAZOLE-TRIMETHOPRIM (BACTRIM DS,SEPTRA DS) 800-160 MG TABLET    Take 1 tablet by mouth 2 (two) times daily.    Subjective: [name] is in for his routine follow-up visit. 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 He had the fibro-wire sutures removed and antibiotic beads placed. He completed 6 weeks of IV vancomycin and then was converted to oral trimethoprim sulfamethoxazole. He is now completed 3 months of antibiotic therapy. He has had no problems tolerating his antibiotic and he is feeling much better. He is not requiring any pain medication. He occasionally notes a little bit of warmth at the end of the day if he has been very physically active but otherwise no problems. He started back running yesterday for the first time in 1 year.  Review of Systems: Review of Systems  Constitutional: Negative  for fever, chills and diaphoresis.  Gastrointestinal: Negative for nausea, vomiting, abdominal pain and diarrhea.  Musculoskeletal: Negative for joint pain.    Past Medical History  Diagnosis Date  . Patella fracture     left     Social History  Substance Use Topics  . Smoking status: Never Smoker   . Smokeless tobacco: Never Used  . Alcohol Use: Yes     Comment: 05/27/2015 "might have a few drinks a few times/year"    No family history on file.  No Known Allergies  Objective: Filed Vitals:   08/30/15 1339  BP: 137/81  Pulse: 94  Temp: 97.5 F (36.4 C)  TempSrc: Oral  Weight: 238 lb (107.956 kg)   Body mass index is 32.74 kg/(m^2).  Physical Exam  Constitutional: He is oriented to person, place, and time. No distress.  He is smiling and in good spirits as usual.  Cardiovascular: Normal rate and regular rhythm.   No murmur heard. Pulmonary/Chest: Breath sounds normal.  Musculoskeletal:  His left knee incision is well-healed. He has no swelling, warmth or tenderness with range of motion.  Neurological: He is alert and oriented to person, place, and time.  Skin: No rash noted.  Psychiatric: Mood and affect normal.    Lab Results    Problem List Items Addressed This Visit      High   Septic joint of left knee joint (HCC) - Primary    I'm hopeful that his infection is now healed following a second surgery,  removal of his old sutures, debridement of his patella and 3 months of antibiotics. He will stop antibiotics now and follow with me in 2 months.          Cliffton Asters, MD [facility] [phone] pager   [phone] cell 08/30/2015, 1:56 PM

## 2015-08-30 NOTE — Assessment & Plan Note (Signed)
I'm hopeful that his infection is now healed following a second surgery, removal of his old sutures, debridement of his patella and 3 months of antibiotics. He will stop antibiotics now and follow with me in 2 months.

## 2015-11-02 ENCOUNTER — Telehealth: Payer: Self-pay | Admitting: *Deleted

## 2015-11-02 ENCOUNTER — Ambulatory Visit: Payer: No Typology Code available for payment source | Admitting: Internal Medicine

## 2015-11-02 NOTE — Telephone Encounter (Signed)
Left message requesting the pt call [facility] for a new appt.

## 2015-11-08 ENCOUNTER — Encounter: Payer: Self-pay | Admitting: Internal Medicine

## 2015-11-08 ENCOUNTER — Ambulatory Visit (INDEPENDENT_AMBULATORY_CARE_PROVIDER_SITE_OTHER): Payer: Managed Care, Other (non HMO) | Admitting: Internal Medicine

## 2015-11-08 VITALS — BP 146/97 | HR 94 | Wt 239.8 lb

## 2015-11-08 DIAGNOSIS — M00062 Staphylococcal arthritis, left knee: Secondary | ICD-10-CM | POA: Diagnosis not present

## 2015-11-08 NOTE — Progress Notes (Signed)
         [facility]  Patient Active Problem List   Diagnosis Date Noted  . 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 Patella fracture 06/12/2014    Priority: Medium  . Painful orthopaedic hardware 07/06/2015  . Bursitis 07/06/2015  . Osteomyelitis (HCC) 05/27/2015  . Unspecified constipation 06/20/2014  . Postoperative anemia due to acute blood loss 06/18/2014  . Iron deficiency anemia 06/18/2014  . Hyponatremia 06/16/2014  . Uncontrolled pain 06/16/2014    Patient's Medications   No medications on file    Subjective: Brian Atkinson is in for his routine follow-up visit. 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 He received a second course of IV vancomycin followed by oral trimethoprim sulfamethoxazole. He is now been off of all antibiotics since 08/30/2015. [place] has resumed [place] and has been doing physical therapy at home. About 1 week ago he started having some increased left knee pain. He stopped his physical therapy and ice to his knee and has felt better for the past 48 hours.  Review of Systems: Review of Systems  Constitutional: Negative for fever, chills and diaphoresis.  Musculoskeletal: Positive for joint pain.    Past Medical History  Diagnosis Date  . Patella fracture     left     Social History  Substance Use Topics  . Smoking status: Never Smoker   . Smokeless tobacco: Never Used  . Alcohol Use: Yes     Comment: 05/27/2015 "might have a few drinks a few times/year"    No family history on file.  No Known Allergies  Objective: Filed Vitals:   11/08/15 1438  BP: 146/97  Pulse: 94  Weight: 239 lb 12.8 oz (108.773 kg)   Body mass index is 32.98  kg/(m^2).  Physical Exam  Constitutional:  He is smiling and in good spirits.  Musculoskeletal:  His left knee incision is completely healed. There is no unusual swelling, redness or warmth. He has some slight tenderness over the patella laterally.    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 He seems to be getting better after stopping physical therapy 48 hours ago. I will continue to observe off antibiotics and see him back in 2 weeks.          Cliffton Asters, MD [facility] [phone] pager   [phone] cell 11/08/2015, 2:57 PM

## 2015-11-08 NOTE — Assessment & Plan Note (Signed)
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 He seems to be getting better after stopping physical therapy 48 hours ago. I will continue to observe off antibiotics and see him back in 2 weeks.

## 2015-11-23 ENCOUNTER — Ambulatory Visit: Payer: Managed Care, Other (non HMO) | Admitting: Internal Medicine

## 2016-01-10 ENCOUNTER — Other Ambulatory Visit: Payer: Self-pay | Admitting: Internal Medicine

## 2017-04-15 ENCOUNTER — Emergency Department (HOSPITAL_COMMUNITY): Payer: Medicare Other

## 2017-04-15 ENCOUNTER — Encounter (HOSPITAL_COMMUNITY): Payer: Self-pay | Admitting: *Deleted

## 2017-04-15 ENCOUNTER — Emergency Department (HOSPITAL_COMMUNITY)
Admission: EM | Admit: 2017-04-15 | Discharge: 2017-04-15 | Disposition: A | Discharge status: Home / Self Care | Payer: Medicare Other | Attending: Emergency Medicine | Admitting: Emergency Medicine

## 2017-04-15 DIAGNOSIS — L03116 Cellulitis of left lower limb: Secondary | ICD-10-CM

## 2017-04-15 DIAGNOSIS — M25562 Pain in left knee: Secondary | ICD-10-CM | POA: Diagnosis present

## 2017-04-15 MED ORDER — DOXYCYCLINE HYCLATE 100 MG PO CAPS: 100.0000 mg | ORAL_CAPSULE | Freq: Two times a day (BID) | ORAL | 0 refills | Status: DC

## 2017-04-15 NOTE — ED Triage Notes (Signed)
Pt has hx of infection in his left knee after having surgery in 2015. Pt states over the last 3 weeks he has had intermittent swelling and redness to this knee.

## 2017-04-15 NOTE — Discharge Instructions (Signed)
Elevate your knee when possible.  Follow-up with your primary or orthopedic doctor for recheck.  Return here for any worsening symptoms

## 2017-04-16 ENCOUNTER — Telehealth: Payer: Self-pay | Admitting: *Deleted

## 2017-04-16 NOTE — Telephone Encounter (Signed)
Note routed to MD

## 2017-04-16 NOTE — Telephone Encounter (Signed)
Please see if he can come in and see me in clinic tomorrow morning at 10 AM.

## 2017-04-16 NOTE — Telephone Encounter (Signed)
-----   Message from Beatris Shipourtney L Nelson sent at 04/16/2017 10:11 AM EDT ----- Brian ProwsBelieves MRSA is back and said Dr Orvan Falconerampbell said just to call and we could get him in for a visit. Last seen in 11/2015. Just seeing if we need a referral for this apt to be scheduled. Thanks

## 2017-04-17 ENCOUNTER — Encounter: Payer: Self-pay | Admitting: Internal Medicine

## 2017-04-17 ENCOUNTER — Ambulatory Visit (INDEPENDENT_AMBULATORY_CARE_PROVIDER_SITE_OTHER): Payer: Medicare Other | Admitting: Internal Medicine

## 2017-04-17 DIAGNOSIS — M00262 Other streptococcal arthritis, left knee: Secondary | ICD-10-CM

## 2017-04-17 LAB — BASIC METABOLIC PANEL
BUN: 16 mg/dL (ref 7–25)
CHLORIDE: 105 mmol/L (ref 98–110)
CO2: 25 mmol/L (ref 20–31)
Calcium: 9.2 mg/dL (ref 8.6–10.3)
Creat: 0.98 mg/dL (ref 0.70–1.25)
Glucose, Bld: 97 mg/dL (ref 65–99)
POTASSIUM: 4.6 mmol/L (ref 3.5–5.3)
SODIUM: 138 mmol/L (ref 135–146)

## 2017-04-17 LAB — CBC
HEMATOCRIT: 45 % (ref 38.5–50.0)
Hemoglobin: 15.1 g/dL (ref 13.2–17.1)
MCH: 30.7 pg (ref 27.0–33.0)
MCHC: 33.6 g/dL (ref 32.0–36.0)
MCV: 91.5 fL (ref 80.0–100.0)
MPV: 9.1 fL (ref 7.5–12.5)
PLATELETS: 248 10*3/uL (ref 140–400)
RBC: 4.92 MIL/uL (ref 4.20–5.80)
RDW: 13.5 % (ref 11.0–15.0)
WBC: 9.6 10*3/uL (ref 3.8–10.8)

## 2017-04-17 MED ORDER — DOXYCYCLINE HYCLATE 100 MG PO CAPS: 100.0000 mg | ORAL_CAPSULE | Freq: Two times a day (BID) | ORAL | 0 refills | Status: DC

## 2017-04-17 NOTE — Assessment & Plan Note (Signed)
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 I think the lesser evil is to have him stay on doxycycline for now. I will obtain blood work today and like him to see Dr. Carola FrostHandy as soon as possible.

## 2017-04-17 NOTE — Progress Notes (Signed)
[facility]  Patient Active Problem List   Diagnosis Date Noted  . 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 Patella fracture 06/12/2014    Priority: Medium  . Painful orthopaedic hardware (HCC) 07/06/2015  . Bursitis 07/06/2015  . Osteomyelitis (HCC) 05/27/2015  . Unspecified constipation 06/20/2014  . Postoperative anemia due to acute blood loss 06/18/2014  . Iron deficiency anemia 06/18/2014  . Hyponatremia 06/16/2014  . Uncontrolled pain 06/16/2014    Patient's Medications  New Prescriptions   No medications on file  Previous Medications   IBUPROFEN-FAMOTIDINE (DUEXIS) 800-26.6 MG TABS    Take by mouth 3 (three) times daily.  Modified Medications   Modified Medication Previous Medication   DOXYCYCLINE (VIBRAMYCIN) 100 MG CAPSULE doxycycline (VIBRAMYCIN) 100 MG capsule      Take 1 capsule (100 mg total) by mouth 2 (two) times daily.    Take 1 capsule (100 mg total) by mouth 2 (two) times daily.  Discontinued Medications   No medications on file    Subjective: Mr. Skow is seen on a work in basis. He suffered a left patellar fracture and 2015 and underwent open reduction and internal fixation. 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 He underwent debridement and a long course of IV antibiotics but relapsed in 2016. Dr. Myrene Galas performed surgery in August 2016 and removed the fibro-wire sutures in the patella. He completed antibiotic therapy in January 2017. He had no problems until last week when he developed pain and swelling of his left knee. There is one particular area just inferior and lateral to his previously healed incision that has been swelling. He has been applying ice and taking ibuprofen. When the pain got very bad he started taking some Bactrim that was left over from his antibiotic supply. The pain was so bad that he went to the emergency department  at [facility] last weekend. X-rays there showed spurring and deformity of the patella but no acute changes. The Bactrim was changed to doxycycline. He is scheduled to see Dr. Carola Frost neck week.  Review of Systems: Review of Systems  Constitutional: Negative for chills, diaphoresis and fever.  Respiratory: Negative for cough.   Cardiovascular: Negative for chest pain.  Gastrointestinal: Negative for abdominal pain, diarrhea, nausea and vomiting.  Genitourinary: Negative for dysuria.  Musculoskeletal: Positive for joint pain.  Skin: Negative for rash.  Neurological: Negative for headaches.    Past Medical History:  Diagnosis Date  . Patella fracture    left     Social History  Substance Use Topics  . Smoking status: Never Smoker  . Smokeless tobacco: Never Used  . Alcohol use Yes     Comment: 05/27/2015 "might have a few drinks a few times/year"    No family history on file.  No Known Allergies  Objective: Vitals:   04/17/17 0952  BP: (!) 142/89  Pulse: 70  Temp: 97.8 F (36.6 C)  TempSrc: Oral  Weight: 240 lb (108.9 kg)  Height: 5\' 11"  (1.803 m)   Body mass index is 33.47 kg/m.  Physical Exam  Constitutional: He is oriented to person, place, and time.  He is appropriately concerned but otherwise in good spirits.  Cardiovascular: Normal rate and regular rhythm.   No murmur heard. Pulmonary/Chest: Effort normal and breath sounds normal.  Abdominal: Soft. There is no tenderness.  Musculoskeletal:  He has  a healed incision over his left knee. He has diffuse swelling and warmth of his knee. There is a silver dollar sized area of fluctuance and erythema lateral to the lower part of the incision. There is no drainage.  Neurological: He is alert and oriented to person, place, and time.  Skin: No rash noted.  Psychiatric: Mood and affect normal.    Lab Results    Problem List Items Addressed This Visit      High   Septic joint of left knee joint (HCC)     He appears to have developed recurrent infection of the prepatellar bursa and possibly his knee joint. I think the lesser evil is to have him stay on doxycycline for now. I will obtain blood work today and like him to see Dr. Carola FrostHandy as soon as possible.      Relevant Medications   Ibuprofen-Famotidine (DUEXIS) 800-26.6 MG TABS   Other Relevant Orders   CBC   Basic metabolic panel   C-reactive protein   Sedimentation rate       Cliffton AstersJohn Dorman Calderwood, MD [facility] [phone] pager   [phone] cell 04/17/2017, 10:36 AM

## 2017-04-17 NOTE — Telephone Encounter (Signed)
Patient added to schedule. Brian Atkinson

## 2017-04-17 NOTE — ED Provider Notes (Signed)
[facility]-EMERG[facility]Y DEPT Provider Note   CSN: [id number] Arrival date & time: 04/15/17  1417     History   Chief Complaint Chief Complaint  Patient presents with  . Knee Pain    HPI Brian Atkinson is a 66 y.o. male.  HPI  Brian Atkinson is a 66 y.o. male who presents to the Emergency Department complaining of focal redness and tenderness of the left knee.  Reports hx of MRSA of the knee joint in 2015 after surgery to repair a traumatic injury.  Notes having subsequent surgeries to knee due to the MRSA.  He reports noticing redness, pain and swelling to the lower knee for 3 weeks.  Pain worse with bending.  He denies fever, chills, drainage or distal pain.  Past Medical History:  Diagnosis Date  . Patella fracture    left     Patient Active Problem List   Diagnosis Date Noted  . Painful orthopaedic hardware (HCC) 07/06/2015  . Bursitis 07/06/2015  . Osteomyelitis (HCC) 05/27/2015  . Unspecified constipation 06/20/2014  . Postoperative anemia due to acute blood loss 06/18/2014  . Septic joint of left knee joint (HCC) 06/18/2014  . Iron deficiency anemia 06/18/2014  . Hyponatremia 06/16/2014  . Uncontrolled pain 06/16/2014  . Patella fracture 06/12/2014    Past Surgical History:  Procedure Laterality Date  . DEBRIDEMENT AND CLOSURE WOUND Left 06/19/2014   Procedure: DEBRIDEMENT AND CLOSURE LEFT KNEE;  Surgeon: Kathryne Hitch, MD;  Location: [facility] ORS;  Service: Orthopedics;  Laterality: Left;  . IRRIGATION AND DEBRIDEMENT KNEE Left 06/17/2014   Procedure: IRRIGATION AND DEBRIDEMENT KNEE;  Surgeon: Budd Palmer, MD;  Location: [facility] ORS;  Service: Orthopedics;  Laterality: Left;  . KNEE BURSECTOMY Left 05/27/2015  . KNEE BURSECTOMY Left 05/27/2015   Procedure: KNEE BURSECTOMY, suture removal patellar tendon;  Surgeon: Myrene Galas, MD;  Location: [facility] OR;  Service: Orthopedics;  Laterality: Left;  . ORIF PATELLA Left 06/12/2014   Procedure: OPEN REDUCTION INTERNAL (ORIF) FIXATION  PATELLA;  Surgeon: Budd Palmer, MD;  Location: [facility] OR;  Service: Orthopedics;  Laterality: Left;  . REFRACTIVE SURGERY Bilateral   . TONSILLECTOMY         Home Medications    Prior to Admission medications   Medication Sig Start Date End Date Taking? Authorizing Provider  doxycycline (VIBRAMYCIN) 100 MG capsule Take 1 capsule (100 mg total) by mouth 2 (two) times daily. 04/17/17   Cliffton Asters, MD  Ibuprofen-Famotidine (DUEXIS) 800-26.6 MG TABS Take by mouth 3 (three) times daily.    [provider]    Family History No family history on file.  Social History Social History  Substance Use Topics  . Smoking status: Never Smoker  . Smokeless tobacco: Never Used  . Alcohol use Yes     Comment: 05/27/2015 "might have a few drinks a few times/year"     Allergies   Patient has no known allergies.   Review of Systems Review of Systems  Constitutional: Negative for chills and fever.  Respiratory: Negative for shortness of breath.   Cardiovascular: Negative for chest pain.  Musculoskeletal: Positive for arthralgias (left knee pain). Negative for joint swelling.  Skin: Negative for color change and wound.  Neurological: Negative for weakness and numbness.  All other systems reviewed and are negative.    Physical Exam Updated Vital Signs BP (!) 144/88 (BP Location: Right Arm)   Pulse 79   Temp 97.7 F (36.5 C) (Oral)   Resp 18   Ht 5'  11" (1.803 m)   Wt 108.4 kg (239 lb)   SpO2 97%   BMI 33.33 kg/m   Physical Exam  Constitutional: He is oriented to person, place, and time. He appears well-developed and well-nourished. No distress.  Cardiovascular: Normal rate, regular rhythm and intact distal pulses.   Pulmonary/Chest: Effort normal and breath sounds normal.  Musculoskeletal: He exhibits tenderness.  Small focal area of erythema of the distal left knee.  No effusion of the joint.  No excessive warmth.    Neurological: He is alert and oriented to  person, place, and time. No sensory deficit. He exhibits normal muscle tone. Coordination normal.  Skin: Skin is warm and dry. Capillary refill takes less than 2 seconds. No erythema.  Nursing note and vitals reviewed.    ED Treatments / Results  Labs (all labs ordered are listed, but only abnormal results are displayed) Labs Reviewed - No data to display  EKG  EKG Interpretation None       Radiology Dg Knee Complete 4 Views Left  Result Date: 04/15/2017 CLINICAL DATA:  Pain EXAM: LEFT KNEE - COMPLETE 4+ VIEW COMPARISON:  June 17, 2014 FINDINGS: Frontal, lateral, and bilateral oblique views were obtained. No acute fracture or dislocation. There is remodeling in the inferior patellar region at the site of previous fracture. There is no appreciable joint effusion. There is marked spurring along the anterior and posterior aspects of the patella. There is moderate patellofemoral joint space narrowing. There is milder narrowing in the medial compartment. No erosive change. 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 Extensive spurring anteriorly in the patellar region. Old trauma with remodeling inferior patella. No acute fracture or dislocation. No appreciable joint effusion. Electronically Signed   By: Bretta BangWilliam  Woodruff III M.D.   On: 04/15/2017 15:24     Procedures Procedures (including critical care time)  Medications Ordered in ED Medications - No data to display   Initial Impression / Assessment and Plan / ED Course  I have reviewed the triage vital signs and the nursing notes.  Pertinent labs & imaging results that were available during my care of the patient were reviewed by me and considered in my medical decision making (see chart for details).     Small area of erythema of the distal knee.  NV intact.  No motor deficits.  No concerning sx's for septic joint. Vitals stable.  Pt ambulatory with steady gait.  Well appearing.   Erythema appearing superficial.  Pt has up coming appt with his orthopedic, will tx with abx, appears stable for d/c.  Return precautions discussed.    Final Clinical Impressions(s) / ED Diagnoses   Final diagnoses:  Cellulitis of left knee    New Prescriptions Discharge Medication List as of 04/15/2017  3:36 PM    START taking these medications   Details  doxycycline (VIBRAMYCIN) 100 MG capsule Take 1 capsule (100 mg total) by mouth 2 (two) times daily., Starting Sun 04/15/2017, Print         Lyndia Bury, PA-C 04/17/17 2126    Linwood DibblesKnapp, Jon, MD 04/18/17 1000

## 2017-04-18 ENCOUNTER — Encounter: Payer: Self-pay | Admitting: Internal Medicine

## 2017-04-18 LAB — C-REACTIVE PROTEIN: CRP: 1.7 mg/L (ref <8.0)

## 2017-04-18 LAB — SEDIMENTATION RATE: SED RATE: 9 mm/h (ref 0–20)

## 2017-04-20 ENCOUNTER — Other Ambulatory Visit (HOSPITAL_COMMUNITY): Payer: Self-pay | Admitting: Orthopedic Surgery

## 2017-04-20 DIAGNOSIS — M009 Pyogenic arthritis, unspecified: Secondary | ICD-10-CM

## 2017-04-24 ENCOUNTER — Ambulatory Visit (HOSPITAL_COMMUNITY)
Admission: RE | Admit: 2017-04-24 | Discharge: 2017-04-24 | Disposition: A | Discharge status: Home / Self Care | Payer: Medicare Other | Source: Ambulatory Visit | Attending: Orthopedic Surgery | Admitting: Orthopedic Surgery

## 2017-04-24 DIAGNOSIS — Z9889 Other specified postprocedural states: Secondary | ICD-10-CM | POA: Insufficient documentation

## 2017-04-24 DIAGNOSIS — M228X2 Other disorders of patella, left knee: Secondary | ICD-10-CM | POA: Diagnosis not present

## 2017-04-24 DIAGNOSIS — M009 Pyogenic arthritis, unspecified: Secondary | ICD-10-CM | POA: Insufficient documentation

## 2017-04-24 MED ORDER — GADOBENATE DIMEGLUMINE 529 MG/ML IV SOLN
20.0000 mL | Freq: Once | INTRAVENOUS | Status: AC | PRN
  Administered 2017-04-24: 20 mL via INTRAVENOUS

## 2017-04-25 ENCOUNTER — Encounter: Payer: Self-pay | Admitting: Internal Medicine

## 2017-04-26 ENCOUNTER — Telehealth: Payer: Self-pay | Admitting: *Deleted

## 2017-04-26 ENCOUNTER — Telehealth: Payer: Self-pay | Admitting: Internal Medicine

## 2017-04-26 NOTE — Telephone Encounter (Signed)
I reviewed your MRI with Dr. Carola FrostHandy today. There is some inflammation seen extending from the skin where you note the swelling and scab down to the knee. 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 There may be some retained suture down in the bone. I would recommend continuing doxycycline for now. Dr. Carola FrostHandy will be in touch with you when he gets back in town. If you have any questions just let me know.  Miloh Alcocer ===View-only below this line===   ----- Message -----    From: Brian Atkinson    Sent: 04/25/2017  5:37 PM EDT      To: [name], MD Subject: Visit Follow-Up Question  Dr. Orvan Falconerampbell, I just wanted to let you know that I had my MRI (with and without contrast) on my left knee completed last night at [facility] [place]  I was told that it would be reviewed this morning by [name] and then uploaded to the [facility] system this afternoon. Dr. Carola FrostHandy is out of town this week.  Could you please review the MRI results and let me know what the findings were? My knee is still swollen. The lump on my knee has formed a scab and is still hot to the touch.  I'm still taking the Doxycycline 100 mg twice a day. Thank You. Brian Atkinson

## 2017-04-26 NOTE — Telephone Encounter (Signed)
Gwen from Dr Magdalene PatriciaHandy's office called at his request.  Patient had an MRI on 7/17. Dr Carola FrostHandy would like Dr Orvan Falconerampbell to review this, call him to discuss. Dr Carola FrostHandy is out of the office until Monday, but can be reached on his cell at [phone] per [facility].  Andree CossHowell, Makaley Storts M, RN

## 2017-05-04 ENCOUNTER — Encounter: Payer: Self-pay | Admitting: Internal Medicine

## 2017-05-09 ENCOUNTER — Encounter: Payer: Self-pay | Admitting: Internal Medicine

## 2017-05-15 ENCOUNTER — Encounter: Payer: Self-pay | Admitting: Internal Medicine

## 2017-05-15 ENCOUNTER — Ambulatory Visit (INDEPENDENT_AMBULATORY_CARE_PROVIDER_SITE_OTHER): Payer: Medicare Other | Admitting: Internal Medicine

## 2017-05-15 DIAGNOSIS — M00062 Staphylococcal arthritis, left knee: Secondary | ICD-10-CM

## 2017-05-15 MED ORDER — DOXYCYCLINE HYCLATE 100 MG PO CAPS: 100.0000 mg | ORAL_CAPSULE | Freq: Two times a day (BID) | ORAL | 0 refills | Status: AC

## 2017-05-15 NOTE — Progress Notes (Signed)
[facility]  Patient Active Problem List   Diagnosis Date Noted  . 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 Patella fracture 06/12/2014    Priority: Medium  . Painful orthopaedic hardware (HCC) 07/06/2015  . Bursitis 07/06/2015  . Osteomyelitis (HCC) 05/27/2015  . Unspecified constipation 06/20/2014  . Postoperative anemia due to acute blood loss 06/18/2014  . Iron deficiency anemia 06/18/2014  . Hyponatremia 06/16/2014  . Uncontrolled pain 06/16/2014    Patient's Medications  New Prescriptions   No medications on file  Previous Medications   IBUPROFEN-FAMOTIDINE (DUEXIS) 800-26.6 MG TABS    Take by mouth 3 (three) times daily.  Modified Medications   Modified Medication Previous Medication   DOXYCYCLINE (VIBRAMYCIN) 100 MG CAPSULE doxycycline (VIBRAMYCIN) 100 MG capsule      Take 1 capsule (100 mg total) by mouth 2 (two) times daily.    Take 1 capsule (100 mg total) by mouth 2 (two) times daily.  Discontinued Medications   No medications on file    Subjective: Brian Atkinson is in for his routine follow-up visit. He is feeling better. The draining sinus tract over his left knee quit draining about 1 week ago. He is having less pain and swelling but still notices some increased warmth of his left knee. He has been on doxycycline for a little over one month. He has had no problems tolerating it.  Review of Systems: Review of Systems  Constitutional: Negative for chills, diaphoresis and fever.  Gastrointestinal: Negative for abdominal pain, diarrhea, nausea and vomiting.  Musculoskeletal: Positive for joint pain.    Past Medical History:  Diagnosis Date  . Patella fracture    left     Social History  Substance Use Topics  . Smoking status: Never Smoker  . Smokeless tobacco: Never Used  . Alcohol use Yes     Comment: 05/27/2015 "might have a few drinks a few times/year"    No family history on  file.  No Known Allergies  Objective: Vitals:   05/15/17 1031  BP: 128/84  Pulse: (!) 139  Temp: 98.6 F (37 C)  TempSrc: Oral  Weight: 238 lb (108 kg)   Body mass index is 33.19 kg/m.  Physical Exam  Constitutional: He is oriented to person, place, and time.  He is in good spirits.  Musculoskeletal:  He has mild warmth and swelling of his left knee. There is a scabbed area over the lower portion of his previously healed incision. There is no cellulitis, drainage or fluctuance.  Neurological: He is alert and oriented to person, place, and time.  Skin: No rash noted.  Psychiatric: Mood and affect normal.    Lab Results Sed Rate (mm/hr)  Date Value  04/17/2017 9  05/27/2015 10  06/16/2014 97 (H)   CRP  Date Value  04/17/2017 1.7 mg/L  05/27/2015 <0.5 mg/dL  16/10/960409/05/2014 54.018.6 mg/dL (H)     Problem List Items Addressed This Visit      High   Septic joint of left knee joint (HCC)    He had a late recurrence of infection around his previously fractured left patella. It has improved with empiric doxycycline therapy suggesting that the infection was due to his previous MRSA infection. I discussed management options with him. He will complete 2 more weeks of therapy and then stop and see what happens. If this recurs again he may need surgery to look  for a nidus of bone infection or retained sutures. He will follow-up here in 6 weeks.          Cliffton Asters, MD [facility] [phone] pager   [phone] cell 05/15/2017, 11:00 AM

## 2017-05-15 NOTE — Assessment & Plan Note (Signed)
He had a late recurrence of infection around his previously fractured left patella. It has improved with empiric doxycycline therapy suggesting that the infection was due to his previous MRSA infection. I discussed management options with him. He will complete 2 more weeks of therapy and then stop and see what happens. If this recurs again he may need surgery to look for a nidus of bone infection or retained sutures. He will follow-up here in 6 weeks.

## 2017-06-28 ENCOUNTER — Encounter: Payer: Self-pay | Admitting: Internal Medicine

## 2017-06-28 ENCOUNTER — Ambulatory Visit (INDEPENDENT_AMBULATORY_CARE_PROVIDER_SITE_OTHER): Payer: Medicare Other | Admitting: Internal Medicine

## 2017-06-28 DIAGNOSIS — Z23 Encounter for immunization: Secondary | ICD-10-CM

## 2017-06-28 DIAGNOSIS — M00062 Staphylococcal arthritis, left knee: Secondary | ICD-10-CM

## 2017-06-28 MED ORDER — DOXYCYCLINE HYCLATE 100 MG PO TABS: 100.0000 mg | ORAL_TABLET | Freq: Two times a day (BID) | ORAL | 11 refills | Status: DC

## 2017-06-28 NOTE — Progress Notes (Signed)
         [facility]  Patient Active Problem List   Diagnosis Date Noted  . 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 Patella fracture 06/12/2014    Priority: Medium  . Painful orthopaedic hardware (HCC) 07/06/2015  . Bursitis 07/06/2015  . Osteomyelitis (HCC) 05/27/2015  . Unspecified constipation 06/20/2014  . Postoperative anemia due to acute blood loss 06/18/2014  . Iron deficiency anemia 06/18/2014  . Hyponatremia 06/16/2014  . Uncontrolled pain 06/16/2014    Patient's Medications  New Prescriptions   DOXYCYCLINE (VIBRA-TABS) 100 MG TABLET    Take 1 tablet (100 mg total) by mouth 2 (two) times daily.  Previous Medications   IBUPROFEN-FAMOTIDINE (DUEXIS) 800-26.6 MG TABS    Take by mouth 3 (three) times daily.  Modified Medications   No medications on file  Discontinued Medications   No medications on file    Subjective: Brian Atkinson is in for his routine follow-up visit. He stopped the doxycycline about one month ago and initially did very well but then started having spontaneous bleeding from the sinus tract at the inferior portion of his left knee incision about 5 days ago.  Review of Systems: Review of Systems  Constitutional: Negative for chills, diaphoresis and fever.  Gastrointestinal: Negative for abdominal pain, diarrhea, nausea and vomiting.  Musculoskeletal: Positive for joint pain.    Past Medical History:  Diagnosis Date  . Patella fracture    left     Social History  Substance Use Topics  . Smoking status: Never Smoker  . Smokeless tobacco: Never Used  . Alcohol use Yes     Comment: 05/27/2015 "might have a few drinks a few times/year"    No family history on file.  No Known Allergies  Objective: Vitals:   06/28/17 0839  BP: 126/86  Pulse: 67  Temp: 97.9 F (36.6 C)  TempSrc: Oral  Weight: 230 lb (104.3 kg)  Height: 5' 11.5" (1.816 m)   Body mass index is 31.63 kg/m.  Physical  Exam  Constitutional:  He is frustrated but otherwise in good spirits.  Musculoskeletal:  There is a small scabbed area at the inferolateral portion of his left knee incision. There is no drainage currently.    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 His drainage to stop completely while on a brief course of doxycycline and now has recurred about a month after stopping. 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 We discussed options and we believe the lesser evil is for him to go back on suppressive doxycycline until his schedule allows reevaluation by Dr. handy. [name]      Relevant Medications   doxycycline (VIBRA-TABS) 100 MG tablet       Brian Asters, MD [facility] [phone] pager   [phone] cell 06/28/2017, 8:50 AM

## 2017-06-28 NOTE — Assessment & Plan Note (Signed)
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 His drainage to stop completely while on a brief course of doxycycline and now has recurred about a month after stopping. 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 We discussed options and we believe the lesser evil is for him to go back on suppressive doxycycline until his schedule allows reevaluation by Dr. handy. [name]

## 2017-07-16 ENCOUNTER — Ambulatory Visit: Payer: Medicare Other | Admitting: Internal Medicine

## 2017-09-24 ENCOUNTER — Other Ambulatory Visit: Payer: Self-pay

## 2017-09-24 ENCOUNTER — Encounter (HOSPITAL_COMMUNITY): Payer: Self-pay | Admitting: *Deleted

## 2017-09-24 NOTE — H&P (Signed)
Orthopaedic Trauma Service (OTS) Consult   Patient ID: Brian Atkinson MRN: [id number] DOB/AGE: January 11, 1951 66 y.o.    HPI: Brian Atkinson is an 66 y.o. male who presents today for surgical intervention with respect to chronic draining wound from L knee. Pt has history of similar which was addressed with I&D and ROH, noted to be MRSA. Treated with course of IV and PO abx. Drainage has returned after a period of quiescence. Pt denies any new trauma. No recent illnesses. Pt does not actively feel ill.   Past Medical History:  Diagnosis Date  . Complication of anesthesia    post-op memory problems  . MRSA (methicillin resistant Staphylococcus aureus)    left knee  . Patella fracture    left     Past Surgical History:  Procedure Laterality Date  . CATARACT EXTRACTION W/ INTRAOCULAR LENS  IMPLANT, BILATERAL    . COLONOSCOPY    . DEBRIDEMENT AND CLOSURE WOUND Left 06/19/2014   Procedure: DEBRIDEMENT AND CLOSURE LEFT KNEE;  Surgeon: Kathryne Hitchhristopher Y Blackman, MD;  Location: [facility] ORS;  Service: Orthopedics;  Laterality: Left;  . IRRIGATION AND DEBRIDEMENT KNEE Left 06/17/2014   Procedure: IRRIGATION AND DEBRIDEMENT KNEE;  Surgeon: Budd PalmerMichael H Handy, MD;  Location: [facility] ORS;  Service: Orthopedics;  Laterality: Left;  . KNEE BURSECTOMY Left 05/27/2015  . KNEE BURSECTOMY Left 05/27/2015   Procedure: KNEE BURSECTOMY, suture removal patellar tendon;  Surgeon: Myrene GalasMichael Handy, MD;  [facility];  Service: Orthopedics;  Laterality: Left;  . ORIF PATELLA Left 06/12/2014   Procedure: OPEN REDUCTION INTERNAL (ORIF) FIXATION PATELLA;  Surgeon: Budd PalmerMichael H Handy, MD;  Location: [facility] OR;  Service: Orthopedics;  Laterality: Left;  . REFRACTIVE SURGERY Bilateral   . TONSILLECTOMY      History reviewed. No pertinent family history.  Social History:  reports that  has never smoked. he has never used smokeless tobacco. He reports that he drinks alcohol. He reports that he does not use drugs.  Allergies: No Known  Allergies  Medications:   Current Meds  Medication Sig  . doxycycline (VIBRA-TABS) 100 MG tablet Take 1 tablet (100 mg total) by mouth 2 (two) times daily.     No results found for this or any previous visit (from the past 48 hour(s)).  No results found.  Review of Systems  Constitutional: Negative for chills and fever.  Eyes: Negative for blurred vision.  Respiratory: Negative for shortness of breath and wheezing.   Cardiovascular: Negative for chest pain and palpitations.  Neurological: Negative for tingling and sensory change.    Vitals on arrival to short stay  Physical Exam  Constitutional: He appears well-developed and well-nourished.  HENT:  Head: Normocephalic and atraumatic.  Cardiovascular: Normal rate and regular rhythm.  Pulmonary/Chest: Effort normal and breath sounds normal.  Musculoskeletal:  Left Lower Extremity  Draining wound left knee Distal motor and sensory functions intact No evidence of septic joint + DP pulse No swelling distally  Skin: Skin is warm.   Dg Left knee    + lucency L patella from previous suture repair. ? Bacterial nidus    Assessment/Plan:  66 y/o with recurrent draining sinus L knee  - recurrent draining sinus L knee       OR for repeat I&D       Intra-op cultures       Likely dc home on PO abx unless intra-op findings dictate otherwise       No restrictions post op  WBAT post op   Mearl LatinKeith W. Lajarvis Italiano, PA-C [facility] [phone](P) [phone] (C) [phone] (O) 09/24/2017, 9:55 PM

## 2017-09-24 NOTE — Progress Notes (Signed)
Pt denies SOB, chest pain, and being under the care of a cardiologist. Pt denies having a stress test, echo and cardiac cath. Pt denies having an EKG and chest x ray within the last year. Pt made aware to stop taking  Aspirin, vitamins, fish oil, and herbal medications. Do not take any NSAIDs ie: Ibuprofen, Advil, Naproxen (Aleve), Motrin, BC and Goody Powder or any medication containing Aspirin.

## 2017-09-24 NOTE — Progress Notes (Signed)
Pt verbalized understanding of all pre-op instructions. 

## 2017-09-25 ENCOUNTER — Ambulatory Visit (HOSPITAL_COMMUNITY): Payer: Medicare Other | Admitting: Anesthesiology

## 2017-09-25 ENCOUNTER — Inpatient Hospital Stay (HOSPITAL_COMMUNITY)
Admission: RE | Admit: 2017-09-25 | Discharge: 2017-09-26 | DRG: 904 | Disposition: A | Discharge status: Home / Self Care | Payer: Medicare Other | Source: Ambulatory Visit | Attending: Orthopedic Surgery | Admitting: Orthopedic Surgery

## 2017-09-25 ENCOUNTER — Encounter (HOSPITAL_COMMUNITY)
Admission: RE | Disposition: A | Discharge status: Home / Self Care | Payer: Self-pay | Source: Ambulatory Visit | Attending: Orthopedic Surgery

## 2017-09-25 ENCOUNTER — Encounter (HOSPITAL_COMMUNITY): Payer: Self-pay | Admitting: Certified Registered Nurse Anesthetist

## 2017-09-25 ENCOUNTER — Ambulatory Visit (HOSPITAL_COMMUNITY): Payer: Medicare Other

## 2017-09-25 DIAGNOSIS — Z792 Long term (current) use of antibiotics: Secondary | ICD-10-CM | POA: Diagnosis not present

## 2017-09-25 DIAGNOSIS — M60262 Foreign body granuloma of soft tissue, not elsewhere classified, left lower leg: Secondary | ICD-10-CM | POA: Diagnosis present

## 2017-09-25 DIAGNOSIS — M8648 Chronic osteomyelitis with draining sinus, other site: Secondary | ICD-10-CM

## 2017-09-25 DIAGNOSIS — M8618 Other acute osteomyelitis, other site: Secondary | ICD-10-CM

## 2017-09-25 DIAGNOSIS — Z1889 Other specified retained foreign body fragments: Secondary | ICD-10-CM

## 2017-09-25 DIAGNOSIS — Z419 Encounter for procedure for purposes other than remedying health state, unspecified: Secondary | ICD-10-CM

## 2017-09-25 DIAGNOSIS — Z8614 Personal history of Methicillin resistant Staphylococcus aureus infection: Secondary | ICD-10-CM | POA: Diagnosis present

## 2017-09-25 DIAGNOSIS — M869 Osteomyelitis, unspecified: Secondary | ICD-10-CM | POA: Diagnosis present

## 2017-09-25 DIAGNOSIS — B9562 Methicillin resistant Staphylococcus aureus infection as the cause of diseases classified elsewhere: Secondary | ICD-10-CM | POA: Diagnosis present

## 2017-09-25 DIAGNOSIS — T8183XA Persistent postprocedural fistula, initial encounter: Principal | ICD-10-CM | POA: Diagnosis present

## 2017-09-25 HISTORY — PX: BONE EXCISION: SHX6730

## 2017-09-25 HISTORY — DX: Adverse effect of unspecified anesthetic, initial encounter: T41.45XA

## 2017-09-25 HISTORY — DX: Methicillin resistant Staphylococcus aureus infection, unspecified site: A49.02

## 2017-09-25 HISTORY — DX: Other complications of anesthesia, initial encounter: T88.59XA

## 2017-09-25 LAB — COMPREHENSIVE METABOLIC PANEL
ALBUMIN: 3.7 g/dL (ref 3.5–5.0)
ALT: 23 U/L (ref 17–63)
AST: 23 U/L (ref 15–41)
Alkaline Phosphatase: 70 U/L (ref 38–126)
Anion gap: 9 (ref 5–15)
BUN: 16 mg/dL (ref 6–20)
CHLORIDE: 106 mmol/L (ref 101–111)
CO2: 24 mmol/L (ref 22–32)
CREATININE: 0.95 mg/dL (ref 0.61–1.24)
Calcium: 9.1 mg/dL (ref 8.9–10.3)
GFR calc Af Amer: >60 mL/min (ref >60)
GFR calc non Af Amer: >60 mL/min (ref >60)
GLUCOSE: 102 mg/dL — AB (ref 65–99)
POTASSIUM: 4.1 mmol/L (ref 3.5–5.1)
SODIUM: 139 mmol/L (ref 135–145)
Total Bilirubin: 0.9 mg/dL (ref 0.3–1.2)
Total Protein: 6.9 g/dL (ref 6.5–8.1)

## 2017-09-25 LAB — PROTIME-INR
INR: 1.05
Prothrombin Time: 13.6 seconds (ref 11.4–15.2)

## 2017-09-25 LAB — CBC WITH DIFFERENTIAL/PLATELET
BASOS ABS: 0.1 10*3/uL (ref 0.0–0.1)
BASOS PCT: 1 %
Eosinophils Absolute: 0.1 10*3/uL (ref 0.0–0.7)
Eosinophils Relative: 1 %
HCT: 42.2 % (ref 39.0–52.0)
Hemoglobin: 14.3 g/dL (ref 13.0–17.0)
LYMPHS PCT: 22 %
Lymphs Abs: 2.3 10*3/uL (ref 0.7–4.0)
MCH: 30.6 pg (ref 26.0–34.0)
MCHC: 33.9 g/dL (ref 30.0–36.0)
MCV: 90.2 fL (ref 78.0–100.0)
Monocytes Absolute: 0.9 10*3/uL (ref 0.1–1.0)
Monocytes Relative: 9 %
Neutro Abs: 7.2 10*3/uL (ref 1.7–7.7)
Neutrophils Relative %: 67 %
Platelets: 233 10*3/uL (ref 150–400)
RBC: 4.68 MIL/uL (ref 4.22–5.81)
RDW: 13 % (ref 11.5–15.5)
WBC: 10.5 10*3/uL (ref 4.0–10.5)

## 2017-09-25 LAB — SEDIMENTATION RATE: SED RATE: 10 mm/h (ref 0–16)

## 2017-09-25 LAB — C-REACTIVE PROTEIN

## 2017-09-25 SURGERY — BONE EXCISION
Anesthesia: General | Laterality: Left

## 2017-09-25 MED ORDER — GENTAMICIN SULFATE 40 MG/ML IJ SOLN
INTRAMUSCULAR | Status: AC
  Filled 2017-09-25: qty 2

## 2017-09-25 MED ORDER — HYDROMORPHONE HCL 1 MG/ML IJ SOLN
INTRAMUSCULAR | Status: AC
  Filled 2017-09-25: qty 1

## 2017-09-25 MED ORDER — OXYCODONE HCL 5 MG PO TABS
5.0000 mg | ORAL_TABLET | Freq: Once | ORAL | Status: AC | PRN
  Administered 2017-09-25: 5 mg via ORAL

## 2017-09-25 MED ORDER — LACTATED RINGERS IV SOLN
INTRAVENOUS | Status: DC
  Administered 2017-09-25 (×3): via INTRAVENOUS

## 2017-09-25 MED ORDER — MEPERIDINE HCL 25 MG/ML IJ SOLN
6.2500 mg | INTRAMUSCULAR | Status: DC | PRN
Start: 2017-09-25 — End: 2017-09-25

## 2017-09-25 MED ORDER — POTASSIUM CHLORIDE IN NACL 20-0.9 MEQ/L-% IV SOLN
INTRAVENOUS | Status: DC
  Administered 2017-09-25: 19:00:00 via INTRAVENOUS
  Filled 2017-09-25: qty 1000

## 2017-09-25 MED ORDER — DICLOFENAC SODIUM 50 MG PO TBEC
50.0000 mg | DELAYED_RELEASE_TABLET | Freq: Two times a day (BID) | ORAL | Status: DC
  Administered 2017-09-25 – 2017-09-26 (×2): 50 mg via ORAL
  Filled 2017-09-25 (×3): qty 1

## 2017-09-25 MED ORDER — 0.9 % SODIUM CHLORIDE (POUR BTL) OPTIME
TOPICAL | Status: DC | PRN
  Administered 2017-09-25: 1000 mL

## 2017-09-25 MED ORDER — VANCOMYCIN HCL 1000 MG IV SOLR
INTRAVENOUS | Status: AC
  Filled 2017-09-25: qty 1000

## 2017-09-25 MED ORDER — METHOCARBAMOL 500 MG PO TABS
ORAL_TABLET | ORAL | Status: AC
  Filled 2017-09-25: qty 2

## 2017-09-25 MED ORDER — ONDANSETRON HCL 4 MG PO TABS: 4.0000 mg | ORAL_TABLET | Freq: Four times a day (QID) | ORAL | Status: DC | PRN

## 2017-09-25 MED ORDER — METHOCARBAMOL 1000 MG/10ML IJ SOLN
500.0000 mg | Freq: Four times a day (QID) | INTRAVENOUS | Status: DC
  Filled 2017-09-25 (×7): qty 5

## 2017-09-25 MED ORDER — PHENYLEPHRINE HCL 10 MG/ML IJ SOLN
INTRAMUSCULAR | Status: DC | PRN
  Administered 2017-09-25: 80 ug via INTRAVENOUS

## 2017-09-25 MED ORDER — ACETAMINOPHEN 650 MG RE SUPP: 650.0000 mg | RECTAL | Status: DC | PRN

## 2017-09-25 MED ORDER — ENOXAPARIN SODIUM 40 MG/0.4ML ~~LOC~~ SOLN
40.0000 mg | SUBCUTANEOUS | Status: DC
  Administered 2017-09-26: 40 mg via SUBCUTANEOUS
  Filled 2017-09-25: qty 0.4

## 2017-09-25 MED ORDER — HYDROMORPHONE HCL 1 MG/ML IJ SOLN
0.2500 mg | INTRAMUSCULAR | Status: DC | PRN
  Administered 2017-09-25 (×4): 0.5 mg via INTRAVENOUS

## 2017-09-25 MED ORDER — METHOCARBAMOL 750 MG PO TABS
750.0000 mg | ORAL_TABLET | Freq: Four times a day (QID) | ORAL | Status: DC
  Administered 2017-09-25 – 2017-09-26 (×6): 750 mg via ORAL
  Filled 2017-09-25 (×5): qty 1

## 2017-09-25 MED ORDER — VANCOMYCIN HCL 1000 MG IV SOLR
INTRAVENOUS | Status: DC | PRN
  Administered 2017-09-25: 1000 mg via TOPICAL

## 2017-09-25 MED ORDER — METOCLOPRAMIDE HCL 5 MG PO TABS: 5.0000 mg | ORAL_TABLET | Freq: Three times a day (TID) | ORAL | Status: DC | PRN

## 2017-09-25 MED ORDER — DOCUSATE SODIUM 100 MG PO CAPS
100.0000 mg | ORAL_CAPSULE | Freq: Two times a day (BID) | ORAL | Status: DC
  Administered 2017-09-25 – 2017-09-26 (×2): 100 mg via ORAL
  Filled 2017-09-25 (×2): qty 1

## 2017-09-25 MED ORDER — PROPOFOL 10 MG/ML IV BOLUS
INTRAVENOUS | Status: AC
  Filled 2017-09-25: qty 20

## 2017-09-25 MED ORDER — SODIUM CHLORIDE 0.9% FLUSH: 10.0000 mL | INTRAVENOUS | Status: DC | PRN

## 2017-09-25 MED ORDER — ONDANSETRON HCL 4 MG/2ML IJ SOLN
INTRAMUSCULAR | Status: DC | PRN
  Administered 2017-09-25: 4 mg via INTRAVENOUS

## 2017-09-25 MED ORDER — ACETAMINOPHEN 325 MG PO TABS: 650.0000 mg | ORAL_TABLET | ORAL | Status: DC | PRN

## 2017-09-25 MED ORDER — FENTANYL CITRATE (PF) 100 MCG/2ML IJ SOLN
INTRAMUSCULAR | Status: DC | PRN
  Administered 2017-09-25: 100 ug via INTRAVENOUS
  Administered 2017-09-25 (×3): 25 ug via INTRAVENOUS

## 2017-09-25 MED ORDER — ONDANSETRON HCL 4 MG/2ML IJ SOLN: 4.0000 mg | Freq: Four times a day (QID) | INTRAMUSCULAR | Status: DC | PRN

## 2017-09-25 MED ORDER — MIDAZOLAM HCL 2 MG/2ML IJ SOLN
INTRAMUSCULAR | Status: AC
  Filled 2017-09-25: qty 2

## 2017-09-25 MED ORDER — OXYCODONE HCL 5 MG PO TABS
5.0000 mg | ORAL_TABLET | ORAL | Status: DC | PRN
Start: 2017-09-25 — End: 2017-09-26
  Administered 2017-09-25 – 2017-09-26 (×3): 10 mg via ORAL
  Filled 2017-09-25 (×3): qty 2

## 2017-09-25 MED ORDER — CHLORHEXIDINE GLUCONATE 4 % EX LIQD: 60.0000 mL | Freq: Once | CUTANEOUS | Status: DC

## 2017-09-25 MED ORDER — ACETAMINOPHEN 500 MG PO TABS
500.0000 mg | ORAL_TABLET | Freq: Four times a day (QID) | ORAL | Status: DC
  Administered 2017-09-25 – 2017-09-26 (×5): 500 mg via ORAL
  Filled 2017-09-25 (×5): qty 1

## 2017-09-25 MED ORDER — SODIUM CHLORIDE 0.9 % IV SOLN
1500.0000 mg | INTRAVENOUS | Status: AC
  Administered 2017-09-25: 1500 mg via INTRAVENOUS
  Filled 2017-09-25: qty 1500

## 2017-09-25 MED ORDER — PROPOFOL 10 MG/ML IV BOLUS
INTRAVENOUS | Status: DC | PRN
  Administered 2017-09-25: 200 mg via INTRAVENOUS

## 2017-09-25 MED ORDER — OXYCODONE HCL 5 MG PO TABS
ORAL_TABLET | ORAL | Status: AC
  Filled 2017-09-25: qty 1

## 2017-09-25 MED ORDER — METOCLOPRAMIDE HCL 5 MG/ML IJ SOLN: 5.0000 mg | Freq: Three times a day (TID) | INTRAMUSCULAR | Status: DC | PRN

## 2017-09-25 MED ORDER — OXYCODONE HCL 5 MG/5ML PO SOLN: 5.0000 mg | Freq: Once | ORAL | Status: AC | PRN

## 2017-09-25 MED ORDER — LIDOCAINE HCL (CARDIAC) 20 MG/ML IV SOLN
INTRAVENOUS | Status: DC | PRN
  Administered 2017-09-25: 100 mg via INTRAVENOUS

## 2017-09-25 MED ORDER — HYDROMORPHONE HCL 1 MG/ML IJ SOLN
0.5000 mg | INTRAMUSCULAR | Status: DC | PRN
  Administered 2017-09-25 (×2): 1 mg via INTRAVENOUS
  Filled 2017-09-25 (×3): qty 1

## 2017-09-25 MED ORDER — VANCOMYCIN HCL IN DEXTROSE 1-5 GM/200ML-% IV SOLN
1000.0000 mg | Freq: Three times a day (TID) | INTRAVENOUS | Status: DC
  Administered 2017-09-26 (×2): 1000 mg via INTRAVENOUS
  Filled 2017-09-25 (×3): qty 200

## 2017-09-25 MED ORDER — GENTAMICIN SULFATE 40 MG/ML IJ SOLN
INTRAMUSCULAR | Status: DC | PRN
  Administered 2017-09-25 (×2): 80 mg

## 2017-09-25 MED ORDER — PROMETHAZINE HCL 25 MG/ML IJ SOLN: 6.2500 mg | INTRAMUSCULAR | Status: DC | PRN

## 2017-09-25 MED ORDER — FENTANYL CITRATE (PF) 250 MCG/5ML IJ SOLN
INTRAMUSCULAR | Status: AC
  Filled 2017-09-25: qty 5

## 2017-09-25 SURGICAL SUPPLY — 64 items
BANDAGE ACE 4X5 VEL STRL LF (GAUZE/BANDAGES/DRESSINGS) ×2 IMPLANT
BANDAGE ACE 6X5 VEL STRL LF (GAUZE/BANDAGES/DRESSINGS) ×2 IMPLANT
BLADE CLIPPER SURG (BLADE) ×1 IMPLANT
BLADE SURG 10 STRL SS (BLADE) ×1 IMPLANT
BNDG GAUZE ELAST 4 BULKY (GAUZE/BANDAGES/DRESSINGS) ×4 IMPLANT
BRUSH SCRUB SURG 4.25 DISP (MISCELLANEOUS) ×3 IMPLANT
CONT SPEC STER OR (MISCELLANEOUS) ×1 IMPLANT
COVER SURGICAL LIGHT HANDLE (MISCELLANEOUS) ×4 IMPLANT
CUFF TOURNIQUET SINGLE 34IN LL (TOURNIQUET CUFF) ×1 IMPLANT
CUFF TOURNIQUET SINGLE 44IN (TOURNIQUET CUFF) IMPLANT
DECANTER SPIKE VIAL GLASS SM (MISCELLANEOUS) IMPLANT
DRAPE C-ARM 42X72 X-RAY (DRAPES) ×2 IMPLANT
DRAPE C-ARMOR (DRAPES) ×2 IMPLANT
DRAPE INCISE IOBAN 85X60 (DRAPES) ×1 IMPLANT
DRSG ADAPTIC 3X8 NADH LF (GAUZE/BANDAGES/DRESSINGS) ×2 IMPLANT
DRSG EMULSION OIL 3X3 NADH (GAUZE/BANDAGES/DRESSINGS) ×2 IMPLANT
DRSG MEPITEL 4X7.2 (GAUZE/BANDAGES/DRESSINGS) ×1 IMPLANT
DRSG PAD ABDOMINAL 8X10 ST (GAUZE/BANDAGES/DRESSINGS) ×2 IMPLANT
ELECT REM PT RETURN 9FT ADLT (ELECTROSURGICAL) ×2
ELECTRODE REM PT RTRN 9FT ADLT (ELECTROSURGICAL) ×1 IMPLANT
GAUZE SPONGE 4X4 12PLY STRL LF (GAUZE/BANDAGES/DRESSINGS) ×1 IMPLANT
GLOVE BIO SURGEON STRL SZ7.5 (GLOVE) ×2 IMPLANT
GLOVE BIO SURGEON STRL SZ8 (GLOVE) ×2 IMPLANT
GLOVE BIOGEL PI IND STRL 7.5 (GLOVE) ×1 IMPLANT
GLOVE BIOGEL PI IND STRL 8 (GLOVE) ×1 IMPLANT
GLOVE BIOGEL PI INDICATOR 7.5 (GLOVE) ×1
GLOVE BIOGEL PI INDICATOR 8 (GLOVE) ×1
GOWN STRL REUS W/ TWL XL LVL3 (GOWN DISPOSABLE) ×1 IMPLANT
GOWN STRL REUS W/TWL 2XL LVL3 (GOWN DISPOSABLE) ×2 IMPLANT
GOWN STRL REUS W/TWL XL LVL3 (GOWN DISPOSABLE) ×8
KIT BASIN OR (CUSTOM PROCEDURE TRAY) ×2 IMPLANT
KIT ROOM TURNOVER OR (KITS) ×2 IMPLANT
KIT STIMULAN RAPID CURE  10CC (Orthopedic Implant) ×1 IMPLANT
KIT STIMULAN RAPID CURE 10CC (Orthopedic Implant) IMPLANT
MANIFOLD NEPTUNE II (INSTRUMENTS) ×2 IMPLANT
NEEDLE 22X1 1/2 (OR ONLY) (NEEDLE) IMPLANT
NS IRRIG 1000ML POUR BTL (IV SOLUTION) ×2 IMPLANT
PACK ORTHO EXTREMITY (CUSTOM PROCEDURE TRAY) ×2 IMPLANT
PAD ABD 8X10 STRL (GAUZE/BANDAGES/DRESSINGS) ×1 IMPLANT
PAD ARMBOARD 7.5X6 YLW CONV (MISCELLANEOUS) ×4 IMPLANT
PAD CAST 4YDX4 CTTN HI CHSV (CAST SUPPLIES) ×2 IMPLANT
PADDING CAST COTTON 4X4 STRL (CAST SUPPLIES) ×2
PASSER SUT SWANSON 36MM LOOP (INSTRUMENTS) IMPLANT
STAPLER VISISTAT 35W (STAPLE) ×2 IMPLANT
SUCTION FRAZIER HANDLE 10FR (MISCELLANEOUS)
SUCTION TUBE FRAZIER 10FR DISP (MISCELLANEOUS) ×1 IMPLANT
SUT ETHILON 2 0 PSLX (SUTURE) ×1 IMPLANT
SUT FIBERWIRE #2 38 T-5 BLUE (SUTURE)
SUT FIBERWIRE #5 38 CONV NDL (SUTURE)
SUT PDS AB 0 CT 36 (SUTURE) ×1 IMPLANT
SUT PDS AB 2-0 CT1 27 (SUTURE) ×1 IMPLANT
SUT VIC AB 0 CT1 27 (SUTURE)
SUT VIC AB 0 CT1 27XBRD ANBCTR (SUTURE) ×1 IMPLANT
SUT VIC AB 2-0 CT1 27 (SUTURE) ×2
SUT VIC AB 2-0 CT1 TAPERPNT 27 (SUTURE) ×2 IMPLANT
SUTURE FIBERWR #2 38 T-5 BLUE (SUTURE) IMPLANT
SUTURE FIBERWR #5 38 CONV NDL (SUTURE) IMPLANT
SYR CONTROL 10ML LL (SYRINGE) IMPLANT
TOWEL OR 17X24 6PK STRL BLUE (TOWEL DISPOSABLE) ×2 IMPLANT
TOWEL OR 17X26 10 PK STRL BLUE (TOWEL DISPOSABLE) ×4 IMPLANT
TUBE CONNECTING 12X1/4 (SUCTIONS) ×2 IMPLANT
UNDERPAD 30X30 (UNDERPADS AND DIAPERS) ×2 IMPLANT
WATER STERILE IRR 1000ML POUR (IV SOLUTION) ×2 IMPLANT
YANKAUER SUCT BULB TIP NO VENT (SUCTIONS) IMPLANT

## 2017-09-25 NOTE — Progress Notes (Signed)
Pharmacy Antibiotic Note  Elijah BirkVictor Sellen is a 66 y.o. male admitted on 09/25/2017 with osteomyelitis.  Pharmacy has been consulted for vancomycin dosing.  S/p partial excision of left patella this morning. Excision of chronic draining sinus with flap rearrangement. 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    Currently afebrile with wbc of 10.5. Preop vancomycin given this morning.   Plan: Vancomycin 1000 IV every 8 hours.  Goal trough 15-20 mcg/mL.  Height: 5\' 11"  (180.3 cm) Weight: 220 lb (99.8 kg) IBW/kg (Calculated) : 75.3  Temp (24hrs), Avg:97.8 F (36.6 C), Min:97.7 F (36.5 C), Max:97.8 F (36.6 C)  Recent Labs  Lab 09/25/17 0648  WBC 10.5  CREATININE 0.95    Estimated Creatinine Clearance: 93.3 mL/min (by C-G formula based on SCr of 0.95 mg/dL).    No Known Allergies  Thank you for allowing pharmacy to be a part of this patient's care.  Sheppard CoilFrank Wilson PharmD., BCPS Clinical Pharmacist 09/25/2017 3:07 PM

## 2017-09-25 NOTE — Progress Notes (Signed)
Peripherally Inserted Central Catheter/Midline Placement  The IV Nurse has discussed with the patient and/or persons authorized to consent for the patient, the purpose of this procedure and the potential benefits and risks involved with this procedure.  The benefits include less needle sticks, lab draws from the catheter, and the patient may be discharged home with the catheter. Risks include, but not limited to, infection, bleeding, blood clot (thrombus formation), and puncture of an artery; nerve damage and irregular heartbeat and possibility to perform a PICC exchange if needed/ordered by physician.  Alternatives to this procedure were also discussed.  Bard Power PICC patient education guide, fact sheet on infection prevention and patient information card has been provided to patient /or left at bedside.    PICC/Midline Placement Documentation  PICC / Midline Single Lumen 06/19/14 PICC Right Basilic 42 cm 2 cm (Active)     PICC / Midline Single Lumen 05/28/15 PICC Left Basilic 49 cm 3 cm (Active)     PICC Single Lumen 09/25/17 PICC Left Basilic 45 cm 0 cm (Active)  Indication for Insertion or Continuance of Line Home intravenous therapies (PICC only) 09/25/2017  6:00 PM  Exposed Catheter (cm) 1 cm 09/25/2017  6:00 PM  Site Assessment Clean;Dry;Intact 09/25/2017  6:00 PM  Line Status Flushed;Blood return noted 09/25/2017  6:00 PM  Dressing Type Transparent 09/25/2017  6:00 PM  Dressing Status Clean;Dry;Intact 09/25/2017  6:00 PM  Dressing Intervention New dressing 09/25/2017  6:00 PM  Dressing Change Due 10/02/17 09/25/2017  6:00 PM       Reginia FortsLumban, Tollie Canada Albarece 09/25/2017, 6:15 PM

## 2017-09-25 NOTE — Anesthesia Procedure Notes (Signed)
Procedure Name: LMA Insertion Date/Time: 09/25/2017 8:36 AM Performed by: Adryel Wortmann T, CRNA Pre-anesthesia Checklist: Patient identified, Emergency Drugs available, Suction available and Patient being monitored Patient Re-evaluated:Patient Re-evaluated prior to induction Oxygen Delivery Method: Circle system utilized Preoxygenation: Pre-oxygenation with 100% oxygen Induction Type: IV induction Ventilation: Mask ventilation without difficulty LMA: LMA inserted LMA Size: 4.5 Number of attempts: 1 Airway Equipment and Method: Patient positioned with wedge pillow Placement Confirmation: positive ETCO2 and breath sounds checked- equal and bilateral Tube secured with: Tape Dental Injury: Teeth and Oropharynx as per pre-operative assessment

## 2017-09-25 NOTE — Anesthesia Postprocedure Evaluation (Signed)
Anesthesia Post Note  Patient: Brian Atkinson  Procedure(s) Performed: PARTIAL EXCISION OF LEFT PATELLA (Left )     Patient location during evaluation: PACU Anesthesia Type: General Level of consciousness: awake and alert Pain management: pain level controlled Vital Signs Assessment: post-procedure vital signs reviewed and stable Respiratory status: spontaneous breathing, nonlabored ventilation and respiratory function stable Cardiovascular status: blood pressure returned to baseline and stable Postop Assessment: no apparent nausea or vomiting Anesthetic complications: no    Last Vitals:  Vitals:   09/25/17 1149 09/25/17 1154  BP: 130/73   Pulse:  72  Resp:  16  Temp:    SpO2:  98%    Last Pain:  Vitals:   09/25/17 1229  TempSrc:   PainSc: 9                  Lowella CurbWarren Ray Caprisha Bridgett

## 2017-09-25 NOTE — Transfer of Care (Signed)
Immediate Anesthesia Transfer of Care Note  Patient: Brian Atkinson  Procedure(s) Performed: PARTIAL EXCISION OF LEFT PATELLA (Left )  Patient Location: PACU  Anesthesia Type:General  Level of Consciousness: awake, alert  and oriented  Airway & Oxygen Therapy: Patient Spontanous Breathing and Patient connected to nasal cannula oxygen  Post-op Assessment: Report given to RN and Post -op Vital signs reviewed and stable  Post vital signs: Reviewed and stable  Last Vitals:  Vitals:   09/25/17 0627 09/25/17 1021  BP: 133/77   Pulse: 86   Resp: 18   Temp: 36.6 C 36.5 C  SpO2: 95%     Last Pain:  Vitals:   09/25/17 0627  TempSrc: Oral      Patients Stated Pain Goal: 3 (09/25/17 0629)  Complications: No apparent anesthesia complications

## 2017-09-25 NOTE — Brief Op Note (Signed)
09/25/2017  12:50 PM  PATIENT:  Brian Atkinson  66 y.o. male  PRE-OPERATIVE DIAGNOSIS:   1. 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 SUSPECTED RETAINED SUTURE  POST-OPERATIVE DIAGNOSIS:   1. LEFT PATELLAR OSTEOMYELITIS, MRSA 2. RETAINED SUTURE  PROCEDURE:  Procedure(s): 1. PARTIAL EXCISION OF LEFT PATELLA (Left) 2. EXCISION OF CHRONIC DRAINING SINUS WITH LOCAL FLAP REARRANGEMENT 3. REMOVAL FOREIGN BODY 4. PLACEMENT OF ANTIBIOTIC BEADS  SURGEON:  Surgeon(s) and Role:    * Myrene GalasHandy, Canuto Kingston, MD - Primary  PHYSICIAN ASSISTANT: PA Student  ANESTHESIA:   general  EBL:  25 mL   BLOOD ADMINISTERED:none  DRAINS: none   LOCAL MEDICATIONS USED:  NONE  SPECIMEN:  Source of Specimen:  deep bone and suture  DISPOSITION OF SPECIMEN:  micro  COUNTS:  YES  TOURNIQUET:   Total Tourniquet Time Documented: Thigh (Left) - 62 minutes Total: Thigh (Left) - 62 minutes   DICTATION: .Other Dictation: Dictation Number [id number]  PLAN OF CARE: Admit to inpatient   PATIENT DISPOSITION:  PACU - hemodynamically stable.   Delay start of Pharmacological VTE agent (>24hrs) due to surgical blood loss or risk of bleeding: no

## 2017-09-25 NOTE — Anesthesia Preprocedure Evaluation (Signed)
Anesthesia Evaluation  Patient identified by MRN, date of birth, ID band Patient awake    Reviewed: Allergy & Precautions, NPO status , Patient's Chart, lab work & pertinent test results  Airway Mallampati: II  TM Distance: >3 FB Neck ROM: Full    Dental   Pulmonary neg pulmonary ROS,    breath sounds clear to auscultation       Cardiovascular negative cardio ROS   Rhythm:Regular Rate:Normal     Neuro/Psych    GI/Hepatic negative GI ROS, Neg liver ROS,   Endo/Other  negative endocrine ROS  Renal/GU negative Renal ROS     Musculoskeletal  (+) Arthritis ,   Abdominal   Peds  Hematology   Anesthesia Other Findings   Reproductive/Obstetrics                             Anesthesia Physical  Anesthesia Plan  ASA: II  Anesthesia Plan: General   Post-op Pain Management:    Induction: Intravenous  PONV Risk Score and Plan: 2 and Ondansetron and Midazolam  Airway Management Planned: LMA  Additional Equipment:   Intra-op Plan:   Post-operative Plan: Extubation in OR  Informed Consent: I have reviewed the patients History and Physical, chart, labs and discussed the procedure including the risks, benefits and alternatives for the proposed anesthesia with the patient or authorized representative who has indicated his/her understanding and acceptance.   Dental advisory given  Plan Discussed with: CRNA and Anesthesiologist  Anesthesia Plan Comments:         Anesthesia Quick Evaluation

## 2017-09-26 ENCOUNTER — Encounter (HOSPITAL_COMMUNITY): Payer: Self-pay | Admitting: Orthopedic Surgery

## 2017-09-26 DIAGNOSIS — M8618 Other acute osteomyelitis, other site: Secondary | ICD-10-CM

## 2017-09-26 DIAGNOSIS — B9562 Methicillin resistant Staphylococcus aureus infection as the cause of diseases classified elsewhere: Secondary | ICD-10-CM | POA: Diagnosis not present

## 2017-09-26 DIAGNOSIS — M8648 Chronic osteomyelitis with draining sinus, other site: Secondary | ICD-10-CM | POA: Diagnosis not present

## 2017-09-26 DIAGNOSIS — Z8614 Personal history of Methicillin resistant Staphylococcus aureus infection: Secondary | ICD-10-CM | POA: Diagnosis present

## 2017-09-26 DIAGNOSIS — Z95828 Presence of other vascular implants and grafts: Secondary | ICD-10-CM

## 2017-09-26 DIAGNOSIS — Z9889 Other specified postprocedural states: Secondary | ICD-10-CM | POA: Diagnosis not present

## 2017-09-26 DIAGNOSIS — S82002S Unspecified fracture of left patella, sequela: Secondary | ICD-10-CM

## 2017-09-26 DIAGNOSIS — A4902 Methicillin resistant Staphylococcus aureus infection, unspecified site: Secondary | ICD-10-CM | POA: Insufficient documentation

## 2017-09-26 DIAGNOSIS — T8183XA Persistent postprocedural fistula, initial encounter: Secondary | ICD-10-CM | POA: Diagnosis not present

## 2017-09-26 MED ORDER — VANCOMYCIN IV (FOR PTA / DISCHARGE USE ONLY): 1500.0000 mg | Freq: Two times a day (BID) | INTRAVENOUS | 0 refills | Status: DC

## 2017-09-26 MED ORDER — OXYCODONE-ACETAMINOPHEN 5-325 MG PO TABS
1.0000 | ORAL_TABLET | Freq: Three times a day (TID) | ORAL | 0 refills | Status: DC | PRN
Start: 2017-09-26 — End: 2020-07-28

## 2017-09-26 MED ORDER — HEPARIN SOD (PORK) LOCK FLUSH 100 UNIT/ML IV SOLN
250.0000 [IU] | INTRAVENOUS | Status: AC | PRN
  Administered 2017-09-26: 250 [IU]

## 2017-09-26 MED ORDER — SODIUM CHLORIDE 0.9 % IV SOLN
1500.0000 mg | Freq: Two times a day (BID) | INTRAVENOUS | Status: DC
  Administered 2017-09-26: 1500 mg via INTRAVENOUS
  Filled 2017-09-26 (×2): qty 1500

## 2017-09-26 MED ORDER — DICLOFENAC SODIUM 50 MG PO TBEC: 50.0000 mg | DELAYED_RELEASE_TABLET | Freq: Two times a day (BID) | ORAL | 0 refills | Status: AC

## 2017-09-26 MED ORDER — ACETAMINOPHEN 500 MG PO TABS: 500.0000 mg | ORAL_TABLET | Freq: Four times a day (QID) | ORAL | 0 refills | Status: DC | PRN

## 2017-09-26 MED ORDER — DOCUSATE SODIUM 100 MG PO CAPS: 100.0000 mg | ORAL_CAPSULE | Freq: Two times a day (BID) | ORAL | 0 refills | Status: DC

## 2017-09-26 MED ORDER — VANCOMYCIN HCL 10 G IV SOLR
1500.0000 mg | Freq: Two times a day (BID) | INTRAVENOUS | Status: DC
  Filled 2017-09-26: qty 1500

## 2017-09-26 MED ORDER — METHOCARBAMOL 750 MG PO TABS: 750.0000 mg | ORAL_TABLET | Freq: Three times a day (TID) | ORAL | 1 refills | Status: DC | PRN

## 2017-09-26 MED ORDER — ASPIRIN EC 325 MG PO TBEC: 325.0000 mg | DELAYED_RELEASE_TABLET | Freq: Every day | ORAL | 0 refills | Status: AC

## 2017-09-26 NOTE — Evaluation (Signed)
Physical Therapy Evaluation/Discharge Patient Details Name: Brian Atkinson MRN: [id number] DOB: Jan 18, 1951 Today's Date: 09/26/2017   History of Present Illness  Pt is a 66 y.o. male s/p partial excision of L patella. PMH significant for MRSA, L patella fracture, and multiple L knee procedures.   Clinical Impression  Pt is safe to d/c home with his wife's assist and intermittent supervision.  Pt is safe with RW use and HEP program issued.  Pt has all needed equipment at home. PT to sign off. Physician will decide if he needs f/u therapy at follow up appointments.     Follow Up Recommendations DC plan and follow up therapy as arranged by surgeon;Supervision - Intermittent    Equipment Recommendations  None recommended by PT    Recommendations for Other Services   NA    Precautions / Restrictions Precautions Precautions: Fall;Knee Precaution Booklet Issued: Yes (comment) Precaution Comments: knee exercise handout given, knee precaution reviewed Restrictions Weight Bearing Restrictions: Yes LLE Weight Bearing: Weight bearing as tolerated Other Position/Activity Restrictions: ROM to tolerance, gently per pt      Mobility  Bed Mobility Overal bed mobility: Needs Assistance Bed Mobility: Supine to Sit;Sit to Supine     Supine to sit: Supervision Sit to supine: Supervision   General bed mobility comments: Supervision for safety.   Transfers Overall transfer level: Needs assistance Equipment used: Rolling walker (2 wheeled) Transfers: Sit to/from Stand Sit to Stand: Supervision         General transfer comment: Supervision for safety.   Ambulation/Gait Ambulation/Gait assistance: Supervision Ambulation Distance (Feet): 200 Feet Assistive device: Rolling walker (2 wheeled) Gait Pattern/deviations: Step-through pattern;Antalgic Gait velocity: decreased Gait velocity interpretation: Below normal speed for age/gender General Gait Details: supervision for safety, safe RW  use, moderately antalgic gait pattern, no reports of lightheadedness.       Balance Overall balance assessment: No apparent balance deficits (not formally assessed)                                           Pertinent Vitals/Pain Pain Assessment: Faces Faces Pain Scale: Hurts even more Pain Location: L knee with walking and gentle exercise.  Pain Descriptors / Indicators: Aching Pain Intervention(s): Limited activity within patient's tolerance;Monitored during session;Repositioned    Home Living Family/patient expects to be discharged to:: Private residence Living Arrangements: Spouse/significant other Available Help at Discharge: Family;Available 24 hours/day Type of Home: House Home Access: Level entry     Home Layout: Able to live on main level with bedroom/bathroom Home Equipment: Walker - 2 wheels;Cane - single point;Crutches      Prior Function Level of Independence: Independent               Hand Dominance   Dominant Hand: Right    Extremity/Trunk Assessment   Upper Extremity Assessment Upper Extremity Assessment: Defer to OT evaluation    Lower Extremity Assessment Lower Extremity Assessment: LLE deficits/detail LLE Deficits / Details: left leg with normal post op pain and weakness.  AAROM in supine ~ 12-40 degrees of flexion.  Pt ankle at least 3/5, knee is 2/5, hip flexion 2/5    Cervical / Trunk Assessment Cervical / Trunk Assessment: Normal  Communication      Cognition Arousal/Alertness: Awake/alert Behavior During Therapy: WFL for tasks assessed/performed Overall Cognitive Status: Within Functional Limits for tasks assessed  Exercises Total Joint Exercises Ankle Circles/Pumps: AROM;Both;20 reps Quad Sets: AROM;Left;10 reps Towel Squeeze: AROM;Both;10 reps Heel Slides: AAROM;Left;10 reps(gently) Hip ABduction/ADduction: AAROM;Left;10 reps Straight Leg Raises:  AAROM;Left;10 reps   Assessment/Plan    PT Assessment Patent does not need any further PT services  PT Problem List             PT Goals (Current goals can be found in the Care Plan section)  Acute Rehab PT Goals Patient Stated Goal: go home ASAP Potential to Achieve Goals: Fair               AM-PAC PT "6 Clicks" Daily Activity  Outcome Measure Difficulty turning over in bed (including adjusting bedclothes, sheets and blankets)?: None Difficulty moving from lying on back to sitting on the side of the bed? : None Difficulty sitting down on and standing up from a chair with arms (e.g., wheelchair, bedside commode, etc,.)?: None Help needed moving to and from a bed to chair (including a wheelchair)?: None Help needed walking in hospital room?: None Help needed climbing 3-5 steps with a railing? : A Little 6 Click Score: 23    End of Session   Activity Tolerance: Patient limited by fatigue;Patient limited by pain Patient left: in bed;with call bell/phone within reach;with family/visitor present Nurse Communication: Mobility status PT Visit Diagnosis: Muscle weakness (generalized) (M62.81);Difficulty in walking, not elsewhere classified (R26.2);Pain Pain - Right/Left: Left Pain - part of body: Knee    Time: [id number] PT Time Calculation (min) (ACUTE ONLY): 16 min   Charges:       Lurena Joinerebecca B. Edra Riccardi, PT, DPT [phone]    PT Evaluation $PT Eval Moderate Complexity: 1 Mod     09/26/2017, 3:50 PM

## 2017-09-26 NOTE — Op Note (Signed)
NAME:  Brian Atkinson, Brian Atkinson                     ACCOUNT NO.:  MEDICAL RECORD NO.:  [id number]  LOCATION:                                 FACILITY:  PHYSICIAN:  Doralee AlbinoMichael H. Carola FrostHandy, M.D. DATE OF BIRTH:  04/14/1951  DATE OF PROCEDURE:  09/25/2017 DATE OF DISCHARGE:                              OPERATIVE REPORT   PREOPERATIVE DIAGNOSES: 1. 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 2. Suspected retained suture.  POSTOPERATIVE DIAGNOSES: 1. 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 2. Retained suture.  PROCEDURES: 1. Partial excision of left patella osteomyelitis with removal foreign body. 2. Excision of chronic draining sinus with local flap soft tissue     rearrangement for closure. 3. Placement of antibiotic beads.  SURGEON:  Doralee AlbinoMichael H. Carola FrostHandy, MD.  ASSISTANT:  PA student.  ANESTHESIA:  General.  ESTIMATED BLOOD LOSS:  25 mL.  TOURNIQUET:  62 minutes.  DISPOSITION:  To PACU.  CONDITION:  Stable.  BRIEF SUMMARY OF INDICATION FOR PROCEDURE:  Brian Atkinson is a very pleasant 66 year old male who sustained an open patella fracture, treated with ORIF several years ago.  The patient subsequently underwent removal of hardware and has continued to have difficulty with persistent and recurring MRSA, osteomyelitis.  The patient did undergo a prolonged antibiotic course under the care of the Infectious Disease physician, Dr. Cliffton AstersJohn Campbell, but again has had recurrence.  He now presents for home excision of the draining sinus, partial excision of his patellar osteomyelitis, exploration for retained suture and placement of antibiotic beads.  I did discuss with him risks and benefits of surgery including potential for persistent infection, need for further surgery, DVT, PE, loss of motion, arthritis, and multiple others.  He strongly wished to proceed.  BRIEF  SUMMARY OF PROCEDURE:  The patient was taken to the operating room where general anesthesia was induced.  His left lower extremity was prepped and draped in usual sterile fashion.  I did remove the scab over the draining sinus prior to this prep.  I then performed a wide marginal excision of this area removing a 2 cm x 4 cm area of tissue from the epidermis down to the fascia around the sinus tract and then going full thickness down through the fascia and deep tissues and retinaculum all the way to the patella with regard to the more narrow area of the sinus tract, completely removing all of its contents.  This was sent down to Pathology.  I then performed an aggressive curettage of the patella including a large cavity within it.  I did send some of this material including bone specimens for micro.  I continued the debridement and then chose to extend the deep exposure to search for the suture.  Using a curette, I was able to discover this, mobilized it and then completely removed the suture in bulk, which did have a large knot and was presumed to be the nidus.  The patella had walled off quite effectively and instead the cavity was well defined except for just a few small areas with regard to the area  of osteomyelitis.  Using a series of curette, I was able to debride this entire margin.  I irrigated it thoroughly with gentle bulb syringe lavage and then placed 10 mL of vancomycin and gentamicin antibiotic beads.  The local flap was then mobilized, created, and sutured using a series of PDS for the deep closure around the retinaculum with 0 suture, 2-0 for the subcutaneous area and then a retention mattress-type sutures with vertical technique and Allgower- Donati for the flap.  The area was irrigated and cleaned thoroughly and a layered dressing applied with Mepitel gauze and the ABD and then a sterile dressing using Ace wraps from foot to thigh.  The patient was taken to PACU in stable.   I did have an assistant helping me throughout. Final tourniquet time was 60 minutes.  PROGNOSIS:  Brian Atkinson will be weightbearing as tolerated with gentle range of motion of the knee.  Infectious Disease will be notified and PICC line will be placed for at least 4 weeks and likely longer of IV vancomycin.  We will await cultures.  We are optimistic that finding this suture and removing the sinus tract will finally resolve his infection along with the antibiotic beads.     Doralee AlbinoMichael H. Carola FrostHandy, M.D.     MHH/MEDQ  D:  09/25/2017  T:  09/25/2017  Job:  [id number]

## 2017-09-26 NOTE — Consult Note (Signed)
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 Completed previous course of IV antibiotics and on suppressive doxycycline with continued recurrence of infection. Now s/p I&D with placement of antibiotic beads and debridement of osteomyelitis. Cultures and pathology are pending. Current receiving vancomycin.   Patellar Osteomyelitis / MRSA infection - POD 1 without fevers or leukocytosis. Tolerating antibiotics without adverse reactions. Kidney function is normal. PICC line in place. Plan to continue vancomycin IV for 6 weeks. Plan to follow up in ID clinic in 1 month. OPAT orders already placed.   Diagnosis: MRSA Infection / osteomyelitis  Culture Result: Pending - previous MRSA  No Known Allergies  OPAT Orders Discharge antibiotics: Vancomycin Per pharmacy protocol  Aim for Vancomycin trough 15-20 (unless otherwise indicated) Duration: 6 weeks End Date: 11/07/17  [id number] [facility] Care Per Protocol:  Labs weekly while on IV antibiotics: _X_ CBC with differential _X_ BMP (bi-weekly) __ CMP _X_ CRP _X_ ESR _X_ Vancomycin trough  _X_ Please pull PIC at completion of IV antibiotics __ Please leave PIC in place until doctor has seen patient or been notified  Fax weekly labs to [phone]  Clinic Follow Up Appt: 1 month  with Dr. Megan Salon    Principal Problem:   Osteomyelitis [facility]) Active Problems:   History of MRSA infection left patella    . acetaminophen  500 mg Oral Q6H  . chlorhexidine  60 mL Topical Once  . diclofenac  50 mg Oral BID  . docusate sodium  100 mg Oral BID  . enoxaparin (LOVENOX) injection  40 mg Subcutaneous Q24H  . methocarbamol  750 mg Oral QID     HPI: Brian Atkinson is a 66 y.o. male with a PMH of patella fracture and MRSA infection of the left knee with presenting for surgical intervention from a chronic draining wound located on his left knee.   In 2015 he experienced an open patella fracture that was surgically treated with ORIF. Underwent a removal of hardware and has had recurring osteomyelitis and MRSA infection despite prolonged course of antibiotics. Last seen in the ID Office on 9/20 for routine follow up and found to have spontaneous bleeding from the sinus tract at the inferior portion of his left knee incision. At the time he was off antibiotics for about 1 month. 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  He was taken to the OR on 12/19 for a partial excision of the left patella osteomyelitis with excision of a chronic draining sinus with local flap with placement of antibiotic beads. The sinus tract was removed and sent to pathology with bone fragments sent for evaluation to micro. A suture, presumed to be the nidus was located and noted to have a large knot around it was completely removed. The entire margins of the areas of osteomyelitis were debrided and antibiotic beads were placed. ID has been asked to continue with antibiotic recommendations.   Eager to go home. PICC line has been placed. Mild soreness of the left knee. Afebrile with no leukocytosis.   Review of Systems: Review of Systems  Constitutional: Negative for chills and fever.  Respiratory: Negative for cough, shortness of breath and wheezing.   Cardiovascular: Negative for chest pain and leg swelling.  Gastrointestinal: Negative for abdominal pain, nausea and vomiting.  Musculoskeletal:       Positive for left knee soreness  Skin: Negative for rash.  Neurological: Negative for weakness.     Past Medical History:  Diagnosis Date  . Complication of anesthesia    post-op memory problems  . MRSA (methicillin  resistant Staphylococcus aureus)    left knee  . Patella fracture    left     Social History   Tobacco Use  . Smoking status: Never Smoker  . Smokeless tobacco: Never Used  Substance Use Topics  . Alcohol use: Yes    Comment: rare  . Drug use: No    History reviewed. No pertinent family history.  No Known Allergies  OBJECTIVE: Blood pressure 111/69, pulse 73, temperature 98.6 F (37 C), temperature source Oral, resp. rate 18, height '5\' 11"'$  (1.803 m), weight 220 lb (99.8 kg), SpO2 98 %.  Physical Exam  Constitutional: He is oriented to person, place, and time and well-developed, well-nourished, and in no distress. No distress.  Lying in bed; pleasant and in good spirits.   Cardiovascular: Normal rate, regular rhythm, normal heart sounds and intact distal pulses. Exam reveals no gallop and no friction rub.  No murmur heard. Pulmonary/Chest: Effort normal and breath sounds normal. No respiratory distress. He has no wheezes. He has no rales. He exhibits no tenderness.  Musculoskeletal:  Left knee - Post surgical bandage in place that is clean, dry and intact. Currently icing. Mild tenderness. Distal pulses and sensation are intact and appropriate.   Neurological: He is alert and oriented to person, place, and time.  Skin: Skin is warm and dry.  Psychiatric: Affect normal.    Lab Results Lab Results  Component Value Date   WBC 10.5 09/25/2017   HGB 14.3 09/25/2017   HCT 42.2 09/25/2017   MCV 90.2 09/25/2017   PLT 233 09/25/2017    Lab Results  Component Value Date   CREATININE 0.95 09/25/2017   BUN 16 09/25/2017   NA 139 09/25/2017   K 4.1 09/25/2017   CL 106 09/25/2017   CO2 24 09/25/2017    Lab Results  Component Value Date   ALT 23 09/25/2017   AST 23 09/25/2017   ALKPHOS 70 09/25/2017   BILITOT 0.9 09/25/2017     Microbiology: Recent Results (from the past 240 hour(s))  Aerobic/Anaerobic Culture (surgical/deep wound)     Status: None (Preliminary  result)   Collection Time: 09/25/17  9:51 AM  Result Value Ref Range Status   Specimen Description WOUND LEFT LEG  Final   Special Requests OSTEOMYLITIS LEFT PATELLA Superior A ON SWABS  Final   Gram Stain   Final    RARE WBC PRESENT, PREDOMINANTLY PMN NO ORGANISMS SEEN    Culture PENDING  Incomplete   Report Status PENDING  Incomplete     Terri Piedra, NP [facility] [phone] Pager  09/26/2017  8:44 AM

## 2017-09-26 NOTE — Discharge Summary (Signed)
Orthopaedic Trauma Service (OTS)  Patient ID: Brian Atkinson MRN: [id number] DOB/AGE: 01/14/1951 66 y.o.  Admit date: 09/25/2017 Discharge date: 09/26/2017  Admission Diagnoses: Recurrent osteomyelitis left patella History of MRSA infection  Discharge Diagnoses:  Principal Problem:   Osteomyelitis [facility]) Active Problems:   History of MRSA infection left patella    Procedures Performed: 09/25/2017-Brian Atkinson 1. Partial excision of left patella osteomyelitis with removal foreign body. 2. Excision of chronic draining sinus with local flap soft tissue     rearrangement for closure. 3. Placement of antibiotic beads.   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  Patient's last procedure was back in 2016 where extensive I&D was performed along with removal of FiberWire suture.  Patient had resolution of his symptoms however he began to develop new symptoms several weeks ago with a draining sinus.  Patient was taken back to the OR on the day noted above.  After extensive debridement a large piece of FiberWire knot was found within the patella and this was removed.  Antibiotic beads were placed as well.  Sinus tract was debrided and removed.  Patient tolerated procedure well.  Specimens were sent down to the lab for culture.  After surgery patient was transferred to the PACU for recovery from anesthesia and then transferred to the orthopedic floor for observation, pain control and IV antibiotic therapy.  Patient did receive his PICC line on postop day 0.  On postop day #1 patient was doing very well.  He was seen and evaluated by the infectious disease service and will follow up with Brian Atkinson.  He was continued on the vancomycin regimen given his history of MRSA in the past.  At the time of discharge his cultures are not final but no organisms were seen on Gram  stain.  Patient discharged in stable condition.  Pain is well controlled.  Patient mobilizing well.  Home health RN was arranged to facilitate home IV antibiotics.  Routine labs will be obtained as well and forwarded to ID as well as our office.  Consults: ID  Significant Diagnostic Studies: labs:  Results for Brian, Atkinson (MRN [id number]) as of 10/04/2017 09:48  Ref. Range 09/25/2017 06:48  Sodium Latest Ref Range: 135 - 145 mmol/L 139  Potassium Latest Ref Range: 3.5 - 5.1 mmol/L 4.1  Chloride Latest Ref Range: 101 - 111 mmol/L 106  CO2 Latest Ref Range: 22 - 32 mmol/L 24  Glucose Latest Ref Range: 65 - 99 mg/dL 102 (H)  BUN Latest Ref Range: 6 - 20 mg/dL 16  Creatinine Latest Ref Range: 0.61 - 1.24 mg/dL 0.95  Calcium Latest Ref Range: 8.9 - 10.3 mg/dL 9.1  Anion gap Latest Ref Range: 5 - 15  9  Alkaline Phosphatase Latest Ref Range: 38 - 126 U/L 70  Albumin Latest Ref Range: 3.5 - 5.0 g/dL 3.7  AST Latest Ref Range: 15 - 41 U/L 23  ALT Latest Ref Range: 17 - 63 U/L 23  Total Protein Latest Ref Range: 6.5 - 8.1 g/dL 6.9  Total Bilirubin Latest Ref Range: 0.3 - 1.2 mg/dL 0.9  GFR, Est Non African American Latest Ref Range: >60 mL/min >60  GFR, Est African American Latest Ref Range: >60 mL/min >60  CRP Latest Ref Range: <1.0 mg/dL <0.8  WBC Latest Ref Range: 4.0 - 10.5 K/uL 10.5  RBC Latest Ref Range: 4.22 - 5.81 MIL/uL 4.68  Hemoglobin  Latest Ref Range: 13.0 - 17.0 g/dL 14.3  HCT Latest Ref Range: 39.0 - 52.0 % 42.2  MCV Latest Ref Range: 78.0 - 100.0 fL 90.2  MCH Latest Ref Range: 26.0 - 34.0 pg 30.6  MCHC Latest Ref Range: 30.0 - 36.0 g/dL 33.9  RDW Latest Ref Range: 11.5 - 15.5 % 13.0  Platelets Latest Ref Range: 150 - 400 K/uL 233  Neutrophils Latest Units: % 67  Lymphocytes Latest Units: % 22  Monocytes Relative Latest Units: % 9  Eosinophil Latest Units: % 1  Basophil Latest Units: % 1  NEUT# Latest Ref Range: 1.7 - 7.7 K/uL 7.2  Lymphocyte # Latest Ref Range: 0.7 -  4.0 K/uL 2.3  Monocyte # Latest Ref Range: 0.1 - 1.0 K/uL 0.9  Eosinophils Absolute Latest Ref Range: 0.0 - 0.7 K/uL 0.1  Basophils Absolute Latest Ref Range: 0.0 - 0.1 K/uL 0.1  Sed Rate Latest Ref Range: 0 - 16 mm/hr 10  Prothrombin Time Latest Ref Range: 11.4 - 15.2 seconds 13.6  INR Unknown 1.05     Treatments: IV hydration, antibiotics: vancomycin, analgesia: Dilaudid, OxyIR, diclofenac, anticoagulation: LMW heparin, therapies: PT, OT and RN and surgery: As above  Discharge Exam:  Orthopaedic Trauma Service (OTS)   1 Day Post-Op Procedure(s) (LRB): PARTIAL EXCISION OF LEFT PATELLA (Left)   Subjective: Patient reports pain as moderate.  Eager for discharge.   Objective: Current Vitals Blood pressure 111/69, pulse 73, temperature 98.6 F (37 C), temperature source Oral, resp. rate 18, height 5' 11" (1.803 m), weight 99.8 kg (220 lb), SpO2 98 %. Vital signs in last 24 hours: Temp:  [97.7 F (36.5 C)-98.6 F (37 C)] 98.6 F (37 C) (12/19 0617) Pulse Rate:  [69-93] 73 (12/19 0617) Resp:  [9-18] 18 (12/19 0617) BP: (102-130)/(61-80) 111/69 (12/19 0617) SpO2:  [95 %-100 %] 98 % (12/19 0617)   Intake/Output from previous day: 12/18 0701 - 12/19 0700 In: 3091.3 [P.O.:800; I.V.:2191.3; IV Piggyback:100] Out: 25 [Blood:25]   LABS Recent Labs (last 2 labs)      Recent Labs    09/25/17 0648  HGB 14.3      Recent Labs (last 2 labs)      Recent Labs    09/25/17 0648  WBC 10.5  RBC 4.68  HCT 42.2  PLT 233      Recent Labs (last 2 labs)      Recent Labs    09/25/17 0648  NA 139  K 4.1  CL 106  CO2 24  BUN 16  CREATININE 0.95  GLUCOSE 102*  CALCIUM 9.1      Recent Labs (last 2 labs)      Recent Labs    09/25/17 0648  INR 1.05      Multiple specimens including suture knot sent to micro; C&S pending   Physical Exam LLE       Dressing intact, clean, dry             Edema/ swelling controlled             Sens: DPN, SPN, TN intact              Motor: EHL, FHL, and lessor toe ext and flex all intact grossly             Brisk cap refill, warm to touch   Assessment/Plan: 1 Day Post-Op Procedure(s) (LRB): PARTIAL EXCISION OF LEFT PATELLA (Left) 1. PT/OT WBAT with knee motion as pain allows 2. DVT proph Lovenox  then ECASA post d/c 3. Dr. Tommy Medal of ID to see today prior to d/c; patient normally sees Brian Atkinson 4. F/u 2 wks; may remove dressing and shower in 2 more days   Altamese Milford, MD [facility], [facility] [phone] (p)     Disposition: 01-Home or Self Care  Discharge Instructions    Call MD / Call 911   Complete by:  As directed    If you experience chest pain or shortness of breath, CALL 911 and be transported to the hospital emergency room.  If you develope a fever above 101 F, pus (white drainage) or increased drainage or redness at the wound, or calf pain, call your surgeon's office.   Constipation Prevention   Complete by:  As directed    Drink plenty of fluids.  Prune juice may be helpful.  You may use a stool softener, such as Colace (over the counter) 100 mg twice a day.  Use MiraLax (over the counter) for constipation as needed.   Diet general   Complete by:  As directed    Discharge instructions   Complete by:  As directed    Orthopaedic Trauma Service Discharge Instructions   General Discharge Instructions  WEIGHT BEARING STATUS: weightbearing as tolerated left leg   RANGE OF MOTION/ACTIVITY: unrestricted range of motion left knee  Wound Care: daily dressing changes starting on 09/28/2017. See below  Discharge Wound Care Instructions  Do NOT apply any ointments, solutions or lotions to pin sites or surgical wounds.  These prevent needed drainage and even though solutions like hydrogen peroxide kill bacteria, they also damage cells lining the pin sites that help fight infection.  Applying lotions or ointments can keep the wounds moist and can cause them to breakdown and  open up as well. This can increase the risk for infection. When in doubt call the office.  Surgical incisions should be dressed daily.  If any drainage is noted, use one layer of adaptic, then gauze, Kerlix, and an ace wrap.  Once the incision is completely dry and without drainage, it may be left open to air out.  Showering may begin 36-48 hours later.  Cleaning gently with soap and water.  Traumatic wounds should be dressed daily as well.    One layer of adaptic, gauze, Kerlix, then ace wrap.  The adaptic can be discontinued once the draining has ceased    If you have a wet to dry dressing: wet the gauze with saline the squeeze as much saline out so the gauze is moist (not soaking wet), place moistened gauze over wound, then place a dry gauze over the moist one, followed by Kerlix wrap, then ace wrap.  DVT/PE prophylaxis: aspirin 325 mg daily x 4 weeks   IV antibiotics as per infectious disease and Home health   Diet: as you were eating previously.  Can use over the counter stool softeners and bowel preparations, such as Miralax, to help with bowel movements.  Narcotics can be constipating.  Be sure to drink plenty of fluids  PAIN MEDICATION USE AND EXPECTATIONS  You have likely been given narcotic medications to help control your pain.  After a traumatic event that results in an fracture (broken bone) with or without surgery, it is ok to use narcotic pain medications to help control one's pain.  We understand that everyone responds to pain differently and each individual patient will be evaluated on a regular basis for the continued need for narcotic medications. Ideally, narcotic  medication use should last no more than 6-8 weeks (coinciding with fracture healing).   As a patient it is your responsibility as well to monitor narcotic medication use and report the amount and frequency you use these medications when you come to your office visit.   We would also advise that if you are using  narcotic medications, you should take a dose prior to therapy to maximize you participation.  IF YOU ARE ON NARCOTIC MEDICATIONS IT IS NOT PERMISSIBLE TO OPERATE A MOTOR VEHICLE (MOTORCYCLE/CAR/TRUCK/MOPED) OR HEAVY MACHINERY DO NOT MIX NARCOTICS WITH OTHER CNS (CENTRAL NERVOUS SYSTEM) DEPRESSANTS SUCH AS ALCOHOL   STOP SMOKING OR USING NICOTINE PRODUCTS!!!!  As discussed nicotine severely impairs your body's ability to heal surgical and traumatic wounds but also impairs bone healing.  Wounds and bone heal by forming microscopic blood vessels (angiogenesis) and nicotine is a vasoconstrictor (essentially, shrinks blood vessels).  Therefore, if vasoconstriction occurs to these microscopic blood vessels they essentially disappear and are unable to deliver necessary nutrients to the healing tissue.  This is one modifiable factor that you can do to dramatically increase your chances of healing your injury.    (This means no smoking, no nicotine gum, patches, etc)  DO NOT USE NONSTEROIDAL ANTI-INFLAMMATORY DRUGS (NSAID'S)  Using products such as Advil (ibuprofen), Aleve (naproxen), Motrin (ibuprofen) for additional pain control during fracture healing can delay and/or prevent the healing response.  If you would like to take over the counter (OTC) medication, Tylenol (acetaminophen) is ok.  However, some narcotic medications that are given for pain control contain acetaminophen as well. Therefore, you should not exceed more than 4000 mg of tylenol in a day if you do not have liver disease.  Also note that there are may OTC medicines, such as cold medicines and allergy medicines that my contain tylenol as well.  If you have any questions about medications and/or interactions please ask your doctor/PA or your pharmacist.      ICE AND ELEVATE INJURED/OPERATIVE EXTREMITY  Using ice and elevating the injured extremity above your heart can help with swelling and pain control.  Icing in a pulsatile fashion, such as  20 minutes on and 20 minutes off, can be followed.    Do not place ice directly on skin. Make sure there is a barrier between to skin and the ice pack.    Using frozen items such as frozen peas works well as the conform nicely to the are that needs to be iced.  USE AN ACE WRAP OR TED HOSE FOR SWELLING CONTROL  In addition to icing and elevation, Ace wraps or TED hose are used to help limit and resolve swelling.  It is recommended to use Ace wraps or TED hose until you are informed to stop.    When using Ace Wraps start the wrapping distally (farthest away from the body) and wrap proximally (closer to the body)   Example: If you had surgery on your leg or thing and you do not have a splint on, start the ace wrap at the toes and work your way up to the thigh        If you had surgery on your upper extremity and do not have a splint on, start the ace wrap at your fingers and work your way up to the upper arm  IF YOU ARE IN A SPLINT OR CAST DO NOT Lucien   If your splint gets wet for any reason please contact the  office immediately. You may shower in your splint or cast as long as you keep it dry.  This can be done by wrapping in a cast cover or garbage back (or similar)  Do Not stick any thing down your splint or cast such as pencils, money, or hangers to try and scratch yourself with.  If you feel itchy take benadryl as prescribed on the bottle for itching  IF YOU ARE IN A CAM BOOT (BLACK BOOT)  You may remove boot periodically. Perform daily dressing changes as noted below.  Wash the liner of the boot regularly and wear a sock when wearing the boot. It is recommended that you sleep in the boot until told otherwise  CALL THE OFFICE WITH ANY QUESTIONS OR CONCERNS: [phone]  OPAT Orders Discharge antibiotics: Vancomycin Per pharmacy protocol  Aim for Vancomycin trough 15-20 (unless otherwise indicated) Duration: 6 weeks End Date: 11/07/17  [facility] Care Per  Protocol:  Labs weekly while on IV antibiotics: _X_ CBC with differential _X_ BMP (bi-weekly) __ CMP _X_ CRP _X_ ESR _X_ Vancomycin trough  _X_ Please pull PIC at completion of IV antibiotics __ Please leave PIC in place until doctor has seen patient or been notified  Fax weekly labs to [phone]  Clinic Follow Up Appt: 1 month  with Brian Atkinson    Home infusion instructions [place] May follow [place] Dosing Protocol; May administer Cathflo as needed to maintain patency of vascular access device.; Flushing of vascular access device: per [facility] Protocol: 0.9% NaCl pre/post medica...   Complete by:  As directed    Instructions:  May follow [place] Dosing Protocol   Instructions:  May administer Cathflo as needed to maintain patency of vascular access device.   Instructions:  Flushing of vascular access device: per [facility] Protocol: 0.9% NaCl pre/post medication administration and prn patency; Heparin 100 u/ml, 52m for implanted ports and Heparin 10u/ml, 573mfor all other central venous catheters.   Instructions:  May follow [facility] Anaphylaxis Protocol for First Dose Administration in the home: 0.9% NaCl at 25-50 ml/hr to maintain IV access for protocol meds. Epinephrine 0.3 ml IV/IM PRN and Benadryl 25-50 IV/IM PRN s/s of anaphylaxis.   Instructions:  [facility] Vill[facility] Coordinator (RN) to assist per patient IV care needs in the home PRN.   Increase activity slowly as tolerated   Complete by:  As directed    Weight bearing as tolerated   Complete by:  As directed    Laterality:  left   Extremity:  Lower     Allergies as of 09/26/2017   No Known Allergies     Medication List    STOP taking these medications   doxycycline 100 MG tablet Commonly known as:  VIBRA-TABS     TAKE these medications   acetaminophen 500 MG tablet Commonly known as:  TYLENOL Take 1 tablet (500 mg total) by mouth every 6 (six) hours as needed for mild pain or moderate  pain.   aspirin EC 325 MG tablet Take 1 tablet (325 mg total) by mouth daily.   diclofenac 50 MG EC tablet Commonly known as:  VOLTAREN Take 1 tablet (50 mg total) by mouth 2 (two) times daily for 14 days.   docusate sodium 100 MG capsule Commonly known as:  COLACE Take 1 capsule (100 mg total) by mouth 2 (two) times daily.   methocarbamol 750 MG tablet Commonly known as:  ROBAXIN Take 1 tablet (750 mg total) by mouth every  8 (eight) hours as needed for muscle spasms.   oxyCODONE-acetaminophen 5-325 MG tablet Commonly known as:  ROXICET Take 1-2 tablets by mouth every 8 (eight) hours as needed for severe pain.   vancomycin IVPB Inject 1,500 mg into the vein every 12 (twelve) hours. Indication:  Patellar Osteomyelitis / MRSA infection Last Day of Therapy:  11/07/17 Labs - _0 /19/18 0844     Follow-up Information    Altamese El Rito, MD. Schedule an appointment as soon as possible for a visit in 3 week(s).   Specialty:  Orthopedic Surgery Contact information: [address] [place] [address] [phone]        Michel Bickers, MD. Schedule an appointment as soon as possible for a visit in 4 week(s).   Specialty:  Infectious Diseases Contact information: [address] [place] [address] [phone]           Discharge Instructions and Plan:  66 year old white male with chronic osteomyelitis left patella s/p I&D, PICC line placement  Weight-bear as tolerated left leg Aspirin for DVT prophylaxis as he is low risk Vancomycin per ID service Follow-up with orthopedics in 2 weeks and will likely follow-up with infectious disease in 4-6 weeks Dressing changes in 2 days to left knee   Signed:  Jari Pigg, PA-C [facility] [phone] (P) 09/26/2017, 12:10 PM

## 2017-09-26 NOTE — Progress Notes (Signed)
Orthopaedic Trauma Service (OTS)  1 Day Post-Op Procedure(s) (LRB): PARTIAL EXCISION OF LEFT PATELLA (Left)  Subjective: Patient reports pain as moderate.  Eager for discharge.  Objective: Current Vitals Blood pressure 111/69, pulse 73, temperature 98.6 F (37 C), temperature source Oral, resp. rate 18, height 5\' 11"  (1.803 m), weight 99.8 kg (220 lb), SpO2 98 %. Vital signs in last 24 hours: Temp:  [97.7 F (36.5 C)-98.6 F (37 C)] 98.6 F (37 C) (12/19 0617) Pulse Rate:  [69-93] 73 (12/19 0617) Resp:  [9-18] 18 (12/19 0617) BP: (102-130)/(61-80) 111/69 (12/19 0617) SpO2:  [95 %-100 %] 98 % (12/19 0617)  Intake/Output from previous day: 12/18 0701 - 12/19 0700 In: 3091.3 [P.O.:800; I.V.:2191.3; IV Piggyback:100] Out: 25 [Blood:25]  LABS Recent Labs    09/25/17 0648  HGB 14.3   Recent Labs    09/25/17 0648  WBC 10.5  RBC 4.68  HCT 42.2  PLT 233   Recent Labs    09/25/17 0648  NA 139  K 4.1  CL 106  CO2 24  BUN 16  CREATININE 0.95  GLUCOSE 102*  CALCIUM 9.1   Recent Labs    09/25/17 0648  INR 1.05   Multiple specimens including suture knot sent to micro; C&S pending  Physical Exam LLE  Dressing intact, clean, dry  Edema/ swelling controlled  Sens: DPN, SPN, TN intact  Motor: EHL, FHL, and lessor toe ext and flex all intact grossly  Brisk cap refill, warm to touch  Assessment/Plan: 1 Day Post-Op Procedure(s) (LRB): PARTIAL EXCISION OF LEFT PATELLA (Left) 1. PT/OT WBAT with knee motion as pain allows 2. DVT proph Lovenox then ECASA post d/c 3. Dr. Daiva EvesVan Dam of ID to see today prior to d/c; patient normally sees Dr. Orvan Falconerampbell 4. F/u 2 wks; may remove dressing and shower in 2 more days  Brian GalasMichael Niasia Lanphear, MD [facility] [phone] (p)

## 2017-09-26 NOTE — Care Management Note (Addendum)
Case Management Note  Patient Details  Name: Brian Atkinson MRN: [id number] Date of Birth: 09-22-1951  Subjective/Objective:                    Action/Plan: [facility]  Patient from home, has been at home with home health and IV ABX before.  Infusion company will be [facility]( patient has used them before). [facility] but has no preference on which agency .   [facility].   [facility] will come see patient here at hospital before discharge. Patient aware.  Patient's PCP is Dr Meredith [facility] [facility] [place] Patient's home address is [address] [place] [address], [place] [id number] phone [phone]   Expected Discharge Date:  09/26/17               Expected Discharge Plan:  Home w Home Health Services  In-House Referral:     Discharge planning Services  CM Consult  Post Acute Care Choice:  Home Health Choice offered to:  Patient  DME Arranged:    DME Agency:  [facility]  HH Arranged:  IV Antibiotics HH Agency:     Status of Service:  In process, will continue to follow  If discussed at Long Length of Stay Meetings, dates discussed:    Additional Comments:  Kingsley PlanWile, Brian Stimson Marie, RN 09/26/2017, 12:24 PM

## 2017-09-26 NOTE — Progress Notes (Addendum)
PHARMACY CONSULT NOTE FOR:  OUTPATIENT  PARENTERAL ANTIBIOTIC THERAPY (OPAT)  Indication: Patellar Osteomyelitis / MRSA infection  Regimen: vancomycin 1500 mg IV q12h End date: 11/07/17  Vancomycin trough needs to be completed at steady-state - due 12/21  IV antibiotic discharge orders are pended. To discharging provider:  please sign these orders via discharge navigator,  Select New Orders & click on the button choice - Manage This Unsigned Work.     Thank you for allowing pharmacy to be a part of this patient's care.  Loura BackJennifer Akron, PharmD, BCPS Clinical Pharmacist Phone for today [phone] Main pharmacy - [phone] 09/26/2017 10:34 AM

## 2017-09-26 NOTE — Progress Notes (Signed)
Discharge instructions given to pt. PICC flushed by IV team. Discharged to home accompanied by wife.

## 2017-09-26 NOTE — Discharge Instructions (Addendum)
Orthopaedic Trauma Service Discharge Instructions   General Discharge Instructions  WEIGHT BEARING STATUS: weightbearing as tolerated left leg   RANGE OF MOTION/ACTIVITY: unrestricted range of motion left knee  Wound Care: daily dressing changes starting on 09/28/2017. See below  Discharge Wound Care Instructions  Do NOT apply any ointments, solutions or lotions to pin sites or surgical wounds.  These prevent needed drainage and even though solutions like hydrogen peroxide kill bacteria, they also damage cells lining the pin sites that help fight infection.  Applying lotions or ointments can keep the wounds moist and can cause them to breakdown and open up as well. This can increase the risk for infection. When in doubt call the office.  Surgical incisions should be dressed daily.  If any drainage is noted, use one layer of adaptic, then gauze, Kerlix, and an ace wrap.  Once the incision is completely dry and without drainage, it may be left open to air out.  Showering may begin 36-48 hours later.  Cleaning gently with soap and water.  Traumatic wounds should be dressed daily as well.    One layer of adaptic, gauze, Kerlix, then ace wrap.  The adaptic can be discontinued once the draining has ceased    If you have a wet to dry dressing: wet the gauze with saline the squeeze as much saline out so the gauze is moist (not soaking wet), place moistened gauze over wound, then place a dry gauze over the moist one, followed by Kerlix wrap, then ace wrap.  DVT/PE prophylaxis: aspirin 325 mg daily x 4 weeks   IV antibiotics as per infectious disease and Home health   Diet: as you were eating previously.  Can use over the counter stool softeners and bowel preparations, such as Miralax, to help with bowel movements.  Narcotics can be constipating.  Be sure to drink plenty of fluids  PAIN MEDICATION USE AND EXPECTATIONS  You have likely been given narcotic medications to help control your pain.   After a traumatic event that results in an fracture (broken bone) with or without surgery, it is ok to use narcotic pain medications to help control one's pain.  We understand that everyone responds to pain differently and each individual patient will be evaluated on a regular basis for the continued need for narcotic medications. Ideally, narcotic medication use should last no more than 6-8 weeks (coinciding with fracture healing).   As a patient it is your responsibility as well to monitor narcotic medication use and report the amount and frequency you use these medications when you come to your office visit.   We would also advise that if you are using narcotic medications, you should take a dose prior to therapy to maximize you participation.  IF YOU ARE ON NARCOTIC MEDICATIONS IT IS NOT PERMISSIBLE TO OPERATE A MOTOR VEHICLE (MOTORCYCLE/CAR/TRUCK/MOPED) OR HEAVY MACHINERY DO NOT MIX NARCOTICS WITH OTHER CNS (CENTRAL NERVOUS SYSTEM) DEPRESSANTS SUCH AS ALCOHOL   STOP SMOKING OR USING NICOTINE PRODUCTS!!!!  As discussed nicotine severely impairs your body's ability to heal surgical and traumatic wounds but also impairs bone healing.  Wounds and bone heal by forming microscopic blood vessels (angiogenesis) and nicotine is a vasoconstrictor (essentially, shrinks blood vessels).  Therefore, if vasoconstriction occurs to these microscopic blood vessels they essentially disappear and are unable to deliver necessary nutrients to the healing tissue.  This is one modifiable factor that you can do to dramatically increase your chances of healing your injury.    (This  means no smoking, no nicotine gum, patches, etc)  DO NOT USE NONSTEROIDAL ANTI-INFLAMMATORY DRUGS (NSAID'S)  Using products such as Advil (ibuprofen), Aleve (naproxen), Motrin (ibuprofen) for additional pain control during fracture healing can delay and/or prevent the healing response.  If you would like to take over the counter (OTC) medication,  Tylenol (acetaminophen) is ok.  However, some narcotic medications that are given for pain control contain acetaminophen as well. Therefore, you should not exceed more than 4000 mg of tylenol in a day if you do not have liver disease.  Also note that there are may OTC medicines, such as cold medicines and allergy medicines that my contain tylenol as well.  If you have any questions about medications and/or interactions please ask your doctor/PA or your pharmacist.      ICE AND ELEVATE INJURED/OPERATIVE EXTREMITY  Using ice and elevating the injured extremity above your heart can help with swelling and pain control.  Icing in a pulsatile fashion, such as 20 minutes on and 20 minutes off, can be followed.    Do not place ice directly on skin. Make sure there is a barrier between to skin and the ice pack.    Using frozen items such as frozen peas works well as the conform nicely to the are that needs to be iced.  USE AN ACE WRAP OR TED HOSE FOR SWELLING CONTROL  In addition to icing and elevation, Ace wraps or TED hose are used to help limit and resolve swelling.  It is recommended to use Ace wraps or TED hose until you are informed to stop.    When using Ace Wraps start the wrapping distally (farthest away from the body) and wrap proximally (closer to the body)   Example: If you had surgery on your leg or thing and you do not have a splint on, start the ace wrap at the toes and work your way up to the thigh        If you had surgery on your upper extremity and do not have a splint on, start the ace wrap at your fingers and work your way up to the upper arm  IF YOU ARE IN A SPLINT OR CAST DO NOT [place]   If your splint gets wet for any reason please contact the office immediately. You may shower in your splint or cast as long as you keep it dry.  This can be done by wrapping in a cast cover or garbage back (or similar)  Do Not stick any thing down your splint or cast such as pencils,  money, or hangers to try and scratch yourself with.  If you feel itchy take benadryl as prescribed on the bottle for itching  IF YOU ARE IN A CAM BOOT (BLACK BOOT)  You may remove boot periodically. Perform daily dressing changes as noted below.  Wash the liner of the boot regularly and wear a sock when wearing the boot. It is recommended that you sleep in the boot until told otherwise  CALL THE OFFICE WITH ANY QUESTIONS OR CONCERNS: [phone]     IV antibiotic protocol   OPAT Orders Discharge antibiotics: Vancomycin Per pharmacy protocol  Aim for Vancomycin trough 15-20 (unless otherwise indicated) Duration: 6 weeks End Date: 11/07/17  [facility] Care Per Protocol:  Labs weekly while on IV antibiotics: _X_ CBC with differential _X_ BMP (bi-weekly) __ CMP _X_ CRP _X_ ESR _X_ Vancomycin trough  _X_ Please pull PIC at completion of IV  antibiotics __ Please leave PIC in place until doctor has seen patient or been notified  Fax weekly labs to [phone]  Clinic Follow Up Appt: 1 month  with Dr. Megan Salon

## 2017-09-26 NOTE — Evaluation (Signed)
Occupational Therapy Evaluation and Discharge Patient Details Name: Brian Atkinson MRN: [id number] DOB: 17-Nov-1950 Today's Date: 09/26/2017    History of Present Illness Pt is a 66 y.o. male s/p partial excision of L patella. PMH significant for MRSA, L patella fracture, and multiple L knee procedures.    Clinical Impression   PTA, pt was independent with ADL and functional mobility. He lives with his family who are able to provide 24 hour supervision/assistance. He currently requires supervision for toilet transfers and min guard assist for shower transfers with RW. Pt able to complete LB dressing tasks with supervision this session. Educated pt concerning compensatory ADL strategies, energy conservation strategies, and use of ice for pain management. He demonstrates understanding of all topics and reports no further questions/concerns. No further acute OT needs identified. OT will sign off.    Follow Up Recommendations  No OT follow up;Supervision/Assistance - 24 hour    Equipment Recommendations  None recommended by OT(declined shower seat or 3-in-1)    Recommendations for Other Services       Precautions / Restrictions Precautions Precautions: Fall Restrictions Weight Bearing Restrictions: Yes LLE Weight Bearing: Weight bearing as tolerated Other Position/Activity Restrictions: ROM to tolerance      Mobility Bed Mobility Overal bed mobility: Needs Assistance Bed Mobility: Supine to Sit;Sit to Supine     Supine to sit: Supervision Sit to supine: Supervision   General bed mobility comments: Supervision for safety.   Transfers Overall transfer level: Needs assistance Equipment used: Rolling walker (2 wheeled) Transfers: Sit to/from Stand Sit to Stand: Supervision         General transfer comment: Supervision for safety.     Balance Overall balance assessment: No apparent balance deficits (not formally assessed)                                          ADL either performed or assessed with clinical judgement   ADL Overall ADL's : Needs assistance/impaired Eating/Feeding: Set up;Sitting   Grooming: Supervision/safety;Standing   Upper Body Bathing: Set up;Sitting   Lower Body Bathing: Supervison/ safety;Sit to/from stand   Upper Body Dressing : Set up;Sitting   Lower Body Dressing: Supervision/safety;Sit to/from stand   Toilet Transfer: Firefighterupervision/safety;RW Toilet Transfer Details (indicate cue type and reason): Supervision for safety.  Toileting- Clothing Manipulation and Hygiene: Supervision/safety;Sit to/from stand   Tub/ Shower Transfer: Nurse, adultMin guard;Rolling walker;Ambulation   Functional mobility during ADLs: Rolling walker;Min guard General ADL Comments: Able to pick up items off of the floor with min guard assist.      Vision Baseline Vision/History: No visual deficits Patient Visual Report: No change from baseline Vision Assessment?: No apparent visual deficits     Perception     Praxis      Pertinent Vitals/Pain Pain Assessment: No/denies pain     Hand Dominance Right   Extremity/Trunk Assessment Upper Extremity Assessment Upper Extremity Assessment: Overall WFL for tasks assessed   Lower Extremity Assessment Lower Extremity Assessment: LLE deficits/detail LLE Deficits / Details: Decreased strength and AROM as expected post-operatively       Communication     Cognition Arousal/Alertness: Awake/alert Behavior During Therapy: WFL for tasks assessed/performed Overall Cognitive Status: Within Functional Limits for tasks assessed  General Comments       Exercises     Shoulder Instructions      Home Living Family/patient expects to be discharged to:: Private residence Living Arrangements: Spouse/significant other Available Help at Discharge: Family;Available 24 hours/day Type of Home: House Home Access: Level entry     Home Layout: Able  to live on main level with bedroom/bathroom     Bathroom Shower/Tub: Producer, television/film/videoWalk-in shower   Bathroom Toilet: Standard Bathroom Accessibility: Yes   Home Equipment: Environmental consultantWalker - 2 wheels;Cane - single point;Crutches          Prior Functioning/Environment Level of Independence: Independent                 OT Problem List: Decreased strength;Decreased activity tolerance;Impaired balance (sitting and/or standing);Decreased knowledge of use of DME or AE      OT Treatment/Interventions: Self-care/ADL training;Therapeutic exercise    OT Goals(Current goals can be found in the care plan section) Acute Rehab OT Goals Patient Stated Goal: go home ASAP OT Goal Formulation: With patient Time For Goal Achievement: 10/10/17 Potential to Achieve Goals: Good  OT Frequency: Min 2X/week   Barriers to D/C:            Co-evaluation              AM-PAC PT "6 Clicks" Daily Activity     Outcome Measure Help from another person eating meals?: None Help from another person taking care of personal grooming?: A Little Help from another person toileting, which includes using toliet, bedpan, or urinal?: A Little Help from another person bathing (including washing, rinsing, drying)?: A Little Help from another person to put on and taking off regular upper body clothing?: None Help from another person to put on and taking off regular lower body clothing?: A Little 6 Click Score: 20   End of Session Equipment Utilized During Treatment: Rolling walker Nurse Communication: Mobility status  Activity Tolerance: Patient tolerated treatment well Patient left: with call bell/phone within reach;in bed  OT Visit Diagnosis: Other abnormalities of gait and mobility (R26.89)                Time: [id number] OT Time Calculation (min): 16 min Charges:  OT General Charges $OT Visit: 1 Visit OT Evaluation $OT Eval Moderate Complexity: 1 Mod G-Codes:     Doristine Sectionharity A Allyson Tineo, MS OTR/L  Pager:  [phone]   [name] A Phiona Ramnauth 09/26/2017, 9:35 AM

## 2017-09-26 NOTE — Plan of Care (Signed)
  Education: Knowledge of General Education information will improve 09/26/2017 0525 - Progressing by Olena Materobinson, Javonda Suh G, RN Note POC reviewed with pt.; and pain management discussed.

## 2017-09-27 NOTE — OR Nursing (Signed)
Late entry by Timoteo Expose. Aiden Rao, RN to correct implants and supply charges.

## 2017-09-28 ENCOUNTER — Encounter (HOSPITAL_COMMUNITY): Payer: Self-pay | Admitting: Orthopedic Surgery

## 2017-09-30 LAB — AEROBIC/ANAEROBIC CULTURE W GRAM STAIN (SURGICAL/DEEP WOUND)

## 2017-09-30 LAB — AEROBIC/ANAEROBIC CULTURE (SURGICAL/DEEP WOUND)

## 2017-10-05 ENCOUNTER — Other Ambulatory Visit: Payer: Self-pay | Admitting: Pharmacist

## 2017-10-11 ENCOUNTER — Telehealth: Payer: Self-pay | Admitting: *Deleted

## 2017-10-11 NOTE — Telephone Encounter (Signed)
They can stop drawing CRPs now.

## 2017-10-11 NOTE — Telephone Encounter (Signed)
Victorino DikeJennifer at [facility] is calling for advice. Medicare no longer pays for CRP lab work, no matter what diagnosis is applied. Patient will have to pay out of pocket for each of these lab draws. Please advise if this is still a lab you want drawn on a regular, weekly basis. Andree CossHowell, Lucile Hillmann M, RN

## 2017-10-11 NOTE — Telephone Encounter (Signed)
Relayed verbal order to HarriettaJennifer at [facility]. Thanks!

## 2017-10-12 ENCOUNTER — Other Ambulatory Visit: Payer: Self-pay | Admitting: Pharmacist

## 2017-10-17 ENCOUNTER — Telehealth: Payer: Self-pay | Admitting: Internal Medicine

## 2017-10-17 NOTE — Telephone Encounter (Signed)
I received a phone call today from Dr. Myrene GalasMichael Atkinson.  [name] was in for routine suture removal after his recent repeat left knee surgery.  He has a history of MRSA infection following a patellar fracture several years ago.  He had persistent symptoms and infected suture was removed on 09/25/2017.  Cultures grew MRSA.  He has been on IV vancomycin.  Today Dr. Carola Atkinson noted some central redness around the incision.  There is no drainage and he is having no fever.  He is due to follow-up with me next week.  I will continue vancomycin alone for now.

## 2017-10-24 ENCOUNTER — Other Ambulatory Visit: Payer: Self-pay

## 2017-10-24 ENCOUNTER — Encounter: Payer: Self-pay | Admitting: Internal Medicine

## 2017-10-24 ENCOUNTER — Ambulatory Visit (INDEPENDENT_AMBULATORY_CARE_PROVIDER_SITE_OTHER): Payer: Medicare Other | Admitting: Internal Medicine

## 2017-10-24 ENCOUNTER — Telehealth: Payer: Self-pay | Admitting: *Deleted

## 2017-10-24 DIAGNOSIS — Z8614 Personal history of Methicillin resistant Staphylococcus aureus infection: Secondary | ICD-10-CM | POA: Diagnosis present

## 2017-10-24 MED ORDER — DOXYCYCLINE HYCLATE 100 MG PO TABS: 100.0000 mg | ORAL_TABLET | Freq: Two times a day (BID) | ORAL | 2 refills | Status: DC

## 2017-10-24 MED ORDER — VANCOMYCIN IV (FOR PTA / DISCHARGE USE ONLY): 1500.0000 mg | Freq: Two times a day (BID) | INTRAVENOUS | 0 refills | Status: AC

## 2017-10-24 NOTE — Progress Notes (Signed)
[place] for Infectious Disease  Patient Active Problem List   Diagnosis Date Noted  . History of MRSA infection left patella  09/26/2017    Priority: High  . Septic joint of left knee joint ([place]) 06/18/2014    Priority: High  . Patella fracture 06/12/2014    Priority: Medium  . MRSA (methicillin resistant Staphylococcus aureus)   . Acute osteomyelitis of patella (HCC)   . Painful orthopaedic hardware ([place]) 07/06/2015  . Bursitis 07/06/2015  . 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 Unspecified constipation 06/20/2014  . Postoperative anemia due to acute blood loss 06/18/2014  . Iron deficiency anemia 06/18/2014  . Hyponatremia 06/16/2014  . Uncontrolled pain 06/16/2014    Patient's Medications  New Prescriptions   DOXYCYCLINE (VIBRA-TABS) 100 MG TABLET    Take 1 tablet (100 mg total) by mouth 2 (two) times daily.  Previous Medications   ACETAMINOPHEN (TYLENOL) 500 MG TABLET    Take 1 tablet (500 mg total) by mouth every 6 (six) hours as needed for mild pain or moderate pain.   ASPIRIN EC 325 MG TABLET    Take 1 tablet (325 mg total) by mouth daily.   DOCUSATE SODIUM (COLACE) 100 MG CAPSULE    Take 1 capsule (100 mg total) by mouth 2 (two) times daily.   METHOCARBAMOL (ROBAXIN) 750 MG TABLET    Take 1 tablet (750 mg total) by mouth every 8 (eight) hours as needed for muscle spasms.   OXYCODONE-ACETAMINOPHEN (ROXICET) 5-325 MG TABLET    Take 1-2 tablets by mouth every 8 (eight) hours as needed for severe pain.  Modified Medications   Modified Medication Previous Medication   VANCOMYCIN IVPB vancomycin IVPB      Inject 1,500 mg into the vein every 12 (twelve) hours for 15 days. Indication:  Patellar Osteomyelitis / MRSA infection Last Day of Therapy:  11/07/17 Labs - Sunday/Monday:  CBC/D, BMP, and vancomycin trough. Labs - Thursday:  BMP and vancomycin trough Labs - Every other week:  ESR and CRP    Inject 1,500 mg into the vein every 12 (twelve) hours. Indication:   Patellar Osteomyelitis / MRSA infection Last Day of Therapy:  11/07/17 Labs - Sunday/Monday:  CBC/D, BMP, and vancomycin trough. Labs - Thursday:  BMP and vancomycin trough Labs - Every other week:  ESR and CRP  Discontinued Medications   No medications on file    Subjective: Dann is in for his routine follow-up visit.  His MRSA infection of his left patella flared late last year and he underwent surgery again on 09/25/2017.  Dr. Marcelino Scot was able to remove some retained suture.  Cultures were again positive for MRSA.  He is now 28 days into IV Vancomycin.  He is feeling much better.  He says that immediately after surgery that deep burning pain had resolved.  Last week he developed erythema over the incision.  He believes it was related to clothing rubbing against the incision and him being more active.  He has not had any problems tolerating his PICC or vancomycin.  Review of Systems: Review of Systems  Constitutional: Negative for chills, diaphoresis and fever.  Gastrointestinal: Negative for abdominal pain, diarrhea, nausea and vomiting.  Musculoskeletal: Positive for joint pain.    Past Medical History:  Diagnosis Date  . Complication of anesthesia    post-op memory problems  . MRSA (methicillin resistant Staphylococcus aureus)    left knee  . Patella fracture    left  Social History   Tobacco Use  . Smoking status: Never Smoker  . Smokeless tobacco: Never Used  Substance Use Topics  . Alcohol use: Yes    Comment: rare  . Drug use: No    No family history on file.  No Known Allergies  Objective: Vitals:   10/24/17 1414  Weight: 247 lb (112 kg)  Height: 5' 11.5" (1.816 m)   Body mass index is 33.97 kg/m.  Physical Exam  Constitutional: He is oriented to person, place, and time.  His weight is up about 17 pounds from recent inactivity.  He is in good spirits.  Musculoskeletal:  His left knee is slightly swollen and warm.  The central erythema has decreased  considerably compared with the picture that Dr. Marcelino Scot sent me last week.  Neurological: He is alert and oriented to person, place, and time.  Skin: No rash noted.  Left arm PICC site looks okay.  Psychiatric: Mood and affect normal.    Lab Results    Problem List Items Addressed This Visit      High   History of MRSA infection left patella     He is feeling much better than he has at any time since his patellar fracture 3 years ago.  I talked to him again about the uncertainty as to when it is safe to stop antibiotics.  I am very hopeful that we can cure him of this go around now that foreign material has been removed.  He would like to convert to oral doxycycline when he completes vancomycin on 11/07/2017 and take it for 6-8 weeks.  He will follow-up here in 6 weeks.      Relevant Medications   vancomycin IVPB   doxycycline (VIBRA-TABS) 100 MG tablet (Start on 11/08/2017)       Michel Bickers, MD [place] [facility] [phone] pager   [phone] cell 10/24/2017, 2:33 PM

## 2017-10-24 NOTE — Telephone Encounter (Signed)
Victorino DikeJennifer, RN, [facility], [place], [place] called to let Dr. Orvan Falconerampbell know that the CBC that was supposed to be drawn on Monday, 10/22/17, will be drawn on Thursday, 10/25/17.

## 2017-10-24 NOTE — Assessment & Plan Note (Signed)
He is feeling much better than he has at any time since his patellar fracture 3 years ago.  I talked to him again about the uncertainty as to when it is safe to stop antibiotics.  I am very hopeful that we can cure him of this go around now that foreign material has been removed.  He would like to convert to oral doxycycline when he completes vancomycin on 11/07/2017 and take it for 6-8 weeks.  He will follow-up here in 6 weeks.

## 2017-10-26 ENCOUNTER — Encounter: Payer: Self-pay | Admitting: Internal Medicine

## 2017-10-26 ENCOUNTER — Other Ambulatory Visit: Payer: Self-pay | Admitting: Pharmacist

## 2017-10-29 ENCOUNTER — Encounter: Payer: Self-pay | Admitting: Internal Medicine

## 2017-10-30 ENCOUNTER — Telehealth: Payer: Self-pay | Admitting: *Deleted

## 2017-10-30 NOTE — Telephone Encounter (Signed)
Per verbal order from Dr Orvan Falconerampbell, RN gave order to Eunice BlaseDebbie at [facility] pharmacy to pull PICC on or after 1/31.  Order repeated and verified. Andree CossHowell, Jianna Drabik M, RN

## 2017-11-02 ENCOUNTER — Other Ambulatory Visit: Payer: Self-pay | Admitting: Pharmacist

## 2017-11-04 ENCOUNTER — Encounter: Payer: Self-pay | Admitting: *Deleted

## 2017-11-04 ENCOUNTER — Encounter: Payer: Self-pay | Admitting: Internal Medicine

## 2017-11-09 ENCOUNTER — Other Ambulatory Visit: Payer: Self-pay | Admitting: Pharmacist

## 2017-11-19 NOTE — Progress Notes (Addendum)
Late entry for missed G-code for OT evaluation 09/26/17.    09/26/17 0900  OT G-codes **NOT FOR INPATIENT CLASS**  Functional Assessment Tool Used Clinical judgement  Functional Limitation Self care  Self Care Current Status ([id number](G8987) CI  Self Care Goal Status (U0454(G8988) CI  Self Care Discharge Status (U9811(G8989) CI   Doristine Sectionharity A Anise Harbin, MS OTR/L  Pager: [phone]

## 2017-11-20 NOTE — Progress Notes (Signed)
   09/26/17 1544  PT G-Codes **NOT FOR INPATIENT CLASS**  Functional Assessment Tool Used AM-PAC 6 Clicks Basic Mobility  Functional Limitation Mobility: Walking and moving around  Mobility: Walking and Moving Around Current Status [phone](G8978) CI  Mobility: Walking and Moving Around Goal Status [phone](G8979) [facility]  11/20/2017 Late entry g- codes Lurena JoinerRebecca B. Amor Packard, PT, DPT [phone]

## 2017-12-06 ENCOUNTER — Encounter: Payer: Self-pay | Admitting: Internal Medicine

## 2017-12-06 ENCOUNTER — Ambulatory Visit (INDEPENDENT_AMBULATORY_CARE_PROVIDER_SITE_OTHER): Payer: Medicare Other | Admitting: Internal Medicine

## 2017-12-06 DIAGNOSIS — Z8614 Personal history of Methicillin resistant Staphylococcus aureus infection: Secondary | ICD-10-CM

## 2017-12-06 DIAGNOSIS — M8618 Other acute osteomyelitis, other site: Secondary | ICD-10-CM | POA: Diagnosis not present

## 2017-12-06 MED ORDER — DOXYCYCLINE HYCLATE 100 MG PO TABS: 100.0000 mg | ORAL_TABLET | Freq: Two times a day (BID) | ORAL | 2 refills | Status: AC

## 2017-12-06 NOTE — Progress Notes (Signed)
[facility]  Patient Active Problem List   Diagnosis Date Noted  . History of MRSA infection left patella  09/26/2017    Priority: High  . 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 Patella fracture 06/12/2014    Priority: Medium  . MRSA (methicillin resistant Staphylococcus aureus)   . Acute osteomyelitis of patella (HCC)   . Painful orthopaedic hardware (HCC) 07/06/2015  . Bursitis 07/06/2015  . Osteomyelitis (HCC) 05/27/2015  . Unspecified constipation 06/20/2014  . Postoperative anemia due to acute blood loss 06/18/2014  . Iron deficiency anemia 06/18/2014  . Hyponatremia 06/16/2014  . Uncontrolled pain 06/16/2014    Patient's Medications  New Prescriptions   No medications on file  Previous Medications   ACETAMINOPHEN (TYLENOL) 500 MG TABLET    Take 1 tablet (500 mg total) by mouth every 6 (six) hours as needed for mild pain or moderate pain.   DOCUSATE SODIUM (COLACE) 100 MG CAPSULE    Take 1 capsule (100 mg total) by mouth 2 (two) times daily.   DOXYCYCLINE (VIBRA-TABS) 100 MG TABLET    Take 1 tablet (100 mg total) by mouth 2 (two) times daily.   METHOCARBAMOL (ROBAXIN) 750 MG TABLET    Take 1 tablet (750 mg total) by mouth every 8 (eight) hours as needed for muscle spasms.   OXYCODONE-ACETAMINOPHEN (ROXICET) 5-325 MG TABLET    Take 1-2 tablets by mouth every 8 (eight) hours as needed for severe pain.  Modified Medications   No medications on file  Discontinued Medications   No medications on file    Subjective: Brian Atkinson is in for his routine follow-up visit.  He has now completed 10 weeks of antibiotic therapy following his last surgery for persistent left patellar MRSA osteomyelitis.  Retained suture was removed at that time.  He is feeling markedly better.  He has been exercising and feels that he is about 95% back to normal.  He has had no problems tolerating doxycycline.  Review of Systems: Review  of Systems  Constitutional: Negative for chills, diaphoresis and fever.  Gastrointestinal: Negative for abdominal pain, diarrhea, nausea and vomiting.  Musculoskeletal: Positive for joint pain.    Past Medical History:  Diagnosis Date  . Complication of anesthesia    post-op memory problems  . MRSA (methicillin resistant Staphylococcus aureus)    left knee  . Patella fracture    left     Social History   Tobacco Use  . Smoking status: Never Smoker  . Smokeless tobacco: Never Used  Substance Use Topics  . Alcohol use: Yes    Comment: rare  . Drug use: No    No family history on file.  No Known Allergies  Objective: Vitals:   12/06/17 1004  BP: 126/78  Pulse: 97  Temp: 98 F (36.7 C)  TempSrc: Oral  Weight: 243 lb (110.2 kg)  Height: 5' 11.5" (1.816 m)   Body mass index is 33.42 kg/m.  Physical Exam  Constitutional: He is oriented to person, place, and time. No distress.  Musculoskeletal:  His left knee incision is fully healed.  There is only slight warmth around his knee.  Neurological: He is alert and oriented to person, place, and time.  Skin: No rash noted.  Psychiatric: Mood and affect normal.    Lab Results Sed Rate (mm/hr)  Date Value  09/25/2017 10  04/17/2017 9  05/27/2015 10  CRP  Date Value  09/25/2017 <0.8 mg/dL  78/29/562107/07/2017 1.7 mg/L  30/86/578408/18/2016 <0.5 mg/dL     Problem List Items Addressed This Visit      High   History of MRSA infection left patella      Unprioritized   Acute osteomyelitis of patella (HCC)    I am quite hopeful that his chronic infection has been cured by his latest surgery and a long course of antibiotics.  He will complete his current supply of doxycycline in about 1 month and then stop.  He will follow-up here in 3 months.          Brian AstersJohn Tobie Perdue, MD [facility] [phone] pager   [phone] cell 12/06/2017, 10:23 AM

## 2017-12-06 NOTE — Assessment & Plan Note (Signed)
I am quite hopeful that his chronic infection has been cured by his latest surgery and a long course of antibiotics.  He will complete his current supply of doxycycline in about 1 month and then stop.  He will follow-up here in 3 months.

## 2018-03-05 ENCOUNTER — Ambulatory Visit: Payer: Medicare Other | Admitting: Internal Medicine

## 2018-08-21 ENCOUNTER — Other Ambulatory Visit: Payer: Self-pay | Admitting: Internal Medicine

## 2019-05-22 ENCOUNTER — Encounter (HOSPITAL_COMMUNITY): Admission: RE | Payer: Self-pay | Source: Home / Self Care

## 2019-05-22 ENCOUNTER — Ambulatory Visit (HOSPITAL_COMMUNITY): Admission: RE | Admit: 2019-05-22 | Payer: Medicare Other | Source: Home / Self Care | Admitting: General Surgery

## 2019-05-22 SURGERY — REPAIR, HERNIA, UMBILICAL, LAPAROSCOPIC
Anesthesia: General

## 2020-07-23 ENCOUNTER — Emergency Department (HOSPITAL_COMMUNITY): Payer: Medicare Other

## 2020-07-23 ENCOUNTER — Emergency Department (HOSPITAL_COMMUNITY): Payer: Medicare Other | Admitting: Anesthesiology

## 2020-07-23 ENCOUNTER — Inpatient Hospital Stay (HOSPITAL_COMMUNITY)
Admission: EM | Admit: 2020-07-23 | Discharge: 2020-07-28 | DRG: 493 | Disposition: A | Discharge status: Home / Self Care | Payer: Medicare Other | Attending: Orthopedic Surgery | Admitting: Orthopedic Surgery

## 2020-07-23 ENCOUNTER — Encounter (HOSPITAL_COMMUNITY)
Admission: EM | Disposition: A | Discharge status: Home / Self Care | Payer: Self-pay | Source: Home / Self Care | Attending: Orthopedic Surgery

## 2020-07-23 ENCOUNTER — Encounter (HOSPITAL_COMMUNITY): Payer: Self-pay | Admitting: Radiology

## 2020-07-23 ENCOUNTER — Other Ambulatory Visit: Payer: Self-pay

## 2020-07-23 DIAGNOSIS — D509 Iron deficiency anemia, unspecified: Secondary | ICD-10-CM | POA: Diagnosis present

## 2020-07-23 DIAGNOSIS — Z419 Encounter for procedure for purposes other than remedying health state, unspecified: Secondary | ICD-10-CM

## 2020-07-23 DIAGNOSIS — M25462 Effusion, left knee: Secondary | ICD-10-CM | POA: Diagnosis present

## 2020-07-23 DIAGNOSIS — Z79899 Other long term (current) drug therapy: Secondary | ICD-10-CM

## 2020-07-23 DIAGNOSIS — Z8614 Personal history of Methicillin resistant Staphylococcus aureus infection: Secondary | ICD-10-CM | POA: Diagnosis present

## 2020-07-23 DIAGNOSIS — T148XXA Other injury of unspecified body region, initial encounter: Secondary | ICD-10-CM

## 2020-07-23 DIAGNOSIS — D62 Acute posthemorrhagic anemia: Secondary | ICD-10-CM | POA: Diagnosis present

## 2020-07-23 DIAGNOSIS — T1490XA Injury, unspecified, initial encounter: Secondary | ICD-10-CM

## 2020-07-23 DIAGNOSIS — S82142A Displaced bicondylar fracture of left tibia, initial encounter for closed fracture: Secondary | ICD-10-CM | POA: Diagnosis present

## 2020-07-23 DIAGNOSIS — W132XXA Fall from, out of or through roof, initial encounter: Secondary | ICD-10-CM | POA: Diagnosis present

## 2020-07-23 DIAGNOSIS — Z23 Encounter for immunization: Secondary | ICD-10-CM

## 2020-07-23 DIAGNOSIS — Z20822 Contact with and (suspected) exposure to covid-19: Secondary | ICD-10-CM | POA: Diagnosis present

## 2020-07-23 DIAGNOSIS — S82143A Displaced bicondylar fracture of unspecified tibia, initial encounter for closed fracture: Secondary | ICD-10-CM

## 2020-07-23 HISTORY — DX: Personal history of Methicillin resistant Staphylococcus aureus infection: Z86.14

## 2020-07-23 HISTORY — DX: Other acute osteomyelitis, other site: M86.18

## 2020-07-23 HISTORY — PX: EXTERNAL FIXATION LEG: SHX1549

## 2020-07-23 LAB — CBC WITH DIFFERENTIAL/PLATELET
Abs Immature Granulocytes: 0.04 10*3/uL (ref 0.00–0.07)
Basophils Absolute: 0.1 10*3/uL (ref 0.0–0.1)
Basophils Relative: 1 %
Eosinophils Absolute: 0.1 10*3/uL (ref 0.0–0.5)
Eosinophils Relative: 1 %
HCT: 31.8 % — ABNORMAL LOW (ref 39.0–52.0)
Hemoglobin: 9.7 g/dL — ABNORMAL LOW (ref 13.0–17.0)
Immature Granulocytes: 0 %
Lymphocytes Relative: 11 %
Lymphs Abs: 1.2 10*3/uL (ref 0.7–4.0)
MCH: 25.7 pg — ABNORMAL LOW (ref 26.0–34.0)
MCHC: 30.5 g/dL (ref 30.0–36.0)
MCV: 84.1 fL (ref 80.0–100.0)
Monocytes Absolute: 0.7 10*3/uL (ref 0.1–1.0)
Monocytes Relative: 6 %
Neutro Abs: 8.7 10*3/uL — ABNORMAL HIGH (ref 1.7–7.7)
Neutrophils Relative %: 81 %
Platelets: 305 10*3/uL (ref 150–400)
RBC: 3.78 MIL/uL — ABNORMAL LOW (ref 4.22–5.81)
RDW: 14.6 % (ref 11.5–15.5)
WBC: 10.8 10*3/uL — ABNORMAL HIGH (ref 4.0–10.5)
nRBC: 0 % (ref 0.0–0.2)

## 2020-07-23 LAB — COMPREHENSIVE METABOLIC PANEL
ALT: 16 U/L (ref 0–44)
AST: 20 U/L (ref 15–41)
Albumin: 3.7 g/dL (ref 3.5–5.0)
Alkaline Phosphatase: 63 U/L (ref 38–126)
Anion gap: 6 (ref 5–15)
BUN: 13 mg/dL (ref 8–23)
CO2: 22 mmol/L (ref 22–32)
Calcium: 8.4 mg/dL — ABNORMAL LOW (ref 8.9–10.3)
Chloride: 107 mmol/L (ref 98–111)
Creatinine, Ser: 0.96 mg/dL (ref 0.61–1.24)
GFR, Estimated: >60 mL/min (ref >60)
Glucose, Bld: 116 mg/dL — ABNORMAL HIGH (ref 70–99)
Potassium: 3.9 mmol/L (ref 3.5–5.1)
Sodium: 135 mmol/L (ref 135–145)
Total Bilirubin: 0.8 mg/dL (ref 0.3–1.2)
Total Protein: 6.7 g/dL (ref 6.5–8.1)

## 2020-07-23 LAB — TYPE AND SCREEN
ABO/RH(D): O POS
Antibody Screen: NEGATIVE

## 2020-07-23 LAB — RESP PANEL BY RT PCR (RSV, FLU A&B, COVID)
Influenza A by PCR: NEGATIVE
Influenza B by PCR: NEGATIVE
Respiratory Syncytial Virus by PCR: NEGATIVE
SARS Coronavirus 2 by RT PCR: NEGATIVE

## 2020-07-23 LAB — ETHANOL: Alcohol, Ethyl (B): <10 mg/dL (ref <10)

## 2020-07-23 LAB — PROTIME-INR
INR: 1 (ref 0.8–1.2)
Prothrombin Time: 13.1 seconds (ref 11.4–15.2)

## 2020-07-23 LAB — ABO/RH: ABO/RH(D): O POS

## 2020-07-23 LAB — SURGICAL PCR SCREEN
MRSA, PCR: NEGATIVE
Staphylococcus aureus: NEGATIVE

## 2020-07-23 SURGERY — EXTERNAL FIXATION, LOWER EXTREMITY
Anesthesia: General | Site: Leg Lower | Laterality: Left

## 2020-07-23 MED ORDER — ACETAMINOPHEN 10 MG/ML IV SOLN: 1000.0000 mg | Freq: Once | INTRAVENOUS | Status: DC | PRN

## 2020-07-23 MED ORDER — ACETAMINOPHEN 10 MG/ML IV SOLN
INTRAVENOUS | Status: DC | PRN
  Administered 2020-07-23: 1000 mg via INTRAVENOUS

## 2020-07-23 MED ORDER — HYDROMORPHONE HCL 1 MG/ML IJ SOLN
INTRAMUSCULAR | Status: AC
  Filled 2020-07-23: qty 1

## 2020-07-23 MED ORDER — ROCURONIUM BROMIDE 100 MG/10ML IV SOLN
INTRAVENOUS | Status: DC | PRN
  Administered 2020-07-23: 30 mg via INTRAVENOUS

## 2020-07-23 MED ORDER — FENTANYL CITRATE (PF) 250 MCG/5ML IJ SOLN
INTRAMUSCULAR | Status: DC | PRN
  Administered 2020-07-23 (×2): 50 ug via INTRAVENOUS
  Administered 2020-07-23: 100 ug via INTRAVENOUS
  Administered 2020-07-23: 50 ug via INTRAVENOUS

## 2020-07-23 MED ORDER — MEPERIDINE HCL 25 MG/ML IJ SOLN: 6.2500 mg | INTRAMUSCULAR | Status: DC | PRN

## 2020-07-23 MED ORDER — ACETAMINOPHEN 160 MG/5ML PO SOLN: 325.0000 mg | Freq: Once | ORAL | Status: DC | PRN

## 2020-07-23 MED ORDER — HYDROCODONE-ACETAMINOPHEN 7.5-325 MG PO TABS
ORAL_TABLET | ORAL | Status: AC
  Filled 2020-07-23: qty 2

## 2020-07-23 MED ORDER — SODIUM CHLORIDE 0.9 % IV BOLUS
500.0000 mL | Freq: Once | INTRAVENOUS | Status: AC
  Administered 2020-07-23: 500 mL via INTRAVENOUS

## 2020-07-23 MED ORDER — ACETAMINOPHEN 10 MG/ML IV SOLN
INTRAVENOUS | Status: AC
  Filled 2020-07-23: qty 100

## 2020-07-23 MED ORDER — FENTANYL CITRATE (PF) 100 MCG/2ML IJ SOLN
50.0000 ug | INTRAMUSCULAR | Status: DC | PRN
  Administered 2020-07-23: 50 ug via INTRAVENOUS
  Filled 2020-07-23: qty 2

## 2020-07-23 MED ORDER — HYDROCODONE-ACETAMINOPHEN 7.5-325 MG PO TABS
1.0000 | ORAL_TABLET | ORAL | Status: DC | PRN
  Administered 2020-07-23 – 2020-07-27 (×8): 2 via ORAL
  Filled 2020-07-23 (×8): qty 2

## 2020-07-23 MED ORDER — PROPOFOL 10 MG/ML IV BOLUS
INTRAVENOUS | Status: DC | PRN
  Administered 2020-07-23: 150 mg via INTRAVENOUS

## 2020-07-23 MED ORDER — CEFAZOLIN SODIUM-DEXTROSE 2-3 GM-%(50ML) IV SOLR
INTRAVENOUS | Status: DC | PRN
  Administered 2020-07-23: 2 g via INTRAVENOUS

## 2020-07-23 MED ORDER — SUGAMMADEX SODIUM 200 MG/2ML IV SOLN
INTRAVENOUS | Status: DC | PRN
  Administered 2020-07-23: 200 mg via INTRAVENOUS

## 2020-07-23 MED ORDER — HYDROMORPHONE HCL 1 MG/ML IJ SOLN
0.2500 mg | INTRAMUSCULAR | Status: DC | PRN
  Administered 2020-07-23: 0.5 mg via INTRAVENOUS
  Administered 2020-07-23: 0.25 mg via INTRAVENOUS
  Administered 2020-07-23: 0.5 mg via INTRAVENOUS

## 2020-07-23 MED ORDER — CHLORHEXIDINE GLUCONATE 0.12 % MT SOLN
OROMUCOSAL | Status: AC
  Filled 2020-07-23: qty 15

## 2020-07-23 MED ORDER — LIDOCAINE HCL (CARDIAC) PF 100 MG/5ML IV SOSY
PREFILLED_SYRINGE | INTRAVENOUS | Status: DC | PRN
  Administered 2020-07-23: 40 mg via INTRATRACHEAL

## 2020-07-23 MED ORDER — LACTATED RINGERS IV SOLN: INTRAVENOUS | Status: DC

## 2020-07-23 MED ORDER — HYDROMORPHONE HCL 1 MG/ML IJ SOLN
INTRAMUSCULAR | Status: AC
  Administered 2020-07-23: 0.25 mg via INTRAVENOUS
  Filled 2020-07-23: qty 1

## 2020-07-23 MED ORDER — CHLORHEXIDINE GLUCONATE 0.12 % MT SOLN: 15.0000 mL | Freq: Once | OROMUCOSAL | Status: DC

## 2020-07-23 MED ORDER — SODIUM CHLORIDE 0.9 % IR SOLN
Status: DC | PRN
  Administered 2020-07-23: 1000 mL

## 2020-07-23 MED ORDER — LACTATED RINGERS IV SOLN: INTRAVENOUS | Status: DC | PRN

## 2020-07-23 MED ORDER — MORPHINE SULFATE (PF) 4 MG/ML IV SOLN
4.0000 mg | Freq: Once | INTRAVENOUS | Status: AC
  Administered 2020-07-23: 4 mg via INTRAVENOUS
  Filled 2020-07-23: qty 1

## 2020-07-23 MED ORDER — HYDROCODONE-ACETAMINOPHEN 5-325 MG PO TABS
1.0000 | ORAL_TABLET | ORAL | Status: DC | PRN
  Administered 2020-07-24: 1 via ORAL
  Administered 2020-07-24 – 2020-07-26 (×6): 2 via ORAL
  Administered 2020-07-27: 1 via ORAL
  Administered 2020-07-28 (×2): 2 via ORAL
  Filled 2020-07-23 (×5): qty 2
  Filled 2020-07-23 (×2): qty 1
  Filled 2020-07-23 (×4): qty 2

## 2020-07-23 MED ORDER — IOHEXOL 300 MG/ML  SOLN
100.0000 mL | Freq: Once | INTRAMUSCULAR | Status: AC | PRN
  Administered 2020-07-23: 100 mL via INTRAVENOUS

## 2020-07-23 MED ORDER — SUCCINYLCHOLINE CHLORIDE 20 MG/ML IJ SOLN
INTRAMUSCULAR | Status: DC | PRN
  Administered 2020-07-23: 120 mg via INTRAVENOUS

## 2020-07-23 MED ORDER — ACETAMINOPHEN 325 MG PO TABS: 325.0000 mg | ORAL_TABLET | Freq: Once | ORAL | Status: DC | PRN

## 2020-07-23 MED ORDER — ONDANSETRON HCL 4 MG/2ML IJ SOLN
INTRAMUSCULAR | Status: DC | PRN
  Administered 2020-07-23: 4 mg via INTRAVENOUS

## 2020-07-23 MED ORDER — FENTANYL CITRATE (PF) 100 MCG/2ML IJ SOLN
75.0000 ug | Freq: Once | INTRAMUSCULAR | Status: AC
  Administered 2020-07-23: 75 ug via INTRAVENOUS
  Filled 2020-07-23: qty 2

## 2020-07-23 MED ORDER — AMISULPRIDE (ANTIEMETIC) 5 MG/2ML IV SOLN: 10.0000 mg | Freq: Once | INTRAVENOUS | Status: DC | PRN

## 2020-07-23 SURGICAL SUPPLY — 37 items
ALCOHOL 70% 16 OZ (MISCELLANEOUS) ×2 IMPLANT
BAR EXFX 500X11 NS LF (EXFIX) ×2
BAR GLASS FIBER EXFX 11X500 (EXFIX) ×4 IMPLANT
BNDG COHESIVE 6X5 TAN STRL LF (GAUZE/BANDAGES/DRESSINGS) ×2 IMPLANT
BNDG GAUZE ELAST 4 BULKY (GAUZE/BANDAGES/DRESSINGS) ×4 IMPLANT
COVER SURGICAL LIGHT HANDLE (MISCELLANEOUS) ×2 IMPLANT
DRAPE C-ARM 42X72 X-RAY (DRAPES) ×1 IMPLANT
DRAPE C-ARMOR (DRAPES) ×2 IMPLANT
DRAPE U-SHAPE 47X51 STRL (DRAPES) ×2 IMPLANT
DRSG PAD ABDOMINAL 8X10 ST (GAUZE/BANDAGES/DRESSINGS) ×2 IMPLANT
ELECT REM PT RETURN 9FT ADLT (ELECTROSURGICAL) ×2
ELECTRODE REM PT RTRN 9FT ADLT (ELECTROSURGICAL) ×1 IMPLANT
GAUZE SPONGE 4X4 12PLY STRL (GAUZE/BANDAGES/DRESSINGS) ×4 IMPLANT
GAUZE XEROFORM 5X9 LF (GAUZE/BANDAGES/DRESSINGS) ×2 IMPLANT
GLOVE BIO SURGEON STRL SZ7.5 (GLOVE) ×4 IMPLANT
GLOVE BIOGEL PI IND STRL 8 (GLOVE) ×2 IMPLANT
GLOVE BIOGEL PI INDICATOR 8 (GLOVE) ×2
GOWN STRL REUS W/ TWL LRG LVL3 (GOWN DISPOSABLE) ×1 IMPLANT
GOWN STRL REUS W/ TWL XL LVL3 (GOWN DISPOSABLE) ×1 IMPLANT
GOWN STRL REUS W/TWL LRG LVL3 (GOWN DISPOSABLE) ×2
GOWN STRL REUS W/TWL XL LVL3 (GOWN DISPOSABLE) ×6 IMPLANT
KIT BASIN OR (CUSTOM PROCEDURE TRAY) ×2 IMPLANT
KIT TURNOVER KIT B (KITS) ×2 IMPLANT
NS IRRIG 1000ML POUR BTL (IV SOLUTION) ×2 IMPLANT
PACK ORTHO EXTREMITY (CUSTOM PROCEDURE TRAY) ×2 IMPLANT
PAD ARMBOARD 7.5X6 YLW CONV (MISCELLANEOUS) ×4 IMPLANT
PIN CLAMP 2BAR 75MM BLUE (EXFIX) ×2 IMPLANT
PIN HALF 5.0X200MM (EXFIX) ×2 IMPLANT
PIN HALF YELLOW 5X160X35 (EXFIX) ×2 IMPLANT
SPONGE LAP 18X18 RF (DISPOSABLE) ×2 IMPLANT
STOCKINETTE IMPERVIOUS LG (DRAPES) ×2 IMPLANT
TOWEL GREEN STERILE (TOWEL DISPOSABLE) ×3 IMPLANT
TOWEL GREEN STERILE FF (TOWEL DISPOSABLE) ×3 IMPLANT
TUBE CONNECTING 12X1/4 (SUCTIONS) ×2 IMPLANT
UNDERPAD 30X36 HEAVY ABSORB (UNDERPADS AND DIAPERS) ×3 IMPLANT
WATER STERILE IRR 1000ML POUR (IV SOLUTION) ×4 IMPLANT
YANKAUER SUCT BULB TIP NO VENT (SUCTIONS) ×2 IMPLANT

## 2020-07-23 NOTE — Op Note (Signed)
Date of Surgery: 07/23/2020  INDICATIONS: Mr. Swindell is a 69 y.o.-year-old male who sustained a Left closed tibial plateau fracture; he was indicated for external fixation due to the displaced and unstable nature of the fracture and came to the operating room today for this procedure. The patient did consent to the procedure after discussion of the risks and benefits.   PREOPERATIVE DIAGNOSIS: left closed bicondylar tibial plateau fracture   POSTOPERATIVE DIAGNOSIS: Same.  PROCEDURE: External fixation left bicondylar tibial plateau fracture CPT 20690 uniplane,  Closed rx fx w/manip: Tibial plateau 78938  SURGEON: Maryan Rued, M.D.  Assistant: Dion Saucier, PA-C.  Attestation: Physician assistant was utilized at the procedure for aiding in positioning the patient, manipulating the fracture, placement of the external fixator and tightening of the device.  He was also utilized for transporting the patient back to PACU.  ANESTHESIA: general  IV FLUIDS AND URINE: See anesthesia.  ESTIMATED BLOOD LOSS: 10 mL.  IMPLANTS:  Zimmer large external fixator  DRAINS: None.  COMPLICATIONS: None.  DESCRIPTION OF PROCEDURE: The patient was identified in the preoperative holding area.  The operative site was marked by the surgeon and confirmed by the patient.  He was brought back to the operating room.  Anesthesia was induced by the anesthesia team.  A tourniquet was not utilized.  The operative extremity was prepped and draped in standard sterile fashion.  A timeout was performed.  Preoperative antibiotics were given.  The bony landmarks were palpated and the pin sites were marked on the skin.  Each Schanz pin was placed in the same fashion -- first drilling with the 3.5 mm drill while copiously irrigating, then hand placing the pin.  This was confirmed on x-ray on both views.  The ex-fix clamps were placed onto pins and the fracture was pulled into the proper alignment.  The clamps were completely  tightened.    Final x-rays were taken in AP and lateral views to confirm the reduction and pin lengths. The wounds were cleaned and dried a final time and a sterile dressing consisting of Xeroform and kerlix was placed.    Of note, the patient's compartments remained soft throughout the procedure.  The patient was then transferred back to the bed and left the operating room in stable condition.  All sponge and instrument counts were correct.  POSTOPERATIVE PLAN: Mr. Pulis will remain non weight bearing with the leg elevated.  he will return to the operating room for definitive fixation when the swelling has gone down.  Mr. Isham will receive DVT prophylaxis based on other medications, activity level, and risk ratio of bleeding to thrombosis.  Pin site care will be initiated on postoperative day one.  He will need inpatient status due to the necessity of a second procedure.  He will need close monitoring of his compartments as well as a second procedure by Dr. Myrene Galas with the orthopedic trauma service.   Maryan Rued, MD [facility] [phone]

## 2020-07-23 NOTE — Progress Notes (Signed)
Postoperative compartment check:  Left lower extremity was examined and evaluated in the PACU with the patient awake and alert.  He has no pain with passive or active range of motion of the left lower extremity.  He has compressible compartments.  Bounding 2+ pulses distal.  Sensation intact to light touch.  No signs or symptoms concerning for compartment syndrome at this time.

## 2020-07-23 NOTE — Transfer of Care (Signed)
Immediate Anesthesia Transfer of Care Note  Patient: Brian Atkinson  Procedure(s) Performed: EXTERNAL FIXATION LEG (Left Leg Lower)  Patient Location: PACU  Anesthesia Type:General  Level of Consciousness: drowsy  Airway & Oxygen Therapy: Patient Spontanous Breathing and Patient connected to face mask oxygen  Post-op Assessment: Report given to RN and Post -op Vital signs reviewed and stable  Post vital signs: Reviewed and stable  Last Vitals:  Vitals Value Taken Time  BP    Temp    Pulse    Resp    SpO2      Last Pain:  Vitals:   07/23/20 1830  TempSrc: Oral  PainSc:          Complications: No complications documented.

## 2020-07-23 NOTE — Brief Op Note (Signed)
07/23/2020  9:42 PM  PATIENT:  Brian Atkinson  69 y.o. male  PRE-OPERATIVE DIAGNOSIS:  Left tibial plateau fracture  POST-OPERATIVE DIAGNOSIS:  same  PROCEDURE:  Procedure(s): EXTERNAL FIXATION LEG (Left)  SURGEON:  Surgeon(s) and Role:    * Yolonda Kida, MD - Primary  PHYSICIAN ASSISTANT: Dion Saucier, PA-C.     ANESTHESIA:   general  EBL:  50 mL   BLOOD ADMINISTERED:none  DRAINS: none   LOCAL MEDICATIONS USED:  MARCAINE     SPECIMEN:  No Specimen  DISPOSITION OF SPECIMEN:  N/A  COUNTS:  YES  TOURNIQUET:  * No tourniquets in log *  DICTATION: .Note written in EPIC  PLAN OF CARE: Admit to inpatient   PATIENT DISPOSITION:  PACU - hemodynamically stable.   Delay start of Pharmacological VTE agent (>24hrs) due to surgical blood loss or risk of bleeding: not applicable

## 2020-07-23 NOTE — ED Triage Notes (Signed)
Pt fell 15 ft off onto the ground today. Pt arrived POV. He denied any LOC. C collar applied upon arrival as well as logrolling techniques to get patient onto stretcher. Pt alert and oriented. Complaining of right knee pain.

## 2020-07-23 NOTE — H&P (Signed)
ORTHOPAEDIC H and P  REQUESTING PHYSICIAN: No att. providers found  PCP:  Pcp, No  Chief Complaint: Left tibial plateau fracture  HPI: Brian Atkinson is a 69 y.o. male who complains of left knee pain and leg swelling following a fall from approximately 15 feet earlier today.  This occurred around 11:30 AM.  He was triaged up at the [facility].  There they determined that he had a tibial plateau fracture of the left leg.  Orthopedic surgery was consulted for management.  He is a established patient of my colleague Dr. Carola Frost.  He is currently not on call and I been asked to assist in temporizing measures until Dr. Carola Frost can address this in a definitive fashion.  Currently the patient denies any numbness or tingling.  He denies smoking or diabetes.  He is independent with ADLs prior to today's accident.  He denies low back pain or ankle pain.  Past Medical History:  Diagnosis Date  . Complication of anesthesia    post-op memory problems  . MRSA (methicillin resistant Staphylococcus aureus)    left knee  . Patella fracture    left    Past Surgical History:  Procedure Laterality Date  . BONE EXCISION Left 09/25/2017   Procedure: PARTIAL EXCISION OF LEFT PATELLA;  Surgeon: Myrene Galas, MD;  Location: [facility] OR;  Service: Orthopedics;  Laterality: Left;  . CATARACT EXTRACTION W/ INTRAOCULAR LENS  IMPLANT, BILATERAL    . COLONOSCOPY    . DEBRIDEMENT AND CLOSURE WOUND Left 06/19/2014   Procedure: DEBRIDEMENT AND CLOSURE LEFT KNEE;  Surgeon: Kathryne Hitch, MD;  Location: [facility] ORS;  Service: Orthopedics;  Laterality: Left;  . IRRIGATION AND DEBRIDEMENT KNEE Left 06/17/2014   Procedure: IRRIGATION AND DEBRIDEMENT KNEE;  Surgeon: Budd Palmer, MD;  Location: [facility] ORS;  Service: Orthopedics;  Laterality: Left;  . KNEE BURSECTOMY Left 05/27/2015  . KNEE BURSECTOMY Left 05/27/2015   Procedure: KNEE BURSECTOMY, suture removal patellar tendon;  Surgeon: Myrene Galas, MD;   Location: [facility] OR;  Service: Orthopedics;  Laterality: Left;  . ORIF PATELLA Left 06/12/2014   Procedure: OPEN REDUCTION INTERNAL (ORIF) FIXATION PATELLA;  Surgeon: Budd Palmer, MD;  Location: [facility] OR;  Service: Orthopedics;  Laterality: Left;  . REFRACTIVE SURGERY Bilateral   . TONSILLECTOMY     Social History   Socioeconomic History  . Marital status: Divorced    Spouse name: Not on file  . Number of children: Not on file  . Years of education: Not on file  . Highest education level: Not on file  Occupational History  . Not on file  Tobacco Use  . Smoking status: Never Smoker  . Smokeless tobacco: Never Used  Vaping Use  . Vaping Use: Never used  Substance and Sexual Activity  . Alcohol use: Yes    Comment: rare  . Drug use: No  . Sexual activity: Never  Other Topics Concern  . Not on file  Social History Narrative  . Not on file   Social Determinants of Health   Financial Resource Strain:   . Difficulty of Paying Living Expenses: Not on file  Food Insecurity:   . Worried About Programme researcher, broadcasting/film/video in the Last Year: Not on file  . Ran Out of Food in the Last Year: Not on file  Transportation Needs:   . Lack of Transportation (Medical): Not on file  . Lack of Transportation (Non-Medical): Not on file  Physical Activity:   . Days  of Exercise per Week: Not on file  . Minutes of Exercise per Session: Not on file  Stress:   . Feeling of Stress : Not on file  Social Connections:   . Frequency of Communication with Friends and Family: Not on file  . Frequency of Social Gatherings with Friends and Family: Not on file  . Attends Religious Services: Not on file  . Active Member of Clubs or Organizations: Not on file  . Attends Banker Meetings: Not on file  . Marital Status: Not on file   History reviewed. No pertinent family history. Allergies  Allergen Reactions  . Versed [Midazolam]     Memory problems   Prior to Admission medications   Medication Sig  Start Date End Date Taking? Authorizing Provider  acetaminophen (TYLENOL) 500 MG tablet Take 1 tablet (500 mg total) by mouth every 6 (six) hours as needed for mild pain or moderate pain. Patient not taking: Reported on 12/06/2017 09/26/17   Montez Morita, PA-C  docusate sodium (COLACE) 100 MG capsule Take 1 capsule (100 mg total) by mouth 2 (two) times daily. Patient not taking: Reported on 12/06/2017 09/26/17   Montez Morita, PA-C  methocarbamol (ROBAXIN) 750 MG tablet Take 1 tablet (750 mg total) by mouth every 8 (eight) hours as needed for muscle spasms. Patient not taking: Reported on 12/06/2017 09/26/17   Montez Morita, PA-C  Multiple Vitamin (MULTIVITAMIN ADULT PO) Take 1 tablet by mouth daily.    [provider]  oxyCODONE-acetaminophen (ROXICET) 5-325 MG tablet Take 1-2 tablets by mouth every 8 (eight) hours as needed for severe pain. Patient not taking: Reported on 12/06/2017 09/26/17   Montez Morita, PA-C   CT Head Wo Contrast  Result Date: 07/23/2020 CLINICAL DATA:  Fall from roof.  Head and neck trauma EXAM: CT HEAD WITHOUT CONTRAST CT CERVICAL SPINE WITHOUT CONTRAST TECHNIQUE: Multidetector CT imaging of the head and cervical spine was performed following the standard protocol without intravenous contrast. Multiplanar CT image reconstructions of the cervical spine were also generated. COMPARISON:  None. FINDINGS: CT HEAD FINDINGS Brain: No evidence of acute infarction, hemorrhage, hydrocephalus, extra-axial collection or mass lesion/mass effect. Vascular: Atherosclerotic calcifications involving the large vessels of the skull base. No unexpected hyperdense vessel. Skull: Normal. Negative for fracture or focal lesion. Sinuses/Orbits: No acute finding. Other: None. CT CERVICAL SPINE FINDINGS Alignment: Facet joints are aligned without dislocation or traumatic listhesis. Dens and lateral masses are aligned. Skull base and vertebrae: No acute fracture. No primary bone lesion or focal pathologic  process. Soft tissues and spinal canal: No prevertebral fluid or swelling. No visible canal hematoma. Disc levels: Mild intervertebral disc height loss and endplate spurring most pronounced at C5-6 and C6-7. No significant facet arthropathy. Upper chest: Visualized lung apices are clear. Other: None. IMPRESSION: 1. No acute intracranial findings. 2. No evidence of acute fracture or traumatic listhesis of the cervical spine. 3. 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 Electronically Signed   By: Duanne Guess D.O.   On: 07/23/2020 16:14   CT Cervical Spine Wo Contrast  Result Date: 07/23/2020 CLINICAL DATA:  Fall from roof.  Head and neck trauma EXAM: CT HEAD WITHOUT CONTRAST CT CERVICAL SPINE WITHOUT CONTRAST TECHNIQUE: Multidetector CT imaging of the head and cervical spine was performed following the standard protocol without intravenous contrast. Multiplanar CT image reconstructions of the cervical spine were also generated. COMPARISON:  None. FINDINGS: CT HEAD FINDINGS Brain: No evidence of acute  infarction, hemorrhage, hydrocephalus, extra-axial collection or mass lesion/mass effect. Vascular: Atherosclerotic calcifications involving the large vessels of the skull base. No unexpected hyperdense vessel. Skull: Normal. Negative for fracture or focal lesion. Sinuses/Orbits: No acute finding. Other: None. CT CERVICAL SPINE FINDINGS Alignment: Facet joints are aligned without dislocation or traumatic listhesis. Dens and lateral masses are aligned. Skull base and vertebrae: No acute fracture. No primary bone lesion or focal pathologic process. Soft tissues and spinal canal: No prevertebral fluid or swelling. No visible canal hematoma. Disc levels: Mild intervertebral disc height loss and endplate spurring most pronounced at C5-6 and C6-7. No significant facet arthropathy. Upper chest: Visualized lung apices are clear. Other: None. IMPRESSION: 1. No acute  intracranial findings. 2. No evidence of acute fracture or traumatic listhesis of the cervical spine. 3. 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 Electronically Signed   By: Duanne Guess D.O.   On: 07/23/2020 16:14   DG Pelvis Portable  Result Date: 07/23/2020 CLINICAL DATA:  Pain status post fall EXAM: PORTABLE PELVIS 1-2 VIEWS COMPARISON:  None. FINDINGS: There is no evidence of pelvic fracture or diastasis. No pelvic bone lesions are seen. IMPRESSION: Negative. Electronically Signed   By: Katherine Mantle M.D.   On: 07/23/2020 15:06   CT CHEST ABDOMEN PELVIS W CONTRAST  Result Date: 07/23/2020 CLINICAL DATA:  Fall 15 foot off a roof. EXAM: CT CHEST, ABDOMEN, AND PELVIS WITH CONTRAST TECHNIQUE: Multidetector CT imaging of the chest, abdomen and pelvis was performed following the standard protocol during bolus administration of intravenous contrast. CONTRAST:  OMNIPAQUE IOHEXOL 300 MG/ML  SOLN COMPARISON:  None. FINDINGS: CT CHEST FINDINGS Cardiovascular: No significant vascular findings. Normal heart size. No pericardial effusion. Mediastinum/Nodes: No enlarged mediastinal, hilar, or axillary lymph nodes. Thyroid gland, trachea, and esophagus demonstrate no significant findings. Lungs/Pleura: Lungs are clear. No pleural effusion or pneumothorax. Musculoskeletal: No chest wall mass or suspicious bone lesions identified. CT ABDOMEN PELVIS FINDINGS Hepatobiliary: No gallstones or biliary dilatation is noted. Several hepatic cysts are noted. Pancreas: Unremarkable. No pancreatic ductal dilatation or surrounding inflammatory changes. Spleen: Normal in size without focal abnormality. Adrenals/Urinary Tract: Adrenal glands are unremarkable. Kidneys are normal, without renal calculi, focal lesion, or hydronephrosis. Bladder is unremarkable. Stomach/Bowel: Stomach is within normal limits. Appendix appears normal. No evidence of bowel wall thickening,  distention, or inflammatory changes. Vascular/Lymphatic: Aortic atherosclerosis. No enlarged abdominal or pelvic lymph nodes. Reproductive: Prostate is unremarkable. Other: No ascites is noted. Small fat containing supraumbilical hernia is noted. Musculoskeletal: No acute or significant osseous findings. IMPRESSION: 1. Small fat containing supraumbilical hernia. 2. No other abnormality seen in the chest, abdomen or pelvis. 3. Aortic atherosclerosis. Aortic Atherosclerosis (ICD10-I70.0). Electronically Signed   By: Lupita Raider M.D.   On: 07/23/2020 16:20   DG Chest Portable 1 View  Result Date: 07/23/2020 CLINICAL DATA:  Pain status post fall EXAM: PORTABLE CHEST 1 VIEW COMPARISON:  06/16/2014 FINDINGS: The heart size and mediastinal contours are within normal limits. Both lungs are clear. The visualized skeletal structures are unremarkable. The lung volumes are low. IMPRESSION: No active disease. Electronically Signed   By: Katherine Mantle M.D.   On: 07/23/2020 15:09   DG Knee Left Port  Result Date: 07/23/2020 CLINICAL DATA:  Pain EXAM: PORTABLE LEFT KNEE - 1-2 VIEW COMPARISON:  April 15, 2017 FINDINGS: There is a highly comminuted and impacted fracture of the proximal tibia with intra-articular extension and involvement of both the lateral and  medial tibial plateaus. There is a large suprapatellar joint effusion. Large enthesophytes are noted arising from the patella. Advanced tricompartmental degenerative changes are noted. There is extensive surrounding soft tissue swelling. IMPRESSION: 1. Highly comminuted and impacted fracture of the proximal tibia with intra-articular extension and involvement of both the lateral and medial tibial plateaus. 2. Large suprapatellar joint effusion. 3. Advanced tricompartmental degenerative changes. Electronically Signed   By: Katherine Mantlehristopher  Green M.D.   On: 07/23/2020 15:08    Positive ROS: All other systems have been reviewed and were otherwise negative with the  exception of those mentioned in the HPI and as above.  Physical Exam: General: Alert, no acute distress Cardiovascular: No pedal edema Respiratory: No cyanosis, no use of accessory musculature GI: No organomegaly, abdomen is soft and non-tender Skin: No lesions in the area of chief complaint Neurologic: Sensation intact distally Psychiatric: Patient is competent for consent with normal mood and affect Lymphatic: No axillary or cervical lymphadenopathy  MUSCULOSKELETAL:  Left lower extremity:  He has moderate swelling about the calf.  Compartments are compressible.  Distally he has palpable 2+ dorsalis pedis pulse and posterior tibialis pulse.  Sensation is intact light touch.  He has no pain with passive or active range of motion at the ankle.  Moderate effusion at the knee.  Distally neurovascular intact.  Lumbar spine is nontender on palpation.  No pain along the hip girdle bilaterally.  No pain at the bilateral calcanei or ankles.  Assessment: Left closed type V Schatzker tibial plateau fracture  Plan: -Mr. Philley and I had a lengthy conversation regarding the nature of his injury.  This is a significant trauma to the left tibial plateau.  This is a low injury mechanism but certainly he is at risk for development of compartment syndrome as well as posttraumatic arthrosis in the face of pre-existing arthritis.  -At this juncture I would recommend temporizing this injury with a knee spanning external fixator.  This will help reestablish length and also allow the soft tissues to rest.  Currently he has no signs or symptoms concerning for compartment syndrome.  He does have some swelling as expected but he is now 8-1/2 hours out from the injury and his compartment exam is reassuring.  -Our plan will be for knee spanning external fixator with monitoring of the compartments over the weekend.  We will then defer back to the orthopedic trauma specialist and Dr. Myrene GalasMichael Handy for definitive  care.  -Mr. [name] is provided informed consent after consideration of the risk and benefits of this procedure at length.    Yolonda KidaJason Patrick Geraldine Tesar, MD Cell [phone]    07/23/2020 8:15 PM

## 2020-07-23 NOTE — ED Notes (Signed)
X Ray at bedside at this time.  

## 2020-07-23 NOTE — Anesthesia Procedure Notes (Signed)
Procedure Name: Intubation Date/Time: 07/23/2020 9:01 PM Performed by: Claudina Lick, CRNA Pre-anesthesia Checklist: Patient identified, Emergency Drugs available, Suction available, Patient being monitored and Timeout performed Patient Re-evaluated:Patient Re-evaluated prior to induction Oxygen Delivery Method: Circle system utilized Preoxygenation: Pre-oxygenation with 100% oxygen Induction Type: IV induction, Rapid sequence and Cricoid Pressure applied Laryngoscope Size: Miller and 2 Grade View: Grade I Tube type: Oral Tube size: 7.5 mm Number of attempts: 1 Airway Equipment and Method: Stylet Placement Confirmation: ETT inserted through vocal cords under direct vision,  positive ETCO2 and breath sounds checked- equal and bilateral Secured at: 22 cm Tube secured with: Tape Dental Injury: Teeth and Oropharynx as per pre-operative assessment

## 2020-07-23 NOTE — ED Notes (Addendum)
I assumed care of this patient at this time. At the time of assuming care, patient in supine position, clothing removed and C-Collar in place. Pt placed on continuous cardiac monitor, vital signs cycling q15 minutes. Pt educated on hourly rounding and call light use, pt verbalized understanding at this time. Assessment documented and completed at this time. Educated on spine precautions and is in agreement. Will continue to monitor.

## 2020-07-23 NOTE — ED Provider Notes (Signed)
[facility] EMERGENCY DEPARTMENT Provider Note   CSN: [id number] Arrival date & time: 07/23/20  1415     History Chief Complaint  Patient presents with  . Fall    Brian Atkinson is a 69 y.o. male with PMH significant for multiple surgeries involving his left knee who presents the ED in C-collar applied by EMS after he fell from approximately 15 feet.  Patient reports that he was on a forklift when he lost his balance and fell 15 feet and landed on his feet.  He complains exclusively of left knee pain.  Deformity noted on exam.  Patient denies any head injury, LOC, blood thinners, memory disturbance, chest pain or abdominal pain, nausea or vomiting, back pain, numbness or weakness, or other symptoms.    HPI     Past Medical History:  Diagnosis Date  . Complication of anesthesia    post-op memory problems  . MRSA (methicillin resistant Staphylococcus aureus)    left knee  . Patella fracture    left     Patient Active Problem List   Diagnosis Date Noted  . History of MRSA infection left patella  09/26/2017  . MRSA (methicillin resistant Staphylococcus aureus)   . Acute osteomyelitis of patella (HCC)   . Painful orthopaedic hardware (HCC) 07/06/2015  . Bursitis 07/06/2015  . Osteomyelitis (HCC) 05/27/2015  . Unspecified constipation 06/20/2014  . Postoperative anemia due to acute blood loss 06/18/2014  . Septic joint of left knee joint (HCC) 06/18/2014  . Iron deficiency anemia 06/18/2014  . Hyponatremia 06/16/2014  . Uncontrolled pain 06/16/2014  . Patella fracture 06/12/2014    Past Surgical History:  Procedure Laterality Date  . BONE EXCISION Left 09/25/2017   Procedure: PARTIAL EXCISION OF LEFT PATELLA;  Surgeon: Myrene Galas, MD;  Location: [facility] OR;  Service: Orthopedics;  Laterality: Left;  . CATARACT EXTRACTION W/ INTRAOCULAR LENS  IMPLANT, BILATERAL    . COLONOSCOPY    . DEBRIDEMENT AND CLOSURE WOUND Left 06/19/2014   Procedure: DEBRIDEMENT AND CLOSURE  LEFT KNEE;  Surgeon: Kathryne Hitch, MD;  Location: [facility] ORS;  Service: Orthopedics;  Laterality: Left;  . IRRIGATION AND DEBRIDEMENT KNEE Left 06/17/2014   Procedure: IRRIGATION AND DEBRIDEMENT KNEE;  Surgeon: Budd Palmer, MD;  Location: [facility] ORS;  Service: Orthopedics;  Laterality: Left;  . KNEE BURSECTOMY Left 05/27/2015  . KNEE BURSECTOMY Left 05/27/2015   Procedure: KNEE BURSECTOMY, suture removal patellar tendon;  Surgeon: Myrene Galas, MD;  Location: [facility] OR;  Service: Orthopedics;  Laterality: Left;  . ORIF PATELLA Left 06/12/2014   Procedure: OPEN REDUCTION INTERNAL (ORIF) FIXATION PATELLA;  Surgeon: Budd Palmer, MD;  Location: [facility] OR;  Service: Orthopedics;  Laterality: Left;  . REFRACTIVE SURGERY Bilateral   . TONSILLECTOMY         No family history on file.  Social History   Tobacco Use  . Smoking status: Never Smoker  . Smokeless tobacco: Never Used  Vaping Use  . Vaping Use: Never used  Substance Use Topics  . Alcohol use: Yes    Comment: rare  . Drug use: No    Home Medications Prior to Admission medications   Medication Sig Start Date End Date Taking? Authorizing Provider  Multiple Vitamin (MULTIVITAMIN ADULT PO) Take 1 tablet by mouth daily.   Yes [provider]  acetaminophen (TYLENOL) 500 MG tablet Take 1 tablet (500 mg total) by mouth every 6 (six) hours as needed for mild pain or moderate pain. Patient not taking: Reported  on 12/06/2017 09/26/17   Montez MoritaPaul, Keith, PA-C  docusate sodium (COLACE) 100 MG capsule Take 1 capsule (100 mg total) by mouth 2 (two) times daily. Patient not taking: Reported on 12/06/2017 09/26/17   Montez MoritaPaul, Keith, PA-C  methocarbamol (ROBAXIN) 750 MG tablet Take 1 tablet (750 mg total) by mouth every 8 (eight) hours as needed for muscle spasms. Patient not taking: Reported on 12/06/2017 09/26/17   Montez MoritaPaul, Keith, PA-C  oxyCODONE-acetaminophen (ROXICET) 5-325 MG tablet Take 1-2 tablets by mouth every 8 (eight) hours as needed for  severe pain. Patient not taking: Reported on 12/06/2017 09/26/17   Montez MoritaPaul, Keith, PA-C    Allergies    Patient has no known allergies.  Review of Systems   Review of Systems  All other systems reviewed and are negative.   Physical Exam Updated Vital Signs BP 133/90   Pulse 76   Resp 19   Ht 5\' 11"  (1.803 m)   Wt 97.5 kg   SpO2 100%   BMI 29.99 kg/m   Physical Exam Vitals and nursing note reviewed. Exam conducted with a chaperone present.  Constitutional:      Appearance: Normal appearance.  HENT:     Head: Normocephalic and atraumatic.     Comments: No palpable defects. Eyes:     General: No scleral icterus.    Extraocular Movements: Extraocular movements intact.     Conjunctiva/sclera: Conjunctivae normal.     Pupils: Pupils are equal, round, and reactive to light.  Neck:     Comments: Arrived in c-collar.  Trachea midline.  ROM fully intact. Cardiovascular:     Rate and Rhythm: Normal rate and regular rhythm.     Pulses: Normal pulses.     Heart sounds: Normal heart sounds.  Pulmonary:     Effort: Pulmonary effort is normal. No respiratory distress.     Breath sounds: Normal breath sounds.     Comments: Breath sounds intact bilaterally. Abdominal:     General: Abdomen is flat. There is no distension.     Palpations: Abdomen is soft.     Tenderness: There is no abdominal tenderness. There is no guarding.     Comments: No bruising.  Musculoskeletal:     Cervical back: Normal range of motion and neck supple. No rigidity.     Comments: Left knee: Significant swelling and tenderness, most notably over the lateral aspect.  No ecchymosis, yet.  Pedal pulse and sensation intact.  Can dorsiflex and plantarflex ankle against resistance.  Can wiggle toes.  Denies any tenderness over hip.  Will not flex left hip due to pain involving left knee.  Skin:    General: Skin is dry.     Capillary Refill: Capillary refill takes less than 2 seconds.  Neurological:     General: No  focal deficit present.     Mental Status: He is alert and oriented to person, place, and time.     GCS: GCS eye subscore is 4. GCS verbal subscore is 5. GCS motor subscore is 6.     Cranial Nerves: No cranial nerve deficit.     Sensory: No sensory deficit.     Motor: No weakness.     Coordination: Coordination normal.  Psychiatric:        Mood and Affect: Mood normal.        Behavior: Behavior normal.        Thought Content: Thought content normal.     ED Results / Procedures / Treatments   Labs (all  labs ordered are listed, but only abnormal results are displayed) Labs Reviewed  COMPREHENSIVE METABOLIC PANEL - Abnormal; Notable for the following components:      Result Value   Glucose, Bld 116 (*)    Calcium 8.4 (*)    All other components within normal limits  CBC WITH DIFFERENTIAL/PLATELET - Abnormal; Notable for the following components:   WBC 10.8 (*)    RBC 3.78 (*)    Hemoglobin 9.7 (*)    HCT 31.8 (*)    MCH 25.7 (*)    Neutro Abs 8.7 (*)    All other components within normal limits  RESP PANEL BY RT PCR (RSV, FLU A&B, COVID)  ETHANOL  PROTIME-INR  URINALYSIS, ROUTINE W REFLEX MICROSCOPIC  TYPE AND SCREEN  ABO/RH    EKG EKG Interpretation  Date/Time:  Friday July 23 2020 14:37:18 EDT Ventricular Rate:  84 PR Interval:    QRS Duration: 102 QT Interval:  393 QTC Calculation: 465 R Axis:   108 Text Interpretation: Right and left arm electrode reversal, interpretation assumes no reversal Sinus rhythm Probable lateral infarct, old No STEMI Confirmed by Alona Bene [phone]) on 07/23/2020 3:08:12 PM   Radiology CT Head Wo Contrast  Result Date: 07/23/2020 CLINICAL DATA:  Fall from roof.  Head and neck trauma EXAM: CT HEAD WITHOUT CONTRAST CT CERVICAL SPINE WITHOUT CONTRAST TECHNIQUE: Multidetector CT imaging of the head and cervical spine was performed following the standard protocol without intravenous contrast. Multiplanar CT image reconstructions of the  cervical spine were also generated. COMPARISON:  None. FINDINGS: CT HEAD FINDINGS Brain: No evidence of acute infarction, hemorrhage, hydrocephalus, extra-axial collection or mass lesion/mass effect. Vascular: Atherosclerotic calcifications involving the large vessels of the skull base. No unexpected hyperdense vessel. Skull: Normal. Negative for fracture or focal lesion. Sinuses/Orbits: No acute finding. Other: None. CT CERVICAL SPINE FINDINGS Alignment: Facet joints are aligned without dislocation or traumatic listhesis. Dens and lateral masses are aligned. Skull base and vertebrae: No acute fracture. No primary bone lesion or focal pathologic process. Soft tissues and spinal canal: No prevertebral fluid or swelling. No visible canal hematoma. Disc levels: Mild intervertebral disc height loss and endplate spurring most pronounced at C5-6 and C6-7. No significant facet arthropathy. Upper chest: Visualized lung apices are clear. Other: None. IMPRESSION: 1. No acute intracranial findings. 2. No evidence of acute fracture or traumatic listhesis of the cervical spine. 3. 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 Electronically Signed   By: Duanne Guess D.O.   On: 07/23/2020 16:14   CT Cervical Spine Wo Contrast  Result Date: 07/23/2020 CLINICAL DATA:  Fall from roof.  Head and neck trauma EXAM: CT HEAD WITHOUT CONTRAST CT CERVICAL SPINE WITHOUT CONTRAST TECHNIQUE: Multidetector CT imaging of the head and cervical spine was performed following the standard protocol without intravenous contrast. Multiplanar CT image reconstructions of the cervical spine were also generated. COMPARISON:  None. FINDINGS: CT HEAD FINDINGS Brain: No evidence of acute infarction, hemorrhage, hydrocephalus, extra-axial collection or mass lesion/mass effect. Vascular: Atherosclerotic calcifications involving the large vessels of the skull base. No unexpected hyperdense vessel. Skull: Normal.  Negative for fracture or focal lesion. Sinuses/Orbits: No acute finding. Other: None. CT CERVICAL SPINE FINDINGS Alignment: Facet joints are aligned without dislocation or traumatic listhesis. Dens and lateral masses are aligned. Skull base and vertebrae: No acute fracture. No primary bone lesion or focal pathologic process. Soft tissues and spinal canal: No prevertebral fluid or swelling. No visible  canal hematoma. Disc levels: Mild intervertebral disc height loss and endplate spurring most pronounced at C5-6 and C6-7. No significant facet arthropathy. Upper chest: Visualized lung apices are clear. Other: None. IMPRESSION: 1. No acute intracranial findings. 2. No evidence of acute fracture or traumatic listhesis of the cervical spine. 3. 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 Electronically Signed   By: Duanne Guess D.O.   On: 07/23/2020 16:14   DG Pelvis Portable  Result Date: 07/23/2020 CLINICAL DATA:  Pain status post fall EXAM: PORTABLE PELVIS 1-2 VIEWS COMPARISON:  None. FINDINGS: There is no evidence of pelvic fracture or diastasis. No pelvic bone lesions are seen. IMPRESSION: Negative. Electronically Signed   By: Katherine Mantle M.D.   On: 07/23/2020 15:06   CT CHEST ABDOMEN PELVIS W CONTRAST  Result Date: 07/23/2020 CLINICAL DATA:  Fall 15 foot off a roof. EXAM: CT CHEST, ABDOMEN, AND PELVIS WITH CONTRAST TECHNIQUE: Multidetector CT imaging of the chest, abdomen and pelvis was performed following the standard protocol during bolus administration of intravenous contrast. CONTRAST:  OMNIPAQUE IOHEXOL 300 MG/ML  SOLN COMPARISON:  None. FINDINGS: CT CHEST FINDINGS Cardiovascular: No significant vascular findings. Normal heart size. No pericardial effusion. Mediastinum/Nodes: No enlarged mediastinal, hilar, or axillary lymph nodes. Thyroid gland, trachea, and esophagus demonstrate no significant findings. Lungs/Pleura: Lungs are clear. No  pleural effusion or pneumothorax. Musculoskeletal: No chest wall mass or suspicious bone lesions identified. CT ABDOMEN PELVIS FINDINGS Hepatobiliary: No gallstones or biliary dilatation is noted. Several hepatic cysts are noted. Pancreas: Unremarkable. No pancreatic ductal dilatation or surrounding inflammatory changes. Spleen: Normal in size without focal abnormality. Adrenals/Urinary Tract: Adrenal glands are unremarkable. Kidneys are normal, without renal calculi, focal lesion, or hydronephrosis. Bladder is unremarkable. Stomach/Bowel: Stomach is within normal limits. Appendix appears normal. No evidence of bowel wall thickening, distention, or inflammatory changes. Vascular/Lymphatic: Aortic atherosclerosis. No enlarged abdominal or pelvic lymph nodes. Reproductive: Prostate is unremarkable. Other: No ascites is noted. Small fat containing supraumbilical hernia is noted. Musculoskeletal: No acute or significant osseous findings. IMPRESSION: 1. Small fat containing supraumbilical hernia. 2. No other abnormality seen in the chest, abdomen or pelvis. 3. Aortic atherosclerosis. Aortic Atherosclerosis (ICD10-I70.0). Electronically Signed   By: Lupita Raider M.D.   On: 07/23/2020 16:20   DG Chest Portable 1 View  Result Date: 07/23/2020 CLINICAL DATA:  Pain status post fall EXAM: PORTABLE CHEST 1 VIEW COMPARISON:  06/16/2014 FINDINGS: The heart size and mediastinal contours are within normal limits. Both lungs are clear. The visualized skeletal structures are unremarkable. The lung volumes are low. IMPRESSION: No active disease. Electronically Signed   By: Katherine Mantle M.D.   On: 07/23/2020 15:09   DG Knee Left Port  Result Date: 07/23/2020 CLINICAL DATA:  Pain EXAM: PORTABLE LEFT KNEE - 1-2 VIEW COMPARISON:  April 15, 2017 FINDINGS: There is a highly comminuted and impacted fracture of the proximal tibia with intra-articular extension and involvement of both the lateral and medial tibial plateaus.  There is a large suprapatellar joint effusion. Large enthesophytes are noted arising from the patella. Advanced tricompartmental degenerative changes are noted. There is extensive surrounding soft tissue swelling. IMPRESSION: 1. Highly comminuted and impacted fracture of the proximal tibia with intra-articular extension and involvement of both the lateral and medial tibial plateaus. 2. Large suprapatellar joint effusion. 3. Advanced tricompartmental degenerative changes. Electronically Signed   By: Katherine Mantle M.D.   On: 07/23/2020 15:08    Procedures Procedures (including  critical care time)  Medications Ordered in ED Medications  fentaNYL (SUBLIMAZE) injection 50 mcg (50 mcg Intravenous Given 07/23/20 1459)  fentaNYL (SUBLIMAZE) injection 75 mcg (has no administration in time range)  sodium chloride 0.9 % bolus 500 mL (0 mLs Intravenous Stopped 07/23/20 1544)  iohexol (OMNIPAQUE) 300 MG/ML solution 100 mL (100 mLs Intravenous Contrast Given 07/23/20 1550)  morphine 4 MG/ML injection 4 mg (4 mg Intravenous Given 07/23/20 1653)    ED Course  I have reviewed the triage vital signs and the nursing notes.  Pertinent labs & imaging results that were available during my care of the patient were reviewed by me and considered in my medical decision making (see chart for details).  Clinical Course as of Jul 23 1816  Caleen Essex Jul 23, 2020  1728 I spoke with Dr. Madelon Lips, sports medicine, will try to reach out to Dr. Carola Frost regarding this patient.  He then called me back and informed me that Dr. Carola Frost had spoken with Dr. Aundria Rud who will accept patient for admission.    [GG]    Clinical Course User Index [GG] Elvera Maria   MDM Rules/Calculators/A&P                          Patient was evaluated along with Dr. Jacqulyn Bath.  Given mechanism of injury, will obtain trauma work-up including laboratory work-up as well as CT imaging of head as well as chest/abdomen/pelvis along with plain films of  chest, pelvis, and knee.  Will administer fentanyl for pain.  Imaging Plain films left knee portable demonstrates a highly comminuted impacted fracture of the proximal tibia with intra-articular extension involvement of both lateral medial tibial plateaus.  There is also a large suprapatellar joint effusion and advanced tricompartmental degenerative changes noted on x-ray. Remainder of imaging is unremarkable.  Labs reviewed are notable for a new normocytic anemia with hemoglobin of 9.7 down from 14.3 and labs obtained 2 years ago.   On reassessment, patient's pain relatively well-controlled with morphine 4 mg IV.   Given significant comminuted proximal tibial/tibial plateau fractures, will consult orthopedics for admission.  Prior orthopedic surgeries on his knee were performed by Dr. Carola Frost, most recent surgical intervention 09/25/2017.   Initially a consult with Dr. Roda Shutters as he works with Dr. Magnus Ivan who performed a debridement procedure in 2015, however given chronicity and that patient would prefer working with either Dr. Carola Frost, he advised that I consult Dr. Madelon Lips with sports medicine for admission and likely surgical intervention on Monday with Dr. Carola Frost.    I spoke with Dr. Madelon Lips, sports medicine, will try to reach out to Dr. Carola Frost regarding this patient.  He then called me back and informed me that Dr. Carola Frost had spoken with Dr. Aundria Rud who will accept patient for admission.  Confirmed this with Dr. Aundria Rud who asked that we send him to [facility] to have stabilizing procedure and then admission for observation and pain control.  He will be transferred by [facility].  COVID-19 testing in process.   Final Clinical Impression(s) / ED Diagnoses Final diagnoses:  Trauma  Closed fracture of tibial plateau, unspecified laterality, initial encounter    Rx / DC Orders ED Discharge Orders    None       Lorelee New, PA-C 07/23/20 1817    Maia Plan, MD 07/26/20 [phone]

## 2020-07-23 NOTE — Anesthesia Preprocedure Evaluation (Addendum)
Anesthesia Evaluation  Patient identified by MRN, date of birth, ID band Patient awake    Reviewed: Allergy & Precautions, NPO status , Patient's Chart, lab work & pertinent test results  Airway Mallampati: II  TM Distance: >3 FB Neck ROM: Full    Dental  (+) Teeth Intact, Dental Advisory Given   Pulmonary neg pulmonary ROS,    breath sounds clear to auscultation       Cardiovascular negative cardio ROS   Rhythm:Regular Rate:Normal     Neuro/Psych negative neurological ROS  negative psych ROS   GI/Hepatic negative GI ROS, Neg liver ROS,   Endo/Other    Renal/GU      Musculoskeletal  (+) Arthritis ,   Abdominal Normal abdominal exam  (+)   Peds  Hematology   Anesthesia Other Findings   Reproductive/Obstetrics                            Anesthesia Physical Anesthesia Plan  ASA: II  Anesthesia Plan: General   Post-op Pain Management:    Induction: Intravenous, Rapid sequence and Cricoid pressure planned  PONV Risk Score and Plan: 3 and Ondansetron, Midazolam and Dexamethasone  Airway Management Planned: Oral ETT  Additional Equipment: None  Intra-op Plan:   Post-operative Plan: Extubation in OR  Informed Consent: I have reviewed the patients History and Physical, chart, labs and discussed the procedure including the risks, benefits and alternatives for the proposed anesthesia with the patient or authorized representative who has indicated his/her understanding and acceptance.     Dental advisory given  Plan Discussed with: CRNA, Anesthesiologist and Surgeon  Anesthesia Plan Comments:       Anesthesia Quick Evaluation

## 2020-07-23 NOTE — ED Notes (Signed)
Handoff given to [facility] at this time.

## 2020-07-24 ENCOUNTER — Inpatient Hospital Stay (HOSPITAL_COMMUNITY): Payer: Medicare Other

## 2020-07-24 LAB — CBC
HCT: 25.6 % — ABNORMAL LOW (ref 39.0–52.0)
Hemoglobin: 7.8 g/dL — ABNORMAL LOW (ref 13.0–17.0)
MCH: 25.7 pg — ABNORMAL LOW (ref 26.0–34.0)
MCHC: 30.5 g/dL (ref 30.0–36.0)
MCV: 84.5 fL (ref 80.0–100.0)
Platelets: 233 10*3/uL (ref 150–400)
RBC: 3.03 MIL/uL — ABNORMAL LOW (ref 4.22–5.81)
RDW: 14.6 % (ref 11.5–15.5)
WBC: 8.9 10*3/uL (ref 4.0–10.5)
nRBC: 0 % (ref 0.0–0.2)

## 2020-07-24 LAB — CREATININE, SERUM
Creatinine, Ser: 0.93 mg/dL (ref 0.61–1.24)
GFR, Estimated: >60 mL/min (ref >60)

## 2020-07-24 MED ORDER — ENOXAPARIN SODIUM 40 MG/0.4ML ~~LOC~~ SOLN
40.0000 mg | SUBCUTANEOUS | Status: DC
  Administered 2020-07-24 – 2020-07-28 (×3): 40 mg via SUBCUTANEOUS
  Filled 2020-07-24 (×4): qty 0.4

## 2020-07-24 MED ORDER — KETOROLAC TROMETHAMINE 15 MG/ML IJ SOLN
7.5000 mg | Freq: Four times a day (QID) | INTRAMUSCULAR | Status: AC
  Administered 2020-07-24 (×4): 7.5 mg via INTRAVENOUS
  Filled 2020-07-24 (×4): qty 1

## 2020-07-24 MED ORDER — MORPHINE SULFATE (PF) 2 MG/ML IV SOLN
0.5000 mg | INTRAVENOUS | Status: DC | PRN
  Administered 2020-07-27: 1 mg via INTRAVENOUS
  Filled 2020-07-24: qty 1

## 2020-07-24 MED ORDER — ONDANSETRON HCL 4 MG PO TABS: 4.0000 mg | ORAL_TABLET | Freq: Four times a day (QID) | ORAL | Status: DC | PRN

## 2020-07-24 MED ORDER — METOCLOPRAMIDE HCL 5 MG/ML IJ SOLN: 5.0000 mg | Freq: Three times a day (TID) | INTRAMUSCULAR | Status: DC | PRN

## 2020-07-24 MED ORDER — CEFAZOLIN SODIUM-DEXTROSE 1-4 GM/50ML-% IV SOLN
1.0000 g | Freq: Four times a day (QID) | INTRAVENOUS | Status: AC
  Administered 2020-07-24 (×3): 1 g via INTRAVENOUS
  Filled 2020-07-24 (×4): qty 50

## 2020-07-24 MED ORDER — ACETAMINOPHEN 325 MG PO TABS: 325.0000 mg | ORAL_TABLET | Freq: Four times a day (QID) | ORAL | Status: DC | PRN

## 2020-07-24 MED ORDER — SODIUM CHLORIDE 0.9% IV SOLUTION: Freq: Once | INTRAVENOUS | Status: AC

## 2020-07-24 MED ORDER — ONDANSETRON HCL 4 MG/2ML IJ SOLN: 4.0000 mg | Freq: Four times a day (QID) | INTRAMUSCULAR | Status: DC | PRN

## 2020-07-24 MED ORDER — INFLUENZA VAC A&B SA ADJ QUAD 0.5 ML IM PRSY
0.5000 mL | PREFILLED_SYRINGE | INTRAMUSCULAR | Status: AC
  Administered 2020-07-25: 0.5 mL via INTRAMUSCULAR
  Filled 2020-07-24: qty 0.5

## 2020-07-24 MED ORDER — METOCLOPRAMIDE HCL 5 MG PO TABS: 5.0000 mg | ORAL_TABLET | Freq: Three times a day (TID) | ORAL | Status: DC | PRN

## 2020-07-24 MED ORDER — DOCUSATE SODIUM 100 MG PO CAPS
100.0000 mg | ORAL_CAPSULE | Freq: Two times a day (BID) | ORAL | Status: DC
  Administered 2020-07-24 – 2020-07-28 (×5): 100 mg via ORAL
  Filled 2020-07-24 (×8): qty 1

## 2020-07-24 MED ORDER — ACETAMINOPHEN 500 MG PO TABS
500.0000 mg | ORAL_TABLET | Freq: Four times a day (QID) | ORAL | Status: AC
  Administered 2020-07-24 (×3): 500 mg via ORAL
  Filled 2020-07-24 (×3): qty 1

## 2020-07-24 MED ORDER — PNEUMOCOCCAL VAC POLYVALENT 25 MCG/0.5ML IJ INJ
0.5000 mL | INJECTION | INTRAMUSCULAR | Status: AC
  Administered 2020-07-25: 0.5 mL via INTRAMUSCULAR
  Filled 2020-07-24: qty 0.5

## 2020-07-24 NOTE — Progress Notes (Signed)
   Subjective: 1 Day Post-Op Procedure(s) (LRB): EXTERNAL FIXATION LEG (Left)  Pt sitting up comfortably this morning C/o mild to moderate pain in the leg today Would like to elevate his leg more Denies any new symptoms or issues today Patient reports pain as mild.  Objective:   VITALS:   Vitals:   07/24/20 0413 07/24/20 0747  BP: 134/79 119/80  Pulse: 70 79  Resp: 16 16  Temp: 97.8 F (36.6 C) 97.8 F (36.6 C)  SpO2: 98% 97%    Left lower extremity with x-fix in place Dressing changed Small early fracture blisters to anterior mid shaft tibia  Compartments are supple nv intact distally No signs of compartment syndrome Minimal drainage from the x-fix sites  LABS Recent Labs    07/23/20 1454 07/24/20 0237  HGB 9.7* 7.8*  HCT 31.8* 25.6*  WBC 10.8* 8.9  PLT 305 233    Recent Labs    07/23/20 1454 07/24/20 0237  NA 135  --   K 3.9  --   BUN 13  --   CREATININE 0.96 0.93  GLUCOSE 116*  --      Assessment/Plan: 1 Day Post-Op Procedure(s) (LRB): EXTERNAL FIXATION LEG (Left) Blood loss anemia - will transfuse blood today due to dropping 2 units overnight Strict non weight bearing left lower extremity Pain management as needed Will continue to monitor his progress    Alphonsa Overall PA-C, MPAS [facility] is now [facility] [employer] [facility] [facility] [address], [place], [place] [address] Phone: [phone] [email or web address] Facebook  [employer]

## 2020-07-24 NOTE — Anesthesia Postprocedure Evaluation (Signed)
Anesthesia Post Note  Patient: Brian Atkinson  Procedure(s) Performed: EXTERNAL FIXATION LEG (Left Leg Lower)     Patient location during evaluation: PACU Anesthesia Type: General Level of consciousness: awake and alert Pain management: pain level controlled Vital Signs Assessment: post-procedure vital signs reviewed and stable Respiratory status: spontaneous breathing, nonlabored ventilation, respiratory function stable and patient connected to nasal cannula oxygen Cardiovascular status: blood pressure returned to baseline and stable Postop Assessment: no apparent nausea or vomiting Anesthetic complications: no   No complications documented.  Last Vitals:  Vitals:   07/23/20 2351 07/24/20 0005  BP: (!) 141/75 140/79  Pulse: 73 68  Resp: (!) 9 12  Temp:  36.5 C  SpO2: 98% 97%    Last Pain:  Vitals:   07/24/20 0005  TempSrc:   PainSc: Asleep                 Shelton Silvas

## 2020-07-24 NOTE — Plan of Care (Signed)
  Problem: Education: Goal: Knowledge of General Education information will improve Description: Including pain rating scale, medication(s)/side effects and non-pharmacologic comfort measures Outcome: Progressing   Problem: Health Behavior/Discharge Planning: Goal: Ability to manage health-related needs will improve Outcome: Progressing   Problem: Clinical Measurements: Goal: Will remain free from infection Outcome: Progressing   Problem: Pain Managment: Goal: General experience of comfort will improve Outcome: Progressing   

## 2020-07-25 LAB — TYPE AND SCREEN
ABO/RH(D): O POS
Antibody Screen: NEGATIVE
Unit division: 0
Unit division: 0

## 2020-07-25 LAB — HEMOGLOBIN AND HEMATOCRIT, BLOOD
HCT: 26.4 % — ABNORMAL LOW (ref 39.0–52.0)
Hemoglobin: 8.2 g/dL — ABNORMAL LOW (ref 13.0–17.0)

## 2020-07-25 LAB — BPAM RBC
Blood Product Expiration Date: 202111202359
Blood Product Expiration Date: 202111202359
ISSUE DATE / TIME: 202110161356
ISSUE DATE / TIME: 202110162215
Unit Type and Rh: 5100
Unit Type and Rh: 5100

## 2020-07-25 NOTE — Plan of Care (Signed)

## 2020-07-25 NOTE — Progress Notes (Signed)
PT Cancellation Note  Patient Details Name: Brian Atkinson MRN: [id number] DOB: [birth year]   Cancelled Treatment:    Reason Eval/Treat Not Completed: Other (comment)  Politely declining PT/mobility; wants to wait for Dr. Carola Frost and more definitive ortho treatment before moving;   Will follow,   Van Clines, PT  Acute Rehabilitation Services Pager [phone] Office [phone]   Brian Atkinson 07/25/2020, 5:05 PM

## 2020-07-25 NOTE — Progress Notes (Signed)
     Subjective: 2 Days Post-Op Procedure(s) (LRB): EXTERNAL FIXATION LEG (Left)   Patient reports pain as moderate, controlled with medication.  Nurse and patient state that the incisions at the distal pins continue to drain.  He received 2 units of blood and hgb at 8.2 this morning.  Lovenox held, as this did appear to cause more bleeding at the incision site.  Discussed how Dr. Carola Frost and his team are planning on seeing the patient tomorrow to take over care and probably operate on Tuesday.     Objective:   VITALS:   Vitals:   07/25/20 0500 07/25/20 0812  BP: 135/79 120/73  Pulse: 85 91  Resp: 17 17  Temp: 98.6 F (37 C) 98 F (36.7 C)  SpO2: 98% 99%    Patient awake and alert.   He has no pain with passive or active range of motion of the left lower extremity.   He has compressible compartments.   Bounding 2+ pulses distal.   Sensation intact to light touch.   No signs or symptoms concerning for compartment syndrome at this time.    LABS Recent Labs    07/23/20 1454 07/24/20 0237 07/25/20 0752  HGB 9.7* 7.8* 8.2*  HCT 31.8* 25.6* 26.4*  WBC 10.8* 8.9  --   PLT 305 233  --     Recent Labs    07/23/20 1454 07/24/20 0237  NA 135  --   K 3.9  --   BUN 13  --   CREATININE 0.96 0.93  GLUCOSE 116*  --      Assessment/Plan: 2 Days Post-Op Procedure(s) (LRB): EXTERNAL FIXATION LEG (Left)   Discussed with Dr. Aundria Rud Lovenox help due to the extra bleeding and lower HGB Discussed with patient how Dr. Carola Frost and his team are planning on seeing him and taking over care.   Probably operate on Tuesday.      Lanney Gins PA-C  [facility]  [place] [address], [place], [place] [address] Phone: [phone] [email or web address] Facebook  [employer]

## 2020-07-26 ENCOUNTER — Encounter (HOSPITAL_COMMUNITY): Payer: Self-pay | Admitting: Orthopedic Surgery

## 2020-07-26 LAB — URINALYSIS, ROUTINE W REFLEX MICROSCOPIC
Bilirubin Urine: NEGATIVE
Glucose, UA: NEGATIVE mg/dL
Hgb urine dipstick: NEGATIVE
Ketones, ur: NEGATIVE mg/dL
Leukocytes,Ua: NEGATIVE
Nitrite: NEGATIVE
Protein, ur: NEGATIVE mg/dL
Specific Gravity, Urine: 1.024 (ref 1.005–1.030)
pH: 5 (ref 5.0–8.0)

## 2020-07-26 LAB — CBC
HCT: 25.2 % — ABNORMAL LOW (ref 39.0–52.0)
Hemoglobin: 7.9 g/dL — ABNORMAL LOW (ref 13.0–17.0)
MCH: 26.5 pg (ref 26.0–34.0)
MCHC: 31.3 g/dL (ref 30.0–36.0)
MCV: 84.6 fL (ref 80.0–100.0)
Platelets: 228 10*3/uL (ref 150–400)
RBC: 2.98 MIL/uL — ABNORMAL LOW (ref 4.22–5.81)
RDW: 15.3 % (ref 11.5–15.5)
WBC: 10.4 10*3/uL (ref 4.0–10.5)
nRBC: 0 % (ref 0.0–0.2)

## 2020-07-26 LAB — VITAMIN D 25 HYDROXY (VIT D DEFICIENCY, FRACTURES): Vit D, 25-Hydroxy: 53.25 ng/mL (ref 30–100)

## 2020-07-26 MED ORDER — ACETAMINOPHEN 500 MG PO TABS
500.0000 mg | ORAL_TABLET | Freq: Two times a day (BID) | ORAL | Status: DC
  Administered 2020-07-26 – 2020-07-28 (×3): 500 mg via ORAL
  Filled 2020-07-26 (×3): qty 1

## 2020-07-26 MED ORDER — LACTATED RINGERS IV SOLN: INTRAVENOUS | Status: DC

## 2020-07-26 MED ORDER — METHOCARBAMOL 500 MG PO TABS
750.0000 mg | ORAL_TABLET | Freq: Three times a day (TID) | ORAL | Status: DC
  Administered 2020-07-26 – 2020-07-28 (×5): 750 mg via ORAL
  Filled 2020-07-26 (×5): qty 2

## 2020-07-26 MED ORDER — CEFAZOLIN SODIUM-DEXTROSE 2-4 GM/100ML-% IV SOLN
2.0000 g | Freq: Once | INTRAVENOUS | Status: AC
  Administered 2020-07-27: 2 g via INTRAVENOUS
  Filled 2020-07-26: qty 100

## 2020-07-26 NOTE — Evaluation (Signed)
Physical Therapy Evaluation Patient Details Name: Brian Atkinson MRN: [id number] DOB: 08/08/1951 Today's Date: 07/26/2020   History of Present Illness  Admitted after a fall 15 ft resulting in a complex L tibial plateau fracture; now s/p knee-spanning external fixation, NWB; Ortho Trauma took over care, and there are plans for more surgeries  Clinical Impression   Patient is s/p above surgery resulting in functional limitations due to the deficits listed below (see PT Problem List). Comes from home where he lives with family in a multistory house; able to stay on the basement floor where there is a level entrance; Presents to PT with decr functional mobility, decr activity tolerance, and incr pain postop; He is well-versed in keeping NWB with mobility, and I anticipate good progress;  Patient will benefit from skilled PT to increase their independence and safety with mobility to allow discharge to the venue listed below.       Follow Up Recommendations Outpatient PT;Supervision - Intermittent (The potential need for Outpatient PT can be addressed at Ortho follow-up appointments. )    Equipment Recommendations  Rolling walker with 5" wheels;3in1 (PT) (might already have)    Recommendations for Other Services       Precautions / Restrictions Precautions Precautions: Fall Restrictions LLE Weight Bearing: Non weight bearing Other Position/Activity Restrictions: LLE external fixator      Mobility  Bed Mobility Overal bed mobility: Needs Assistance Bed Mobility: Supine to Sit     Supine to sit: Min assist     General bed mobility comments: Used a strap for pt to move his LLE for initial moving towards EOB; handheld assist to pull t sit  Transfers Overall transfer level: Needs assistance Equipment used: Rolling walker (2 wheeled) Transfers: Sit to/from Stand Sit to Stand: Min guard;From elevated surface         General transfer comment: Cues for hand placement; good  rise  Ambulation/Gait Ambulation/Gait assistance: Min guard;+2 safety/equipment (chair push) Gait Distance (Feet): 6 Feet Assistive device: Rolling walker (2 wheeled) Gait Pattern/deviations:  (hop-to)     General Gait Details: Very nice maintenance of NWB; became dizzy with amb, and so we sat  Stairs            Wheelchair Mobility    Modified Rankin (Stroke Patients Only)       Balance                                             Pertinent Vitals/Pain Pain Assessment: Faces Pain Score: 7  Pain Location: L knee and thigh Pain Descriptors / Indicators: Aching;Grimacing Pain Intervention(s): Premedicated before session;Repositioned    Home Living Family/patient expects to be discharged to:: Private residence Living Arrangements: Spouse/significant other;Children Available Help at Discharge: Family;Available 24 hours/day Type of Home: House Home Access: Level entry     Home Layout: Able to live on main level with bedroom/bathroom Home Equipment: Walker - 2 wheels;Cane - single point;Crutches      Prior Function Level of Independence: Independent         Comments: He is familiar with managing NWB from previous orhtopedic surgeries     Hand Dominance        Extremity/Trunk Assessment   Upper Extremity Assessment Upper Extremity Assessment: Overall WFL for tasks assessed    Lower Extremity Assessment Lower Extremity Assessment: LLE deficits/detail LLE Deficits / Details: L knee-spanning external  fixator; distal sensation and motor intact; requires assist and external support when moving LLE        Communication   Communication: No difficulties  Cognition Arousal/Alertness: Awake/alert Behavior During Therapy: WFL for tasks assessed/performed Overall Cognitive Status: Within Functional Limits for tasks assessed                                        General Comments General comments (skin integrity, edema,  etc.): Obtained sitting BP due to dizziness, which was normal; dizziness light due to pain medications    Exercises     Assessment/Plan    PT Assessment Patient needs continued PT services  PT Problem List Decreased strength;Decreased range of motion;Decreased balance;Decreased activity tolerance;Decreased mobility;Decreased knowledge of use of DME;Pain       PT Treatment Interventions DME instruction;Gait training;Stair training;Functional mobility training;Therapeutic activities;Therapeutic exercise;Balance training;Patient/family education    PT Goals (Current goals can be found in the Care Plan section)  Acute Rehab PT Goals Patient Stated Goal: surgery to go well tomorrow, and get home PT Goal Formulation: With patient Time For Goal Achievement: 08/09/20 Potential to Achieve Goals: Good    Frequency Min 5X/week   Barriers to discharge        Co-evaluation               AM-PAC PT "6 Clicks" Mobility  Outcome Measure Help needed turning from your back to your side while in a flat bed without using bedrails?: A Little Help needed moving from lying on your back to sitting on the side of a flat bed without using bedrails?: A Little Help needed moving to and from a bed to a chair (including a wheelchair)?: A Little Help needed standing up from a chair using your arms (e.g., wheelchair or bedside chair)?: A Little Help needed to walk in hospital room?: A Little Help needed climbing 3-5 steps with a railing? : A Lot 6 Click Score: 17    End of Session Equipment Utilized During Treatment: Gait belt Activity Tolerance: Patient tolerated treatment well Patient left: in chair;with call bell/phone within reach Nurse Communication: Mobility status PT Visit Diagnosis: Other abnormalities of gait and mobility (R26.89)    Time: 1610-9604 PT Time Calculation (min) (ACUTE ONLY): 34 min   Charges:   PT Evaluation $PT Eval Moderate Complexity: 1 Mod PT Treatments $Gait  Training: 8-22 mins        Brian Atkinson, PT  Acute Rehabilitation Services Pager [phone] Office [phone]   Brian Atkinson 07/26/2020, 2:16 PM

## 2020-07-26 NOTE — Consult Note (Signed)
Orthopaedic Trauma Service (OTS) Consult   Patient ID: Brian Atkinson MRN: [id number] DOB/AGE: [birth year] 69 y.o.   Reason for Consult: Left bicondylar tibial plateau fracture Referring Physician: Duwayne HeckJason Rogers, MD (ortho)   HPI: Brian Atkinson is an 69 y.o. 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 This required multiple surgeries and long-term antibiotics to clear. Fortunately patient has been off antibiotics for about 2 years now. 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 Patient was up in a bucket when apparently the hydraulics let loose causing the bucket to fall which caused him to fall out of the bucket. Patient sustained an injury to his left knee. He was initially brought [facility] where he was found to have a complex left tibial plateau fracture. He was transferred to [facility] where he was seen and evaluated by Dr. Aundria Rudogers. Due to the magnitude of the injury patient was taken emergently to the operating room for application of a spanning external fixator. Due to the complexity injury Dr. Aundria Rudogers asserted that this is outside the scope of his practice for definitive fixation and since patient has a relationship with the orthopedic trauma service we were consulted once again for management of his complex left tibial plateau fracture. Patient has done well thus far since his external fixator is been applied. He has had some bleeding from his tibial pin sites but nothing too severe. His pain is tolerable thus far. He has been icing and elevating as much as possible.  Patient did receive 2 units of packed red blood cells on 07/25/2019 for acute blood loss anemia He remains asymptomatic  Patient seen and evaluated by the orthopedic trauma service on 07/26/2020. He denies any additional injuries  elsewhere. Denies any numbness or tingling to his lower extremities. No other complaints or concerns. 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  [place] in [place]. Lives with his family  Surgical PCR is negative for MRSA and MSSA    Past Medical History:  Diagnosis Date  . Complication of anesthesia    post-op memory problems  . MRSA (methicillin resistant Staphylococcus aureus)    left knee  . Patella fracture    left     Past Surgical History:  Procedure Laterality Date  . BONE EXCISION Left 09/25/2017   Procedure: PARTIAL EXCISION OF LEFT PATELLA;  Surgeon: Myrene GalasHandy, Michael, MD;  Location: [facility] OR;  Service: Orthopedics;  Laterality: Left;  . CATARACT EXTRACTION W/ INTRAOCULAR LENS  IMPLANT, BILATERAL    . COLONOSCOPY    . DEBRIDEMENT AND CLOSURE WOUND Left 06/19/2014   Procedure: DEBRIDEMENT AND CLOSURE LEFT KNEE;  Surgeon: Kathryne Hitchhristopher Y Blackman, MD;  Location: [facility] ORS;  Service: Orthopedics;  Laterality: Left;  . IRRIGATION AND DEBRIDEMENT KNEE Left 06/17/2014   Procedure: IRRIGATION AND DEBRIDEMENT KNEE;  Surgeon: Budd PalmerMichael H Handy, MD;  Location: [facility] ORS;  Service: Orthopedics;  Laterality: Left;  . KNEE BURSECTOMY Left 05/27/2015  . KNEE BURSECTOMY Left 05/27/2015   Procedure: KNEE BURSECTOMY, suture removal patellar tendon;  Surgeon: Myrene GalasMichael Handy, MD;  [facility];  Service: Orthopedics;  Laterality: Left;  . ORIF PATELLA Left 06/12/2014  Procedure: OPEN REDUCTION INTERNAL (ORIF) FIXATION PATELLA;  Surgeon: Budd Palmer, MD;  Location: [facility] OR;  Service: Orthopedics;  Laterality: Left;  . REFRACTIVE SURGERY Bilateral   . TONSILLECTOMY      History reviewed. No pertinent family history.  Social History:  reports that he has never smoked. He has never used smokeless tobacco. He reports current alcohol use. He reports that he does not use  drugs.  Allergies:  Allergies  Allergen Reactions  . Versed [Midazolam]     Memory problems    Medications: I have reviewed the patient's current medications. No outpatient medications have been marked as taking for the 07/23/20 encounter [facility] Encounter).     Results for orders placed or performed during the hospital encounter of 07/23/20 (from the past 48 hour(s))  Type and screen [facility]     Status: None   Collection Time: 07/24/20 10:34 AM  Result Value Ref Range   ABO/RH(D) O POS    Antibody Screen NEG    Sample Expiration 07/27/2020,2359    Unit Number [id number]    Blood Component Type RED CELLS,LR    Unit division 00    Status of Unit ISSUED,FINAL    Transfusion Status OK TO TRANSFUSE    Crossmatch Result      Compatible Performed at [facility], [address], [place], [place] [address]    Unit Number [id number]    Blood Component Type RED CELLS,LR    Unit division 00    Status of Unit ISSUED,FINAL    Transfusion Status OK TO TRANSFUSE    Crossmatch Result Compatible   Hemoglobin and hematocrit, blood     Status: Abnormal   Collection Time: 07/25/20  7:52 AM  Result Value Ref Range   Hemoglobin 8.2 (L) 13.0 - 17.0 g/dL   HCT 79.0 (L) 39 - 52 %    Comment: Performed at [facility], [address], [place], [place] [address]    CT KNEE LEFT WO CONTRAST  Result Date: 07/24/2020 CLINICAL DATA:  Tibial plateau fracture. EXAM: CT OF THE left KNEE WITHOUT CONTRAST TECHNIQUE: Multidetector CT imaging of the left knee was performed according to the standard protocol. Multiplanar CT image reconstructions were also generated. COMPARISON:  Radiographs 07/23/2020 FINDINGS: Bones/Joint/Cartilage Lauris Poag and Christell Constant type V, 4-part tibial plateau fracture with extensive comminution. Fracture planes in involves the medial and lateral tibial plateaus with inter sections along the tibial spine and with a comminuted transverse proximal  metaphyseal tibial fracture component. Lateral tibial plateau fracture components are impacted about 0.8 cm and there are some cortical fragments embedded in the fracture planes such as the 1.8 cm fragment imbedded along the transverse fracture plane on image 67 of series 8. 1.5 cm lateral displacement of the lateral most fragment of the lateral tibial plateau on image 74 of series 7. The transverse fracture plane extends into the tibial tubercle. No well-defined fracture of the proximal fibula or of the distal femur. Chronic appearing patellar deformity with sclerosis and elongated and partially fragmented osteophytes. Ligaments Suboptimally assessed by CT. Muscles and Tendons Expected edema along fascia planes adjacent to the fracture, without a large compartmental hematoma identified. Soft tissues Subcutaneous edema as expected given the fractures. Possible anterior calf cutaneous blistering along the lower margin of the CT scan as shown on image 180 of series 4. IMPRESSION: 1. Hohl and Moore type V, 4-part tibial plateau fracture with extensive comminution. 2. Chronic appearing patellar deformity with sclerosis and  elongated and partially fragmented osteophytes. 3. Possible anterior calf cutaneous blistering along the lower margin of the CT scan. Electronically Signed   By: Gaylyn Rong M.D.   On: 07/24/2020 10:22    Intake/Output      10/17 0701 - 10/18 0700 10/18 0701 - 10/19 0700   P.O. 1440    Blood     IV Piggyback     Total Intake(mL/kg) 1440 (14.8)    Urine (mL/kg/hr) 1700 (0.7)    Total Output 1700    Net -260            Review of Systems  Constitutional: Negative for chills and fever.  Eyes: Negative for blurred vision and double vision.  Respiratory: Negative for shortness of breath and wheezing.   Cardiovascular: Negative for chest pain and palpitations.  Gastrointestinal: Negative for abdominal pain, nausea and vomiting.  Genitourinary: Negative for dysuria.    Musculoskeletal: Positive for joint pain.       Left knee pain  Neurological: Negative for tingling and sensory change.  Psychiatric/Behavioral: Negative for substance abuse.   Blood pressure 112/74, pulse 90, temperature 97.9 F (36.6 C), temperature source Oral, resp. rate 18, height  (1.803 m), weight 97.5 kg, SpO2 100 %. Physical Exam Vitals and nursing note reviewed.  Constitutional:      General: He is awake. He is not in acute distress.    Appearance: Normal appearance. He is well-developed, well-groomed and normal weight.     Comments: pleasant  HENT:     Head: Normocephalic and atraumatic.     Mouth/Throat:     Mouth: Mucous membranes are moist.  Eyes:     Extraocular Movements: Extraocular movements intact.  Cardiovascular:     Rate and Rhythm: Normal rate and regular rhythm.  Pulmonary:     Effort: No accessory muscle usage or respiratory distress.  Abdominal:     Comments: Soft, NTND, + BS   Musculoskeletal:     Cervical back: Full passive range of motion without pain. No spinous process tenderness.     Comments: Pelvis     no traumatic wounds or rash, no ecchymosis, stable to manual stress, nontender  Left Lower Extremity  Inspection:   Spanning ex fix to L knee   2 half pins in femur, 2 half pins in tibia, 2 straight bars    Moderate swelling to L lower leg with fracture blisters anterolaterally and lateral proximal lower leg    Healed midline knee incision from previous surgeries    No complex open wounds    Bony eval:   TTP proximal tibial    Foot and ankle are nontender    Femur and hip are nontender   Soft tissue:    Moderate swelling L lower leg with fracture blisters    Skin does not wrinkle sufficiently with gentle compression to areas of anticipated surgical approach         Knee stability not assessed due to fracture and ex fix     Ankle is stable with ligamentous eval   Sensation:    DPN, SPN, TN sensation intact Motor: EHL, FHL,  lesser toe motor function intact. Ankle flexion, extension, inversion and eversion are grossly intact  Vascular:    Extremity is warm    Palpable DP pulse Compartments of the lower leg are full but remain soft and are nontender. No pain out of proportion with passive stretching of his toes or his ankle  Right lower extremity  no complex wounds, , no swelling or ecchymosis   Nontender hip, knee, ankle and foot             No crepitus or gross motion noted with manipulation of the Right leg  No knee or ankle effusion             No pain with axial loading or logrolling of the hip. Negative Stinchfield test   Knee stable to varus/ valgus and anterior/posterior stress             No pain with manipulation of the ankle or foot             No blocks to motion noted  Sens DPN, SPN, TN intact  Motor EHL, FHL, lesser toe motor, Ext, flex, evers 5/5  DP 2+, No significant edema             Compartments are soft and nontender, no pain with passive stretching  Bilateral upper Extremities     shoulder, elbow, wrist, digits- no skin wounds, nontender, no instability, no blocks to motion  Sens  Ax/R/M/U intact  Mot   Ax/ R/ PIN/ M/ AIN/ U intact  Rad 2+     Skin:    General: Skin is warm.     Capillary Refill: Capillary refill takes less than 2 seconds.  Neurological:     Mental Status: He is alert and oriented to person, place, and time.     Comments: Did not assess coordination, gait or station   Psychiatric:        Attention and Perception: Attention and perception normal.        Mood and Affect: Mood normal.        Speech: Speech normal.        Behavior: Behavior is cooperative.     Assessment/Plan:  69 year old male s/p fall with complex left bicondylar tibial plateau fracture  -fall from height  -Complex left bicondylar tibial plateau fracture status post external fixation  Patient is soft tissue injury is quite severe.  He has significant swelling and fracture  blisters present.  These will preclude early definitive fixation.  Post external fixator films do show some residual shortening.  We strongly recommend returning to the OR to see if we can obtain additional length.  I would anticipate him being ready for the OR for definitive fixation at about 10 to 14 days.  Patient has significant injury to his joint surface increases his risk for posttraumatic arthritis.  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  We will continue to monitor him very closely.  Fortunately his surgical PCR screens were negative for MRSA and MSSA   He will be nonweightbearing 8 weeks after definitive fixation.  Okay to start therapies at this time  Nonweightbearing left leg   Continue with aggressive ice and elevation for swelling control.  Keep foot and knee above heart is much as possible   Pin care done today by myself.  Adaptic and 4 x 4's and Kerlix applied over the fracture blisters.  We will have the Orth[employer] place a bulky compressive dressing from foot to thigh to help with swelling control as well   OR tomorrow for external fixator adjustment  - Pain management:  Continue with current regimen  - ABL anemia/Hemodynamics  CBC ordered for this morning  Suspect that he was somewhat dehydrated and he  is equilibrated from his fracture  Remains asymptomatic  - Medical issues   No chronic issues  - DVT/PE prophylaxis:  Okay to resume Lovenox  - ID:   Perioperative antibiotics  - Metabolic Bone Disease:  Check basic bone health labs  - Activity:  Nonweightbearing left leg otherwise out of bed as tolerated  - FEN/GI prophylaxis/Foley/Lines:  Regular diet  Npo after midnight  LR @ 75cc/hr  -Ex-fix/Splint care:  Ok to manipulate L leg by fixator   Dressing changed today, will change tomorrow in OR   - Impediments to fracture healing:  Complex fracture  pattern   - Dispo:  OR tomorrow for Ex fix adjustment  TOC consult for [facility] needs  Anticipate dc Thursday or Friday, follow up at office on 10/27 and plan for definitive fixation about 7 days after office follow up     [name], PA-C [phone] (C) 07/26/2020, 9:26 AM  [facility] [address] [place] [address] [phone] [phone] (F)    After 5pm and on the weekends please log on to Amion, go to orthopaedics and the look under the [facility] Call for the provider(s) on call. You can also call our office at [phone] and then follow the prompts to be connected to the call team.

## 2020-07-26 NOTE — Progress Notes (Signed)
Orthopedic Tech Progress Note Patient Details:  Brian Atkinson 1951-08-31 [id number]  Ortho Devices Type of Ortho Device: Ace wrap Ortho Device/Splint Location: lle Ortho Device/Splint Interventions: Ordered, Application   Post Interventions Patient Tolerated: Well   Brian Atkinson Brian Atkinson 07/26/2020, 1:32 PM

## 2020-07-27 ENCOUNTER — Inpatient Hospital Stay (HOSPITAL_COMMUNITY): Payer: Medicare Other

## 2020-07-27 ENCOUNTER — Inpatient Hospital Stay (HOSPITAL_COMMUNITY): Payer: Medicare Other | Admitting: Certified Registered Nurse Anesthetist

## 2020-07-27 ENCOUNTER — Encounter (HOSPITAL_COMMUNITY)
Admission: EM | Disposition: A | Discharge status: Home / Self Care | Payer: Self-pay | Source: Home / Self Care | Attending: Orthopedic Surgery

## 2020-07-27 ENCOUNTER — Encounter (HOSPITAL_COMMUNITY): Payer: Self-pay | Admitting: Orthopedic Surgery

## 2020-07-27 HISTORY — PX: EXTERNAL FIXATION LEG: SHX1549

## 2020-07-27 LAB — COMPREHENSIVE METABOLIC PANEL
ALT: 15 U/L (ref 0–44)
AST: 23 U/L (ref 15–41)
Albumin: 2.6 g/dL — ABNORMAL LOW (ref 3.5–5.0)
Alkaline Phosphatase: 45 U/L (ref 38–126)
Anion gap: 7 (ref 5–15)
BUN: 12 mg/dL (ref 8–23)
CO2: 24 mmol/L (ref 22–32)
Calcium: 7.8 mg/dL — ABNORMAL LOW (ref 8.9–10.3)
Chloride: 104 mmol/L (ref 98–111)
Creatinine, Ser: 0.79 mg/dL (ref 0.61–1.24)
GFR, Estimated: >60 mL/min (ref >60)
Glucose, Bld: 121 mg/dL — ABNORMAL HIGH (ref 70–99)
Potassium: 3.9 mmol/L (ref 3.5–5.1)
Sodium: 135 mmol/L (ref 135–145)
Total Bilirubin: <0.1 mg/dL — ABNORMAL LOW (ref 0.3–1.2)
Total Protein: 5.2 g/dL — ABNORMAL LOW (ref 6.5–8.1)

## 2020-07-27 LAB — PROTIME-INR
INR: 1.1 (ref 0.8–1.2)
Prothrombin Time: 13.4 seconds (ref 11.4–15.2)

## 2020-07-27 LAB — CBC
HCT: 23.9 % — ABNORMAL LOW (ref 39.0–52.0)
Hemoglobin: 7.4 g/dL — ABNORMAL LOW (ref 13.0–17.0)
MCH: 26.6 pg (ref 26.0–34.0)
MCHC: 31 g/dL (ref 30.0–36.0)
MCV: 86 fL (ref 80.0–100.0)
Platelets: 238 10*3/uL (ref 150–400)
RBC: 2.78 MIL/uL — ABNORMAL LOW (ref 4.22–5.81)
RDW: 15.2 % (ref 11.5–15.5)
WBC: 9.3 10*3/uL (ref 4.0–10.5)
nRBC: 0 % (ref 0.0–0.2)

## 2020-07-27 SURGERY — EXTERNAL FIXATION, LOWER EXTREMITY
Anesthesia: General | Site: Leg Lower | Laterality: Left

## 2020-07-27 MED ORDER — ONDANSETRON HCL 4 MG/2ML IJ SOLN
INTRAMUSCULAR | Status: AC
  Filled 2020-07-27: qty 2

## 2020-07-27 MED ORDER — PROPOFOL 10 MG/ML IV BOLUS
INTRAVENOUS | Status: DC | PRN
  Administered 2020-07-27: 150 mg via INTRAVENOUS

## 2020-07-27 MED ORDER — ORAL CARE MOUTH RINSE: 15.0000 mL | Freq: Once | OROMUCOSAL | Status: AC

## 2020-07-27 MED ORDER — HYDROMORPHONE HCL 1 MG/ML IJ SOLN
INTRAMUSCULAR | Status: AC
  Filled 2020-07-27: qty 1

## 2020-07-27 MED ORDER — LIDOCAINE 2% (20 MG/ML) 5 ML SYRINGE
INTRAMUSCULAR | Status: AC
  Filled 2020-07-27: qty 5

## 2020-07-27 MED ORDER — ONDANSETRON HCL 4 MG/2ML IJ SOLN
4.0000 mg | Freq: Once | INTRAMUSCULAR | Status: AC | PRN
  Administered 2020-07-27: 4 mg via INTRAVENOUS

## 2020-07-27 MED ORDER — 0.9 % SODIUM CHLORIDE (POUR BTL) OPTIME
TOPICAL | Status: DC | PRN
  Administered 2020-07-27: 1000 mL

## 2020-07-27 MED ORDER — CHLORHEXIDINE GLUCONATE 0.12 % MT SOLN
OROMUCOSAL | Status: AC
  Administered 2020-07-27: 15 mL via OROMUCOSAL
  Filled 2020-07-27: qty 15

## 2020-07-27 MED ORDER — ROCURONIUM BROMIDE 10 MG/ML (PF) SYRINGE
PREFILLED_SYRINGE | INTRAVENOUS | Status: DC | PRN
  Administered 2020-07-27: 40 mg via INTRAVENOUS
  Administered 2020-07-27 (×2): 10 mg via INTRAVENOUS

## 2020-07-27 MED ORDER — FENTANYL CITRATE (PF) 250 MCG/5ML IJ SOLN
INTRAMUSCULAR | Status: AC
  Filled 2020-07-27: qty 5

## 2020-07-27 MED ORDER — ONDANSETRON HCL 4 MG/2ML IJ SOLN
INTRAMUSCULAR | Status: DC | PRN
  Administered 2020-07-27: 4 mg via INTRAVENOUS

## 2020-07-27 MED ORDER — ROCURONIUM BROMIDE 10 MG/ML (PF) SYRINGE
PREFILLED_SYRINGE | INTRAVENOUS | Status: AC
  Filled 2020-07-27: qty 10

## 2020-07-27 MED ORDER — OXYCODONE HCL 5 MG/5ML PO SOLN: 5.0000 mg | Freq: Once | ORAL | Status: DC | PRN

## 2020-07-27 MED ORDER — HYDROMORPHONE HCL 1 MG/ML IJ SOLN
0.2500 mg | INTRAMUSCULAR | Status: DC | PRN
  Administered 2020-07-27 (×4): 0.5 mg via INTRAVENOUS

## 2020-07-27 MED ORDER — SUGAMMADEX SODIUM 200 MG/2ML IV SOLN
INTRAVENOUS | Status: DC | PRN
  Administered 2020-07-27: 200 mg via INTRAVENOUS

## 2020-07-27 MED ORDER — FENTANYL CITRATE (PF) 100 MCG/2ML IJ SOLN
INTRAMUSCULAR | Status: DC | PRN
  Administered 2020-07-27: 50 ug via INTRAVENOUS
  Administered 2020-07-27: 150 ug via INTRAVENOUS

## 2020-07-27 MED ORDER — DEXAMETHASONE SODIUM PHOSPHATE 10 MG/ML IJ SOLN
INTRAMUSCULAR | Status: AC
  Filled 2020-07-27: qty 1

## 2020-07-27 MED ORDER — LACTATED RINGERS IV SOLN: INTRAVENOUS | Status: DC

## 2020-07-27 MED ORDER — PROPOFOL 10 MG/ML IV BOLUS
INTRAVENOUS | Status: AC
  Filled 2020-07-27: qty 40

## 2020-07-27 MED ORDER — CHLORHEXIDINE GLUCONATE 0.12 % MT SOLN: 15.0000 mL | Freq: Once | OROMUCOSAL | Status: AC

## 2020-07-27 MED ORDER — OXYCODONE HCL 5 MG PO TABS: 5.0000 mg | ORAL_TABLET | Freq: Once | ORAL | Status: DC | PRN

## 2020-07-27 MED ORDER — LIDOCAINE 2% (20 MG/ML) 5 ML SYRINGE
INTRAMUSCULAR | Status: DC | PRN
  Administered 2020-07-27: 60 mg via INTRAVENOUS

## 2020-07-27 MED ORDER — DEXAMETHASONE SODIUM PHOSPHATE 10 MG/ML IJ SOLN
INTRAMUSCULAR | Status: DC | PRN
  Administered 2020-07-27: 8 mg via INTRAVENOUS

## 2020-07-27 SURGICAL SUPPLY — 45 items
BANDAGE ESMARK 6X9 LF (GAUZE/BANDAGES/DRESSINGS) ×1 IMPLANT
BNDG CMPR 9X6 STRL LF SNTH (GAUZE/BANDAGES/DRESSINGS) ×1
BNDG COHESIVE 6X5 TAN STRL LF (GAUZE/BANDAGES/DRESSINGS) IMPLANT
BNDG ELASTIC 4X5.8 VLCR STR LF (GAUZE/BANDAGES/DRESSINGS) ×2 IMPLANT
BNDG ELASTIC 6X5.8 VLCR STR LF (GAUZE/BANDAGES/DRESSINGS) ×2 IMPLANT
BNDG ESMARK 6X9 LF (GAUZE/BANDAGES/DRESSINGS) ×2
BNDG GAUZE ELAST 4 BULKY (GAUZE/BANDAGES/DRESSINGS) ×3 IMPLANT
BRUSH SCRUB EZ PLAIN DRY (MISCELLANEOUS) ×4 IMPLANT
COVER SURGICAL LIGHT HANDLE (MISCELLANEOUS) ×4 IMPLANT
COVER WAND RF STERILE (DRAPES) ×2 IMPLANT
DRAPE C-ARM 42X72 X-RAY (DRAPES) IMPLANT
DRAPE C-ARMOR (DRAPES) ×2 IMPLANT
DRAPE U-SHAPE 47X51 STRL (DRAPES) ×2 IMPLANT
DRSG ADAPTIC 3X8 NADH LF (GAUZE/BANDAGES/DRESSINGS) ×2 IMPLANT
DRSG MEPITEL 4X7.2 (GAUZE/BANDAGES/DRESSINGS) ×1 IMPLANT
ELECT REM PT RETURN 9FT ADLT (ELECTROSURGICAL) ×2
ELECTRODE REM PT RTRN 9FT ADLT (ELECTROSURGICAL) ×1 IMPLANT
GAUZE SPONGE 4X4 12PLY STRL (GAUZE/BANDAGES/DRESSINGS) ×2 IMPLANT
GLOVE BIO SURGEON STRL SZ7.5 (GLOVE) ×2 IMPLANT
GLOVE BIO SURGEON STRL SZ8 (GLOVE) ×3 IMPLANT
GLOVE BIOGEL PI IND STRL 7.5 (GLOVE) ×1 IMPLANT
GLOVE BIOGEL PI IND STRL 8 (GLOVE) ×1 IMPLANT
GLOVE BIOGEL PI INDICATOR 7.5 (GLOVE) ×1
GLOVE BIOGEL PI INDICATOR 8 (GLOVE) ×1
GOWN STRL REUS W/ TWL LRG LVL3 (GOWN DISPOSABLE) ×2 IMPLANT
GOWN STRL REUS W/ TWL XL LVL3 (GOWN DISPOSABLE) ×1 IMPLANT
GOWN STRL REUS W/TWL LRG LVL3 (GOWN DISPOSABLE) ×4
GOWN STRL REUS W/TWL XL LVL3 (GOWN DISPOSABLE) ×2
HALF PIN 5.0X160 (EXFIX) ×1 IMPLANT
KIT BASIN OR (CUSTOM PROCEDURE TRAY) ×2 IMPLANT
KIT TURNOVER KIT B (KITS) ×2 IMPLANT
MANIFOLD NEPTUNE II (INSTRUMENTS) ×2 IMPLANT
NEEDLE 22X1 1/2 (OR ONLY) (NEEDLE) IMPLANT
NS IRRIG 1000ML POUR BTL (IV SOLUTION) ×2 IMPLANT
PACK ORTHO EXTREMITY (CUSTOM PROCEDURE TRAY) ×2 IMPLANT
PAD ARMBOARD 7.5X6 YLW CONV (MISCELLANEOUS) ×4 IMPLANT
PAD CAST 4YDX4 CTTN HI CHSV (CAST SUPPLIES) IMPLANT
PADDING CAST COTTON 4X4 STRL (CAST SUPPLIES) ×2
PADDING CAST COTTON 6X4 STRL (CAST SUPPLIES) ×3 IMPLANT
SPONGE LAP 18X18 RF (DISPOSABLE) ×2 IMPLANT
STOCKINETTE IMPERVIOUS LG (DRAPES) IMPLANT
SUT ETHILON 2 0 FS 18 (SUTURE) ×1 IMPLANT
TOWEL GREEN STERILE (TOWEL DISPOSABLE) ×4 IMPLANT
TOWEL GREEN STERILE FF (TOWEL DISPOSABLE) ×4 IMPLANT
UNDERPAD 30X36 HEAVY ABSORB (UNDERPADS AND DIAPERS) ×2 IMPLANT

## 2020-07-27 NOTE — Transfer of Care (Signed)
Immediate Anesthesia Transfer of Care Note  Patient: Brian Atkinson  Procedure(s) Performed: Adjustment of external fixator left leg (Left Leg Lower)  Patient Location: PACU  Anesthesia Type:General  Level of Consciousness: drowsy and patient cooperative  Airway & Oxygen Therapy: Patient Spontanous Breathing and Patient connected to nasal cannula oxygen  Post-op Assessment: Report given to RN, Post -op Vital signs reviewed and stable and Patient moving all extremities  Post vital signs: Reviewed and stable  Last Vitals:  Vitals Value Taken Time  BP 143/90 07/27/20 0933  Temp    Pulse 83 07/27/20 0938  Resp 9 07/27/20 0938  SpO2 100 % 07/27/20 0938  Vitals shown include unvalidated device data.  Last Pain:  Vitals:   07/27/20 0652  TempSrc:   PainSc: 4       Patients Stated Pain Goal: 2 (07/27/20 3662)  Complications: No complications documented.

## 2020-07-27 NOTE — Anesthesia Preprocedure Evaluation (Signed)
Anesthesia Evaluation  Patient identified by MRN, date of birth, ID band Patient awake    Reviewed: Allergy & Precautions, NPO status , Patient's Chart, lab work & pertinent test results  History of Anesthesia Complications (+) history of anesthetic complications (postoperative memory issues)  Airway Mallampati: II  TM Distance: >3 FB Neck ROM: Full    Dental no notable dental hx.    Pulmonary neg pulmonary ROS,    Pulmonary exam normal breath sounds clear to auscultation       Cardiovascular negative cardio ROS Normal cardiovascular exam Rhythm:Regular Rate:Normal     Neuro/Psych negative neurological ROS  negative psych ROS   GI/Hepatic negative GI ROS, Neg liver ROS,   Endo/Other  negative endocrine ROS  Renal/GU negative Renal ROS  negative genitourinary   Musculoskeletal  (+) Arthritis , Osteoarthritis,  L tibial plateau fracture    Abdominal   Peds negative pediatric ROS (+)  Hematology  (+) Blood dyscrasia, anemia , H/H 7.4/23.9   Anesthesia Other Findings S/p fall from 92ft when hydraulics on bucket truck broke- s/p 2 units prbcs for ABLA, s/p exfix 10/15  Reproductive/Obstetrics negative OB ROS                             Anesthesia Physical Anesthesia Plan  ASA: III  Anesthesia Plan: General   Post-op Pain Management:    Induction: Intravenous  PONV Risk Score and Plan: Ondansetron, Dexamethasone and Treatment may vary due to age or medical condition  Airway Management Planned: Oral ETT  Additional Equipment: None  Intra-op Plan:   Post-operative Plan: Extubation in OR  Informed Consent: I have reviewed the patients History and Physical, chart, labs and discussed the procedure including the risks, benefits and alternatives for the proposed anesthesia with the patient or authorized representative who has indicated his/her understanding and acceptance.      Dental advisory given  Plan Discussed with: CRNA  Anesthesia Plan Comments: (Access: PIV x 1 R AC)        Anesthesia Quick Evaluation

## 2020-07-27 NOTE — Progress Notes (Signed)
Patient arrived from PACU a/o/v, no complaints voiced at this time. Will continue to monitor

## 2020-07-27 NOTE — Anesthesia Postprocedure Evaluation (Signed)
Anesthesia Post Note  Patient: Brian Atkinson  Procedure(s) Performed: Adjustment of external fixator left leg (Left Leg Lower)     Patient location during evaluation: PACU Anesthesia Type: General Level of consciousness: awake and alert, oriented and patient cooperative Pain management: pain level controlled Vital Signs Assessment: post-procedure vital signs reviewed and stable Respiratory status: spontaneous breathing, nonlabored ventilation and respiratory function stable Cardiovascular status: blood pressure returned to baseline and stable Postop Assessment: no apparent nausea or vomiting Anesthetic complications: no   No complications documented.  Last Vitals:  Vitals:   07/27/20 0945 07/27/20 1000  BP: (!) 141/76 128/76  Pulse: 93 85  Resp: 12 (!) 8  Temp:    SpO2: 99% 99%    Last Pain:  Vitals:   07/27/20 1000  TempSrc:   PainSc: Asleep                 Lannie Fields

## 2020-07-27 NOTE — Anesthesia Procedure Notes (Signed)
Procedure Name: Intubation Date/Time: 07/27/2020 8:27 AM Performed by: Leonor Liv, CRNA Pre-anesthesia Checklist: Patient identified, Emergency Drugs available, Suction available and Patient being monitored Patient Re-evaluated:Patient Re-evaluated prior to induction Oxygen Delivery Method: Circle System Utilized Preoxygenation: Pre-oxygenation with 100% oxygen Induction Type: IV induction Ventilation: Mask ventilation without difficulty Laryngoscope Size: Mac and 4 Grade View: Grade II Tube type: Oral Tube size: 7.5 mm Number of attempts: 1 Airway Equipment and Method: Stylet and Oral airway Placement Confirmation: ETT inserted through vocal cords under direct vision,  positive ETCO2 and breath sounds checked- equal and bilateral Secured at: 23 cm Tube secured with: Tape Dental Injury: Teeth and Oropharynx as per pre-operative assessment

## 2020-07-27 NOTE — Progress Notes (Signed)
Occupational therapy Treatment Note  Footplate splint fabricated. Wife/pt educated on wearing time, donning/doffing splint, and caring/cleaning of splint. Splint marked/pic taken  to help wife position correctly and use as reference. Wife able to return demonstrate and pt able to direct care. Nsg educated on use of splint. Will follow up tomorrow.     07/27/20 1643  OT Visit Information  Last OT Received On 07/27/20  Assistance Needed +1  History of Present Illness Admitted after a fall 15 ft resulting in a complex L tibial plateau fracture; now s/p knee-spanning external fixation, NWB; uerwent Adjustment of external fixator left leg 10/19.   Precautions  Precautions Fall  Precaution Comments LLE external fixator  Restrictions  Weight Bearing Restrictions Yes  LLE Weight Bearing NWB  General Comments  General comments (skin integrity, edema, etc.) FAbrication of footplate. Wife/pt educated on donning/doffing splint and wearing time. PT may remove spint as needed to complete heelcord stretches. Splint marked to help wife correctly fit splint. Picutres taken so wife could use as reference.   OT - End of Session  Activity Tolerance Patient tolerated treatment well  Patient left in bed;with call bell/phone within reach;with family/visitor present  Nurse Communication Other (comment) (care of splint/positioning)  OT Assessment  OT Recommendation/Assessment Patient needs continued OT Services  OT Visit Diagnosis Muscle weakness (generalized) (M62.81);Unsteadiness on feet (R26.81);Pain  Pain - Right/Left Left  Pain - part of body Leg  OT Problem List Decreased range of motion;Pain  OT Plan  OT Frequency (ACUTE ONLY) Min 2X/week  OT Treatment/Interventions (ACUTE ONLY) Splinting;Patient/family education  AM-PAC OT "6 Clicks" Daily Activity Outcome Measure (Version 2)  Help from another person eating meals? 4  Help from another person taking care of personal grooming? 3  Help from another  person toileting, which includes using toliet, bedpan, or urinal? 2  Help from another person bathing (including washing, rinsing, drying)? 3  Help from another person to put on and taking off regular upper body clothing? 3  Help from another person to put on and taking off regular lower body clothing? 2  6 Click Score 17  OT Recommendation  Follow Up Recommendations Follow surgeon's recommendation for DC plan and follow-up therapies  OT Equipment 3 in 1 bedside commode  Individuals Consulted  Consulted and Agree with Results and Recommendations Patient;Family member/caregiver  Family Member Consulted wife  Acute Rehab OT Goals  Patient Stated Goal to go home tomorrow  OT Goal Formulation With patient  Time For Goal Achievement 08/10/20  Potential to Achieve Goals Good  OT Time Calculation  OT Start Time (ACUTE ONLY) 1545  OT Stop Time (ACUTE ONLY) 1637  OT Time Calculation (min) 52 min  OT General Charges  $OT Visit 1 Visit  OT Treatments  $Therapeutic Activity 8-22 mins  $Orthotics Fit/Training 23-37 mins  $ Splint materials basic 1 Supply  Brian Atkinson, OT/L   Acute OT Clinical Specialist Acute Rehabilitation Services Pager [phone] Office [phone]

## 2020-07-27 NOTE — Progress Notes (Signed)
Occupational Therapy Evaluation  OT order received for footplate. Splint cart set up. Pt given stretches for dorsiflexion using sheet. Educated pt/wife on purpose of splint. Pt/wife discussing management of ADL tasks. Will ask PA for order and further address in am prior to DC. Pt will most likely benefit form use of 3in1 to help with toilet transfers.     07/27/20 1600  OT Visit Information  Last OT Received On 07/27/20  Assistance Needed +1  History of Present Illness Admitted after a fall 15 ft resulting in a complex L tibial plateau fracture; now s/p knee-spanning external fixation, NWB; uerwent Adjustment of external fixator left leg 10/19.   Precautions  Precautions Fall  Precaution Comments LLE external fixator  Restrictions  Weight Bearing Restrictions Yes  LLE Weight Bearing NWB  Home Living  Family/patient expects to be discharged to: Private residence  Pain Assessment  Pain Assessment 0-10  Pain Score 5  Pain Location L knee and thigh  Pain Descriptors / Indicators Aching;Discomfort  Pain Intervention(s) Limited activity within patient's tolerance;Repositioned;Ice applied;Patient requesting pain meds-RN notified  Cognition  Arousal/Alertness Awake/alert  Behavior During Therapy WFL for tasks assessed/performed  Overall Cognitive Status Within Functional Limits for tasks assessed  Upper Extremity Assessment  Upper Extremity Assessment Overall WFL for tasks assessed  Lower Extremity Assessment  LLE Deficits / Details L knee spanning external fixator; able to actively dorsiflex/plantarflex ankle  General Comments  General comments (skin integrity, edema, etc.) Assess LLE for footplate splint. Educated pt on use of sheet to pull foot into dorsiflexion. PT ablet o return demonstrate  OT - End of Session  Activity Tolerance Patient tolerated treatment well  Patient left in bed;with call bell/phone within reach;with family/visitor present  Nurse Communication Other  (comment) (plan for splint)  OT Assessment  OT Recommendation/Assessment Patient needs continued OT Services  OT Visit Diagnosis Muscle weakness (generalized) (M62.81);Unsteadiness on feet (R26.81);Pain  Pain - Right/Left Left  Pain - part of body Leg  OT Problem List Decreased range of motion;Pain  OT Plan  OT Frequency (ACUTE ONLY) Min 2X/week  OT Treatment/Interventions (ACUTE ONLY) Splinting;Patient/family education  AM-PAC OT "6 Clicks" Daily Activity Outcome Measure (Version 2)  Help from another person eating meals? 4  Help from another person taking care of personal grooming? 3  Help from another person toileting, which includes using toliet, bedpan, or urinal? 2  Help from another person bathing (including washing, rinsing, drying)? 3  Help from another person to put on and taking off regular upper body clothing? 3  Help from another person to put on and taking off regular lower body clothing? 2  6 Click Score 17  OT Recommendation  Follow Up Recommendations Follow surgeon's recommendation for DC plan and follow-up therapies  Individuals Consulted  Consulted and Agree with Results and Recommendations Patient;Family Adult nurse (wife)  Family Member Consulted wife  Acute Rehab OT Goals  Patient Stated Goal to go home tomorrow  OT Goal Formulation With patient  Potential to Achieve Goals Good  OT Time Calculation  OT Start Time (ACUTE ONLY) 1515  OT Stop Time (ACUTE ONLY) 1530  OT Time Calculation (min) 15 min  OT General Charges  $OT Visit 1 Visit  OT Evaluation  $OT Eval Moderate Complexity 1 Mod  Philomina Leon, OT/L   Acute OT Clinical Specialist Acute Rehabilitation Services Pager [phone] Office [phone]

## 2020-07-27 NOTE — Brief Op Note (Signed)
07/27/2020  4:25 PM  PATIENT:  Brian Atkinson  69 y.o. male  PRE-OPERATIVE DIAGNOSIS:  Left tibial plateau fracture  POST-OPERATIVE DIAGNOSIS:  Left tibial plateau fracture  PROCEDURE:  Procedure(s): 1. Revision of external fixator left leg (Left) 2. Dressing change under anesthesia  SURGEON:  Surgeon(s) and Role:    Myrene Galas, MD - Primary  PHYSICIAN ASSISTANT: 1. KEITH PAUL, PA-C, 2. PA Student   ANESTHESIA:   general  EBL:  5 mL   BLOOD ADMINISTERED:none  DRAINS: none   LOCAL MEDICATIONS USED:  NONE  SPECIMEN:  No Specimen  DISPOSITION OF SPECIMEN:  N/A  COUNTS:  YES  TOURNIQUET:  * No tourniquets in log *  DICTATION: completed.  PLAN OF CARE: Admit to inpatient   PATIENT DISPOSITION:  PACU - hemodynamically stable.   Delay start of Pharmacological VTE agent (>24hrs) due to surgical blood loss or risk of bleeding: no

## 2020-07-27 NOTE — Op Note (Signed)
NAME: Brian Atkinson, Brian Atkinson MEDICAL RECORD [id number] ACCOUNT [id number] DATE OF BIRTH:1951/05/24 FACILITY: [facility] LOCATION: [facility] PHYSICIAN:Margareth Kanner H. Jaleyah Longhi, MD  OPERATIVE REPORT  DATE OF PROCEDURE:  07/27/2020  PREOPERATIVE DIAGNOSIS:  Left tibial plateau fracture, status post external fixation with loss of reduction.  POSTOPERATIVE DIAGNOSIS:  Left tibial plateau fracture, status post external fixation with loss of reduction.  PROCEDURES:   1.  Revision of external fixation, left leg. 2.  Dressing change under anesthesia.  SURGEON:  Myrene Galas, MD  ASSISTANT: 1.  Montez Morita, PA-C. 2.  PA student.  ANESTHESIA:  General.  ESTIMATED BLOOD LOSS:  5 mL.  DRAINS:  None.  SPECIMENS:  None.  TOURNIQUET:  None.  DISPOSITION:  To PACU.  CONDITION:  Stable.Marland Kitchen  BRIEF SUMMARY OF INDICATIONS FOR PROCEDURE:  The patient a 69 year old male who fell from a tractor, sustaining a severe impacted bicondylar plateau fracture.  This was treated with emergent spanning external fixation.  The patient went on to develop  fracture blisters and postoperative x-rays showed loss of reduction from the time in the operating room with some dislocation of the lateral plateau and excessive widening.  Based off the postoperative CT scan with operative planning, I felt that the  more proximal of the tibial pins should be relocated to increase the margin of safety during later formal ORIF.  I discussed the risk of revision external fixation with the patient, including the possibility of infection, nerve injury, vessel injury,  DVT, PE, and others.  He provided consent to proceed.  BRIEF SUMMARY OF PROCEDURE:  The patient was taken to the operating room.  After administration of preoperative antibiotics, general anesthesia was induced.  The left lower extremity was prepped and draped in the usual sterile fashion.  After removal of  the dressing, we could see the fracture bullae that were rather  extensive, but not too large in any one area.  The C-arm was brought in, which confirmed again dislocation of the lateral plateau and shortening.  The external fixator was then completely  removed while leaving the superior femoral clamp.  The more proximal of the 2 tibial pins was removed.  This pin site cleaned and irrigated thoroughly and then closed loosely with 2 simple sutures using 2-0 nylon.  A more distal pin was then placed,  checking position on AP and lateral views with C-arm.  The clamp was reengaged on the tibial pins.  The bars were applied.  Pulling maximal length in a reduced or neutral rotation was then performed and the clamps secured provisionally.  The upper clamp  was tightened terminally and then the bottom clamp loosened and additional length obtained by pushing against the bars from below the clamp and then this new extended position was secured again with the wrenches.  Final images showed reduction of the  lateral plateau underneath the femur with improvement in length and overall alignment.  Dressing change was then performed over the fracture bullae while the patient remained under anesthesia using Mepitel gauze and Ace wraps from foot to thigh.  The  pins were wrapped with Kerlix gauze to prevent movement at the pin skin interface.  Montez Morita, PA-C was present during the procedure.  Also present and participating was PA student.  The patient was taken to the PACU in stable condition.  PROGNOSIS:  The patient will have soft tissue care with dressing changes over his Mepitel if the drainage should be significant.  Otherwise, we will plan to see him back in the  office for a skin check in approximately 1 week with a change at that time.   We anticipate return to the OR in 2-3 weeks, but it could be longer depending upon the progression of his swelling.  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  He will be on formal pharmacologic DVT prophylaxis.  VN/NUANCE  D:07/27/2020 T:07/27/2020 JOB:[id number]

## 2020-07-27 NOTE — Progress Notes (Signed)
Orthopedic Tech Progress Note Patient Details:  Brian Atkinson 1951-06-13 [id number] Called in order to [employer] for a FOOT PLATE Patient ID: Brian Atkinson, male   DOB: [birth year], 69 y.o.   MRN: [id number]   [name] 07/27/2020, 12:16 PM

## 2020-07-28 ENCOUNTER — Other Ambulatory Visit (HOSPITAL_COMMUNITY): Payer: Self-pay | Admitting: Orthopedic Surgery

## 2020-07-28 ENCOUNTER — Encounter (HOSPITAL_COMMUNITY): Payer: Self-pay | Admitting: Orthopedic Surgery

## 2020-07-28 DIAGNOSIS — D62 Acute posthemorrhagic anemia: Secondary | ICD-10-CM | POA: Diagnosis present

## 2020-07-28 LAB — RETICULOCYTES
Immature Retic Fract: 42.2 % — ABNORMAL HIGH (ref 2.3–15.9)
RBC.: 2.8 MIL/uL — ABNORMAL LOW (ref 4.22–5.81)
Retic Count, Absolute: 77.5 10*3/uL (ref 19.0–186.0)
Retic Ct Pct: 3.3 % — ABNORMAL HIGH (ref 0.4–3.1)

## 2020-07-28 LAB — CBC
HCT: 23 % — ABNORMAL LOW (ref 39.0–52.0)
Hemoglobin: 7.2 g/dL — ABNORMAL LOW (ref 13.0–17.0)
MCH: 26.9 pg (ref 26.0–34.0)
MCHC: 31.3 g/dL (ref 30.0–36.0)
MCV: 85.8 fL (ref 80.0–100.0)
Platelets: 280 10*3/uL (ref 150–400)
RBC: 2.68 MIL/uL — ABNORMAL LOW (ref 4.22–5.81)
RDW: 15.5 % (ref 11.5–15.5)
WBC: 14.4 10*3/uL — ABNORMAL HIGH (ref 4.0–10.5)
nRBC: 0 % (ref 0.0–0.2)

## 2020-07-28 LAB — IRON AND TIBC
Iron: 21 ug/dL — ABNORMAL LOW (ref 45–182)
Saturation Ratios: 6 % — ABNORMAL LOW (ref 17.9–39.5)
TIBC: 351 ug/dL (ref 250–450)
UIBC: 330 ug/dL

## 2020-07-28 LAB — FOLATE: Folate: 8.4 ng/mL (ref >5.9)

## 2020-07-28 LAB — VITAMIN B12: Vitamin B-12: 355 pg/mL (ref 180–914)

## 2020-07-28 LAB — FERRITIN: Ferritin: 12 ng/mL — ABNORMAL LOW (ref 24–336)

## 2020-07-28 MED ORDER — FERROUS SULFATE 325 (65 FE) MG PO TABS
325.0000 mg | ORAL_TABLET | Freq: Three times a day (TID) | ORAL | Status: DC
  Administered 2020-07-28: 325 mg via ORAL
  Filled 2020-07-28: qty 1

## 2020-07-28 MED ORDER — ACETAMINOPHEN 500 MG PO TABS: 500.0000 mg | ORAL_TABLET | Freq: Two times a day (BID) | ORAL | 0 refills | Status: DC

## 2020-07-28 MED ORDER — HYDROCODONE-ACETAMINOPHEN 5-325 MG PO TABS
1.0000 | ORAL_TABLET | Freq: Four times a day (QID) | ORAL | 0 refills | Status: DC | PRN
Start: 2020-07-28 — End: 2020-08-12

## 2020-07-28 MED ORDER — METHOCARBAMOL 500 MG PO TABS
500.0000 mg | ORAL_TABLET | Freq: Four times a day (QID) | ORAL | 1 refills | Status: DC | PRN
Start: 2020-07-28 — End: 2020-08-16

## 2020-07-28 MED ORDER — ASCORBIC ACID 1000 MG PO TABS: 1000.0000 mg | ORAL_TABLET | Freq: Every day | ORAL | 2 refills | Status: DC

## 2020-07-28 MED ORDER — FERROUS SULFATE 325 (65 FE) MG PO TABS: 325.0000 mg | ORAL_TABLET | Freq: Three times a day (TID) | ORAL | 2 refills | Status: DC

## 2020-07-28 MED ORDER — ENOXAPARIN SODIUM 40 MG/0.4ML ~~LOC~~ SOLN: 40.0000 mg | SUBCUTANEOUS | 0 refills | Status: DC

## 2020-07-28 MED ORDER — ASCORBIC ACID 500 MG PO TABS
1000.0000 mg | ORAL_TABLET | Freq: Every day | ORAL | Status: DC
  Administered 2020-07-28: 1000 mg via ORAL
  Filled 2020-07-28: qty 2

## 2020-07-28 MED FILL — ENOXAPARIN SODIUM 40 MG/0.4: 40 | 14 days supply | Qty: 6 | Fill #0

## 2020-07-28 MED FILL — FERROUS SULFATE 325 MG TAB: 325 (65 FE) | 30 days supply | Qty: 90 | Fill #0

## 2020-07-28 MED FILL — ACETAMINOPHEN 500MG XT STRE: 500 | 15 days supply | Qty: 30 | Fill #0

## 2020-07-28 MED FILL — METHOCARBAMOL 500 MG TABS: 500 | 10 days supply | Qty: 60 | Fill #0

## 2020-07-28 MED FILL — VITAMIN C 500 MG TABLET: 500 | 30 days supply | Qty: 30 | Fill #0

## 2020-07-28 MED FILL — HYDROCODON-APAP 5-325: 5-325 | 7 days supply | Qty: 50 | Fill #0

## 2020-07-28 NOTE — Progress Notes (Signed)
Physical Therapy Treatment Patient Details Name: Brian Atkinson MRN: [id number] DOB: Dec 23, 1950 Today's Date: 07/28/2020    History of Present Illness Admitted after a fall 15 ft resulting in a complex L tibial plateau fracture; now s/p knee-spanning external fixation, NWB; uerwent Adjustment of external fixator left leg 10/19.     PT Comments    Pt agreeable to therapy session, making progress with bed mobility and able to transition to/from EOB with min guard assist at most. Pt personal crutches were in room when staff entered and adjusted for proper fit. Pt instructed on safety with transfers with crutches and WB restrictions, pt able to perform sit<>stand and short gait trial in room ~5ft using crutches with Supervision/min guard, pt needing cues t/o to prevent weight bearing (pt occasionally TDWB during gait/transfers). Pt also given HEP handout ([email or web address] Access Code: [email or web address]) and verbal/visual review of therex for RLE strengthening.  Pt continues to benefit from PT services to progress toward functional mobility goals. Anticipate pt to D/C home with eventual OPPT and Supervision from family with mobility.  Follow Up Recommendations  Outpatient PT;Supervision - Intermittent (anticipate OPPT once ex-fix removed or once WB status change)     Equipment Recommendations  3in1 (PT) (pt reports he already has RW and crutches)    Recommendations for Other Services       Precautions / Restrictions Precautions Precautions: Fall Precaution Comments: LLE external fixator Restrictions Weight Bearing Restrictions: Yes LLE Weight Bearing: Non weight bearing    Mobility  Bed Mobility Overal bed mobility: Needs Assistance Bed Mobility: Supine to Sit;Sit to Supine     Supine to sit: Min guard;HOB elevated (pt able to lift ex-fix with LUE to rotate to EOB) Sit to supine: Supervision;HOB elevated   General bed mobility comments: use of bed  features  Transfers Overall transfer level: Needs assistance Equipment used: Crutches Transfers: Sit to/from Stand Sit to Stand: Min guard         General transfer comment: Cues for hand placement; good rise  Ambulation/Gait Ambulation/Gait assistance: Min guard Gait Distance (Feet): 20 Feet Assistive device: Crutches Gait Pattern/deviations:  (hop-to using crutches)   Gait velocity interpretation: <1.31 ft/sec, indicative of household ambulator General Gait Details: difficulty maintaining NWB LLE, at times placing foot on floor/TDWB, reports no dizziness this session   Stairs             Wheelchair Mobility    Modified Rankin (Stroke Patients Only)       Balance Overall balance assessment: Needs assistance Sitting-balance support: Single extremity supported;Feet supported Sitting balance-Leahy Scale: Good     Standing balance support: Bilateral upper extremity supported Standing balance-Leahy Scale: Poor Standing balance comment: at times needing to place forefoot on floor to maintain balance (TDWB) despite NWB restriction, cues for posture/activity pacing                            Cognition Arousal/Alertness: Awake/alert Behavior During Therapy: WFL for tasks assessed/performed Overall Cognitive Status: Within Functional Limits for tasks assessed                                 General Comments: some decreased awareness of safety with precautions (pt with TDWB rather than NWB on LLE despite cues)      Exercises General Exercises - Lower Extremity Ankle Circles/Pumps: AROM;Both;10 reps;Seated    General Comments  Pertinent Vitals/Pain Pain Assessment: 0-10 Pain Score: 4  (with mobility) Pain Location: L knee and thigh Pain Descriptors / Indicators: Aching;Discomfort Pain Intervention(s): Monitored during session;Repositioned;Other (comment) (offered ice but pt deferred)    Home Living                       Prior Function            PT Goals (current goals can now be found in the care plan section) Acute Rehab PT Goals Patient Stated Goal: to go home PT Goal Formulation: With patient Time For Goal Achievement: 08/09/20 Potential to Achieve Goals: Good Progress towards PT goals: Progressing toward goals    Frequency    Min 5X/week      PT Plan Current plan remains appropriate    Co-evaluation              AM-PAC PT "6 Clicks" Mobility   Outcome Measure  Help needed turning from your back to your side while in a flat bed without using bedrails?: None Help needed moving from lying on your back to sitting on the side of a flat bed without using bedrails?: A Little Help needed moving to and from a bed to a chair (including a wheelchair)?: A Little Help needed standing up from a chair using your arms (e.g., wheelchair or bedside chair)?: A Little Help needed to walk in hospital room?: A Little Help needed climbing 3-5 steps with a railing? : A Lot 6 Click Score: 18    End of Session Equipment Utilized During Treatment: Gait belt Activity Tolerance: Patient tolerated treatment well Patient left: in bed;with call bell/phone within reach;Other (comment) (OT staff member entering room) Nurse Communication: Mobility status PT Visit Diagnosis: Other abnormalities of gait and mobility (R26.89)     Time: 4944-9675 PT Time Calculation (min) (ACUTE ONLY): 21 min  Charges:  $Gait Training: 8-22 mins                     Brian Atkinson P., PTA Acute Rehabilitation Services Pager: [phone] Office: [phone]   Brian Atkinson Brian Atkinson 07/28/2020, 10:22 AM

## 2020-07-28 NOTE — Progress Notes (Signed)
Occupational Therapy Treatment Note  Completed education regarding compensatory strategies and use of AE/DME for ADL and functional transfers. Pt will benefit from use of 3 in1 - CM notified. Pt can independently manage ext fixator and direct care of footplate splint. Goals met. No further OT needed. Pt very appreciative.    07/28/20 1032  OT Visit Information  Last OT Received On 07/28/20  Assistance Needed +1  History of Present Illness Admitted after a fall 15 ft resulting in a complex L tibial plateau fracture; now s/p knee-spanning external fixation, NWB; uerwent Adjustment of external fixator left leg 10/19.   Precautions  Precautions Fall  Precaution Comments LLE external fixator  Pain Assessment  Pain Assessment 0-10  Pain Score 4  Pain Location L knee and thigh  Pain Descriptors / Indicators Aching;Discomfort  Pain Intervention(s) Limited activity within patient's tolerance  Cognition  Arousal/Alertness Awake/alert  Behavior During Therapy WFL for tasks assessed/performed  Overall Cognitive Status Within Functional Limits for tasks assessed  Upper Extremity Assessment  Upper Extremity Assessment Overall WFL for tasks assessed  Lower Extremity Assessment  Lower Extremity Assessment Defer to PT evaluation  ADL  Overall ADL's  Needs assistance/impaired  Functional mobility during ADLs Supervision/safety;Rolling walker  General ADL Comments Educated on compensatory strategies for ADL tasks and use of AE; DME. Pt able to manage LLE independently during functional transfers. Educated on use of 3in1 over the toilet to help with transfers; Educated on use of 3in1 as shower chair however pt has a "tub bench". Educated pt on use of tub bench by transferring toward strong R side then pulling LLE over tub. Per PA, pt OK to shower. Educated on pin care adn rewrapping leg with ace wrap. given handout on compression wrapping to help with edema control. Educated on continued use of ice and  elevation. Educated on compensatory techniques for bathing and dressing and use of long handled sponge and reacher as needed. Reviewed use of footplate - pt verbalized understanding.  Bed Mobility  Overal bed mobility Modified Independent  Balance  Overall balance assessment Needs assistance  Sitting balance-Leahy Scale Good  Standing balance support Bilateral upper extremity supported  Standing balance-Leahy Scale Fair  Restrictions  LLE Weight Bearing NWB  Transfers  Overall transfer level Needs assistance  Equipment used Rolling walker (2 wheeled)  Transfers Sit to/from Stand  Sit to Stand Supervision  OT - End of Session  Equipment Utilized During Treatment Rolling walker  Activity Tolerance Patient tolerated treatment well  Patient left in bed;with call bell/phone within reach  Nurse Communication Mobility status;Other (comment) (need for 3in1)  OT Assessment/Plan  OT Plan Discharge plan remains appropriate;All goals met and education completed, patient discharged from OT services  OT Visit Diagnosis Muscle weakness (generalized) (M62.81);Unsteadiness on feet (R26.81);Pain  Pain - Right/Left Left  Pain - part of body Leg  OT Frequency (ACUTE ONLY) Min 2X/week  Follow Up Recommendations Follow surgeon's recommendation for DC plan and follow-up therapies;Supervision - Intermittent  OT Equipment 3 in 1 bedside commode  AM-PAC OT "6 Clicks" Daily Activity Outcome Measure (Version 2)  Help from another person eating meals? 4  Help from another person taking care of personal grooming? 3  Help from another person toileting, which includes using toliet, bedpan, or urinal? 3  Help from another person bathing (including washing, rinsing, drying)? 3  Help from another person to put on and taking off regular upper body clothing? 3  Help from another person to put on and taking off  regular lower body clothing? 3  6 Click Score 19  OT Goal Progression  Progress towards OT goals Goals  met/education completed, patient discharged from OT  Acute Rehab OT Goals  Patient Stated Goal to go home  OT Goal Formulation With patient  ADL Goals  Pt/caregiver will Perform Home Exercise Program Increased ROM;Independently  Additional ADL Goal #1 Pt/wife will independently demonstrate ability to donn/doff footplate splint and verbalize understanding of wear schedule  OT Time Calculation  OT Start Time (ACUTE ONLY) 1005  OT Stop Time (ACUTE ONLY) 1030  OT Time Calculation (min) 25 min  OT General Charges  $OT Visit 1 Visit  OT Treatments  $Self Care/Home Management  23-37 mins  Maurie Boettcher, OT/L   Acute OT Clinical Specialist [place] Pager [phone] Office [phone]

## 2020-07-28 NOTE — Progress Notes (Signed)
Orthopaedic Trauma [facility] Progress Note  Patient ID: Brian Atkinson MRN: [id number] DOB/AGE: 07-07-1951 69 y.o.  Subjective:  Doing great Pain well controlled No complaints Wants to go home today Believes he as all DME at home already   Foot plate made yesterday, fitting well. No issues  No CP, SOB No Lightheadedness No dizziness   Review of Systems  Constitutional: Negative for chills, diaphoresis and fever.  HENT: Negative for sore throat.   Eyes: Negative for blurred vision.  Respiratory: Negative for shortness of breath and wheezing.   Cardiovascular: Negative for chest pain and palpitations.  Gastrointestinal: Negative for abdominal pain, nausea and vomiting.  Neurological: Negative for tingling and sensory change.    Objective:   VITALS:   Vitals:   07/27/20 2128 07/28/20 0042 07/28/20 0426 07/28/20 0807  BP: (!) 155/82 134/82 132/86 133/84  Pulse: (!) 109 (!) 105 (!) 105 97  Resp: 18 18 18 17   Temp: 98 F (36.7 C) 97.8 F (36.6 C) 97.6 F (36.4 C) 97.6 F (36.4 C)  TempSrc: Oral Oral Oral Oral  SpO2: 95% 96% 98% 98%  Weight:      Height:        Estimated body mass index is 29.99 kg/m as calculated from the following:   Height as of this encounter: 5\' 11"  (1.803 m).   Weight as of this encounter: 97.5 kg.   Intake/Output      10/19 0701 - 10/20 0700 10/20 0701 - 10/21 0700   P.O.     I.V. (mL/kg) 600 (6.2)    Total Intake(mL/kg) 600 (6.2)    Urine (mL/kg/hr) 1200 (0.5)    Blood 5    Total Output 1205    Net -605           LABS  Results for orders placed or performed during the hospital encounter of 07/23/20 (from the past 24 hour(s))  CBC     Status: Abnormal   Collection Time: 07/28/20  2:42 AM  Result Value Ref Range   WBC 14.4 (H) 4.0 - 10.5 K/uL   RBC 2.68 (L) 4.22 - 5.81 MIL/uL   Hemoglobin 7.2 (L) 13.0 - 17.0 g/dL   HCT 07/25/20 (L) 39 - 52 %   MCV 85.8  80.0 - 100.0 fL   MCH 26.9 26.0 - 34.0 pg   MCHC 31.3 30.0 - 36.0 g/dL   RDW 07/30/20 43.1 - 54.0 %   Platelets 280 150 - 400 K/uL   nRBC 0.0 0.0 - 0.2 %     PHYSICAL EXAM:   Gen: Resting comfortably in bed, no acute distress, pleasant and appropriate Lungs: Unlabored Cardiac: Regular rate and rhythm, S1 and S2 Abd: Soft, nontender, nondistended, + bowel sounds Ext:       Left lower extremity  External fixator is intact  Dressings including pin sites are clean, dry and intact.  Ace wrap is intact   Swelling is moderate but no further increase appreciated  Excellent active ankle range of motion noted with flexion extension inversion eversion  DPN SPN TN sensory functions intact  No pain with passive stretching  Compartments are full but soft and compressible.  No pain with palpation  + DP pulse  Extremity is warm with good perfusion distally  Knee effusion persists, hemarthrosis.  It is stable  Assessment/Plan: 1 Day Post-Op   Principal Problem:   Closed bicondylar fracture of left tibial plateau Active Problems:   Iron deficiency anemia   History of MRSA infection left patella    Acute blood loss anemia   Anti-infectives (From admission, onward)   Start     Dose/Rate Route Frequency Ordered Stop   07/27/20 0800  ceFAZolin (ANCEF) IVPB 2g/100 mL premix        2 g 200 mL/hr over 30 Minutes Intravenous  Once 07/26/20 0956 07/27/20 0818   07/24/20 0300  ceFAZolin (ANCEF) IVPB 1 g/50 mL premix        1 g 100 mL/hr over 30 Minutes Intravenous Every 6 hours 07/24/20 0032 07/24/20 1841    .  POD/HD#: 79  69 year old male fall from greater than 15 feet with complex closed left bicondylar tibial plateau fracture, history of left patella osteomyelitis (MRSA) successfully treated.  -Fall from height  -Complex closed left bicondylar tibial plateau fracture s/p external fixation  Given the amount of swelling and soft tissue injury patient will need delayed ORIF  He is stable to  discharge home today after therapies  He will follow up in the office next Wednesday for soft tissue check.  Anticipate return to the OR in 7 to 10 days.   Continue with aggressive ice and elevation, to motion and ankle motion to help with swelling resolution   Patient will begin pin care and dressing changes tomorrow, 07/29/2020.  This was reviewed with the patient.  He may get into the shower and shower with his exfix.  He will remove all the dressings prior to doing so and then redress after taking a shower.   He is nonweightbearing on his left leg for now and will be nonweightbearing for 8 weeks after definitive fixation  Footplate was fabricated to help him maintain good neutral ankle position when at rest.  He can be out of it throughout the day working on ankle range of motion.  I would recommend sleeping in it.  Routine skin checks   - Pain management:  Multimodal   Tylenol, Norco and Robaxin  - ABL anemia/Hemodynamics  Currently asymptomatic  Monitor  Will contact office if he develops symptoms  - Medical issues   No chronic issues  - DVT/PE prophylaxis:  Lovenox daily for now and then for 30 days after definitive fixation.  May transition him over to oral agent after definitive fixation  - ID:   Perioperative antibiotics complete  Close monitoring given his history of MRSA infection to this same knee.  - Metabolic Bone Disease:  Vitamin D levels look excellent.  Continue with routine supplementation  Given his age would recommend bone density scan in 4 to 8 weeks  - Activity:  Activity as tolerated while maintaining weightbearing restrictions  - FEN/GI prophylaxis/Foley/Lines:  Regular diet  DC IV and IV fluids  Bowel regimen  -Ex-fix/Splint care:  Okay to manipulate leg by fixator  Daily pin care starting 07/29/2020  - Impediments to fracture healing:  Complex fracture    - Dispo:  Discharge home today  Follow-up with orthopedics on 08/04/2020 for soft  tissue check   [name], PA-C [phone] (C) 07/28/2020, 10:01 AM  [facility] [address] [place] [address] [phone] [phone] (F)    After 5pm and on the weekends please log on to Amion, go to orthopaedics and the look under the [facility] Call for the provider(s) on call. You can  also call our office at [phone] and then follow the prompts to be connected to the call team.

## 2020-07-28 NOTE — Discharge Summary (Signed)
Orthopaedic Trauma Service (OTS) Discharge Summary   Patient ID: Brian Atkinson MRN: [id number] DOB/AGE: 68-May-1952 51 y.o.  Admit date: 07/23/2020 Discharge date: 07/28/2020  Admission Diagnoses: Closed left bicondylar tibial plateau fracture History of iron deficiency anemia History of MRSA infection of patella  Discharge Diagnoses:  Principal Problem:   Closed bicondylar fracture of left tibial plateau Active Problems:   Iron deficiency anemia   History of MRSA infection left patella    Acute blood loss anemia   Past Medical History:  Diagnosis Date  . Acute osteomyelitis of patella (HCC)   . Complication of anesthesia    post-op memory problems  . History of MRSA infection   . Patella fracture    left      Procedures Performed:  07/23/2020-Dr. Aundria Rud External fixation left bicondylar tibial plateau fracture  07/27/2020-Dr. Handy 1.  Revision of external fixation, left leg. 2.  Dressing change under anesthesia.   Discharged Condition: good  Hospital Course:   Patient is a 69 year old male well-known to the orthopedic trauma service who sustained a fall from a bucket of approximately 15 feet on 07/23/2020.  Patient was initially taken to [facility] where he was found to have a complex left bicondylar tibial plateau fracture.  He was transferred down to [facility] for further treatment.  Patient was taken to the operating room on the night of presentation for application of a spanning external fixator given the profound displacement and shortening of his fracture.  This was done by Dr. Aundria Rud.  Due to the complexity of the injury Dr. Aundria Rud asserted that definitive fixation was outside the scope of his practice and requested orthopedic trauma service consultation.  Patient is established with our practice from previous injuries.  Patient was seen and evaluated by the orthopedic trauma service on 07/26/2020.  We reviewed his imaging  studies and completed a comprehensive examination.  His soft tissue swelling precluded definitive fixation during his current hospitalization.  We felt that it would be at least a week to 10 days before we could take him back to the OR for definitive fixation as such we felt that revision of his external fixator was necessary to put him into a better position in the interim.  Patient was then taken back to the operating room on 07/27/2020 where revision of his external fixator was performed.  He was then transferred back to the PACU for recovery from anesthesia and transferred up to the orthopedic floor for observation, pain control and therapy.  Given the anticipated delay and definitive fixation we did have occupational therapy fabricate a footplate to help maintain good neutral position at rest and to prevent equinus contracture.  Patient did very well on postoperative day #1 following his revision and was requesting to be discharged home.  Patient worked well with PT and OT and stable for discharge on postoperative day #1, 07/28/2020.  Patient was started on Lovenox for DVT and PE prophylaxis on postoperative day #1 following his initial external fixator application.  This was briefly held given his acute blood loss anemia.  He was given 2 units of packed red blood cells on postoperative day #1 following his initial external fixator application.  He has not been symptomatic with respect to his anemia.  He does have chronic iron deficiency anemia.  At the time of this dictation a anemia panel is pending  Patient will be discharged on Lovenox for DVT and PE prophylaxis.  40 mg subcutaneous injection daily.  He will be continued on this after his definitive fixation as well  We did check basic bone health labs.  His vitamin D levels look fantastic.  I given his age we would recommend bone density scan in the next 8 weeks to further evaluate his bone health  Patient to follow-up with orthopedic trauma office  and a week for soft tissue check.  We anticipate return to the OR roughly 10 to 14 days from now for definitive ORIF.  Would anticipate 2 to 3-day hospital stay after that at which time we will also initiate range of motion of his knee in a hinged knee brace.  Patient did sustain a complex injury to the articular surface of his proximal tibia.  He is at increased risk for development of posttraumatic arthritis in a rapid fashion as well.  Also of note his surgical PCR screen was negative for MSSA and MRSA  Consults: None  Significant Diagnostic Studies: labs:   Results for RODERIC, LAMMERT (MRN [id number]) as of 07/28/2020 09:53  Ref. Range 07/26/2020 11:46  Vitamin D, 25-Hydroxy Latest Ref Range: 30 - 100 ng/mL 53.25   Results for ARREN, LAMINACK (MRN [id number]) as of 07/28/2020 09:53  Ref. Range 07/27/2020 03:25 07/27/2020 09:20 07/27/2020 11:42 07/28/2020 02:42  Sodium Latest Ref Range: 135 - 145 mmol/L 135     Potassium Latest Ref Range: 3.5 - 5.1 mmol/L 3.9     Chloride Latest Ref Range: 98 - 111 mmol/L 104     CO2 Latest Ref Range: 22 - 32 mmol/L 24     Glucose Latest Ref Range: 70 - 99 mg/dL 694 (H)     BUN Latest Ref Range: 8 - 23 mg/dL 12     Creatinine Latest Ref Range: 0.61 - 1.24 mg/dL 5.03     Calcium Latest Ref Range: 8.9 - 10.3 mg/dL 7.8 (L)     Anion gap Latest Ref Range: 5 - 15  7     Alkaline Phosphatase Latest Ref Range: 38 - 126 U/L 45     Albumin Latest Ref Range: 3.5 - 5.0 g/dL 2.6 (L)     AST Latest Ref Range: 15 - 41 U/L 23     ALT Latest Ref Range: 0 - 44 U/L 15     Total Protein Latest Ref Range: 6.5 - 8.1 g/dL 5.2 (L)     Total Bilirubin Latest Ref Range: 0.3 - 1.2 mg/dL <8.8 (L)     GFR, Estimated Latest Ref Range: >60 mL/min >60     WBC Latest Ref Range: 4.0 - 10.5 K/uL 9.3   14.4 (H)  RBC Latest Ref Range: 4.22 - 5.81 MIL/uL 2.78 (L)   2.68 (L)  Hemoglobin Latest Ref Range: 13.0 - 17.0 g/dL 7.4 (L)   7.2 (L)  HCT Latest Ref Range: 39 - 52 %  23.9 (L)   23.0 (L)  MCV Latest Ref Range: 80.0 - 100.0 fL 86.0   85.8  MCH Latest Ref Range: 26.0 - 34.0 pg 26.6   26.9  MCHC Latest Ref Range: 30.0 - 36.0 g/dL 82.8   00.3  RDW Latest Ref Range: 11.5 - 15.5 % 15.2   15.5  Platelets Latest Ref Range: 150 - 400 K/uL 238   280  nRBC Latest Ref Range: 0.0 - 0.2 % 0.0   0.0   Treatments: IV hydration, antibiotics: Ancef, analgesia: acetaminophen and Norco, anticoagulation: LMW heparin, therapies: PT, OT and RN, procedures: PRBC transfusion x2 and surgery: As above  Discharge Exam:     Orthopaedic Trauma Service Progress Note   Patient ID: Jacquelyne BalintVictor Charles Mcglaughlin MRN: [id number] DOB/AGE: [birth year] 69 y.o.   Subjective:   Doing great Pain well controlled No complaints Wants to go home today Believes he as all DME at home already    Foot plate made yesterday, fitting well. No issues   No CP, SOB No Lightheadedness No dizziness    Review of Systems  Constitutional: Negative for chills, diaphoresis and fever.  HENT: Negative for sore throat.   Eyes: Negative for blurred vision.  Respiratory: Negative for shortness of breath and wheezing.   Cardiovascular: Negative for chest pain and palpitations.  Gastrointestinal: Negative for abdominal pain, nausea and vomiting.  Neurological: Negative for tingling and sensory change.      Objective:    VITALS:         Vitals:    07/27/20 2128 07/28/20 0042 07/28/20 0426 07/28/20 0807  BP: (!) 155/82 134/82 132/86 133/84  Pulse: (!) 109 (!) 105 (!) 105 97  Resp: 18 18 18 17   Temp: 98 F (36.7 C) 97.8 F (36.6 C) 97.6 F (36.4 C) 97.6 F (36.4 C)  TempSrc: Oral Oral Oral Oral  SpO2: 95% 96% 98% 98%  Weight:          Height:              Estimated body mass index is 29.99 kg/m as calculated from the following:   Height as of this encounter: 5\' 11"  (1.803 m).   Weight as of this encounter: 97.5 kg.     Intake/Output      10/19 0701 - 10/20 0700 10/20 0701 - 10/21 0700   P.O.      I.V. (mL/kg) 600 (6.2)    Total Intake(mL/kg) 600 (6.2)    Urine (mL/kg/hr) 1200 (0.5)    Blood 5    Total Output 1205    Net -605            LABS   Lab Results Last 24 Hours       Results for orders placed or performed during the hospital encounter of 07/23/20 (from the past 24 hour(s))  CBC     Status: Abnormal    Collection Time: 07/28/20  2:42 AM  Result Value Ref Range    WBC 14.4 (H) 4.0 - 10.5 K/uL    RBC 2.68 (L) 4.22 - 5.81 MIL/uL    Hemoglobin 7.2 (L) 13.0 - 17.0 g/dL    HCT 40.923.0 (L) 39 - 52 %    MCV 85.8 80.0 - 100.0 fL    MCH 26.9 26.0 - 34.0 pg    MCHC 31.3 30.0 - 36.0 g/dL    RDW 81.115.5 91.411.5 - 78.215.5 %    Platelets 280 150 - 400 K/uL    nRBC 0.0 0.0 - 0.2 %          PHYSICAL EXAM:    Gen: Resting comfortably in bed, no acute distress, pleasant and appropriate Lungs: Unlabored Cardiac: Regular rate and rhythm, S1 and S2 Abd: Soft, nontender, nondistended, + bowel sounds Ext:       Left lower extremity             External fixator is intact             Dressings including pin sites are clean, dry and intact.  Ace wrap is intact              Swelling is moderate  but no further increase appreciated             Excellent active ankle range of motion noted with flexion extension inversion eversion             DPN SPN TN sensory functions intact             No pain with passive stretching             Compartments are full but soft and compressible.  No pain with palpation             + DP pulse             Extremity is warm with good perfusion distally             Knee effusion persists, hemarthrosis.  It is stable   Assessment/Plan: 1 Day Post-Op    Principal Problem:   Closed bicondylar fracture of left tibial plateau Active Problems:   Iron deficiency anemia   History of MRSA infection left patella    Acute blood loss anemia                Anti-infectives (From admission, onward)      Start     Dose/Rate Route Frequency Ordered Stop     07/27/20 0800   ceFAZolin (ANCEF) IVPB 2g/100 mL premix        2 g 200 mL/hr over 30 Minutes Intravenous  Once 07/26/20 0956 07/27/20 0818    07/24/20 0300   ceFAZolin (ANCEF) IVPB 1 g/50 mL premix        1 g 100 mL/hr over 30 Minutes Intravenous Every 6 hours 07/24/20 0032 07/24/20 1841       .   POD/HD#: 50   69 year old male fall from greater than 15 feet with complex closed left bicondylar tibial plateau fracture, history of left patella osteomyelitis (MRSA) successfully treated.   -Fall from height   -Complex closed left bicondylar tibial plateau fracture s/p external fixation             Given the amount of swelling and soft tissue injury patient will need delayed ORIF             He is stable to discharge home today after therapies             He will follow up in the office next Wednesday for soft tissue check.  Anticipate return to the OR in 7 to 10 days.                    Continue with aggressive ice and elevation, to motion and ankle motion to help with swelling resolution               Patient will begin pin care and dressing changes tomorrow, 07/29/2020.  This was reviewed with the patient.  He may get into the shower and shower with his exfix.  He will remove all the dressings prior to doing so and then redress after taking a shower.               He is nonweightbearing on his left leg for now and will be nonweightbearing for 8 weeks after definitive fixation             Footplate was fabricated to help him maintain good neutral ankle position when at rest.  He can be out of it throughout the day working on ankle range of motion.  I would  recommend sleeping in it.  Routine skin checks     - Pain management:             Multimodal                         Tylenol, Norco and Robaxin   - ABL anemia/Hemodynamics             Currently asymptomatic             Monitor             Will contact office if he develops symptoms   - Medical issues              No chronic  issues   - DVT/PE prophylaxis:             Lovenox daily for now and then for 30 days after definitive fixation.  May transition him over to oral agent after definitive fixation   - ID:              Perioperative antibiotics complete             Close monitoring given his history of MRSA infection to this same knee.   - Metabolic Bone Disease:             Vitamin D levels look excellent.  Continue with routine supplementation             Given his age would recommend bone density scan in 4 to 8 weeks   - Activity:             Activity as tolerated while maintaining weightbearing restrictions   - FEN/GI prophylaxis/Foley/Lines:             Regular diet             DC IV and IV fluids             Bowel regimen   -Ex-fix/Splint care:             Okay to manipulate leg by fixator             Daily pin care starting 07/29/2020   - Impediments to fracture healing:             Complex fracture                - Dispo:             Discharge home today             Follow-up with orthopedics on 08/04/2020 for soft tissue check     Disposition: Discharge disposition: 01-Home or Self Care       Discharge Instructions    Call MD / Call 911   Complete by: As directed    If you experience chest pain or shortness of breath, CALL 911 and be transported to the hospital emergency room.  If you develope a fever above 101 F, pus (white drainage) or increased drainage or redness at the wound, or calf pain, call your surgeon's office.   Constipation Prevention   Complete by: As directed    Drink plenty of fluids.  Prune juice may be helpful.  You may use a stool softener, such as Colace (over the counter) 100 mg twice a day.  Use MiraLax (over the counter) for constipation as needed.   Diet general   Complete by: As directed    Discharge instructions  Complete by: As directed    Orthopaedic Trauma Service Discharge Instructions   General Discharge Instructions  Orthopaedic  Injuries:  Left bicondylar tibial plateau fracture treated provisionally with external fixation. Definitive fixation TBA (approximately 7 to 10 days)  WEIGHT BEARING STATUS: Nonweightbearing left leg  RANGE OF MOTION/ACTIVITY: Ankle range of motion as tolerated. Recommend sleeping in footplate. No knee range of motion as you are in an external fixator  Bone health: Vitamin D levels look great. Would recommend setting up a bone density scan in the next 8 weeks or so to further evaluate your bone health  Wound Care: Daily external fixator and pin care starting on 07/29/2020     Discharge Pin Site Instructions  Dress pins daily with Kerlix roll starting on POD 2. Wrap the Kerlix so that it tamps the skin down around the pin-skin interface to prevent/limit motion of the skin relative to the pin.  (Pin-skin motion is the primary cause of pain and infection related to external fixator pin sites).  Remove any crust or coagulum that may obstruct drainage with soap and water.  After POD 3, if there is no discernable drainage on the pin site dressing, the interval for change can by increased to every other day.  You may shower with the fixator, cleaning all pin sites gently with soap and water.  If you have a surgical wound this needs to be completely dry and without drainage before showering. Alternatively you can use a washcloth with soap and water and gently clean the injured extremity and external fixator, including all pinsites and surgical wounds   The extremity can be lifted by the fixator to facilitate wound care and transfers.  Notify the office/Doctor if you experience increasing drainage, redness, or pain from a pin site, or if you notice purulent (thick, snot-like) drainage. As we discussed pin tract infections are common in this is most likely as a result of mechanical irritation from the skin pin interface. Primary treatment is hygiene and cleaning with soap and water. If this does not  resolve with regular cleaning contact the office  Discharge Wound Care Instructions  Do NOT apply any ointments, solutions or lotions to pin sites or surgical wounds.  These prevent needed drainage and even though solutions like hydrogen peroxide kill bacteria, they also damage cells lining the pin sites that help fight infection.  Applying lotions or ointments can keep the wounds moist and can cause them to breakdown and open up as well. This can increase the risk for infection. When in doubt call the office.  Surgical incisions should be dressed daily.  If any drainage is noted, use one layer of adaptic, then gauze, Kerlix, and an ace wrap.  Once the incision is completely dry and without drainage, it may be left open to air out.  Showering may begin 36-48 hours later.  Cleaning gently with soap and water.  Traumatic wounds should be dressed daily as well.    One layer of adaptic, gauze, Kerlix, then ace wrap.  The adaptic can be discontinued once the draining has ceased    If you have a wet to dry dressing: wet the gauze with saline the squeeze as much saline out so the gauze is moist (not soaking wet), place moistened gauze over wound, then place a dry gauze over the moist one, followed by Kerlix wrap, then ace wrap.   DVT/PE prophylaxis: Lovenox injection daily until your prescription is completed. This prescription will also cover you after your next  procedure  Diet: as you were eating previously.  Can use over the counter stool softeners and bowel preparations, such as Miralax, to help with bowel movements.  Narcotics can be constipating.  Be sure to drink plenty of fluids  PAIN MEDICATION USE AND EXPECTATIONS  You have likely been given narcotic medications to help control your pain.  After a traumatic event that results in an fracture (broken bone) with or without surgery, it is ok to use narcotic pain medications to help control one's pain.  We understand that everyone responds to  pain differently and each individual patient will be evaluated on a regular basis for the continued need for narcotic medications. Ideally, narcotic medication use should last no more than 6-8 weeks (coinciding with fracture healing).   As a patient it is your responsibility as well to monitor narcotic medication use and report the amount and frequency you use these medications when you come to your office visit.   We would also advise that if you are using narcotic medications, you should take a dose prior to therapy to maximize you participation.  IF YOU ARE ON NARCOTIC MEDICATIONS IT IS NOT PERMISSIBLE TO OPERATE A MOTOR VEHICLE (MOTORCYCLE/CAR/TRUCK/MOPED) OR HEAVY MACHINERY DO NOT MIX NARCOTICS WITH OTHER CNS (CENTRAL NERVOUS SYSTEM) DEPRESSANTS SUCH AS ALCOHOL   STOP SMOKING OR USING NICOTINE PRODUCTS!!!!  As discussed nicotine severely impairs your body's ability to heal surgical and traumatic wounds but also impairs bone healing.  Wounds and bone heal by forming microscopic blood vessels (angiogenesis) and nicotine is a vasoconstrictor (essentially, shrinks blood vessels).  Therefore, if vasoconstriction occurs to these microscopic blood vessels they essentially disappear and are unable to deliver necessary nutrients to the healing tissue.  This is one modifiable factor that you can do to dramatically increase your chances of healing your injury.    (This means no smoking, no nicotine gum, patches, etc)  DO NOT USE NONSTEROIDAL ANTI-INFLAMMATORY DRUGS (NSAID'S)  Using products such as Advil (ibuprofen), Aleve (naproxen), Motrin (ibuprofen) for additional pain control during fracture healing can delay and/or prevent the healing response.  If you would like to take over the counter (OTC) medication, Tylenol (acetaminophen) is ok.  However, some narcotic medications that are given for pain control contain acetaminophen as well. Therefore, you should not exceed more than 4000 mg of tylenol in a day  if you do not have liver disease.  Also note that there are may OTC medicines, such as cold medicines and allergy medicines that my contain tylenol as well.  If you have any questions about medications and/or interactions please ask your doctor/PA or your pharmacist.      ICE AND ELEVATE INJURED/OPERATIVE EXTREMITY  Using ice and elevating the injured extremity above your heart can help with swelling and pain control.  Icing in a pulsatile fashion, such as 20 minutes on and 20 minutes off, can be followed.    Do not place ice directly on skin. Make sure there is a barrier between to skin and the ice pack.    Using frozen items such as frozen peas works well as the conform nicely to the are that needs to be iced.  USE AN ACE WRAP OR TED HOSE FOR SWELLING CONTROL  In addition to icing and elevation, Ace wraps or TED hose are used to help limit and resolve swelling.  It is recommended to use Ace wraps or TED hose until you are informed to stop.    When using Ace Wraps start  the wrapping distally (farthest away from the body) and wrap proximally (closer to the body)   Example: If you had surgery on your leg or thing and you do not have a splint on, start the ace wrap at the toes and work your way up to the thigh        If you had surgery on your upper extremity and do not have a splint on, start the ace wrap at your fingers and work your way up to the upper arm  IF YOU ARE IN A SPLINT OR CAST DO NOT REMOVE IT FOR ANY REASON   If your splint gets wet for any reason please contact the office immediately. You may shower in your splint or cast as long as you keep it dry.  This can be done by wrapping in a cast cover or garbage back (or similar)  Do Not stick any thing down your splint or cast such as pencils, money, or hangers to try and scratch yourself with.  If you feel itchy take benadryl as prescribed on the bottle for itching  IF YOU ARE IN A CAM BOOT (BLACK BOOT)  You may remove boot periodically.  Perform daily dressing changes as noted below.  Wash the liner of the boot regularly and wear a sock when wearing the boot. It is recommended that you sleep in the boot until told otherwise    Call office for the following: Temperature greater than 101F Persistent nausea and vomiting Severe uncontrolled pain Redness, tenderness, or signs of infection (pain, swelling, redness, odor or green/yellow discharge around the site) Difficulty breathing, headache or visual disturbances Hives Persistent dizziness or light-headedness Extreme fatigue Any other questions or concerns you may have after discharge  In an emergency, call 911 or go to an Emergency Department at a nearby hospital  HELPFUL INFORMATION   You should wean off your narcotic medicines as soon as you are able.  Most patients will be off or using minimal narcotics before their first postop appointment.   Do not drink alcoholic beverages or take illicit drugs when taking pain medications.  In most states it is against the law to drive while you are in a splint or sling.  And certainly against the law to drive while taking narcotics.  Pain medication may make you constipated.  Below are a few solutions to try in this order: Decrease the amount of pain medication if you aren't having pain. Drink lots of decaffeinated fluids. Drink prune juice and/or each dried prunes  If the first 3 don't work start with additional solutions Take Colace - an over-the-counter stool softener Take Senokot - an over-the-counter laxative Take Miralax - a stronger over-the-counter laxative     CALL THE OFFICE WITH ANY QUESTIONS OR CONCERNS: [phone]   VISIT OUR WEBSITE FOR ADDITIONAL INFORMATION: [email or web address]   Driving restrictions   Complete by: As directed    No driving   Increase activity slowly as tolerated   Complete by: As directed    Non weight bearing   Complete by: As directed    Laterality: left   Extremity: Lower    Patient may shower   Complete by: As directed    You may shower. Remove external fixator dressings and let soapy water run over the external fixator. Dry well and then reapply dressings per instructions and how we reviewed in the hospital     Allergies as of 07/28/2020      Reactions   Versed [midazolam]  Memory problems      Medication List    STOP taking these medications   docusate sodium 100 MG capsule Commonly known as: COLACE   oxyCODONE-acetaminophen 5-325 MG tablet Commonly known as: Roxicet     TAKE these medications   acetaminophen 500 MG tablet Commonly known as: TYLENOL Take 1 tablet (500 mg total) by mouth every 12 (twelve) hours. What changed:   when to take this  reasons to take this   ascorbic acid 1000 MG tablet Commonly known as: VITAMIN C Take 1 tablet (1,000 mg total) by mouth daily.   enoxaparin 40 MG/0.4ML injection Commonly known as: LOVENOX Inject 0.4 mLs (40 mg total) into the skin daily.   ferrous sulfate 325 (65 FE) MG tablet Take 1 tablet (325 mg total) by mouth 3 (three) times daily with meals.   HYDROcodone-acetaminophen 5-325 MG tablet Commonly known as: NORCO/VICODIN Take 1-2 tablets by mouth every 6 (six) hours as needed for moderate pain or severe pain.   methocarbamol 500 MG tablet Commonly known as: ROBAXIN Take 1-2 tablets (500-1,000 mg total) by mouth every 6 (six) hours as needed for muscle spasms. What changed:   medication strength  how much to take  when to take this   MULTIVITAMIN ADULT PO Take 1 tablet by mouth daily.            Discharge Care Instructions  (From admission, onward)         Start     Ordered   07/28/20 0000  Non weight bearing       Question Answer Comment  Laterality left   Extremity Lower      07/28/20 1022          Follow-up Information    Myrene Galas, MD. Schedule an appointment as soon as possible for a visit on 08/04/2020.   Specialty: Orthopedic Surgery Contact  information: [address] [place] [address] [phone]               Discharge Instructions and Plan:  69 year old male s/p fall from a height greater than 15 feet with complex close left bicondylar tibial plateau fracture. 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 Okay to shower and clean external fixator and pin sites with soap and water Orthopedic device(s): Walker and crutches. Wheelchair if needed Showering: Okay to shower and clean external fixator and pin sites with soap and water VTE prophylaxis: Lovenox  qd 30 days Pain control: Multimodal analgesia with Tylenol, Norco and Robaxin Bone Health/Optimization: Continue with home regimen. Do recommend bone density scan in the next 8 weeks Follow - up plan: Follow-up on 08/04/2020 for soft tissue check Contact information:  Myrene Galas MD, Montez Morita PA-C   Signed:  [name], PA-C [phone] Salena Saner) 07/28/2020, 10:22 AM  [facility] [address] [place] [address] [phone] (F)

## 2020-07-28 NOTE — Plan of Care (Signed)
  Problem: Education: Goal: Knowledge of General Education information will improve Description: Including pain rating scale, medication(s)/side effects and non-pharmacologic comfort measures Outcome: Progressing   Problem: Clinical Measurements: Goal: Will remain free from infection Outcome: Progressing   Problem: Pain Managment: Goal: General experience of comfort will improve Outcome: Progressing   

## 2020-07-28 NOTE — TOC Transition Note (Signed)
Transition of Care [facility]) - CM/SW Discharge Note   Patient Details  Name: Brian Atkinson MRN: [id number] Date of Birth: Nov 22, 1950  Transition of Care [facility]) CM/SW Contact:  Curlene Labrum, RN Phone Number: 07/28/2020, 12:25 PM   Clinical Narrative:    Case management met with the patient regarding transitions of care to home.  The patient was provided with a 3:1 from [employer] to go home with today.  He has all other needed dme at home.  The patient plans to follow up with Dr. Marcelino Scot for further surgery.  He is being discharged home today by his daughter.  Patient has no other needs for discharge after receiving his medications from the [facility] in the room.   Final next level of care: Home/Self Care Barriers to Discharge: No Barriers Identified   Patient Goals and CMS Choice Patient states their goals for this hospitalization and ongoing recovery are:: Patient plans to discharge home with family. CMS Medicare.gov Compare Post Acute Care list provided to:: Patient Choice offered to / list presented to : Patient  Discharge Placement                       Discharge Plan and Services   Discharge Planning Services: CM Consult Post Acute Care Choice: Durable Medical Equipment          DME Arranged: 3-N-1                    Social Determinants of Health (SDOH) Interventions     Readmission Risk Interventions Readmission Risk Prevention Plan 07/28/2020  Post Dischage Appt Complete  Medication Screening Complete  Transportation Screening Complete  Some recent data might be hidden

## 2020-07-28 NOTE — Care Management Important Message (Signed)
Important Message  Patient Details  Name: Brian Atkinson MRN: [id number] Date of Birth: 1951-02-24   Medicare Important Message Given:  Yes - Important Message mailed due to current National Emergency  Verbal consent obtained due to current National Emergency  Relationship to patient: Self Contact Name: Calen Geister Call Date: 07/28/20  Time: 1234 Phone: [phone] Outcome: Spoke with contact Important Message mailed to: Patient address on file    Orson Aloe 07/28/2020, 12:35 PM

## 2020-07-28 NOTE — Progress Notes (Signed)
SWOT  Nurse provided discharge summary packet to pt with instructions. Per SWOT nurse pt verbalized understanding of instructions with no complaints voiced. Pt D/C to home as ordered. Spouse will be resposible for the pt's ride back home. Pt remains alert/oriented in no apparent distress.

## 2020-07-28 NOTE — TOC CAGE-AID Note (Signed)
Transition of Care [facility]) - CAGE-AID Screening   Patient Details  Name: Brian Atkinson MRN: [id number] Date of Birth: [birth year]  Transition [facility]) CM/SW Contact:    Emeterio Reeve, [place] Phone Number: 07/28/2020, 4:15 PM   Clinical Narrative:  CSW met with pt at bedside. CSW introduced self and explained role at the hospital.  Pt denies alcohol use. Pt denies substance use. Pt did not need any resources at this time.    CAGE-AID Screening:    Have You Ever Felt You Ought to Cut Down on Your Drinking or Drug Use?: No Have People Annoyed You By Critizing Your Drinking Or Drug Use?: No Have You Felt Bad Or Guilty About Your Drinking Or Drug Use?: No Have You Ever Had a Drink or Used Drugs First Thing In The Morning to Steady Your Nerves or to Get Rid of a Hangover?: No CAGE-AID Score: 0  Substance Abuse Education Offered: Yes      Blima Ledger, [place] Social Worker [phone]

## 2020-08-11 ENCOUNTER — Other Ambulatory Visit: Payer: Self-pay

## 2020-08-11 ENCOUNTER — Encounter (HOSPITAL_COMMUNITY): Payer: Self-pay | Admitting: Orthopedic Surgery

## 2020-08-11 NOTE — Progress Notes (Signed)
Same Day Work-Up Phone Call:  PCP - Nettie Elm [facility], in [place], [place]. Cardiologist - Denies  PPM/ICD - Denies  Chest x-ray - 07/23/20 EKG - 07/23/20 Stress Test - Per patient, > 5 years ago; unsure reason for test, does not remember who did it or where he did it; did not have to follow up with cardiologist. ECHO - Denies Cardiac Cath - Denies  Sleep Study - Denies  Patient denies being diabetic.  Blood Thinner Instructions: Lovenox: Per pt, continue until 08/11/20. None on 08/12/20 Aspirin Instructions: N/A  ERAS Protcol - Yes PRE-SURGERY Ensure or G2- Not ordered  COVID TEST- DOS   Anesthesia review: No  -----------------------------------------------------------------------  Patient Instructions:   Your procedure is scheduled on Thursday, November 4.  Report to [facility] Main Entrance "A" at 05:30 A.M., and check in at the Admitting office.  Call this number if you have problems the morning of surgery:  [phone]     Remember:  Do not eat after midnight the night before your surgery.  You may drink clear liquids until 05:30 AM the morning of your surgery.   Clear liquids allowed are: Water, Non-Citrus Juices (without pulp), Carbonated Beverages, Clear Tea, Black Coffee Only, and Gatorade.    Take these medicines the morning of surgery with A SIP OF WATER:  IF NEEDED: acetaminophen (TYLENOL) loratadine (CLARITIN) methocarbamol (ROBAXIN) oxyCODONE-acetaminophen (PERCOCET/ROXICET)   *Follow your surgeon's instructions on when to stop enoxaparin (LOVENOX).  If no instructions were given by your surgeon then you will need to call the office to get those instructions.     As of today, STOP taking any Aspirin (unless otherwise instructed by your surgeon) Aleve, Naproxen, Ibuprofen, Motrin, Advil, Goody's, BC's, all herbal medications, fish oil, and all vitamins.  Day of Surgery: Shower Wear Clean/Comfortable clothing the morning of  surgery Do not apply any deodorants/lotions.               Do not wear jewelry             Remember to brush your teeth WITH YOUR REGULAR TOOTHPASTE.             Men may shave face and neck.             Do not bring valuables to the hospital.             [facility] is not responsible for any belongings or valuables.    Do NOT Smoke (Tobacco/Vaping) or drink Alcohol 24 hours prior to your procedure.  If you use a CPAP at night, you may bring all equipment for your overnight stay.   Contacts, glasses, dentures or bridgework may not be worn into surgery.      Patients discharged the day of surgery will not be allowed to drive home, and someone needs to stay with them for 24 hours.

## 2020-08-12 ENCOUNTER — Encounter (HOSPITAL_COMMUNITY): Payer: Self-pay | Admitting: Orthopedic Surgery

## 2020-08-12 ENCOUNTER — Inpatient Hospital Stay (HOSPITAL_COMMUNITY)
Admission: RE | Admit: 2020-08-12 | Discharge: 2020-08-16 | DRG: 488 | Disposition: A | Discharge status: Home / Self Care | Payer: Medicare Other | Attending: Orthopedic Surgery | Admitting: Orthopedic Surgery

## 2020-08-12 ENCOUNTER — Inpatient Hospital Stay (HOSPITAL_COMMUNITY): Payer: Medicare Other | Admitting: Certified Registered Nurse Anesthetist

## 2020-08-12 ENCOUNTER — Encounter (HOSPITAL_COMMUNITY)
Admission: RE | Disposition: A | Discharge status: Home / Self Care | Payer: Self-pay | Source: Home / Self Care | Attending: Orthopedic Surgery

## 2020-08-12 ENCOUNTER — Other Ambulatory Visit: Payer: Self-pay

## 2020-08-12 ENCOUNTER — Inpatient Hospital Stay (HOSPITAL_COMMUNITY): Payer: Medicare Other

## 2020-08-12 DIAGNOSIS — Z961 Presence of intraocular lens: Secondary | ICD-10-CM | POA: Diagnosis present

## 2020-08-12 DIAGNOSIS — S82112A Displaced fracture of left tibial spine, initial encounter for closed fracture: Secondary | ICD-10-CM | POA: Diagnosis present

## 2020-08-12 DIAGNOSIS — Z6832 Body mass index (BMI) 32.0-32.9, adult: Secondary | ICD-10-CM | POA: Diagnosis not present

## 2020-08-12 DIAGNOSIS — E669 Obesity, unspecified: Secondary | ICD-10-CM | POA: Diagnosis present

## 2020-08-12 DIAGNOSIS — Z885 Allergy status to narcotic agent status: Secondary | ICD-10-CM

## 2020-08-12 DIAGNOSIS — W3089XA Contact with other specified agricultural machinery, initial encounter: Secondary | ICD-10-CM

## 2020-08-12 DIAGNOSIS — S83282A Other tear of lateral meniscus, current injury, left knee, initial encounter: Secondary | ICD-10-CM | POA: Diagnosis present

## 2020-08-12 DIAGNOSIS — D62 Acute posthemorrhagic anemia: Secondary | ICD-10-CM | POA: Diagnosis not present

## 2020-08-12 DIAGNOSIS — E8889 Other specified metabolic disorders: Secondary | ICD-10-CM | POA: Diagnosis present

## 2020-08-12 DIAGNOSIS — T148XXA Other injury of unspecified body region, initial encounter: Secondary | ICD-10-CM

## 2020-08-12 DIAGNOSIS — Z8614 Personal history of Methicillin resistant Staphylococcus aureus infection: Secondary | ICD-10-CM | POA: Diagnosis not present

## 2020-08-12 DIAGNOSIS — Y9279 Other farm location as the place of occurrence of the external cause: Secondary | ICD-10-CM

## 2020-08-12 DIAGNOSIS — Z9842 Cataract extraction status, left eye: Secondary | ICD-10-CM | POA: Diagnosis not present

## 2020-08-12 DIAGNOSIS — Z9841 Cataract extraction status, right eye: Secondary | ICD-10-CM | POA: Diagnosis not present

## 2020-08-12 DIAGNOSIS — S82142A Displaced bicondylar fracture of left tibia, initial encounter for closed fracture: Principal | ICD-10-CM | POA: Diagnosis present

## 2020-08-12 DIAGNOSIS — Z20822 Contact with and (suspected) exposure to covid-19: Secondary | ICD-10-CM | POA: Diagnosis present

## 2020-08-12 DIAGNOSIS — Z419 Encounter for procedure for purposes other than remedying health state, unspecified: Secondary | ICD-10-CM

## 2020-08-12 HISTORY — PX: EXTERNAL FIXATION REMOVAL: SHX5040

## 2020-08-12 HISTORY — PX: ORIF TIBIA PLATEAU: SHX2132

## 2020-08-12 LAB — COMPREHENSIVE METABOLIC PANEL
ALT: 17 U/L (ref 0–44)
AST: 17 U/L (ref 15–41)
Albumin: 3.2 g/dL — ABNORMAL LOW (ref 3.5–5.0)
Alkaline Phosphatase: 90 U/L (ref 38–126)
Anion gap: 8 (ref 5–15)
BUN: 15 mg/dL (ref 8–23)
CO2: 27 mmol/L (ref 22–32)
Calcium: 8.8 mg/dL — ABNORMAL LOW (ref 8.9–10.3)
Chloride: 104 mmol/L (ref 98–111)
Creatinine, Ser: 0.91 mg/dL (ref 0.61–1.24)
GFR, Estimated: >60 mL/min (ref >60)
Glucose, Bld: 112 mg/dL — ABNORMAL HIGH (ref 70–99)
Potassium: 4.6 mmol/L (ref 3.5–5.1)
Sodium: 139 mmol/L (ref 135–145)
Total Bilirubin: 0.4 mg/dL (ref 0.3–1.2)
Total Protein: 6.5 g/dL (ref 6.5–8.1)

## 2020-08-12 LAB — CBC WITH DIFFERENTIAL/PLATELET
Abs Immature Granulocytes: 0.04 10*3/uL (ref 0.00–0.07)
Basophils Absolute: 0.1 10*3/uL (ref 0.0–0.1)
Basophils Relative: 1 %
Eosinophils Absolute: 0.2 10*3/uL (ref 0.0–0.5)
Eosinophils Relative: 2 %
HCT: 32.6 % — ABNORMAL LOW (ref 39.0–52.0)
Hemoglobin: 9.7 g/dL — ABNORMAL LOW (ref 13.0–17.0)
Immature Granulocytes: 1 %
Lymphocytes Relative: 17 %
Lymphs Abs: 1.3 10*3/uL (ref 0.7–4.0)
MCH: 26.3 pg (ref 26.0–34.0)
MCHC: 29.8 g/dL — ABNORMAL LOW (ref 30.0–36.0)
MCV: 88.3 fL (ref 80.0–100.0)
Monocytes Absolute: 0.9 10*3/uL (ref 0.1–1.0)
Monocytes Relative: 11 %
Neutro Abs: 5.2 10*3/uL (ref 1.7–7.7)
Neutrophils Relative %: 68 %
Platelets: 467 10*3/uL — ABNORMAL HIGH (ref 150–400)
RBC: 3.69 MIL/uL — ABNORMAL LOW (ref 4.22–5.81)
RDW: 16.6 % — ABNORMAL HIGH (ref 11.5–15.5)
WBC: 7.6 10*3/uL (ref 4.0–10.5)
nRBC: 0 % (ref 0.0–0.2)

## 2020-08-12 LAB — VITAMIN D 25 HYDROXY (VIT D DEFICIENCY, FRACTURES): Vit D, 25-Hydroxy: 73.99 ng/mL (ref 30–100)

## 2020-08-12 LAB — APTT: aPTT: 30 seconds (ref 24–36)

## 2020-08-12 LAB — PROTIME-INR
INR: 1.1 (ref 0.8–1.2)
Prothrombin Time: 13.6 seconds (ref 11.4–15.2)

## 2020-08-12 LAB — SARS CORONAVIRUS 2 BY RT PCR (HOSPITAL ORDER, PERFORMED IN ~~LOC~~ HOSPITAL LAB): SARS Coronavirus 2: NEGATIVE

## 2020-08-12 LAB — PREPARE RBC (CROSSMATCH)

## 2020-08-12 SURGERY — OPEN REDUCTION INTERNAL FIXATION (ORIF) TIBIAL PLATEAU
Anesthesia: General | Laterality: Left

## 2020-08-12 MED ORDER — VITAMIN D 25 MCG (1000 UNIT) PO TABS
2000.0000 [IU] | ORAL_TABLET | Freq: Every day | ORAL | Status: DC
  Administered 2020-08-12 – 2020-08-16 (×5): 2000 [IU] via ORAL
  Filled 2020-08-12 (×5): qty 2

## 2020-08-12 MED ORDER — PHENYLEPHRINE 40 MCG/ML (10ML) SYRINGE FOR IV PUSH (FOR BLOOD PRESSURE SUPPORT)
PREFILLED_SYRINGE | INTRAVENOUS | Status: AC
  Filled 2020-08-12: qty 10

## 2020-08-12 MED ORDER — ASCORBIC ACID 500 MG PO TABS
1000.0000 mg | ORAL_TABLET | Freq: Every day | ORAL | Status: DC
  Administered 2020-08-12 – 2020-08-16 (×5): 1000 mg via ORAL
  Filled 2020-08-12 (×5): qty 2

## 2020-08-12 MED ORDER — TRAMADOL HCL 50 MG PO TABS: 50.0000 mg | ORAL_TABLET | Freq: Three times a day (TID) | ORAL | Status: DC | PRN

## 2020-08-12 MED ORDER — LIDOCAINE 2% (20 MG/ML) 5 ML SYRINGE
INTRAMUSCULAR | Status: DC | PRN
  Administered 2020-08-12: 20 mg via INTRAVENOUS

## 2020-08-12 MED ORDER — LACTATED RINGERS IV SOLN: INTRAVENOUS | Status: DC | PRN

## 2020-08-12 MED ORDER — ORAL CARE MOUTH RINSE: 15.0000 mL | Freq: Once | OROMUCOSAL | Status: AC

## 2020-08-12 MED ORDER — LACTATED RINGERS IV SOLN: INTRAVENOUS | Status: DC

## 2020-08-12 MED ORDER — ACETAMINOPHEN 500 MG PO TABS: 1000.0000 mg | ORAL_TABLET | Freq: Once | ORAL | Status: DC

## 2020-08-12 MED ORDER — CHLORHEXIDINE GLUCONATE 0.12 % MT SOLN: 15.0000 mL | Freq: Once | OROMUCOSAL | Status: AC

## 2020-08-12 MED ORDER — HYDROMORPHONE HCL 1 MG/ML IJ SOLN
INTRAMUSCULAR | Status: DC | PRN
Start: 2020-08-12 — End: 2020-08-12
  Administered 2020-08-12 (×4): .25 mg via INTRAVENOUS

## 2020-08-12 MED ORDER — METOPROLOL TARTRATE 5 MG/5ML IV SOLN
INTRAVENOUS | Status: AC
  Filled 2020-08-12: qty 5

## 2020-08-12 MED ORDER — POVIDONE-IODINE 10 % EX SWAB
2.0000 "application " | Freq: Once | CUTANEOUS | Status: AC
  Administered 2020-08-12: 2 via TOPICAL

## 2020-08-12 MED ORDER — HYDROMORPHONE HCL 1 MG/ML IJ SOLN
INTRAMUSCULAR | Status: AC
  Filled 2020-08-12: qty 1

## 2020-08-12 MED ORDER — HYDROMORPHONE HCL 1 MG/ML IJ SOLN
0.5000 mg | INTRAMUSCULAR | Status: DC | PRN
  Administered 2020-08-12 (×3): 0.5 mg via INTRAVENOUS

## 2020-08-12 MED ORDER — MIDAZOLAM HCL 2 MG/2ML IJ SOLN
INTRAMUSCULAR | Status: AC
  Filled 2020-08-12: qty 2

## 2020-08-12 MED ORDER — ACETAMINOPHEN 500 MG PO TABS
1000.0000 mg | ORAL_TABLET | Freq: Three times a day (TID) | ORAL | Status: DC
  Administered 2020-08-12 – 2020-08-16 (×12): 1000 mg via ORAL
  Filled 2020-08-12 (×12): qty 2

## 2020-08-12 MED ORDER — METOPROLOL TARTRATE 5 MG/5ML IV SOLN
INTRAVENOUS | Status: DC | PRN
  Administered 2020-08-12 (×2): 1 mg via INTRAVENOUS

## 2020-08-12 MED ORDER — HYDROMORPHONE HCL 1 MG/ML IJ SOLN
INTRAMUSCULAR | Status: AC
  Filled 2020-08-12: qty 0.5

## 2020-08-12 MED ORDER — SODIUM CHLORIDE 0.9% IV SOLUTION: Freq: Once | INTRAVENOUS | Status: DC

## 2020-08-12 MED ORDER — ACETAMINOPHEN 325 MG PO TABS: 325.0000 mg | ORAL_TABLET | Freq: Four times a day (QID) | ORAL | Status: DC | PRN

## 2020-08-12 MED ORDER — PROPOFOL 10 MG/ML IV BOLUS
INTRAVENOUS | Status: DC | PRN
  Administered 2020-08-12: 50 mg via INTRAVENOUS
  Administered 2020-08-12: 200 mg via INTRAVENOUS
  Administered 2020-08-12: 50 mg via INTRAVENOUS

## 2020-08-12 MED ORDER — ACETAMINOPHEN 500 MG PO TABS: 1000.0000 mg | ORAL_TABLET | Freq: Once | ORAL | Status: AC

## 2020-08-12 MED ORDER — FENTANYL CITRATE (PF) 250 MCG/5ML IJ SOLN
INTRAMUSCULAR | Status: AC
  Filled 2020-08-12: qty 5

## 2020-08-12 MED ORDER — PROMETHAZINE HCL 25 MG/ML IJ SOLN: 6.2500 mg | INTRAMUSCULAR | Status: DC | PRN

## 2020-08-12 MED ORDER — BISACODYL 5 MG PO TBEC: 5.0000 mg | DELAYED_RELEASE_TABLET | Freq: Every day | ORAL | Status: DC | PRN

## 2020-08-12 MED ORDER — ALBUMIN HUMAN 5 % IV SOLN: INTRAVENOUS | Status: DC | PRN

## 2020-08-12 MED ORDER — OXYCODONE HCL 5 MG/5ML PO SOLN: 5.0000 mg | Freq: Once | ORAL | Status: DC | PRN

## 2020-08-12 MED ORDER — CEFAZOLIN SODIUM-DEXTROSE 2-4 GM/100ML-% IV SOLN
2.0000 g | Freq: Four times a day (QID) | INTRAVENOUS | Status: AC
  Administered 2020-08-12 – 2020-08-13 (×3): 2 g via INTRAVENOUS
  Filled 2020-08-12 (×2): qty 100

## 2020-08-12 MED ORDER — CHLORHEXIDINE GLUCONATE 4 % EX LIQD: 60.0000 mL | Freq: Once | CUTANEOUS | Status: DC

## 2020-08-12 MED ORDER — CEFAZOLIN SODIUM-DEXTROSE 2-4 GM/100ML-% IV SOLN
2.0000 g | INTRAVENOUS | Status: AC
  Administered 2020-08-12: 2 g via INTRAVENOUS

## 2020-08-12 MED ORDER — ROCURONIUM BROMIDE 10 MG/ML (PF) SYRINGE
PREFILLED_SYRINGE | INTRAVENOUS | Status: AC
  Filled 2020-08-12: qty 10

## 2020-08-12 MED ORDER — HYDROMORPHONE HCL 1 MG/ML IJ SOLN
INTRAMUSCULAR | Status: AC
  Administered 2020-08-12: 0.5 mg
  Filled 2020-08-12: qty 1

## 2020-08-12 MED ORDER — DEXAMETHASONE SODIUM PHOSPHATE 10 MG/ML IJ SOLN
INTRAMUSCULAR | Status: AC
  Filled 2020-08-12: qty 1

## 2020-08-12 MED ORDER — METHOCARBAMOL 500 MG PO TABS
1000.0000 mg | ORAL_TABLET | Freq: Three times a day (TID) | ORAL | Status: DC
  Administered 2020-08-12 – 2020-08-16 (×11): 1000 mg via ORAL
  Filled 2020-08-12 (×11): qty 2

## 2020-08-12 MED ORDER — FENTANYL CITRATE (PF) 250 MCG/5ML IJ SOLN
INTRAMUSCULAR | Status: DC | PRN
  Administered 2020-08-12 (×2): 50 ug via INTRAVENOUS
  Administered 2020-08-12: 150 ug via INTRAVENOUS

## 2020-08-12 MED ORDER — METOCLOPRAMIDE HCL 5 MG PO TABS: 5.0000 mg | ORAL_TABLET | Freq: Three times a day (TID) | ORAL | Status: DC | PRN

## 2020-08-12 MED ORDER — ACETAMINOPHEN 500 MG PO TABS
ORAL_TABLET | ORAL | Status: AC
  Administered 2020-08-12: 1000 mg via ORAL
  Filled 2020-08-12: qty 2

## 2020-08-12 MED ORDER — TRANEXAMIC ACID-NACL 1000-0.7 MG/100ML-% IV SOLN
INTRAVENOUS | Status: DC | PRN
  Administered 2020-08-12: 1000 mg via INTRAVENOUS

## 2020-08-12 MED ORDER — OXYCODONE HCL 5 MG PO TABS: 5.0000 mg | ORAL_TABLET | Freq: Once | ORAL | Status: DC | PRN

## 2020-08-12 MED ORDER — PHENYLEPHRINE 40 MCG/ML (10ML) SYRINGE FOR IV PUSH (FOR BLOOD PRESSURE SUPPORT)
PREFILLED_SYRINGE | INTRAVENOUS | Status: DC | PRN
  Administered 2020-08-12: 80 ug via INTRAVENOUS
  Administered 2020-08-12: 40 ug via INTRAVENOUS

## 2020-08-12 MED ORDER — FERROUS SULFATE 325 (65 FE) MG PO TABS
325.0000 mg | ORAL_TABLET | Freq: Three times a day (TID) | ORAL | Status: DC
  Administered 2020-08-12 – 2020-08-14 (×7): 325 mg via ORAL
  Filled 2020-08-12 (×8): qty 1

## 2020-08-12 MED ORDER — ROCURONIUM BROMIDE 10 MG/ML (PF) SYRINGE
PREFILLED_SYRINGE | INTRAVENOUS | Status: DC | PRN
  Administered 2020-08-12 (×2): 20 mg via INTRAVENOUS
  Administered 2020-08-12: 60 mg via INTRAVENOUS
  Administered 2020-08-12: 40 mg via INTRAVENOUS
  Administered 2020-08-12 (×2): 10 mg via INTRAVENOUS

## 2020-08-12 MED ORDER — MAGNESIUM CITRATE PO SOLN: 1.0000 | Freq: Once | ORAL | Status: DC | PRN

## 2020-08-12 MED ORDER — MEPERIDINE HCL 25 MG/ML IJ SOLN: 6.2500 mg | INTRAMUSCULAR | Status: DC | PRN

## 2020-08-12 MED ORDER — ONDANSETRON HCL 4 MG PO TABS
4.0000 mg | ORAL_TABLET | Freq: Four times a day (QID) | ORAL | Status: DC | PRN
  Filled 2020-08-12: qty 1

## 2020-08-12 MED ORDER — DOCUSATE SODIUM 100 MG PO CAPS
100.0000 mg | ORAL_CAPSULE | Freq: Two times a day (BID) | ORAL | Status: DC
  Administered 2020-08-12 – 2020-08-14 (×5): 100 mg via ORAL
  Filled 2020-08-12 (×6): qty 1

## 2020-08-12 MED ORDER — CEFAZOLIN SODIUM-DEXTROSE 2-4 GM/100ML-% IV SOLN
INTRAVENOUS | Status: AC
  Filled 2020-08-12: qty 100

## 2020-08-12 MED ORDER — VANCOMYCIN HCL 1000 MG IV SOLR
INTRAVENOUS | Status: AC
  Filled 2020-08-12: qty 1000

## 2020-08-12 MED ORDER — HYDROMORPHONE HCL 1 MG/ML IJ SOLN
0.2500 mg | INTRAMUSCULAR | Status: DC | PRN
  Administered 2020-08-12 (×2): 0.5 mg via INTRAVENOUS

## 2020-08-12 MED ORDER — RIVAROXABAN 10 MG PO TABS
10.0000 mg | ORAL_TABLET | Freq: Every day | ORAL | Status: DC
  Administered 2020-08-12 – 2020-08-16 (×5): 10 mg via ORAL
  Filled 2020-08-12 (×5): qty 1

## 2020-08-12 MED ORDER — METHOCARBAMOL 1000 MG/10ML IJ SOLN
1000.0000 mg | Freq: Three times a day (TID) | INTRAVENOUS | Status: DC
  Filled 2020-08-12: qty 10

## 2020-08-12 MED ORDER — DEXAMETHASONE SODIUM PHOSPHATE 10 MG/ML IJ SOLN
INTRAMUSCULAR | Status: DC | PRN
  Administered 2020-08-12: 10 mg via INTRAVENOUS

## 2020-08-12 MED ORDER — PROPOFOL 10 MG/ML IV BOLUS
INTRAVENOUS | Status: AC
  Filled 2020-08-12: qty 20

## 2020-08-12 MED ORDER — HYDROMORPHONE HCL 2 MG PO TABS: 2.0000 mg | ORAL_TABLET | ORAL | Status: DC | PRN

## 2020-08-12 MED ORDER — METOCLOPRAMIDE HCL 5 MG/ML IJ SOLN: 5.0000 mg | Freq: Three times a day (TID) | INTRAMUSCULAR | Status: DC | PRN

## 2020-08-12 MED ORDER — HYDROMORPHONE HCL 1 MG/ML IJ SOLN
0.5000 mg | INTRAMUSCULAR | Status: DC | PRN
  Administered 2020-08-12: 1 mg via INTRAVENOUS
  Administered 2020-08-12: 0.5 mg via INTRAVENOUS
  Administered 2020-08-13 (×4): 1 mg via INTRAVENOUS
  Administered 2020-08-13 – 2020-08-14 (×4): 0.5 mg via INTRAVENOUS
  Administered 2020-08-14: 1 mg via INTRAVENOUS
  Filled 2020-08-12 (×2): qty 1
  Filled 2020-08-12 (×2): qty 0.5
  Filled 2020-08-12: qty 1
  Filled 2020-08-12: qty 0.5
  Filled 2020-08-12 (×3): qty 1
  Filled 2020-08-12: qty 0.5

## 2020-08-12 MED ORDER — TRANEXAMIC ACID-NACL 1000-0.7 MG/100ML-% IV SOLN
INTRAVENOUS | Status: AC
  Filled 2020-08-12: qty 100

## 2020-08-12 MED ORDER — CHLORHEXIDINE GLUCONATE 0.12 % MT SOLN
OROMUCOSAL | Status: AC
  Administered 2020-08-12: 15 mL via OROMUCOSAL
  Filled 2020-08-12: qty 15

## 2020-08-12 MED ORDER — MAGNESIUM HYDROXIDE 400 MG/5ML PO SUSP: 30.0000 mL | Freq: Every day | ORAL | Status: DC | PRN

## 2020-08-12 MED ORDER — 0.9 % SODIUM CHLORIDE (POUR BTL) OPTIME
TOPICAL | Status: DC | PRN
  Administered 2020-08-12: 1000 mL

## 2020-08-12 MED ORDER — ONDANSETRON HCL 4 MG/2ML IJ SOLN: 4.0000 mg | Freq: Four times a day (QID) | INTRAMUSCULAR | Status: DC | PRN

## 2020-08-12 MED ORDER — SUGAMMADEX SODIUM 200 MG/2ML IV SOLN
INTRAVENOUS | Status: DC | PRN
  Administered 2020-08-12: 200 mg via INTRAVENOUS

## 2020-08-12 MED ORDER — ONDANSETRON HCL 4 MG/2ML IJ SOLN
INTRAMUSCULAR | Status: DC | PRN
  Administered 2020-08-12: 4 mg via INTRAVENOUS

## 2020-08-12 MED ORDER — ONDANSETRON HCL 4 MG/2ML IJ SOLN
INTRAMUSCULAR | Status: AC
  Filled 2020-08-12: qty 2

## 2020-08-12 MED ORDER — POTASSIUM CHLORIDE IN NACL 20-0.9 MEQ/L-% IV SOLN
INTRAVENOUS | Status: DC
  Filled 2020-08-12: qty 1000

## 2020-08-12 MED ORDER — LIDOCAINE 2% (20 MG/ML) 5 ML SYRINGE
INTRAMUSCULAR | Status: AC
  Filled 2020-08-12: qty 5

## 2020-08-12 MED ORDER — VANCOMYCIN HCL 1000 MG IV SOLR
INTRAVENOUS | Status: DC | PRN
  Administered 2020-08-12: 1000 mg via TOPICAL

## 2020-08-12 SURGICAL SUPPLY — 104 items
BANDAGE ESMARK 6X9 LF (GAUZE/BANDAGES/DRESSINGS) ×1 IMPLANT
BIT DRILL 100X2.5XANTM LCK (BIT) IMPLANT
BIT DRILL 2.5X2.75 QC CALB (BIT) ×1 IMPLANT
BIT DRILL 2.9 CANN QC NONSTRL (BIT) ×1 IMPLANT
BIT DRILL CAL (BIT) IMPLANT
BIT DRL 100X2.5XANTM LCK (BIT) ×1
BLADE CLIPPER SURG (BLADE) IMPLANT
BLADE SURG 10 STRL SS (BLADE) ×2 IMPLANT
BLADE SURG 15 STRL LF DISP TIS (BLADE) ×1 IMPLANT
BLADE SURG 15 STRL SS (BLADE) ×2
BNDG CMPR 9X6 STRL LF SNTH (GAUZE/BANDAGES/DRESSINGS) ×1
BNDG COHESIVE 4X5 TAN STRL (GAUZE/BANDAGES/DRESSINGS) ×2 IMPLANT
BNDG ELASTIC 4X5.8 VLCR STR LF (GAUZE/BANDAGES/DRESSINGS) ×2 IMPLANT
BNDG ELASTIC 6X5.8 VLCR STR LF (GAUZE/BANDAGES/DRESSINGS) ×2 IMPLANT
BNDG ESMARK 6X9 LF (GAUZE/BANDAGES/DRESSINGS) ×2
BNDG GAUZE ELAST 4 BULKY (GAUZE/BANDAGES/DRESSINGS) ×4 IMPLANT
BONE CANC CHIPS 40CC CAN1/2 (Bone Implant) ×4 IMPLANT
BRUSH SCRUB EZ PLAIN DRY (MISCELLANEOUS) ×4 IMPLANT
CANISTER SUCT 3000ML PPV (MISCELLANEOUS) ×2 IMPLANT
CHIPS CANC BONE 40CC CAN1/2 (Bone Implant) ×2 IMPLANT
COVER SURGICAL LIGHT HANDLE (MISCELLANEOUS) ×4 IMPLANT
COVER WAND RF STERILE (DRAPES) ×2 IMPLANT
CUFF TOURN SGL QUICK 34 (TOURNIQUET CUFF) ×2
CUFF TRNQT CYL 34X4.125X (TOURNIQUET CUFF) ×1 IMPLANT
DRAPE C-ARM 42X72 X-RAY (DRAPES) ×2 IMPLANT
DRAPE C-ARMOR (DRAPES) ×2 IMPLANT
DRAPE HALF SHEET 40X57 (DRAPES) IMPLANT
DRAPE IMP U-DRAPE 54X76 (DRAPES) ×1 IMPLANT
DRAPE INCISE IOBAN 66X45 STRL (DRAPES) ×2 IMPLANT
DRAPE U-SHAPE 47X51 STRL (DRAPES) ×2 IMPLANT
DRILL BIT 2.5MM (BIT) ×2
DRILL BIT CAL (BIT) ×2
DRSG ADAPTIC 3X8 NADH LF (GAUZE/BANDAGES/DRESSINGS) ×2 IMPLANT
DRSG MEPILEX BORDER 4X4 (GAUZE/BANDAGES/DRESSINGS) ×2 IMPLANT
DRSG MEPITEL 4X7.2 (GAUZE/BANDAGES/DRESSINGS) ×1 IMPLANT
DRSG PAD ABDOMINAL 8X10 ST (GAUZE/BANDAGES/DRESSINGS) ×4 IMPLANT
ELECT REM PT RETURN 9FT ADLT (ELECTROSURGICAL) ×2
ELECTRODE REM PT RTRN 9FT ADLT (ELECTROSURGICAL) ×1 IMPLANT
GAUZE SPONGE 4X4 12PLY STRL (GAUZE/BANDAGES/DRESSINGS) ×2 IMPLANT
GLOVE BIO SURGEON STRL SZ7.5 (GLOVE) ×5 IMPLANT
GLOVE BIO SURGEON STRL SZ8 (GLOVE) ×2 IMPLANT
GLOVE BIOGEL PI IND STRL 7.5 (GLOVE) ×1 IMPLANT
GLOVE BIOGEL PI IND STRL 8 (GLOVE) ×1 IMPLANT
GLOVE BIOGEL PI INDICATOR 7.5 (GLOVE) ×4
GLOVE BIOGEL PI INDICATOR 8 (GLOVE) ×1
GOWN STRL REUS W/ TWL LRG LVL3 (GOWN DISPOSABLE) ×2 IMPLANT
GOWN STRL REUS W/ TWL XL LVL3 (GOWN DISPOSABLE) ×1 IMPLANT
GOWN STRL REUS W/TWL LRG LVL3 (GOWN DISPOSABLE) ×4
GOWN STRL REUS W/TWL XL LVL3 (GOWN DISPOSABLE) ×10
GRAFT BNE CHIP CANC 1-8 40 (Bone Implant) IMPLANT
HEMOSTAT ARISTA ABSORB 3G PWDR (HEMOSTASIS) ×1 IMPLANT
IMMOBILIZER KNEE 22 UNIV (SOFTGOODS) ×2 IMPLANT
K-WIRE ACE 1.6X6 (WIRE) ×8
KIT BASIN OR (CUSTOM PROCEDURE TRAY) ×2 IMPLANT
KIT TURNOVER KIT B (KITS) ×2 IMPLANT
KWIRE ACE 1.6X6 (WIRE) IMPLANT
MANIFOLD NEPTUNE II (INSTRUMENTS) ×1 IMPLANT
NDL SUT 6 .5 CRC .975X.05 MAYO (NEEDLE) IMPLANT
NEEDLE MAYO TAPER (NEEDLE) ×2
NS IRRIG 1000ML POUR BTL (IV SOLUTION) ×2 IMPLANT
PACK ORTHO EXTREMITY (CUSTOM PROCEDURE TRAY) ×2 IMPLANT
PAD ABD 8X10 STRL (GAUZE/BANDAGES/DRESSINGS) ×2 IMPLANT
PAD ARMBOARD 7.5X6 YLW CONV (MISCELLANEOUS) ×4 IMPLANT
PAD CAST 4YDX4 CTTN HI CHSV (CAST SUPPLIES) ×1 IMPLANT
PADDING CAST COTTON 4X4 STRL (CAST SUPPLIES) ×2
PADDING CAST COTTON 6X4 STRL (CAST SUPPLIES) ×4 IMPLANT
PLATE LOCK 9H STD LT PROX TIB (Plate) ×1 IMPLANT
SCREW CORT FT 32X3.5XNONLOCK (Screw) IMPLANT
SCREW CORTICAL 3.5MM  32MM (Screw) ×2 IMPLANT
SCREW CORTICAL 3.5MM  34MM (Screw) ×2 IMPLANT
SCREW CORTICAL 3.5MM 32MM (Screw) ×1 IMPLANT
SCREW CORTICAL 3.5MM 34MM (Screw) IMPLANT
SCREW CORTICAL 3.5MM 38MM (Screw) ×2 IMPLANT
SCREW CORTICAL 3.5MM 40MM (Screw) ×1 IMPLANT
SCREW LAG CANC STD HEAD 4.0 70 (Screw) ×2 IMPLANT
SCREW LOCK 3.5X70 DIST TIB (Screw) ×1 IMPLANT
SCREW LOCK 3.5X90 DIST TIB (Screw) ×1 IMPLANT
SCREW LOCK CORT STAR 3.5X80 (Screw) ×1 IMPLANT
SCREW LOCK CORT STAR 3.5X95 (Screw) ×2 IMPLANT
SCREW LP 3.5X65MM (Screw) ×1 IMPLANT
SCREW LP 3.5X85MM (Screw) ×1 IMPLANT
SPONGE LAP 18X18 RF (DISPOSABLE) ×3 IMPLANT
STAPLER VISISTAT 35W (STAPLE) ×2 IMPLANT
STOCKINETTE IMPERVIOUS LG (DRAPES) ×2 IMPLANT
SUCTION FRAZIER HANDLE 10FR (MISCELLANEOUS) ×2
SUCTION TUBE FRAZIER 10FR DISP (MISCELLANEOUS) ×1 IMPLANT
SUT ETHILON 2 0 FS 18 (SUTURE) ×6 IMPLANT
SUT ETHILON 3 0 PS 1 (SUTURE) IMPLANT
SUT PROLENE 0 CT 2 (SUTURE) ×4 IMPLANT
SUT VIC AB 0 CT1 27 (SUTURE) ×2
SUT VIC AB 0 CT1 27XBRD ANBCTR (SUTURE) ×1 IMPLANT
SUT VIC AB 1 CT1 27 (SUTURE) ×2
SUT VIC AB 1 CT1 27XBRD ANBCTR (SUTURE) ×1 IMPLANT
SUT VIC AB 2-0 CT1 27 (SUTURE) ×4
SUT VIC AB 2-0 CT1 TAPERPNT 27 (SUTURE) ×2 IMPLANT
TOWEL GREEN STERILE (TOWEL DISPOSABLE) ×4 IMPLANT
TOWEL GREEN STERILE FF (TOWEL DISPOSABLE) ×4 IMPLANT
TRAY FOLEY MTR SLVR 16FR STAT (SET/KITS/TRAYS/PACK) IMPLANT
TUBE CONNECTING 12X1/4 (SUCTIONS) ×2 IMPLANT
UNDERPAD 30X36 HEAVY ABSORB (UNDERPADS AND DIAPERS) ×2 IMPLANT
WASHER FLAT ACE (Orthopedic Implant) ×4 IMPLANT
WASHER PLAIN FLAT ACE NS 3PK (Orthopedic Implant) IMPLANT
WATER STERILE IRR 1000ML POUR (IV SOLUTION) ×4 IMPLANT
YANKAUER SUCT BULB TIP NO VENT (SUCTIONS) ×2 IMPLANT

## 2020-08-12 NOTE — Anesthesia Postprocedure Evaluation (Signed)
Anesthesia Post Note  Patient: Brian Atkinson  Procedure(s) Performed: OPEN REDUCTION INTERNAL FIXATION (ORIF) BICONDYLAR TIBIAL PLATEAU (Left ) REMOVAL EXTERNAL FIXATION LEG (Left )     Patient location during evaluation: PACU Anesthesia Type: General Level of consciousness: awake and alert, patient cooperative and oriented Pain control: continuing analgesia. Vital Signs Assessment: post-procedure vital signs reviewed and stable Respiratory status: spontaneous breathing, nonlabored ventilation, respiratory function stable and patient connected to nasal cannula oxygen Cardiovascular status: blood pressure returned to baseline and stable Postop Assessment: no apparent nausea or vomiting Anesthetic complications: no   No complications documented.  Last Vitals:  Vitals:   08/12/20 1255 08/12/20 1309  BP: 136/72 (!) 130/92  Pulse: (!) 101 99  Resp: 20 17  Temp:    SpO2: 97% 93%    Last Pain:  Vitals:   08/12/20 1240  TempSrc:   PainSc: 10-Worst pain ever                 Sameka Bagent,E. Danna Sewell

## 2020-08-12 NOTE — Progress Notes (Signed)
Pharmacy consult for xarelto for dvt ppx   Patient post op for multiple fractures  Plan: Xarelto 10 mg daily x 30 days  Elmer Sow, PharmD, BCPS, BCCCP Clinical Pharmacist [phone]  Please check AMION for all [facility] Pharmacy numbers  08/12/2020 4:33 PM

## 2020-08-12 NOTE — Progress Notes (Signed)
Orthopedic Tech Progress Note Patient Details:  Brayam Boeke Jun 04, 1951 [id number]  Ortho Devices Type of Ortho Device: Bone foam zero knee Ortho Device/Splint Location: LLE Ortho Device/Splint Interventions: Ordered, Application   Post Interventions Patient Tolerated: Well Instructions Provided: Care of device, Adjustment of device   Letishia Elliott 08/12/2020, 5:23 PM

## 2020-08-12 NOTE — Anesthesia Preprocedure Evaluation (Signed)
Anesthesia Evaluation  Patient identified by MRN, date of birth, ID band Patient awake    Reviewed: Allergy & Precautions, NPO status , Patient's Chart, lab work & pertinent test results  History of Anesthesia Complications Negative for: history of anesthetic complications  Airway Mallampati: II  TM Distance: >3 FB Neck ROM: Full    Dental  (+) Dental Advisory Given   Pulmonary neg pulmonary ROS,  08/12/2020 SARS coronavirus NEG   breath sounds clear to auscultation       Cardiovascular negative cardio ROS   Rhythm:Regular Rate:Normal     Neuro/Psych negative neurological ROS     GI/Hepatic negative GI ROS, Neg liver ROS,   Endo/Other  negative endocrine ROS  Renal/GU negative Renal ROS     Musculoskeletal   Abdominal (+) + obese,   Peds  Hematology  (+) Blood dyscrasia (Hb 9.7), anemia ,   Anesthesia Other Findings   Reproductive/Obstetrics                             Anesthesia Physical Anesthesia Plan  ASA: II  Anesthesia Plan: General   Post-op Pain Management:    Induction: Intravenous  PONV Risk Score and Plan: 2 and Ondansetron and Dexamethasone  Airway Management Planned: Oral ETT  Additional Equipment: None  Intra-op Plan:   Post-operative Plan: Extubation in OR  Informed Consent: I have reviewed the patients History and Physical, chart, labs and discussed the procedure including the risks, benefits and alternatives for the proposed anesthesia with the patient or authorized representative who has indicated his/her understanding and acceptance.     Dental advisory given  Plan Discussed with: CRNA and Surgeon  Anesthesia Plan Comments: (Pt declines Midazolam)        Anesthesia Quick Evaluation

## 2020-08-12 NOTE — Anesthesia Procedure Notes (Signed)
Procedure Name: Intubation Date/Time: 08/12/2020 8:27 AM Performed by: Shary Decamp, CRNA Pre-anesthesia Checklist: Patient identified, Patient being monitored, Timeout performed, Emergency Drugs available and Suction available Patient Re-evaluated:Patient Re-evaluated prior to induction Oxygen Delivery Method: Circle System Utilized Preoxygenation: Pre-oxygenation with 100% oxygen Induction Type: IV induction Ventilation: Mask ventilation without difficulty Laryngoscope Size: Miller and 2 Grade View: Grade I Tube type: Oral Tube size: 7.5 mm Number of attempts: 1 Airway Equipment and Method: Stylet Placement Confirmation: ETT inserted through vocal cords under direct vision,  positive ETCO2 and breath sounds checked- equal and bilateral Secured at: 22 cm Tube secured with: Tape Dental Injury: Teeth and Oropharynx as per pre-operative assessment

## 2020-08-12 NOTE — H&P (Addendum)
H&P documentation: 10/15  -History and Physical Reviewed  -Patient has been re-examined  -No change in the plan of care  PLAN: ORIF of left tibial plateau  I discussed with the patient the risks and benefits of surgery, including the possibility of infection, nerve injury, vessel injury, wound breakdown, arthritis, symptomatic hardware, DVT/ PE, loss of motion, malunion, nonunion, and need for further surgery among others. He acknowledged these risks and wished to proceed.  Myrene Galas, MD [facility], [facility] [phone]

## 2020-08-12 NOTE — Transfer of Care (Signed)
Immediate Anesthesia Transfer of Care Note  Patient: [name]  Procedure(s) Performed: OPEN REDUCTION INTERNAL FIXATION (ORIF) BICONDYLAR TIBIAL PLATEAU (Left ) REMOVAL EXTERNAL FIXATION LEG (Left )  Patient Location: PACU  Anesthesia Type:General  Level of Consciousness: drowsy, patient cooperative and responds to stimulation  Airway & Oxygen Therapy: Patient Spontanous Breathing and Patient connected to face mask oxygen  Post-op Assessment: Report given to RN, Post -op Vital signs reviewed and stable and Patient moving all extremities X 4  Post vital signs: Reviewed and stable  Last Vitals:  Vitals Value Taken Time  BP 139/74 08/12/20 1240  Temp    Pulse 109 08/12/20 1240  Resp 20 08/12/20 1240  SpO2 96 % 08/12/20 1240  Vitals shown include unvalidated device data.  Last Pain:  Vitals:   08/12/20 0735  TempSrc:   PainSc: 1       Patients Stated Pain Goal: 2 (08/12/20 0735)  Complications: No complications documented.

## 2020-08-12 NOTE — Op Note (Addendum)
12:32 PM 08/12/2020 08/12/2020  PATIENT:  Brian Atkinson  70 y.o. male  [id number]  PRE-OPERATIVE DIAGNOSIS:   1. LEFT BICONDYLAR TIBIAL PLATEAU FRACTURE 2. LEFT TIBIAL SPINE FRACTURE 3. RETAINED EXTERNAL FIXATION WITH ULCERATED PIN SITES THIGH AND TIBIA  POST-OPERATIVE DIAGNOSIS:   1. LEFT BICONDYLAR TIBIAL PLATEAU FRACTURE 2. LEFT TIBIAL SPINE FRACTURE 3. RETAINED EXTERNAL FIXATION WITH ULCERATED PIN SITES THIGH AND TIBIA 4. LEFT LATERAL MENISCUS TEAR   PROCEDURE:   1. ORIF OF LEFT BICONDYLAR TIBIAL PLATEAU FRACTURE  2. ORIF TIBIAL EMINENCE 3. ARTHROTOMY WITH PARTIAL MENISCECTOMY LEFT LATERAL MENISCUS 4. ANTERIOR COMPARTMENT FASCIOTOMY 5. REMOVAL OF EXTERNAL FIXATOR UNDER ANESTHESIA 6. CURETTAGE OF ULCERATED PIN SITES INCLUDING BONE OF TIBIA AND FEMUR  SURGEON:  Surgeon(s) and Role:    Myrene Galas, MD - Primary  PHYSICIAN ASSISTANT: 1. Montez Morita, PA-C; 2. PA Student  ANESTHESIA:   general  I/O:  Total I/O In: 2050 [I.V.:1700; IV Piggyback:350] Out: 600 [Urine:300; Blood:300]  SPECIMEN:  None  TOURNIQUET:   Total Tourniquet Time Documented: Thigh (Left) - 120 minutes Total: Thigh (Left) - 120 minutes  DICTATION: .Note written in EPIC  DISPOSITION: PACU  CONDITION: STABLE    BRIEF SUMMARY AND INDICATION FOR PROCEDURE:  Patient is a 69 y.o.-year- old with a tibial plateau fracture, treated provisionally with external fixation, aggressive ice, elevation, and active motion of the foot and toes to facilitate resolution of soft tissue swelling.  We did discuss with the patient the risks and benefits of surgical treatment including the potential for arthritis, nerve injury, vessel injury, loss of motion, DVT, PE, heart attack, stroke, symptomatic hardware, need for further surgery, and multiple others.  The patient acknowledged these risks and wished to proceed.   BRIEF SUMMARY OF PROCEDURE:  After administration of preoperative antibiotics, the patient was  taken to the operating room.  General anesthesia was induced. The external fixator was then removed and scrubbing of the skin and pin sites with chlorhexidine sponge performed, followed by shaving of the surgical field. The lower extremity prepped in usual sterile fashion using a chlorhexidine wash. A timeout was performed.  The leg was isolated from the clamps with towels and the fixator removed.  Standard prep and drape then performed with Betadine scrub and paint, and the ulcerated pin sites, which each measured approximately 10 mm in diameter were then debrided with serial curettes at the skin, subcutaneous tissue, muscle fascia, and cortical bone levels, excising all nonviable, contaminated, and devitalized tissue along the tracts back to healthy pink tissue.  Again, the depth of debridement was from skin to bone.  These were irrigated thoroughly. Ioban used to isolate pin sites from the surgical field, and new gloves obtained by operative staff.  I then brought in the radiolucent triangle.  A curvilinear incision was made extending laterally over Gerdy's tubercle. Dissection was carried down where the soft tissues were left intact to the lateral plateau and rim.  I did incise the retinaculum proximal to the tibial plateau, and then going along inside the retinaculum performed a submeniscal arthrotomy, releasing the coronary ligament along its insertion onto the tibia. Zero prolene suture was used to reflect this and inspect the meniscus and joint surface. The joint was irrigated thoroughly and this revealed the lateral meniscus tear at the peripheral attachment onto the retinaculum as well as a small undersurface tear.  I was able to trim this back to a stable and healthy appearing edge.  The joint surface was markedly depressed.  We then released some of the anterior extensors to enable the plate to fit along the proximal shaft. A trapdoor was made in the lateral tibial metaphysis.  Through this, I  introduced a series of tamps and with my assistant pulling traction, I was able to elevate the articular surface in sequential fashion while watching it through the arthrotomy.  I was able to mobilize the eminence segment into reduction through the arthrotomy and secure it between the lateral and medial portions of the plateau. Once I had restored appropriate height to the lateral plateau, the bone defect was grafted with 80 cc of cancellous chips.  I then placed the plate laterally and used the [employer] clamp with the foot because of compromised bone strength to apply a compressive force across the joint line from lateral to medial and large tenaculum from anterior to posterior. This reduced the widened plateau back to the appropriate size, but I still had to secure the anterior fragment back to the palteau.  At this point, we placed standard fixation in the proximal row of the plate followed by locked fixation and then added to lag screws with washers from the tibial tubercle into the posterior plateau.  The defect in the metaphysis both medially and laterally was filled with 80 cc cancellous bone and tamped into place. This was followed by additional fixation within the shaft and then second row at the joint. My assistant was careful to control alignment throughout by using traction and bending forces. He also assited with retraction. All wounds were irrigated thoroughly. Final AP and LAT fluoro images showed restoration of alignment, reduction, and varus/ valgus stability on stress view. The tourniquet was deflated at exactly two hours. And closure performed after obtaining hemostasis with electrocautery and Arista.   Prior to closure, I turned my attention to the distal edge of the wound here underneath the skin.  I used the long scissors to spread both superficial and deep to the anterior compartment.  The fascia was then released for 8 to 10 cm to reduce the likelihood of the  postoperative compartment syndrome.  Once more, wound was irrigated and then a standard layered closure performed, 0 Vicryl, 2-0 Vicryl, and 3-0 nylon for the skin.  Sterile gently compressive dressing was applied and in knee immobilizer.  The patient was taken to the PACU in stable condition.   PROGNOSIS: The patient will be encouraged with unrestricted range of motion and this will begin immediately but no active extension against resistance.  Orders entered for nonweightbearing on the operative extremity, pharmacologic DVT prophylaxis, and mobilization with PT and OT. After discharge, we will plan to see the patient back in about 2 weeks for removal of sutures and we will continue to follow throughout the hospital stay.         Doralee Albino. Carola Frost, M.D.

## 2020-08-12 NOTE — Plan of Care (Signed)

## 2020-08-13 ENCOUNTER — Encounter (HOSPITAL_COMMUNITY): Payer: Self-pay | Admitting: Orthopedic Surgery

## 2020-08-13 LAB — CBC
HCT: 27.3 % — ABNORMAL LOW (ref 39.0–52.0)
Hemoglobin: 8.2 g/dL — ABNORMAL LOW (ref 13.0–17.0)
MCH: 26.4 pg (ref 26.0–34.0)
MCHC: 30 g/dL (ref 30.0–36.0)
MCV: 87.8 fL (ref 80.0–100.0)
Platelets: 448 10*3/uL — ABNORMAL HIGH (ref 150–400)
RBC: 3.11 MIL/uL — ABNORMAL LOW (ref 4.22–5.81)
RDW: 16.6 % — ABNORMAL HIGH (ref 11.5–15.5)
WBC: 11.9 10*3/uL — ABNORMAL HIGH (ref 4.0–10.5)
nRBC: 0 % (ref 0.0–0.2)

## 2020-08-13 LAB — COMPREHENSIVE METABOLIC PANEL
ALT: 17 U/L (ref 0–44)
AST: 22 U/L (ref 15–41)
Albumin: 2.9 g/dL — ABNORMAL LOW (ref 3.5–5.0)
Alkaline Phosphatase: 83 U/L (ref 38–126)
Anion gap: 8 (ref 5–15)
BUN: 12 mg/dL (ref 8–23)
CO2: 25 mmol/L (ref 22–32)
Calcium: 8.4 mg/dL — ABNORMAL LOW (ref 8.9–10.3)
Chloride: 104 mmol/L (ref 98–111)
Creatinine, Ser: 0.89 mg/dL (ref 0.61–1.24)
GFR, Estimated: >60 mL/min (ref >60)
Glucose, Bld: 134 mg/dL — ABNORMAL HIGH (ref 70–99)
Potassium: 4.2 mmol/L (ref 3.5–5.1)
Sodium: 137 mmol/L (ref 135–145)
Total Bilirubin: 0.2 mg/dL — ABNORMAL LOW (ref 0.3–1.2)
Total Protein: 5.9 g/dL — ABNORMAL LOW (ref 6.5–8.1)

## 2020-08-13 MED ORDER — SODIUM CHLORIDE 0.9 % IV BOLUS
500.0000 mL | Freq: Once | INTRAVENOUS | Status: AC
  Administered 2020-08-13: 500 mL via INTRAVENOUS

## 2020-08-13 MED ORDER — HYDROMORPHONE HCL 2 MG PO TABS
2.0000 mg | ORAL_TABLET | ORAL | Status: DC | PRN
  Administered 2020-08-13 – 2020-08-14 (×4): 4 mg via ORAL
  Filled 2020-08-13 (×4): qty 2

## 2020-08-13 NOTE — Progress Notes (Addendum)
Orthopaedic Trauma Service Progress Note  Patient ID: Brian Atkinson MRN: [id number] DOB/AGE: 68-Jun-1952 24 y.o.  Subjective:  Doing well Pain controlled currently but requiring IV meds  No acute issues at this time   ROS As above  Objective:   VITALS:   Vitals:   08/12/20 2012 08/13/20 0028 08/13/20 0524 08/13/20 0818  BP: (!) 142/74 132/72 125/76 116/67  Pulse: (!) 106 (!) 106 98 96  Resp: 16 16 18 18   Temp: 98.5 F (36.9 C) 98.8 F (37.1 C) 97.7 F (36.5 C) 98.3 F (36.8 C)  TempSrc: Oral Oral Oral Oral  SpO2: 98% 97% 97% 95%  Weight:      Height:        Estimated body mass index is 32.61 kg/m as calculated from the following:   Height as of this encounter: 5' 10.98" (1.803 m).   Weight as of this encounter: 106 kg.   Intake/Output      11/04 0701 - 11/05 0700 11/05 0701 - 11/06 0700   P.O. 240    I.V. (mL/kg) 1846.6 (17.4)    IV Piggyback 450    Total Intake(mL/kg) 2536.6 (23.9)    Urine (mL/kg/hr) 2020 (0.8)    Blood 300    Total Output 2320    Net +216.6           LABS  Results for orders placed or performed during the hospital encounter of 08/12/20 (from the past 24 hour(s))  CBC     Status: Abnormal   Collection Time: 08/13/20  2:57 AM  Result Value Ref Range   WBC 11.9 (H) 4.0 - 10.5 K/uL   RBC 3.11 (L) 4.22 - 5.81 MIL/uL   Hemoglobin 8.2 (L) 13.0 - 17.0 g/dL   HCT 13/05/21 (L) 39 - 52 %   MCV 87.8 80.0 - 100.0 fL   MCH 26.4 26.0 - 34.0 pg   MCHC 30.0 30.0 - 36.0 g/dL   RDW 72.5 (H) 36.6 - 44.0 %   Platelets 448 (H) 150 - 400 K/uL   nRBC 0.0 0.0 - 0.2 %  Comprehensive metabolic panel     Status: Abnormal   Collection Time: 08/13/20  2:57 AM  Result Value Ref Range   Sodium 137 135 - 145 mmol/L   Potassium 4.2 3.5 - 5.1 mmol/L   Chloride 104 98 - 111 mmol/L   CO2 25 22 - 32 mmol/L   Glucose, Bld 134 (H) 70 - 99 mg/dL   BUN 12 8 - 23 mg/dL   Creatinine, Ser  13/05/21 0.61 - 1.24 mg/dL   Calcium 8.4 (L) 8.9 - 10.3 mg/dL   Total Protein 5.9 (L) 6.5 - 8.1 g/dL   Albumin 2.9 (L) 3.5 - 5.0 g/dL   AST 22 15 - 41 U/L   ALT 17 0 - 44 U/L   Alkaline Phosphatase 83 38 - 126 U/L   Total Bilirubin 0.2 (L) 0.3 - 1.2 mg/dL   GFR, Estimated 4.25 >95 mL/min   Anion gap 8 5 - 15     PHYSICAL EXAM:   Gen: sitting up in bed, comfortable appearing, NAD Lungs: unlabored Cardiac: RRR Ext:       Left Lower Extremity   Dressings c/d/i  Ext warm   + DP pulse  No DCT  DPN, SPN, TN sensation  intact  Ankle flexion, extension, inversion and eversion intact  No pain out of proportion with passive stretching   Swelling stable  Assessment/Plan: 1 Day Post-Op   Principal Problem:   Closed bicondylar fracture of left tibial plateau Active Problems:   History of MRSA infection left patella    Anti-infectives (From admission, onward)   Start     Dose/Rate Route Frequency Ordered Stop   08/12/20 1700  ceFAZolin (ANCEF) IVPB 2g/100 mL premix        2 g 200 mL/hr over 30 Minutes Intravenous Every 6 hours 08/12/20 1558 08/13/20 0435   08/12/20 0939  vancomycin (VANCOCIN) powder  Status:  Discontinued          As needed 08/12/20 0940 08/12/20 1234   08/12/20 0730  ceFAZolin (ANCEF) IVPB 2g/100 mL premix        2 g 200 mL/hr over 30 Minutes Intravenous On call to O.R. 08/12/20 0717 08/12/20 0820   08/12/20 0722  ceFAZolin (ANCEF) 2-4 GM/100ML-% IVPB       Note to Pharmacy: Marcelle Smiling   : cabinet override      08/12/20 0722 08/12/20 0823    .  POD/HD#: 83  69 year old male with comminuted left bicondylar tibial plateau fracture  -Comminuted left bicondylar tibial plateau fracture s/p ORIF and removal of external fixator.  Repair of left tibial tubercle  Nonweightbearing left leg x 8 weeks  Unrestricted range of motion left knee except no active extension against resistance  Dressing change as needed starting tomorrow  Continue with aggressive ice and  elevation  No resting with knee in flexion.  Use bone foam or elevate heel on pillows  PT and OT evaluations  Anticipate discharge home on Monday   PT- please teach HEP for L knee ROM- AROM, PROM. Prone exercises as well. No ROM restrictions except no active knee extension against resistance.  Quad sets, SLR, LAQ, SAQ, heel slides, stretching, prone flexion and extension   Ankle theraband program, heel cord stretching, toe towel curls, etc  No pillows under bend of knee when at rest, ok to place under heel to help work on extension. Can also use zero knee bone foam if available   - Pain management:  Will try oral Dilaudid to see if this is more effective for him.  We discussed that starting on postop day #2 he should minimize his IV pain medicine use  Multimodal  - ABL anemia/Hemodynamics  Monitor currently stable  - Medical issues   No chronic issues  - DVT/PE prophylaxis:  Xarelto x 4 weeks  - ID:   Perioperative antibiotics  - Metabolic Bone Disease:  Vitamin D levels look good.  Will back down on his vitamin D regimen.  His home medication list indicate that he is taking 10,000 IUs of vitamin D3 a day.  Will back him down to 2000 IUs daily  - Activity:  Nonweightbearing left leg  Therapy evaluations  - FEN/GI prophylaxis/Foley/Lines:  Advance diet  NSL   - Impediments to fracture healing:  Complex intra-articular injury   - Dispo:  Continue with inpatient care  Anticipate dc on Monday     [name], PA-C [phone] (C) 08/13/2020, 9:58 AM  [facility] [address] [place] [address] [phone] [phone] (F)    After 5pm and on the weekends please log on to Amion, go to orthopaedics and the look under the [facility] Call for the provider(s) on call. You can also  call our office at [phone] and then follow the prompts to be connected to the call team.

## 2020-08-13 NOTE — Evaluation (Signed)
Occupational Therapy Evaluation Patient Details Name: Brian Atkinson MRN: [id number] DOB: 1951/05/03 Today's Date: 08/13/2020    History of Present Illness 68 yo s/p ORIF L bicondylar tibial plateau fracture, partial meniscectomy of L lateral meniscus, anterior compartment fasciotomy on 11/4. Pt's original injury was fall 15 ft resulting in complex L tibial plateau fracture, s/p ex-fib 10/19. Has been home with wife and family with exfix, readmitted for ORIF.   Clinical Impression   Limited evaluation completed secondary to pt reports of 12/10 pain though premedicated prior to session. Pt has been NWB prior to surgery and reports challenging to complete ADLs/mobility, but he was able to do so. Wife assists L LE in/out of bed. Educated and reinforced compensatory strategies for LB ADLs and safety with DME with pt verbalizing understanding. Recommend HHOT/PT follow-up but pt reports he lives too far away for anyone to come to his home and prefers HEPs/handouts for what he needs to do. Plan to provide handouts and further address OOB ADLs during next session if pain more controlled.     Follow Up Recommendations  Home health OT;Supervision - Intermittent    Equipment Recommendations  Wheelchair cushion (measurements OT) (reports he has ordered a wheelchair already)    Recommendations for Other Services       Precautions / Restrictions Precautions Precautions: Fall Precaution Comments: "maintain bone foam" Restrictions Weight Bearing Restrictions: Yes LLE Weight Bearing: Non weight bearing      Mobility Bed Mobility Overal bed mobility: Needs Assistance Bed Mobility: Supine to Sit     Supine to sit: Min assist;HOB elevated     General bed mobility comments: Pt declined due to pain but reports he only needs assist for L LE     Transfers Overall transfer level: Needs assistance Equipment used: Rolling walker (2 wheeled) Transfers: Sit to/from [employer] Sit to Stand: Min guard;From elevated surface Stand pivot transfers: Min assist;From elevated surface       General transfer comment: pt declined due to pain     Balance Overall balance assessment: Needs assistance Sitting-balance support: No upper extremity supported;Feet supported Sitting balance-Leahy Scale: Fair     Standing balance support: Bilateral upper extremity supported;During functional activity Standing balance-Leahy Scale: Poor Standing balance comment: reliant on external assist in standing                           ADL either performed or assessed with clinical judgement   ADL Overall ADL's : Needs assistance/impaired Eating/Feeding: Independent;Sitting   Grooming: Set up;Sitting   Upper Body Bathing: Set up;Sitting   Lower Body Bathing: Moderate assistance;Sitting/lateral leans;Sit to/from stand   Upper Body Dressing : Set up;Sitting   Lower Body Dressing: Moderate assistance;Sitting/lateral leans;Sit to/from stand       Toileting- Architect and Hygiene: Moderate assistance;Sitting/lateral lean;Sit to/from stand         General ADL Comments: Limited evaluation secondary to pain, but pt has been NWB L LE prior to surgery with ex fix and aware of many compensatory strategies. Further problem solved what had been difficult for pt at home and educated on plan/strategies     Vision Baseline Vision/History: Wears glasses Patient Visual Report: No change from baseline Vision Assessment?: No apparent visual deficits     Perception     Praxis      Pertinent Vitals/Pain Pain Assessment: 0-10 Pain Score: 10-Worst pain ever Pain Location: L knee, lower leg Pain Descriptors / Indicators: Sharp;Shooting  Pain Intervention(s): Limited activity within patient's tolerance;Premedicated before session;Monitored during session;Ice applied     Hand Dominance Right   Extremity/Trunk Assessment Upper Extremity Assessment Upper  Extremity Assessment: Overall WFL for tasks assessed   Lower Extremity Assessment Lower Extremity Assessment: Defer to PT evaluation LLE Deficits / Details: able to perform toe flex/ext, quad set, knee flexion to ~25* limited by pain LLE: Unable to fully assess due to pain   Cervical / Trunk Assessment Cervical / Trunk Assessment: Normal   Communication Communication Communication: No difficulties   Cognition Arousal/Alertness: Awake/alert Behavior During Therapy: WFL for tasks assessed/performed Overall Cognitive Status: Within Functional Limits for tasks assessed                                 General Comments: good adherence to NWB LLE   General Comments  Pt sleeping on entry, aroused by calling name. Reports 10/10 pain and unable to sleep due to pain...    Exercises General Exercises - Lower Extremity Ankle Circles/Pumps: AROM;Both;5 reps;Seated (toe flex/ext) Quad Sets: AROM;Left;5 reps;Seated   Shoulder Instructions      Home Living Family/patient expects to be discharged to:: Private residence Living Arrangements: Spouse/significant other;Children Available Help at Discharge: Family;Available 24 hours/day Type of Home: House Home Access: Level entry     Home Layout: Able to live on main level with bedroom/bathroom (gym/study in basement)     Bathroom Shower/Tub: Walk-in Human resources officer: Standard     Home Equipment: Environmental consultant - 2 wheels;Cane - single point;Crutches;Bedside commode   Additional Comments: states he has a wheelchair ordered from [facility]      Prior Functioning/Environment Level of Independence: Independent with assistive device(s)        Comments: Pt using RW for mobility PTA, states mobility was difficult so he would "get somewhere and stay"        OT Problem List: Decreased range of motion;Pain;Impaired balance (sitting and/or standing);Decreased activity tolerance;Decreased knowledge of use of DME or AE      OT  Treatment/Interventions: Patient/family education;Self-care/ADL training;Therapeutic exercise;DME and/or AE instruction;Balance training;Therapeutic activities    OT Goals(Current goals can be found in the care plan section) Acute Rehab OT Goals Patient Stated Goal: decrease pain then go home OT Goal Formulation: With patient Time For Goal Achievement: 08/27/20 Potential to Achieve Goals: Good ADL Goals Pt Will Perform Lower Body Bathing: with supervision;sit to/from stand Pt Will Perform Lower Body Dressing: with supervision;sitting/lateral leans;sit to/from stand Pt Will Transfer to Toilet: with supervision;ambulating;bedside commode Pt Will Perform Toileting - Clothing Manipulation and hygiene: with supervision;sitting/lateral leans;sit to/from stand  OT Frequency: Min 2X/week   Barriers to D/C:            Co-evaluation              AM-PAC OT "6 Clicks" Daily Activity     Outcome Measure Help from another person eating meals?: None Help from another person taking care of personal grooming?: A Little Help from another person toileting, which includes using toliet, bedpan, or urinal?: A Lot Help from another person bathing (including washing, rinsing, drying)?: A Lot Help from another person to put on and taking off regular upper body clothing?: A Little Help from another person to put on and taking off regular lower body clothing?: A Lot 6 Click Score: 16   End of Session Nurse Communication: Patient requests pain meds  Activity Tolerance: Patient limited by pain  Patient left: in bed;with bed alarm set;with call bell/phone within reach  OT Visit Diagnosis: Muscle weakness (generalized) (M62.81);Unsteadiness on feet (R26.81);Pain Pain - Right/Left: Left Pain - part of body: Leg                Time: 0258-5277 OT Time Calculation (min): 21 min Charges:  OT General Charges $OT Visit: 1 Visit OT Evaluation $OT Eval Low Complexity: 1 Low  Lorre Munroe, OTR/L  Lorre Munroe 08/13/2020, 1:54 PM

## 2020-08-13 NOTE — Plan of Care (Signed)
  Problem: Pain Managment: Goal: General experience of comfort will improve Outcome: Progressing   

## 2020-08-13 NOTE — Evaluation (Signed)
Physical Therapy Evaluation Patient Details Name: Brian Atkinson MRN: [id number] DOB: [birth year] Today's Date: 08/13/2020   History of Present Illness  69 yo s/p ORIF L bicondylar tibial plateau fracture, partial meniscectomy of L lateral meniscus, anterior compartment fasciotomy on 11/4. Pt's original injury was fall 15 ft resulting in complex L tibial plateau fracture, s/p ex-fib 10/19. Has been home with wife and family with exfix, readmitted for ORIF.  Clinical Impression   Pt presents with LLE pain, decreased L knee ROM, difficulty performing mobility tasks, impaired sitting and standing balance, and decreased activity tolerance vs baseline. Pt to benefit from acute PT to address deficits. Pt tolerated stand and pivot to recliner this day, requiring min assist for mobility overall. PT recommending HHPT, but pt states he would prefer to be administered an HEP vs have HHPT.  PT to progress mobility as tolerated, and will continue to follow acutely.      Follow Up Recommendations Home health PT;Supervision for mobility/OOB    Equipment Recommendations  None recommended by PT    Recommendations for Other Services       Precautions / Restrictions Precautions Precautions: Fall Precaution Comments: "maintain bone foam" Restrictions Weight Bearing Restrictions: Yes LLE Weight Bearing: Non weight bearing      Mobility  Bed Mobility Overal bed mobility: Needs Assistance Bed Mobility: Supine to Sit     Supine to sit: Min assist;HOB elevated     General bed mobility comments: Min assist for LLE translation to EOB, trunk elevation, scooting to EOB.    Transfers Overall transfer level: Needs assistance Equipment used: Rolling walker (2 wheeled) Transfers: Sit to/from [name] Sit to Stand: Min guard;From elevated surface Stand pivot transfers: Min assist;From elevated surface       General transfer comment: min guard for stand for safety, verbal  cuing for hand placement when rising/sitting and reinforcing NWB LLE. Min assist to steady during stand pivot to recliner, pt with x2 hop-to steps limited by pain.  Ambulation/Gait             General Gait Details: unable this day - too painful  Stairs            Wheelchair Mobility    Modified Rankin (Stroke Patients Only)       Balance Overall balance assessment: Needs assistance Sitting-balance support: No upper extremity supported;Feet supported Sitting balance-Leahy Scale: Fair     Standing balance support: Bilateral upper extremity supported;During functional activity Standing balance-Leahy Scale: Poor Standing balance comment: reliant on external assist in standing                             Pertinent Vitals/Pain Pain Assessment: 0-10 Pain Score: 5  Pain Location: L knee, lower leg Pain Descriptors / Indicators: Sore;Discomfort Pain Intervention(s): Limited activity within patient's tolerance;Monitored during session;Repositioned    Home Living Family/patient expects to be discharged to:: Private residence Living Arrangements: Spouse/significant other;Children Available Help at Discharge: Family;Available 24 hours/day Type of Home: House Home Access: Level entry     Home Layout: Able to live on main level with bedroom/bathroom Home Equipment: Walker - 2 wheels;Cane - single point;Crutches;Bedside commode Additional Comments: states he has a wheelchair ordered from [facility]    Prior Function Level of Independence: Independent with assistive device(s)         Comments: Pt using RW for mobility PTA, states mobility was difficult so he would "get somewhere and stay"  Hand Dominance   Dominant Hand: Right    Extremity/Trunk Assessment   Upper Extremity Assessment Upper Extremity Assessment: Defer to OT evaluation    Lower Extremity Assessment Lower Extremity Assessment: LLE deficits/detail LLE Deficits / Details: able to  perform toe flex/ext, quad set, knee flexion to ~25* limited by pain LLE: Unable to fully assess due to pain    Cervical / Trunk Assessment Cervical / Trunk Assessment: Normal  Communication   Communication: No difficulties  Cognition Arousal/Alertness: Awake/alert Behavior During Therapy: WFL for tasks assessed/performed Overall Cognitive Status: Within Functional Limits for tasks assessed                                 General Comments: good adherence to NWB LLE      General Comments      Exercises General Exercises - Lower Extremity Ankle Circles/Pumps: AROM;Both;5 reps;Seated (toe flex/ext) Quad Sets: AROM;Left;5 reps;Seated   Assessment/Plan    PT Assessment Patient needs continued PT services  PT Problem List Decreased strength;Decreased range of motion;Decreased balance;Decreased activity tolerance;Decreased mobility;Decreased knowledge of use of DME;Pain       PT Treatment Interventions DME instruction;Gait training;Functional mobility training;Therapeutic activities;Therapeutic exercise;Balance training;Patient/family education    PT Goals (Current goals can be found in the Care Plan section)  Acute Rehab PT Goals Patient Stated Goal: to go home PT Goal Formulation: With patient Time For Goal Achievement: 08/27/20 Potential to Achieve Goals: Good    Frequency Min 5X/week   Barriers to discharge        Co-evaluation               AM-PAC PT "6 Clicks" Mobility  Outcome Measure Help needed turning from your back to your side while in a flat bed without using bedrails?: A Little Help needed moving from lying on your back to sitting on the side of a flat bed without using bedrails?: A Little Help needed moving to and from a bed to a chair (including a wheelchair)?: A Little Help needed standing up from a chair using your arms (e.g., wheelchair or bedside chair)?: A Little Help needed to walk in hospital room?: A Little Help needed  climbing 3-5 steps with a railing? : A Lot 6 Click Score: 17    End of Session Equipment Utilized During Treatment: Gait belt Activity Tolerance: Patient limited by pain Patient left: in chair;with chair alarm set;with call bell/phone within reach Nurse Communication: Mobility status PT Visit Diagnosis: Other abnormalities of gait and mobility (R26.89);Difficulty in walking, not elsewhere classified (R26.2)    Time: 0947-0962 PT Time Calculation (min) (ACUTE ONLY): 23 min   Charges:   PT Evaluation $PT Eval Low Complexity: 1 Low PT Treatments $Therapeutic Activity: 8-22 mins       Marijayne Rauth E, PT Acute Rehabilitation Services Pager [phone]  Office [phone]   Latyra Jaye D Despina Hidden 08/13/2020, 12:27 PM

## 2020-08-13 NOTE — Progress Notes (Signed)
Notified Montez Morita, PA-C, about pt's elevated HR for most of the day today. Pt states his pain spikes to a 10/10 when he works on stretches from PT, despite oral pain medication. Verbal order to bolus 500cc NS. Will continue to monitor.

## 2020-08-14 LAB — BASIC METABOLIC PANEL
Anion gap: 6 (ref 5–15)
BUN: 8 mg/dL (ref 8–23)
CO2: 26 mmol/L (ref 22–32)
Calcium: 8.4 mg/dL — ABNORMAL LOW (ref 8.9–10.3)
Chloride: 104 mmol/L (ref 98–111)
Creatinine, Ser: 0.77 mg/dL (ref 0.61–1.24)
GFR, Estimated: >60 mL/min (ref >60)
Glucose, Bld: 104 mg/dL — ABNORMAL HIGH (ref 70–99)
Potassium: 3.9 mmol/L (ref 3.5–5.1)
Sodium: 136 mmol/L (ref 135–145)

## 2020-08-14 LAB — CBC
HCT: 28.1 % — ABNORMAL LOW (ref 39.0–52.0)
Hemoglobin: 8.4 g/dL — ABNORMAL LOW (ref 13.0–17.0)
MCH: 26.2 pg (ref 26.0–34.0)
MCHC: 29.9 g/dL — ABNORMAL LOW (ref 30.0–36.0)
MCV: 87.5 fL (ref 80.0–100.0)
Platelets: 392 10*3/uL (ref 150–400)
RBC: 3.21 MIL/uL — ABNORMAL LOW (ref 4.22–5.81)
RDW: 16.8 % — ABNORMAL HIGH (ref 11.5–15.5)
WBC: 9.2 10*3/uL (ref 4.0–10.5)
nRBC: 0 % (ref 0.0–0.2)

## 2020-08-14 MED ORDER — OXYCODONE HCL 5 MG PO TABS
5.0000 mg | ORAL_TABLET | ORAL | Status: DC | PRN
  Administered 2020-08-14: 5 mg via ORAL
  Administered 2020-08-14 – 2020-08-15 (×4): 10 mg via ORAL
  Filled 2020-08-14: qty 2
  Filled 2020-08-14: qty 1
  Filled 2020-08-14 (×3): qty 2

## 2020-08-14 NOTE — Plan of Care (Signed)

## 2020-08-14 NOTE — Progress Notes (Signed)
OT Cancellation Note  Patient Details Name: Brian Atkinson MRN: [id number] DOB: 01-Apr-1951   Cancelled Treatment:    Reason Eval/Treat Not Completed: Pain limiting ability to participate;Fatigue/lethargy limiting ability to participate;Patient declined, no reason specified Pt remains hesitant to work with therapy due to significant pain after evaluation. Therapist advocated the importance of mobility, to which patient agreed. Will re-attempt later this PM to promote safe discharge home.   Pollyann Glen E. Emori Mumme, COTA/L Acute Rehabilitation Services [phone]  Brian Atkinson 08/14/2020, 11:36 AM

## 2020-08-14 NOTE — Plan of Care (Signed)
  Problem: Education: Goal: Knowledge of General Education information will improve Description: Including pain rating scale, medication(s)/side effects and non-pharmacologic comfort measures Outcome: Progressing   Problem: Health Behavior/Discharge Planning: Goal: Ability to manage health-related needs will improve Outcome: Progressing   Problem: Activity: Goal: Risk for activity intolerance will decrease Outcome: Progressing   Problem: Coping: Goal: Level of anxiety will decrease Outcome: Progressing   Problem: Elimination: Goal: Will not experience complications related to bowel motility Outcome: Progressing   Problem: Pain Managment: Goal: General experience of comfort will improve Outcome: Progressing   Problem: Safety: Goal: Ability to remain free from injury will improve Outcome: Progressing   Problem: Skin Integrity: Goal: Risk for impaired skin integrity will decrease Outcome: Progressing   

## 2020-08-14 NOTE — Progress Notes (Signed)
Subjective: 2 Days Post-Op s/p Procedure(s): OPEN REDUCTION INTERNAL FIXATION (ORIF) BICONDYLAR TIBIAL PLATEAU REMOVAL EXTERNAL FIXATION LEG   Patient reports severe pain over the last 18 hours. States PO dilaudid did not cover pain at all, needed several doses of IV dilaudid to help pain.  Denies chest pain, SOB, Calf pain. No nausea/vomiting. No other complaints.   Objective:  PE: VITALS:   Vitals:   08/13/20 1800 08/13/20 2010 08/14/20 0236 08/14/20 0432  BP:  140/89  (!) 141/88  Pulse: 95 97 86 96  Resp:  17  18  Temp:  98.4 F (36.9 C)  98 F (36.7 C)  TempSrc:  Oral  Oral  SpO2:  99% 96% 98%  Weight:      Height:       General: alert, oriented, sitting up in bed, in no acute distress Resp: no use of accessory musculature GI: abdomen soft, non-tender MSK: Dressing C/D/I. Able to move all toes of left foot, no pain with passive stretch. No TTP to left calf. + DP pulse. Sensation intact to all aspects of left foot. Dorsiflexion and Plantarflexion intact at ankle.   LABS  Results for orders placed or performed during the hospital encounter of 08/12/20 (from the past 24 hour(s))  Basic metabolic panel     Status: Abnormal   Collection Time: 08/14/20  5:11 AM  Result Value Ref Range   Sodium 136 135 - 145 mmol/L   Potassium 3.9 3.5 - 5.1 mmol/L   Chloride 104 98 - 111 mmol/L   CO2 26 22 - 32 mmol/L   Glucose, Bld 104 (H) 70 - 99 mg/dL   BUN 8 8 - 23 mg/dL   Creatinine, Ser 9.23 0.61 - 1.24 mg/dL   Calcium 8.4 (L) 8.9 - 10.3 mg/dL   GFR, Estimated >30 >07 mL/min   Anion gap 6 5 - 15  CBC     Status: Abnormal   Collection Time: 08/14/20  5:11 AM  Result Value Ref Range   WBC 9.2 4.0 - 10.5 K/uL   RBC 3.21 (L) 4.22 - 5.81 MIL/uL   Hemoglobin 8.4 (L) 13.0 - 17.0 g/dL   HCT 62.2 (L) 39 - 52 %   MCV 87.5 80.0 - 100.0 fL   MCH 26.2 26.0 - 34.0 pg   MCHC 29.9 (L) 30.0 - 36.0 g/dL   RDW 63.3 (H) 35.4 - 56.2 %   Platelets 392 150 - 400 K/uL   nRBC 0.0 0.0 - 0.2  %    DG Knee Complete 4 Views Left  Result Date: 08/12/2020 CLINICAL DATA:  Open reduction and internal fixation of left tibial plateau fracture. EXAM: LEFT KNEE - COMPLETE 4+ VIEW; DG C-ARM 1-60 MIN Radiation exposure index: 8.36 mGy. COMPARISON:  None. FINDINGS: Ten intraoperative fluoroscopic images were obtained of the left knee. These images demonstrate surgical internal fixation of tibial plateau fracture. Good alignment of fracture components is noted. IMPRESSION: Status post surgical internal fixation of left tibial plateau fracture. Electronically Signed   By: Lupita Raider M.D.   On: 08/12/2020 12:12   DG Knee Left Port  Result Date: 08/12/2020 CLINICAL DATA:  Fracture, postop EXAM: PORTABLE LEFT KNEE - 1-2 VIEW COMPARISON:  Radiograph same day FINDINGS: Extensively comminuted proximal tibial fracture is seen status post ORIF plate screw fixation. No significant change in alignment is noted. No new fractures are identified. Enthesophytes seen at the patella with a healed fracture deformity. There is diffuse subcutaneous edema. A small  joint effusion is noted. IMPRESSION: Status post ORIF plate screw fixation of the comminuted intra-articular proximal tibial fracture without acute hardware abnormality. Electronically Signed   By: Jonna Clark M.D.   On: 08/12/2020 15:49   DG C-Arm 1-60 Min  Result Date: 08/12/2020 CLINICAL DATA:  Open reduction and internal fixation of left tibial plateau fracture. EXAM: LEFT KNEE - COMPLETE 4+ VIEW; DG C-ARM 1-60 MIN Radiation exposure index: 8.36 mGy. COMPARISON:  None. FINDINGS: Ten intraoperative fluoroscopic images were obtained of the left knee. These images demonstrate surgical internal fixation of tibial plateau fracture. Good alignment of fracture components is noted. IMPRESSION: Status post surgical internal fixation of left tibial plateau fracture. Electronically Signed   By: Lupita Raider M.D.   On: 08/12/2020 12:12     Assessment/Plan: Comminuted left bicondylar tibial plateau fracture 2 Days Post-Op s/p Procedure(s): OPEN REDUCTION INTERNAL FIXATION (ORIF) BICONDYLAR TIBIAL PLATEAU REMOVAL EXTERNAL FIXATION LEG - Weightbearing: NWB LLE x 8 weeks, unrestricted ROM at left knee except no active extension against resistance, no resting with knee in flexion - use bone foam or elevate heel on pillows Insicional and dressing care: PRN dressing changes - continue ice and elevation VTE prophylaxis: Xarelto x 4 weeks  Pain control: Patient needed multiple IV dilaudid doses and is very frustrated regarding continued pain. States PO dilaudid did not help at all. Continue PO dilaudid and limit IV dilaudid as much as possible today. I've also added oxycodone 5-10 mg to see if this covers his pain better than the dilaudid today. This is to be used instead of PO dilaudid.  Acute blood loss anemia: - Hbg trending up, 8.4 today  Dispo Anticipate discharge on Monday  Contact information:   Weekdays 8-5 Janine Ores, PA-C [phone] A fter hours and holidays please check [email or web address] for group call information for [facility]  Armida Sans 08/14/2020, 8:23 AM

## 2020-08-14 NOTE — Progress Notes (Signed)
Patient's pain unrelieved by PO dilaudid during the night IV dilaudid administered when unrelieved by PO medications.  Patient states he is worried he did something to his knee during therapy but doesn't recall twisting or putting any pressure on his leg.  Ice and repositioning measures also attempted during the night with some short term relief.

## 2020-08-14 NOTE — Plan of Care (Signed)

## 2020-08-14 NOTE — Progress Notes (Signed)
Occupational Therapy Treatment Patient Details Name: Brian Atkinson MRN: [id number] DOB: 04-13-1951 Today's Date: 08/14/2020    History of present illness 69 yo s/p ORIF L bicondylar tibial plateau fracture, partial meniscectomy of L lateral meniscus, anterior compartment fasciotomy on 11/4. Pt's original injury was fall 15 ft resulting in complex L tibial plateau fracture, s/p ex-fib 10/19. Has been home with wife and family with exfix, readmitted for ORIF.   OT comments  Patient continues to make minimal progress towards goals in skilled OT session due to pain. Patient's session encompassed sitting EOB with therapist while therapist held LLE. Pt remains hesitant to attempt increased mobility due to pt feeling that after he worked with PT two days ago he hasnt been the same and cant get the pain below a 5. Therapist continued to advocate for mobility, and pt is able to use BUE to bridge over to EOB. Discharge remains appropriate but will need to demonstrate ability to mobilize to complete functional ADLs again to ensure safety. Therapy will continue to follow.    Follow Up Recommendations  Home health OT;Supervision - Intermittent    Equipment Recommendations  Wheelchair cushion (measurements OT) (has already ordered wheelchair)    Recommendations for Other Services      Precautions / Restrictions Precautions Precautions: Fall Precaution Comments: "maintain bone foam" Restrictions Weight Bearing Restrictions: Yes LLE Weight Bearing: Non weight bearing       Mobility Bed Mobility Overal bed mobility: Needs Assistance Bed Mobility: Supine to Sit     Supine to sit: Min assist;HOB elevated     General bed mobility comments: Therapist maneuvered LLE in order to sit EOB, but elevated HOB and able to use BUEs to come to sitting  Transfers                      Balance Overall balance assessment: Needs assistance Sitting-balance support: No upper extremity  supported;Feet supported Sitting balance-Leahy Scale: Fair                                     ADL either performed or assessed with clinical judgement   ADL Overall ADL's : Needs assistance/impaired                                     Functional mobility during ADLs: Minimal assistance;Cueing for sequencing;Cueing for safety General ADL Comments: Limited session due to pain, therapist had to hold leg at 90 (gradually lowered 20 degrees before increased pain and elevated back to 90) when sitting EOB     Vision       Perception     Praxis      Cognition Arousal/Alertness: Awake/alert Behavior During Therapy: WFL for tasks assessed/performed Overall Cognitive Status: Within Functional Limits for tasks assessed                                 General Comments: good adherence to NWB LLE        Exercises     Shoulder Instructions       General Comments      Pertinent Vitals/ Pain       Pain Assessment: 0-10 Pain Score: 9  Pain Location: L knee, lower leg Pain Descriptors / Indicators: Sharp;Shooting;Constant;Guarding;Grimacing Pain Intervention(s):  Limited activity within patient's tolerance;Monitored during session;Premedicated before session;Repositioned;Ice applied  Home Living                                          Prior Functioning/Environment              Frequency  Min 2X/week        Progress Toward Goals  OT Goals(current goals can now be found in the care plan section)  Progress towards OT goals: Progressing toward goals  Acute Rehab OT Goals Patient Stated Goal: decrease pain then go home OT Goal Formulation: With patient Time For Goal Achievement: 08/27/20 Potential to Achieve Goals: Good  Plan Discharge plan remains appropriate    Co-evaluation                 AM-PAC OT "6 Clicks" Daily Activity     Outcome Measure   Help from another person eating meals?:  None Help from another person taking care of personal grooming?: A Little Help from another person toileting, which includes using toliet, bedpan, or urinal?: A Lot Help from another person bathing (including washing, rinsing, drying)?: A Lot Help from another person to put on and taking off regular upper body clothing?: A Little Help from another person to put on and taking off regular lower body clothing?: A Lot 6 Click Score: 16    End of Session    OT Visit Diagnosis: Muscle weakness (generalized) (M62.81);Unsteadiness on feet (R26.81);Pain Pain - Right/Left: Left Pain - part of body: Leg   Activity Tolerance Patient limited by pain   Patient Left in bed;with call bell/phone within reach;Other (comment) (ice applied at end of session)   Nurse Communication Mobility status        Time: 0388-8280 OT Time Calculation (min): 16 min  Charges: OT General Charges $OT Visit: 1 Visit OT Treatments $Self Care/Home Management : 8-22 mins  Pollyann Glen E. Jaquari Reckner, COTA/L Acute Rehabilitation Services [phone]   Cherlyn Cushing 08/14/2020, 2:07 PM

## 2020-08-15 LAB — TYPE AND SCREEN
ABO/RH(D): O POS
Antibody Screen: NEGATIVE
Unit division: 0

## 2020-08-15 LAB — CBC
HCT: 29.8 % — ABNORMAL LOW (ref 39.0–52.0)
Hemoglobin: 8.7 g/dL — ABNORMAL LOW (ref 13.0–17.0)
MCH: 25.4 pg — ABNORMAL LOW (ref 26.0–34.0)
MCHC: 29.2 g/dL — ABNORMAL LOW (ref 30.0–36.0)
MCV: 87.1 fL (ref 80.0–100.0)
Platelets: 423 10*3/uL — ABNORMAL HIGH (ref 150–400)
RBC: 3.42 MIL/uL — ABNORMAL LOW (ref 4.22–5.81)
RDW: 16.7 % — ABNORMAL HIGH (ref 11.5–15.5)
WBC: 9 10*3/uL (ref 4.0–10.5)
nRBC: 0 % (ref 0.0–0.2)

## 2020-08-15 LAB — BASIC METABOLIC PANEL
Anion gap: 8 (ref 5–15)
BUN: 10 mg/dL (ref 8–23)
CO2: 29 mmol/L (ref 22–32)
Calcium: 8.7 mg/dL — ABNORMAL LOW (ref 8.9–10.3)
Chloride: 101 mmol/L (ref 98–111)
Creatinine, Ser: 0.73 mg/dL (ref 0.61–1.24)
GFR, Estimated: >60 mL/min (ref >60)
Glucose, Bld: 115 mg/dL — ABNORMAL HIGH (ref 70–99)
Potassium: 3.8 mmol/L (ref 3.5–5.1)
Sodium: 138 mmol/L (ref 135–145)

## 2020-08-15 LAB — BPAM RBC
Blood Product Expiration Date: 202112062359
ISSUE DATE / TIME: 202111040123
Unit Type and Rh: 5100

## 2020-08-15 MED ORDER — OXYCODONE HCL 5 MG PO TABS
5.0000 mg | ORAL_TABLET | ORAL | Status: DC | PRN
  Administered 2020-08-15 – 2020-08-16 (×5): 10 mg via ORAL
  Filled 2020-08-15 (×5): qty 2

## 2020-08-15 MED ORDER — HYDROMORPHONE HCL 1 MG/ML IJ SOLN: 0.5000 mg | Freq: Three times a day (TID) | INTRAMUSCULAR | Status: DC | PRN

## 2020-08-15 NOTE — Progress Notes (Signed)
     Subjective: 3 Days Post-Op s/p Procedure(s): OPEN REDUCTION INTERNAL FIXATION (ORIF) BICONDYLAR TIBIAL PLATEAU REMOVAL EXTERNAL FIXATION LEG  Patient happy with pain control yesterday, oxycodone working better than PO dilaudid. No other complaints. Denies chest pain, SOB, Calf pain. No nausea/vomiting.  Objective:  PE: VITALS:   Vitals:   08/14/20 0830 08/14/20 1805 08/14/20 1936 08/15/20 0603  BP: 120/79 134/72 135/72 127/80  Pulse: 94 (!) 101 99 98  Resp: 18 20 17 17   Temp: 98.2 F (36.8 C) 98.6 F (37 C) 98 F (36.7 C) 98.4 F (36.9 C)  TempSrc: Oral Oral Oral Oral  SpO2: 95% 96% 95% 95%  Weight:      Height:       General: alert, oriented, sitting up in bed, in no acute distress Resp: no use of accessory musculature GI: abdomen soft, non-tender MSK: Dressing C/D/I. Able to move all toes of left foot, no pain with passive stretch. No TTP to left calf. + DP pulse. Sensation intact to all aspects of left foot. Dorsiflexion and Plantarflexion intact at ankle.   LABS  Results for orders placed or performed during the hospital encounter of 08/12/20 (from the past 24 hour(s))  Basic metabolic panel     Status: Abnormal   Collection Time: 08/15/20  1:27 AM  Result Value Ref Range   Sodium 138 135 - 145 mmol/L   Potassium 3.8 3.5 - 5.1 mmol/L   Chloride 101 98 - 111 mmol/L   CO2 29 22 - 32 mmol/L   Glucose, Bld 115 (H) 70 - 99 mg/dL   BUN 10 8 - 23 mg/dL   Creatinine, Ser 13/07/21 0.61 - 1.24 mg/dL   Calcium 8.7 (L) 8.9 - 10.3 mg/dL   GFR, Estimated 0.93 >81 mL/min   Anion gap 8 5 - 15  CBC     Status: Abnormal   Collection Time: 08/15/20  1:27 AM  Result Value Ref Range   WBC 9.0 4.0 - 10.5 K/uL   RBC 3.42 (L) 4.22 - 5.81 MIL/uL   Hemoglobin 8.7 (L) 13.0 - 17.0 g/dL   HCT 13/07/21 (L) 39 - 52 %   MCV 87.1 80.0 - 100.0 fL   MCH 25.4 (L) 26.0 - 34.0 pg   MCHC 29.2 (L) 30.0 - 36.0 g/dL   RDW 99.3 (H) 71.6 - 96.7 %   Platelets 423 (H) 150 - 400 K/uL   nRBC 0.0 0.0 -  0.2 %    No results found.  Assessment/Plan: Comminuted left bicondylar tibial plateau fracture - 3 days post-op ORIF of tibial plateau fracture and removal of external fixation - pain control improved, able to use only PO 10 mg oxycodone yesterday afternoon. Will d/c PO dilaudid and increase hours between available IV dilaudid doses - Weightbearing: NWB LLE x 8 weeks, unrestricted ROM at left knee except no active extension against resistance, no resting with knee in flexion - use bone foam or elevate heel on pillows. Encouraged patient to work with PT. Insicional and dressing care: PRN dressing changes - continue ice and elevation VTE prophylaxis: Xarelto x 4 weeks  Dispo Anticipate discharge on Monday  Contact information:   Weekdays 8-5 06-12-2006, PA-C [phone] A fter hours and holidays please check [email or web address] for group call information for [facility]  [phone] 08/15/2020, 8:11 AM

## 2020-08-15 NOTE — Plan of Care (Signed)
  Problem: Education: Goal: Knowledge of General Education information will improve Description: Including pain rating scale, medication(s)/side effects and non-pharmacologic comfort measures 08/15/2020 0948 by Maxwell Marion, RN Outcome: Progressing 08/15/2020 0947 by Maxwell Marion, RN Outcome: Progressing   Problem: Clinical Measurements: Goal: Ability to maintain clinical measurements within normal limits will improve 08/15/2020 0948 by Maxwell Marion, RN Outcome: Progressing 08/15/2020 0947 by Maxwell Marion, RN Outcome: Progressing Goal: Will remain free from infection 08/15/2020 0948 by Maxwell Marion, RN Outcome: Progressing 08/15/2020 0947 by Maxwell Marion, RN Outcome: Progressing Goal: Diagnostic test results will improve 08/15/2020 0948 by Maxwell Marion, RN Outcome: Progressing 08/15/2020 0947 by Maxwell Marion, RN Outcome: Progressing Goal: Respiratory complications will improve 08/15/2020 0948 by Maxwell Marion, RN Outcome: Progressing 08/15/2020 0947 by Maxwell Marion, RN Outcome: Progressing Goal: Cardiovascular complication will be avoided 08/15/2020 0948 by Maxwell Marion, RN Outcome: Progressing 08/15/2020 0947 by Maxwell Marion, RN Outcome: Progressing

## 2020-08-15 NOTE — Plan of Care (Signed)

## 2020-08-16 ENCOUNTER — Other Ambulatory Visit (HOSPITAL_COMMUNITY): Payer: Self-pay | Admitting: Orthopedic Surgery

## 2020-08-16 LAB — CBC
HCT: 29.3 % — ABNORMAL LOW (ref 39.0–52.0)
Hemoglobin: 8.7 g/dL — ABNORMAL LOW (ref 13.0–17.0)
MCH: 26 pg (ref 26.0–34.0)
MCHC: 29.7 g/dL — ABNORMAL LOW (ref 30.0–36.0)
MCV: 87.7 fL (ref 80.0–100.0)
Platelets: 404 10*3/uL — ABNORMAL HIGH (ref 150–400)
RBC: 3.34 MIL/uL — ABNORMAL LOW (ref 4.22–5.81)
RDW: 16.7 % — ABNORMAL HIGH (ref 11.5–15.5)
WBC: 7.2 10*3/uL (ref 4.0–10.5)
nRBC: 0 % (ref 0.0–0.2)

## 2020-08-16 MED ORDER — OXYCODONE-ACETAMINOPHEN 10-325 MG PO TABS
1.0000 | ORAL_TABLET | Freq: Four times a day (QID) | ORAL | 0 refills | Status: DC | PRN
Start: 2020-08-16 — End: 2020-08-16

## 2020-08-16 MED ORDER — RIVAROXABAN 15 MG PO TABS: 15.0000 mg | ORAL_TABLET | Freq: Every day | ORAL | 0 refills | Status: DC

## 2020-08-16 MED ORDER — DOCUSATE SODIUM 100 MG PO CAPS: 100.0000 mg | ORAL_CAPSULE | Freq: Two times a day (BID) | ORAL | 0 refills | Status: DC

## 2020-08-16 MED ORDER — VITAMIN D3 25 MCG PO TABS: 2000.0000 [IU] | ORAL_TABLET | Freq: Every day | ORAL | 1 refills | Status: DC

## 2020-08-16 MED ORDER — RIVAROXABAN 10 MG PO TABS: 10.0000 mg | ORAL_TABLET | Freq: Every day | ORAL | 0 refills | Status: DC

## 2020-08-16 MED ORDER — METHOCARBAMOL 500 MG PO TABS
500.0000 mg | ORAL_TABLET | Freq: Four times a day (QID) | ORAL | 1 refills | Status: DC | PRN
Start: 2020-08-16 — End: 2020-12-17

## 2020-08-16 MED FILL — DOCUSATE SODIUM 100 MG CAPS: 100 | 5 days supply | Qty: 10 | Fill #0

## 2020-08-16 MED FILL — VITAMIN D3 25 MCG TABS: 25 | 30 days supply | Qty: 60 | Fill #0

## 2020-08-16 MED FILL — OXYCODONE-APAP 10-325: 10-325 | 6 days supply | Qty: 40 | Fill #0

## 2020-08-16 MED FILL — METHOCARBAMOL 500 MG TABS: 500 | 7 days supply | Qty: 60 | Fill #0

## 2020-08-16 MED FILL — XARELTO 15 MG TABLET: 15 | 30 days supply | Qty: 30 | Fill #0

## 2020-08-16 NOTE — Care Management Important Message (Signed)
Important Message  Patient Details  Name: Brian Atkinson MRN: [id number] Date of Birth: 08/17/1951   Medicare Important Message Given:  Yes     Eshan Trupiano P Kalen Ratajczak 08/16/2020, 2:14 PM

## 2020-08-16 NOTE — Progress Notes (Signed)
[facility] Progress Note  Patient ID: Brian Atkinson MRN: [id number] DOB/AGE: 11-12-1950 69 y.o.  Subjective:  Doing well Ready to go home  Oxycodone lasts much longer than PO dilaudid No other complaints or concerns  ROS As above  Objective:   VITALS:   Vitals:   08/15/20 1709 08/15/20 1940 08/16/20 0300 08/16/20 0855  BP: 132/80 138/68 125/62 126/71  Pulse: 96 (!) 102 92 (!) 104  Resp: 19 17 17 17   Temp: 98.1 F (36.7 C) 98.5 F (36.9 C) 98.3 F (36.8 C) 98.9 F (37.2 C)  TempSrc: Oral Oral Oral Oral  SpO2: 96% 96% 99% 98%  Weight:      Height:        Estimated body mass index is 32.61 kg/m as calculated from the following:   Height as of this encounter: 5' 10.98" (1.803 m).   Weight as of this encounter: 106 kg.   Intake/Output      11/07 0701 - 11/08 0700 11/08 0701 - 11/09 0700   P.O. 720    Total Intake(mL/kg) 720 (6.8)    Urine (mL/kg/hr) 2350 (0.9)    Total Output 2350    Net -1630           LABS  Results for orders placed or performed during the hospital encounter of 08/12/20 (from the past 24 hour(s))  CBC     Status: Abnormal   Collection Time: 08/16/20  2:35 AM  Result Value Ref Range   WBC 7.2 4.0 - 10.5 K/uL   RBC 3.34 (L) 4.22 - 5.81 MIL/uL   Hemoglobin 8.7 (L) 13.0 - 17.0 g/dL   HCT 13/08/21 (L) 39 - 52 %   MCV 87.7 80.0 - 100.0 fL   MCH 26.0 26.0 - 34.0 pg   MCHC 29.7 (L) 30.0 - 36.0 g/dL   RDW 47.4 (H) 25.9 - 56.3 %   Platelets 404 (H) 150 - 400 K/uL   nRBC 0.0 0.0 - 0.2 %     PHYSICAL EXAM:   Gen: sitting up in bed, comfortable appearing, NAD Lungs: unlabored, clear Cardiac: RRR Ext:       Left Lower Extremity              Dressings c/d/i  Dressings removed   All wounds look great   pinsites from ex fix are stable             Ext warm              + DP pulse             No DCT             DPN, SPN, TN sensation intact              Ankle flexion, extension, inversion and eversion intact             No pain out of proportion with passive stretching              Swelling much improved   Assessment/Plan: 4 Days Post-Op   Principal Problem:   Closed bicondylar fracture of left tibial plateau Active Problems:   History of MRSA infection left patella    Anti-infectives (From admission, onward)   Start     Dose/Rate Route Frequency Ordered Stop  08/12/20 1700  ceFAZolin (ANCEF) IVPB 2g/100 mL premix        2 g 200 mL/hr over 30 Minutes Intravenous Every 6 hours 08/12/20 1558 08/13/20 0435   08/12/20 0939  vancomycin (VANCOCIN) powder  Status:  Discontinued          As needed 08/12/20 0940 08/12/20 1234   08/12/20 0730  ceFAZolin (ANCEF) IVPB 2g/100 mL premix        2 g 200 mL/hr over 30 Minutes Intravenous On call to O.R. 08/12/20 0717 08/12/20 0820   08/12/20 0722  ceFAZolin (ANCEF) 2-4 GM/100ML-% IVPB       Note to Pharmacy: Marcelle Smiling   : cabinet override      08/12/20 0722 08/12/20 0823    .  POD/HD#: 93  69 year old male with comminuted left bicondylar tibial plateau fracture   -Comminuted left bicondylar tibial plateau fracture s/p ORIF and removal of external fixator.  Repair of left tibial tubercle             Nonweightbearing left leg x 8 weeks             Unrestricted range of motion left knee except no active extension against resistance             Dressing changed today   Change again on 08/18/2020   Can start to shower at that time               Continue with aggressive ice and elevation             No resting with knee in flexion.  Use bone foam or elevate heel on pillows             PT and OT  TED hose                            PT- please teach HEP for L knee ROM- AROM, PROM. Prone exercises as well. No ROM restrictions except no active knee extension against resistance.  Quad sets, SLR, LAQ, SAQ, heel slides, stretching, prone flexion and extension               Ankle  theraband program, heel cord stretching, toe towel curls, etc   No pillows under bend of knee when at rest, ok to place under heel to help work on extension. Can also use zero knee bone foam if available     - Pain management:             dc home on percocet    - ABL anemia/Hemodynamics            stable   - Medical issues              No chronic issues   - DVT/PE prophylaxis:             Xarelto x 4 weeks   - ID:              Perioperative antibiotics completed    - Metabolic Bone Disease:             Vitamin D levels look good.  Will back down on his vitamin D regimen.  His home medication list indicate that he is taking 10,000 IUs of vitamin D3 a day.  Will back him down to 2000 IUs daily   - Activity:             Nonweightbearing  left leg             Therapy evaluations   - FEN/GI prophylaxis/Foley/Lines:             Advance diet             NSL    - Impediments to fracture healing:             Complex intra-articular injury    - Dispo:             discharge home today   Follow up in 2 weeks   Brian Latin, PA-C [phone] (C) 08/16/2020, 10:07 AM  [facility] [address] [place] [address] [phone] [place]36 [phone] (F)    After 5pm and on the weekends please log on to Amion, go to orthopaedics and the look under the [facility] Call for the provider(s) on call. You can also call our office at [phone] and then follow the prompts to be connected to the call team.

## 2020-08-16 NOTE — Discharge Summary (Signed)
Orthopaedic Trauma Service (OTS) Discharge Summary   Patient ID: Brian Atkinson MRN: [id number] DOB/AGE: [birth year] 69 y.o.  Admit date: 08/12/2020 Discharge date: 08/16/2020  Admission Diagnoses: Left bicondylar tibial plateau fracture Retained external fixator left leg History of MRSA infection left patella  Discharge Diagnoses:  Principal Problem:   Closed bicondylar fracture of left tibial plateau Active Problems:   History of MRSA infection left patella    Past Medical History:  Diagnosis Date  . Acute osteomyelitis of patella (HCC)   . Complication of anesthesia    post-op memory problems  . History of MRSA infection   . Patella fracture    left      Procedures Performed: 08/12/2020-Dr. Handy 1. ORIF OF LEFT BICONDYLAR TIBIAL PLATEAU FRACTURE  2. ORIF TIBIAL EMINENCE 3. ARTHROTOMY WITH PARTIAL MENISCECTOMY LEFT LATERAL MENISCUS 4. ANTERIOR COMPARTMENT FASCIOTOMY 5. REMOVAL OF EXTERNAL FIXATOR UNDER ANESTHESIA 6. CURETTAGE OF ULCERATED PIN SITES INCLUDING BONE OF TIBIA AND FEMUR   Discharged Condition: good  Hospital Course:   Patient is a 69 year old male well-known to the orthopedic trauma service who sustained a complex bicondylar tibial plateau fracture to his left knee approximately 2 weeks ago.  Due to the swelling he was temporized with a spanning external fixator.  His swelling eventually subsided enough to allow for safe surgical intervention.  Patient was taken to the OR on 08/12/2020 for the procedure noted above.  After surgery he was transferred to the PACU for recovery from anesthesia and then transferred to the orthopedic floor for observation, pain control therapies.  Patient's hospital stay was really uncomplicated and took an expected clinical course.  He was still requiring IV pain medication for the first 2 postoperative days.  We did try him on oral Dilaudid as there is some questionable effect between hydrocodone and oxycodone  along with some occasional itching with those 2 medications however the Dilaudid was completely ineffective for him and he was more than happy to deal with mild pruritus with improved pain control.  He was transitioned over to oral oxycodone on postoperative day #3.  He continue to work with therapies on a regular basis and did well..  In the perioperative period he was on Lovenox for DVT and PE prophylaxis and was transitioned to Xarelto following his definitive procedure.  He will remain on Xarelto for 30 days.  We did recheck his vitamin D levels which were elevated from his labs from several weeks ago it does appear that he was taking more than what was prescribed for him at discharge and will back him down to 2000 IUs of vitamin D3 daily instead of the reported 10,000 IUs daily that he was taking.  Patient discharged in stable condition on 08/16/2020.  Dressings were changed prior to discharge she can change them again in another 2 days and then shower at that time.  Again we discussed the importance of achieving full extension ASAP and working on flexion to 90 degrees x 4 weeks postoperatively.  Follow-up with orthopedic trauma service in 2 weeks for follow-up x-rays and removal of his sutures.  He will contact us with any questions or concerns including signs of symptoms of infection.  Patient is in agreement with the plan moving forward and discharged on 08/16/2020 in stable condition.  At the time of discharge patient tolerating regular diet, voiding and had bowel movement  Consults: None  Significant Diagnostic Studies: labs:   Results for DERRY, KASSEL (MRN [id number]) as of 08/16/2020 10:40  Ref. Range 08/13/2020 02:57 08/14/2020 05:11 08/15/2020 01:27 08/16/2020 02:35  Sodium Latest Ref Range: 135 - 145 mmol/L 137 136 138   Potassium Latest Ref Range: 3.5 - 5.1 mmol/L 4.2 3.9 3.8   Chloride Latest Ref Range: 98 - 111 mmol/L 104 104 101   CO2 Latest Ref Range: 22 - 32 mmol/L 25 26 29      Glucose Latest Ref Range: 70 - 99 mg/dL 161134 (H) 096104 (H) 045115 (H)   BUN Latest Ref Range: 8 - 23 mg/dL 12 8 10    Creatinine Latest Ref Range: 0.61 - 1.24 mg/dL 4.090.89 8.110.77 9.140.73   Calcium Latest Ref Range: 8.9 - 10.3 mg/dL 8.4 (L) 8.4 (L) 8.7 (L)   Anion gap Latest Ref Range: 5 - 15  8 6 8    Alkaline Phosphatase Latest Ref Range: 38 - 126 U/L 83     Albumin Latest Ref Range: 3.5 - 5.0 g/dL 2.9 (L)     AST Latest Ref Range: 15 - 41 U/L 22     ALT Latest Ref Range: 0 - 44 U/L 17     Total Protein Latest Ref Range: 6.5 - 8.1 g/dL 5.9 (L)     Total Bilirubin Latest Ref Range: 0.3 - 1.2 mg/dL 0.2 (L)     GFR, Estimated Latest Ref Range: >60 mL/min >60 >60 >60   WBC Latest Ref Range: 4.0 - 10.5 K/uL 11.9 (H) 9.2 9.0 7.2  RBC Latest Ref Range: 4.22 - 5.81 MIL/uL 3.11 (L) 3.21 (L) 3.42 (L) 3.34 (L)  Hemoglobin Latest Ref Range: 13.0 - 17.0 g/dL 8.2 (L) 8.4 (L) 8.7 (L) 8.7 (L)  HCT Latest Ref Range: 39 - 52 % 27.3 (L) 28.1 (L) 29.8 (L) 29.3 (L)  MCV Latest Ref Range: 80.0 - 100.0 fL 87.8 87.5 87.1 87.7  MCH Latest Ref Range: 26.0 - 34.0 pg 26.4 26.2 25.4 (L) 26.0  MCHC Latest Ref Range: 30.0 - 36.0 g/dL 78.230.0 95.629.9 (L) 21.329.2 (L) 29.7 (L)  RDW Latest Ref Range: 11.5 - 15.5 % 16.6 (H) 16.8 (H) 16.7 (H) 16.7 (H)  Platelets Latest Ref Range: 150 - 400 K/uL 448 (H) 392 423 (H) 404 (H)  nRBC Latest Ref Range: 0.0 - 0.2 % 0.0 0.0 0.0 0.0   Results for Jacquelyne BalintSTONE, Rayjon CHARLES (MRN [id number]) as of 08/16/2020 10:40  Ref. Range 08/12/2020 07:09  Vitamin D, 25-Hydroxy Latest Ref Range: 30 - 100 ng/mL 73.99   Treatments: IV hydration, antibiotics: Ancef, analgesia: acetaminophen, Dilaudid and oxy IR, anticoagulation: LMW heparin and xarelto, therapies: PT, OT and RN and surgery: as above  Discharge Exam:             Orthopaedic Trauma Service Progress Note   Patient ID: Jacquelyne BalintVictor Charles Renda MRN: [id number] DOB/AGE: May 26, 1951 69 y.o.   Subjective:   Doing well Ready to go home  Oxycodone lasts much longer  than PO dilaudid No other complaints or concerns   ROS As above   Objective:    VITALS:         Vitals:    08/15/20 1709 08/15/20 1940 08/16/20 0300 08/16/20 0855  BP: 132/80 138/68 125/62 126/71  Pulse: 96 (!) 102 92 (!) 104  Resp: 19 17 17 17   Temp: 98.1 F (36.7 C) 98.5 F (36.9 C) 98.3 F (36.8 C) 98.9 F (37.2 C)  TempSrc: Oral Oral Oral Oral  SpO2: 96% 96% 99% 98%  Weight:          Height:  Estimated body mass index is 32.61 kg/m as calculated from the following:   Height as of this encounter: 5' 10.98" (1.803 m).   Weight as of this encounter: 106 kg.     Intake/Output      11/07 0701 - 11/08 0700 11/08 0701 - 11/09 0700   P.O. 720    Total Intake(mL/kg) 720 (6.8)    Urine (mL/kg/hr) 2350 (0.9)    Total Output 2350    Net -1630            LABS   Lab Results Last 24 Hours       Results for orders placed or performed during the hospital encounter of 08/12/20 (from the past 24 hour(s))  CBC     Status: Abnormal    Collection Time: 08/16/20  2:35 AM  Result Value Ref Range    WBC 7.2 4.0 - 10.5 K/uL    RBC 3.34 (L) 4.22 - 5.81 MIL/uL    Hemoglobin 8.7 (L) 13.0 - 17.0 g/dL    HCT 40.9 (L) 39 - 52 %    MCV 87.7 80.0 - 100.0 fL    MCH 26.0 26.0 - 34.0 pg    MCHC 29.7 (L) 30.0 - 36.0 g/dL    RDW 81.1 (H) 91.4 - 15.5 %    Platelets 404 (H) 150 - 400 K/uL    nRBC 0.0 0.0 - 0.2 %          PHYSICAL EXAM:    Gen: sitting up in bed, comfortable appearing, NAD Lungs: unlabored, clear Cardiac: RRR Ext:       Left Lower Extremity              Dressings c/d/i             Dressings removed                         All wounds look great                         pinsites from ex fix are stable             Ext warm              + DP pulse             No DCT             DPN, SPN, TN sensation intact             Ankle flexion, extension, inversion and eversion intact             No pain out of proportion with passive stretching               Swelling much improved    Assessment/Plan: 4 Days Post-Op    Principal Problem:   Closed bicondylar fracture of left tibial plateau Active Problems:   History of MRSA infection left patella                 Anti-infectives (From admission, onward)      Start     Dose/Rate Route Frequency Ordered Stop    08/12/20 1700   ceFAZolin (ANCEF) IVPB 2g/100 mL premix        2 g 200 mL/hr over 30 Minutes Intravenous Every 6 hours 08/12/20 1558 08/13/20 0435    08/12/20 0939   vancomycin (VANCOCIN) powder  Status:  Discontinued  As needed 08/12/20 0940 08/12/20 1234    08/12/20 0730   ceFAZolin (ANCEF) IVPB 2g/100 mL premix        2 g 200 mL/hr over 30 Minutes Intravenous On call to O.R. 08/12/20 0717 08/12/20 0820    08/12/20 0722   ceFAZolin (ANCEF) 2-4 GM/100ML-% IVPB       Note to Pharmacy: Marcelle Smiling   : cabinet override         08/12/20 0722 08/12/20 0823       .   POD/HD#: 6   69 year old male with comminuted left bicondylar tibial plateau fracture   -Comminuted left bicondylar tibial plateau fracture s/p ORIF and removal of external fixator.  Repair of left tibial tubercle             Nonweightbearing left leg x 8 weeks             Unrestricted range of motion left knee except no active extension against resistance             Dressing changed today                         Change again on 08/18/2020                         Can start to shower at that time                Continue with aggressive ice and elevation             No resting with knee in flexion.  Use bone foam or elevate heel on pillows             PT and OT             TED hose                            PT- please teach HEP for L knee ROM- AROM, PROM. Prone exercises as well. No ROM restrictions except no active knee extension against resistance.  Quad sets, SLR, LAQ, SAQ, heel slides, stretching, prone flexion and extension               Ankle theraband program, heel cord stretching, toe  towel curls, etc   No pillows under bend of knee when at rest, ok to place under heel to help work on extension. Can also use zero knee bone foam if available     - Pain management:             dc home on percocet    - ABL anemia/Hemodynamics            stable   - Medical issues              No chronic issues   - DVT/PE prophylaxis:             Xarelto x 4 weeks   - ID:              Perioperative antibiotics completed    - Metabolic Bone Disease:             Vitamin D levels look good.  Will back down on his vitamin D regimen.  His home medication list indicate that he is taking 10,000 IUs of vitamin D3 a day.  Will back him down to 2000 IUs daily   -  Activity:             Nonweightbearing left leg             Therapy evaluations   - FEN/GI prophylaxis/Foley/Lines:             Advance diet             NSL    - Impediments to fracture healing:             Complex intra-articular injury    - Dispo:             discharge home today              Follow up in 2 weeks   Disposition: Discharge disposition: 01-Home or Self Care       Discharge Instructions    Call MD / Call 911   Complete by: As directed    If you experience chest pain or shortness of breath, CALL 911 and be transported to the hospital emergency room.  If you develope a fever above 101 F, pus (white drainage) or increased drainage or redness at the wound, or calf pain, call your surgeon's office.   Constipation Prevention   Complete by: As directed    Drink plenty of fluids.  Prune juice may be helpful.  You may use a stool softener, such as Colace (over the counter) 100 mg twice a day.  Use MiraLax (over the counter) for constipation as needed.   Diet general   Complete by: As directed    Discharge instructions   Complete by: As directed    Orthopaedic Trauma Service Discharge Instructions   General Discharge Instructions  Orthopaedic Injuries:  Left tibial plateau fracture treated with open  reduction internal fixation using plate and screws  WEIGHT BEARING STATUS: Nonweightbearing left leg  RANGE OF MOTION/ACTIVITY: Unrestricted range of motion of the left knee and ankle.  No active knee extension against resistance.  Activity as tolerated while maintaining restrictions.  Perform exercises that have been reviewed with you by therapy.  At rest keep your knee fully straight.  Use either the bone firm or pillows under the ankle.  Do not let me knee rest in slight flexion (bent) as this can cause contracture  Bone health: Scale back on your vitamin D supplementation.  Take 2000 IUs daily.  Recheck labs in about 4 weeks  Wound Care: Daily wound care starting on 08/18/2020.  Please see below.  Use TED hose for swelling control  Discharge Wound Care Instructions  Do NOT apply any ointments, solutions or lotions to pin sites or surgical wounds.  These prevent needed drainage and even though solutions like hydrogen peroxide kill bacteria, they also damage cells lining the pin sites that help fight infection.  Applying lotions or ointments can keep the wounds moist and can cause them to breakdown and open up as well. This can increase the risk for infection. When in doubt call the office.  Surgical incisions should be dressed daily.  If any drainage is noted, use one layer of adaptic, then gauze, Kerlix, and an ace wrap.  Once the incision is completely dry and without drainage, it may be left open to air out.  Showering may begin 36-48 hours later.  Cleaning gently with soap and water.  Traumatic wounds should be dressed daily as well.    One layer of adaptic, gauze, Kerlix, then ace wrap.  The adaptic can be discontinued once the draining has ceased  If you have a wet to dry dressing: wet the gauze with saline the squeeze as much saline out so the gauze is moist (not soaking wet), place moistened gauze over wound, then place a dry gauze over the moist one, followed by Kerlix wrap, then  ace wrap.   DVT/PE prophylaxis: Xarelto 10 mg daily x 4 weeks   Diet: as you were eating previously.  Can use over the counter stool softeners and bowel preparations, such as Miralax, to help with bowel movements.  Narcotics can be constipating.  Be sure to drink plenty of fluids  PAIN MEDICATION USE AND EXPECTATIONS  You have likely been given narcotic medications to help control your pain.  After a traumatic event that results in an fracture (broken bone) with or without surgery, it is ok to use narcotic pain medications to help control one's pain.  We understand that everyone responds to pain differently and each individual patient will be evaluated on a regular basis for the continued need for narcotic medications. Ideally, narcotic medication use should last no more than 6-8 weeks (coinciding with fracture healing).   As a patient it is your responsibility as well to monitor narcotic medication use and report the amount and frequency you use these medications when you come to your office visit.   We would also advise that if you are using narcotic medications, you should take a dose prior to therapy to maximize you participation.  IF YOU ARE ON NARCOTIC MEDICATIONS IT IS NOT PERMISSIBLE TO OPERATE A MOTOR VEHICLE (MOTORCYCLE/CAR/TRUCK/MOPED) OR HEAVY MACHINERY DO NOT MIX NARCOTICS WITH OTHER CNS (CENTRAL NERVOUS SYSTEM) DEPRESSANTS SUCH AS ALCOHOL   STOP SMOKING OR USING NICOTINE PRODUCTS!!!!  As discussed nicotine severely impairs your body's ability to heal surgical and traumatic wounds but also impairs bone healing.  Wounds and bone heal by forming microscopic blood vessels (angiogenesis) and nicotine is a vasoconstrictor (essentially, shrinks blood vessels).  Therefore, if vasoconstriction occurs to these microscopic blood vessels they essentially disappear and are unable to deliver necessary nutrients to the healing tissue.  This is one modifiable factor that you can do to dramatically  increase your chances of healing your injury.    (This means no smoking, no nicotine gum, patches, etc)  DO NOT USE NONSTEROIDAL ANTI-INFLAMMATORY DRUGS (NSAID'S)  Using products such as Advil (ibuprofen), Aleve (naproxen), Motrin (ibuprofen) for additional pain control during fracture healing can delay and/or prevent the healing response.  If you would like to take over the counter (OTC) medication, Tylenol (acetaminophen) is ok.  However, some narcotic medications that are given for pain control contain acetaminophen as well. Therefore, you should not exceed more than 4000 mg of tylenol in a day if you do not have liver disease.  Also note that there are may OTC medicines, such as cold medicines and allergy medicines that my contain tylenol as well.  If you have any questions about medications and/or interactions please ask your doctor/PA or your pharmacist.      ICE AND ELEVATE INJURED/OPERATIVE EXTREMITY  Using ice and elevating the injured extremity above your heart can help with swelling and pain control.  Icing in a pulsatile fashion, such as 20 minutes on and 20 minutes off, can be followed.    Do not place ice directly on skin. Make sure there is a barrier between to skin and the ice pack.    Using frozen items such as frozen peas works well as the conform nicely to the are that needs  to be iced.  USE AN ACE WRAP OR TED HOSE FOR SWELLING CONTROL  In addition to icing and elevation, Ace wraps or TED hose are used to help limit and resolve swelling.  It is recommended to use Ace wraps or TED hose until you are informed to stop.    When using Ace Wraps start the wrapping distally (farthest away from the body) and wrap proximally (closer to the body)   Example: If you had surgery on your leg or thing and you do not have a splint on, start the ace wrap at the toes and work your way up to the thigh        If you had surgery on your upper extremity and do not have a splint on, start the ace wrap at  your fingers and work your way up to the upper arm  IF YOU ARE IN A SPLINT OR CAST DO NOT REMOVE IT FOR ANY REASON   If your splint gets wet for any reason please contact the office immediately. You may shower in your splint or cast as long as you keep it dry.  This can be done by wrapping in a cast cover or garbage back (or similar)  Do Not stick any thing down your splint or cast such as pencils, money, or hangers to try and scratch yourself with.  If you feel itchy take benadryl as prescribed on the bottle for itching  IF YOU ARE IN A CAM BOOT (BLACK BOOT)  You may remove boot periodically. Perform daily dressing changes as noted below.  Wash the liner of the boot regularly and wear a sock when wearing the boot. It is recommended that you sleep in the boot until told otherwise    Call office for the following: Temperature greater than 101F Persistent nausea and vomiting Severe uncontrolled pain Redness, tenderness, or signs of infection (pain, swelling, redness, odor or green/yellow discharge around the site) Difficulty breathing, headache or visual disturbances Hives Persistent dizziness or light-headedness Extreme fatigue Any other questions or concerns you may have after discharge  In an emergency, call 911 or go to an Emergency Department at a nearby hospital  HELPFUL INFORMATION  If you had a block, it will wear off between 8-24 hrs postop typically.  This is period when your pain may go from nearly zero to the pain you would have had postop without the block.  This is an abrupt transition but nothing dangerous is happening.  You may take an extra dose of narcotic when this happens.  You should wean off your narcotic medicines as soon as you are able.  Most patients will be off or using minimal narcotics before their first postop appointment.   We suggest you use the pain medication the first night prior to going to bed, in order to ease any pain when the anesthesia wears off.  You should avoid taking pain medications on an empty stomach as it will make you nauseous.  Do not drink alcoholic beverages or take illicit drugs when taking pain medications.  In most states it is against the law to drive while you are in a splint or sling.  And certainly against the law to drive while taking narcotics.  You may return to work/school in the next couple of days when you feel up to it.   Pain medication may make you constipated.  Below are a few solutions to try in this order: Decrease the amount of pain medication if you aren't  having pain. Drink lots of decaffeinated fluids. Drink prune juice and/or each dried prunes  If the first 3 don't work start with additional solutions Take Colace - an over-the-counter stool softener Take Senokot - an over-the-counter laxative Take Miralax - a stronger over-the-counter laxative     CALL THE OFFICE WITH ANY QUESTIONS OR CONCERNS: [phone]   VISIT OUR WEBSITE FOR ADDITIONAL INFORMATION: [email or web address]   Do not put a pillow under the knee. Place it under the heel.   Complete by: As directed    Driving restrictions   Complete by: As directed    No driving until further notice   Increase activity slowly as tolerated   Complete by: As directed    Non weight bearing   Complete by: As directed    Laterality: left   Extremity: Lower   Place TED hose   Complete by: As directed    Please provide pt with pair of knee high ted hose prior to discharge. Do not have to put on     Allergies as of 08/16/2020      Reactions   Versed [midazolam]    Memory problems      Medication List    STOP taking these medications   enoxaparin 40 MG/0.4ML injection Commonly known as: LOVENOX   oxyCODONE-acetaminophen 5-325 MG tablet Commonly known as: PERCOCET/ROXICET Replaced by: oxyCODONE-acetaminophen 10-325 MG tablet     TAKE these medications   acetaminophen 500 MG tablet Commonly known as: TYLENOL Take 1 tablet (500 mg  total) by mouth every 12 (twelve) hours.   ascorbic acid 500 MG tablet Commonly known as: VITAMIN C Take 1,000 mg by mouth daily.   docusate sodium 100 MG capsule Commonly known as: COLACE Take 1 capsule (100 mg total) by mouth 2 (two) times daily.   ferrous sulfate 325 (65 FE) MG tablet Take 1 tablet (325 mg total) by mouth 3 (three) times daily with meals.   loratadine 10 MG tablet Commonly known as: CLARITIN Take 10 mg by mouth daily as needed for allergies.   methocarbamol 500 MG tablet Commonly known as: ROBAXIN Take 1-2 tablets (500-1,000 mg total) by mouth every 6 (six) hours as needed for muscle spasms.   MULTIVITAMIN ADULT PO Take 1 tablet by mouth daily.   oxyCODONE-acetaminophen 10-325 MG tablet Commonly known as: Percocet Take 1-1.5 tablets by mouth every 6 (six) hours as needed for up to 7 days for pain (moderate to severe pain). Replaces: oxyCODONE-acetaminophen 5-325 MG tablet   rivaroxaban 10 MG Tabs tablet Commonly known as: XARELTO Take 1 tablet (10 mg total) by mouth daily. Start taking on: August 17, 2020   Vitamin D3 25 MCG tablet Commonly known as: Vitamin D Take 2 tablets (2,000 Units total) by mouth daily. Start taking on: August 17, 2020   Zinc 100 MG Tabs Take 100 mg by mouth daily.            Discharge Care Instructions  (From admission, onward)         Start     Ordered   08/16/20 0000  Non weight bearing       Question Answer Comment  Laterality left   Extremity Lower      08/16/20 1035          Follow-up Information    Myrene Galas, MD. Schedule an appointment as soon as possible for a visit in 2 week(s).   Specialty: Orthopedic Surgery Contact information: [address] [place] [address] [phone]  Discharge Instructions and Plan:  69 year old male complex left bicondylar tibial plateau fracture status post ORIF  Weightbearing: NWB LLE Insicional and dressing care: Daily  dressing changes with 4 x 4 gauze and tape starting on 08/18/2020.  Comprehensive wound care information including in his discharge paperwork.  Also reviewed with the patient Orthopedic device(s): , Walker, wheelchair Showering: Okay to shower once wounds are dry.  Clean wounds with soap and water only.  No ointments lotions or solutions VTE prophylaxis: xarelto 10 mg daily x  4 weeks Pain control: Multimodal: Tylenol, Percocet, Robaxin Bone Health/Optimization: Scaled back on vitamin D supplementation to 2000 IUs of vitamin D3 daily.  Recheck labs in 4 weeks Follow - up plan: 2 weeks Contact information: Myrene Galas MD, Montez Morita PA-C  Signed:  [name], PA-C [phone] (C) 08/16/2020, 10:36 AM  [facility] [address] [place] [address] [phone] Collier Bullock (F)

## 2020-09-22 ENCOUNTER — Other Ambulatory Visit: Payer: Self-pay | Admitting: Orthopedic Surgery

## 2020-09-22 ENCOUNTER — Other Ambulatory Visit (HOSPITAL_COMMUNITY): Payer: Self-pay | Admitting: Orthopedic Surgery

## 2020-09-22 DIAGNOSIS — M79662 Pain in left lower leg: Secondary | ICD-10-CM

## 2020-09-23 ENCOUNTER — Ambulatory Visit (HOSPITAL_COMMUNITY)
Admission: RE | Admit: 2020-09-23 | Discharge: 2020-09-23 | Disposition: A | Discharge status: Home / Self Care | Payer: Medicare Other | Source: Ambulatory Visit | Attending: Orthopedic Surgery | Admitting: Orthopedic Surgery

## 2020-09-23 ENCOUNTER — Other Ambulatory Visit: Payer: Self-pay

## 2020-09-23 DIAGNOSIS — M79662 Pain in left lower leg: Secondary | ICD-10-CM

## 2020-09-23 DIAGNOSIS — M7989 Other specified soft tissue disorders: Secondary | ICD-10-CM | POA: Insufficient documentation

## 2020-11-25 ENCOUNTER — Other Ambulatory Visit (HOSPITAL_COMMUNITY): Payer: Self-pay | Admitting: Orthopedic Surgery

## 2020-11-25 ENCOUNTER — Other Ambulatory Visit: Payer: Self-pay | Admitting: Orthopedic Surgery

## 2020-11-25 DIAGNOSIS — S82112D Displaced fracture of left tibial spine, subsequent encounter for closed fracture with routine healing: Secondary | ICD-10-CM

## 2020-12-01 ENCOUNTER — Other Ambulatory Visit: Payer: Self-pay

## 2020-12-01 ENCOUNTER — Ambulatory Visit (HOSPITAL_COMMUNITY)
Admission: RE | Admit: 2020-12-01 | Discharge: 2020-12-01 | Disposition: A | Discharge status: Home / Self Care | Payer: Medicare Other | Source: Ambulatory Visit | Attending: Orthopedic Surgery | Admitting: Orthopedic Surgery

## 2020-12-01 DIAGNOSIS — S82112D Displaced fracture of left tibial spine, subsequent encounter for closed fracture with routine healing: Secondary | ICD-10-CM

## 2020-12-10 ENCOUNTER — Other Ambulatory Visit (HOSPITAL_COMMUNITY)
Admission: RE | Admit: 2020-12-10 | Discharge: 2020-12-10 | Disposition: A | Discharge status: Home / Self Care | Payer: Medicare Other | Source: Ambulatory Visit | Attending: Orthopedic Surgery | Admitting: Orthopedic Surgery

## 2020-12-10 DIAGNOSIS — Z01812 Encounter for preprocedural laboratory examination: Secondary | ICD-10-CM | POA: Diagnosis present

## 2020-12-10 DIAGNOSIS — Z20822 Contact with and (suspected) exposure to covid-19: Secondary | ICD-10-CM | POA: Insufficient documentation

## 2020-12-10 LAB — SARS CORONAVIRUS 2 (TAT 6-24 HRS): SARS Coronavirus 2: NEGATIVE

## 2020-12-11 ENCOUNTER — Other Ambulatory Visit (HOSPITAL_COMMUNITY): Payer: BLUE CROSS/BLUE SHIELD

## 2020-12-13 ENCOUNTER — Other Ambulatory Visit: Payer: Self-pay

## 2020-12-13 ENCOUNTER — Encounter (HOSPITAL_COMMUNITY): Payer: Self-pay | Admitting: Orthopedic Surgery

## 2020-12-13 NOTE — H&P (Signed)
Orthopaedic Trauma Service (OTS) Consult/H&P  Patient ID: Brian Atkinson MRN: [id number] DOB/AGE: Jun 24, 1951 70 y.o.    HPI: Brian Atkinson is an 70 y.o. male well known to OTS for numerous injuries. pts most recent injury was a L tibial plateau fractures sustained 07/27/2020. Treated in staged fashion with ex fix followed by ORIF 08/12/2020.  Pt has unfortunately developed a nonunion. Confirmed with CT scan.  Pt resents today to address this nonunion.  Further non-op management not in pts best interest.   Past Medical History:  Diagnosis Date  . Acute osteomyelitis of patella (HCC)   . Complication of anesthesia    post-op memory problems  . History of MRSA infection   . Medical history non-contributory   . Patella fracture    left     Past Surgical History:  Procedure Laterality Date  . BONE EXCISION Left 09/25/2017   Procedure: PARTIAL EXCISION OF LEFT PATELLA;  Surgeon: Myrene Galas, MD;  Location: [facility] OR;  Service: Orthopedics;  Laterality: Left;  . CATARACT EXTRACTION W/ INTRAOCULAR LENS  IMPLANT, BILATERAL    . COLONOSCOPY    . DEBRIDEMENT AND CLOSURE WOUND Left 06/19/2014   Procedure: DEBRIDEMENT AND CLOSURE LEFT KNEE;  Surgeon: Kathryne Hitch, MD;  Location: [facility] ORS;  Service: Orthopedics;  Laterality: Left;  . EXTERNAL FIXATION LEG Left 07/23/2020   Procedure: EXTERNAL FIXATION LEG;  Surgeon: Yolonda Kida, MD;  Location: [facility] OR;  Service: Orthopedics;  Laterality: Left;  . EXTERNAL FIXATION LEG Left 07/27/2020   Procedure: Adjustment of external fixator left leg;  Surgeon: Myrene Galas, MD;  Location: [facility] OR;  Service: Orthopedics;  Laterality: Left;  . EXTERNAL FIXATION REMOVAL Left 08/12/2020   Procedure: REMOVAL EXTERNAL FIXATION LEG;  Surgeon: Myrene Galas, MD;  Location: [facility] OR;  Service: Orthopedics;  Laterality: Left;  . IRRIGATION AND DEBRIDEMENT KNEE Left 06/17/2014   Procedure: IRRIGATION AND DEBRIDEMENT KNEE;  Surgeon:  Budd Palmer, MD;  Location: [facility] ORS;  Service: Orthopedics;  Laterality: Left;  . KNEE BURSECTOMY Left 05/27/2015  . KNEE BURSECTOMY Left 05/27/2015   Procedure: KNEE BURSECTOMY, suture removal patellar tendon;  Surgeon: Myrene Galas, MD;  Location: [facility] OR;  Service: Orthopedics;  Laterality: Left;  . ORIF PATELLA Left 06/12/2014   Procedure: OPEN REDUCTION INTERNAL (ORIF) FIXATION PATELLA;  Surgeon: Budd Palmer, MD;  Location: [facility] OR;  Service: Orthopedics;  Laterality: Left;  . ORIF TIBIA PLATEAU Left 08/12/2020  . ORIF TIBIA PLATEAU Left 08/12/2020   Procedure: OPEN REDUCTION INTERNAL FIXATION (ORIF) BICONDYLAR TIBIAL PLATEAU;  Surgeon: Myrene Galas, MD;  Location: [facility] OR;  Service: Orthopedics;  Laterality: Left;  . REFRACTIVE SURGERY Bilateral   . TONSILLECTOMY      History reviewed. No pertinent family history.  Social History:  reports that he has never smoked. He has never used smokeless tobacco. He reports current alcohol use. He reports that he does not use drugs.  Allergies:  Allergies  Allergen Reactions  . Versed [Midazolam]     Memory problems    Medications: I have reviewed the patient's current medications. Current Meds  Medication Sig  . acetaminophen (TYLENOL) 500 MG tablet Take 1 tablet (500 mg total) by mouth every 12 (twelve) hours. (Patient taking differently: Take 1,000 mg by mouth every 6 (six) hours as needed for moderate pain or mild pain.)  . Cholecalciferol (VITAMIN D3) 250 MCG (10000 UT) capsule Take 10,000 Units by mouth daily.  Marland Kitchen HYDROcodone-acetaminophen (NORCO/VICODIN) 5-325  MG tablet Take 1 tablet by mouth daily as needed for pain.     No results found for this or any previous visit (from the past 48 hour(s)).  No results found.  Intake/Output    None      Review of Systems  Constitutional: Negative for chills and fever.  Respiratory: Negative for shortness of breath.   Cardiovascular: Negative for chest pain and palpitations.   Gastrointestinal: Negative for abdominal pain, nausea and vomiting.  Musculoskeletal: Positive for joint pain (+ L knee pain ).  Neurological: Negative for tingling and sensory change.  Psychiatric/Behavioral: Negative for substance abuse.   Height 5\' 11"  (1.803 m), weight 99.8 kg. Physical Exam Vitals reviewed.  Constitutional:      General: He is not in acute distress.    Appearance: Normal appearance. He is well-developed and well-groomed. He is not ill-appearing.  HENT:     Head: Normocephalic and atraumatic.     Mouth/Throat:     Mouth: Mucous membranes are moist.     Pharynx: Oropharynx is clear.  Eyes:     Extraocular Movements: Extraocular movements intact.  Cardiovascular:     Rate and Rhythm: Normal rate and regular rhythm.     Heart sounds: S1 normal and S2 normal.  Pulmonary:     Effort: Pulmonary effort is normal. No respiratory distress.     Breath sounds: Normal breath sounds.  Musculoskeletal:     Comments: Left Lower Extremity  + varus deformity L proximal tibia  Surgical wounds left knee healed Moderate swelling persists + TTP proximal medial tibia  Lacks full extension of L knee  Ext warm + DP pulse No DCT Compartments soft  Motor and sensory functions intact    Skin:    General: Skin is warm.     Capillary Refill: Capillary refill takes less than 2 seconds.  Neurological:     General: No focal deficit present.     Mental Status: He is alert and oriented to person, place, and time.  Psychiatric:        Attention and Perception: Attention normal.        Mood and Affect: Mood normal.        Speech: Speech normal.        Behavior: Behavior is cooperative.      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  OR for repair of nonuion   ICBG vs RIA  Risks and benefits reviewed with pt and he wishes to proceed  No motion restrictions post op    TDWB post op in brace     This is likely a staged procedure for future TKA   - Pain management:  Titrate accordingly   - Medical issues   History of L patella osteo which has since been treated and cleared   - DVT/PE prophylaxis:  DOAC post op x 4 weeks   Will likely re-ultrasound during hospitalization   - ID:   periop abx   Hold pre-op abx    Low suspicion for infection but given history of patellar osteo it is on the differential   - FEN/GI prophylaxis/Foley/Lines:  Advance diet post op   - Dispo:  OR to address L tibial plateau nonunion     78, PA-C [phone] (C) 12/13/2020, 4:53 PM  [facility] [address] [place] [phone] (F)    After  5pm and on the weekends please log on to Amion, go to orthopaedics and the look under the [facility] Call for the provider(s) on call. You can also call our office at [phone] and then follow the prompts to be connected to the call team.

## 2020-12-13 NOTE — Progress Notes (Signed)
Mr Brian Atkinson denies chest pain or shortness of breath. Patient  tested negative for Covid  and has been in quarantine since that time.  [name] asked iof his wife may spend the night while he is in the hospital, I said that the hospital is not allowing it at this time. [name] said that his wife was allowed when he was in the hospital in the fall of 2021; I instructed him oo ask the Director of the unit when he get a bed tomorrow.

## 2020-12-14 ENCOUNTER — Inpatient Hospital Stay (HOSPITAL_COMMUNITY): Payer: Medicare Other

## 2020-12-14 ENCOUNTER — Encounter (HOSPITAL_COMMUNITY): Payer: Self-pay | Admitting: Orthopedic Surgery

## 2020-12-14 ENCOUNTER — Inpatient Hospital Stay (HOSPITAL_COMMUNITY): Payer: Medicare Other | Admitting: Anesthesiology

## 2020-12-14 ENCOUNTER — Inpatient Hospital Stay (HOSPITAL_COMMUNITY)
Admission: RE | Admit: 2020-12-14 | Discharge: 2020-12-17 | DRG: 493 | Disposition: A | Discharge status: Home / Self Care | Payer: Medicare Other | Attending: Orthopedic Surgery | Admitting: Orthopedic Surgery

## 2020-12-14 ENCOUNTER — Encounter (HOSPITAL_COMMUNITY)
Admission: RE | Disposition: A | Discharge status: Home / Self Care | Payer: Self-pay | Source: Home / Self Care | Attending: Orthopedic Surgery

## 2020-12-14 DIAGNOSIS — Z9841 Cataract extraction status, right eye: Secondary | ICD-10-CM

## 2020-12-14 DIAGNOSIS — Z8614 Personal history of Methicillin resistant Staphylococcus aureus infection: Secondary | ICD-10-CM | POA: Diagnosis not present

## 2020-12-14 DIAGNOSIS — Z888 Allergy status to other drugs, medicaments and biological substances status: Secondary | ICD-10-CM

## 2020-12-14 DIAGNOSIS — S82142K Displaced bicondylar fracture of left tibia, subsequent encounter for closed fracture with nonunion: Principal | ICD-10-CM | POA: Diagnosis present

## 2020-12-14 DIAGNOSIS — Z9842 Cataract extraction status, left eye: Secondary | ICD-10-CM | POA: Diagnosis not present

## 2020-12-14 DIAGNOSIS — D62 Acute posthemorrhagic anemia: Secondary | ICD-10-CM | POA: Diagnosis not present

## 2020-12-14 DIAGNOSIS — X58XXXD Exposure to other specified factors, subsequent encounter: Secondary | ICD-10-CM | POA: Diagnosis present

## 2020-12-14 DIAGNOSIS — Z419 Encounter for procedure for purposes other than remedying health state, unspecified: Secondary | ICD-10-CM

## 2020-12-14 DIAGNOSIS — Z961 Presence of intraocular lens: Secondary | ICD-10-CM | POA: Diagnosis present

## 2020-12-14 DIAGNOSIS — Z01818 Encounter for other preprocedural examination: Secondary | ICD-10-CM

## 2020-12-14 DIAGNOSIS — T148XXA Other injury of unspecified body region, initial encounter: Secondary | ICD-10-CM

## 2020-12-14 HISTORY — PX: ORIF TIBIA PLATEAU: SHX2132

## 2020-12-14 LAB — COMPREHENSIVE METABOLIC PANEL
ALT: 17 U/L (ref 0–44)
AST: 20 U/L (ref 15–41)
Albumin: 3.5 g/dL (ref 3.5–5.0)
Alkaline Phosphatase: 77 U/L (ref 38–126)
Anion gap: 8 (ref 5–15)
BUN: 18 mg/dL (ref 8–23)
CO2: 23 mmol/L (ref 22–32)
Calcium: 8.6 mg/dL — ABNORMAL LOW (ref 8.9–10.3)
Chloride: 105 mmol/L (ref 98–111)
Creatinine, Ser: 0.86 mg/dL (ref 0.61–1.24)
GFR, Estimated: >60 mL/min (ref >60)
Glucose, Bld: 96 mg/dL (ref 70–99)
Potassium: 3.9 mmol/L (ref 3.5–5.1)
Sodium: 136 mmol/L (ref 135–145)
Total Bilirubin: 1.2 mg/dL (ref 0.3–1.2)
Total Protein: 6.6 g/dL (ref 6.5–8.1)

## 2020-12-14 LAB — CBC WITH DIFFERENTIAL/PLATELET
Abs Immature Granulocytes: 0.03 10*3/uL (ref 0.00–0.07)
Basophils Absolute: 0.1 10*3/uL (ref 0.0–0.1)
Basophils Relative: 1 %
Eosinophils Absolute: 0.2 10*3/uL (ref 0.0–0.5)
Eosinophils Relative: 3 %
HCT: 39.8 % (ref 39.0–52.0)
Hemoglobin: 12.5 g/dL — ABNORMAL LOW (ref 13.0–17.0)
Immature Granulocytes: 0 %
Lymphocytes Relative: 21 %
Lymphs Abs: 1.4 10*3/uL (ref 0.7–4.0)
MCH: 26.8 pg (ref 26.0–34.0)
MCHC: 31.4 g/dL (ref 30.0–36.0)
MCV: 85.4 fL (ref 80.0–100.0)
Monocytes Absolute: 0.8 10*3/uL (ref 0.1–1.0)
Monocytes Relative: 12 %
Neutro Abs: 4.3 10*3/uL (ref 1.7–7.7)
Neutrophils Relative %: 63 %
Platelets: 241 10*3/uL (ref 150–400)
RBC: 4.66 MIL/uL (ref 4.22–5.81)
RDW: 17.5 % — ABNORMAL HIGH (ref 11.5–15.5)
WBC: 6.7 10*3/uL (ref 4.0–10.5)
nRBC: 0 % (ref 0.0–0.2)

## 2020-12-14 LAB — SEDIMENTATION RATE: Sed Rate: 6 mm/hr (ref 0–16)

## 2020-12-14 LAB — CBC
HCT: 34.6 % — ABNORMAL LOW (ref 39.0–52.0)
Hemoglobin: 11 g/dL — ABNORMAL LOW (ref 13.0–17.0)
MCH: 27.1 pg (ref 26.0–34.0)
MCHC: 31.8 g/dL (ref 30.0–36.0)
MCV: 85.2 fL (ref 80.0–100.0)
Platelets: 214 10*3/uL (ref 150–400)
RBC: 4.06 MIL/uL — ABNORMAL LOW (ref 4.22–5.81)
RDW: 17.3 % — ABNORMAL HIGH (ref 11.5–15.5)
WBC: 11.6 10*3/uL — ABNORMAL HIGH (ref 4.0–10.5)
nRBC: 0 % (ref 0.0–0.2)

## 2020-12-14 LAB — CREATININE, SERUM
Creatinine, Ser: 0.91 mg/dL (ref 0.61–1.24)
GFR, Estimated: >60 mL/min (ref >60)

## 2020-12-14 LAB — C-REACTIVE PROTEIN: CRP: <0.5 mg/dL (ref <1.0)

## 2020-12-14 LAB — PROTIME-INR
INR: 1.1 (ref 0.8–1.2)
Prothrombin Time: 13.5 seconds (ref 11.4–15.2)

## 2020-12-14 LAB — APTT: aPTT: 30 seconds (ref 24–36)

## 2020-12-14 SURGERY — OPEN REDUCTION INTERNAL FIXATION (ORIF) TIBIAL PLATEAU
Anesthesia: General | Site: Leg Lower | Laterality: Left

## 2020-12-14 MED ORDER — PROPOFOL 10 MG/ML IV BOLUS
INTRAVENOUS | Status: DC | PRN
  Administered 2020-12-14: 200 mg via INTRAVENOUS

## 2020-12-14 MED ORDER — ROCURONIUM BROMIDE 10 MG/ML (PF) SYRINGE
PREFILLED_SYRINGE | INTRAVENOUS | Status: AC
  Filled 2020-12-14: qty 10

## 2020-12-14 MED ORDER — PHENYLEPHRINE 40 MCG/ML (10ML) SYRINGE FOR IV PUSH (FOR BLOOD PRESSURE SUPPORT)
PREFILLED_SYRINGE | INTRAVENOUS | Status: AC
  Filled 2020-12-14: qty 10

## 2020-12-14 MED ORDER — FENTANYL CITRATE (PF) 100 MCG/2ML IJ SOLN
INTRAMUSCULAR | Status: DC | PRN
  Administered 2020-12-14: 50 ug via INTRAVENOUS
  Administered 2020-12-14: 100 ug via INTRAVENOUS
  Administered 2020-12-14 (×5): 50 ug via INTRAVENOUS

## 2020-12-14 MED ORDER — ONDANSETRON HCL 4 MG PO TABS: 4.0000 mg | ORAL_TABLET | Freq: Four times a day (QID) | ORAL | Status: DC | PRN

## 2020-12-14 MED ORDER — PROPOFOL 10 MG/ML IV BOLUS
INTRAVENOUS | Status: AC
  Filled 2020-12-14: qty 20

## 2020-12-14 MED ORDER — ACETAMINOPHEN 325 MG PO TABS: 325.0000 mg | ORAL_TABLET | Freq: Four times a day (QID) | ORAL | Status: DC | PRN

## 2020-12-14 MED ORDER — ENOXAPARIN SODIUM 40 MG/0.4ML ~~LOC~~ SOLN
40.0000 mg | SUBCUTANEOUS | Status: DC
  Administered 2020-12-15 – 2020-12-17 (×3): 40 mg via SUBCUTANEOUS
  Filled 2020-12-14 (×3): qty 0.4

## 2020-12-14 MED ORDER — FENTANYL CITRATE (PF) 250 MCG/5ML IJ SOLN
INTRAMUSCULAR | Status: AC
  Filled 2020-12-14: qty 5

## 2020-12-14 MED ORDER — DEXAMETHASONE SODIUM PHOSPHATE 4 MG/ML IJ SOLN
INTRAMUSCULAR | Status: DC | PRN
  Administered 2020-12-14: 4 mg via INTRAVENOUS

## 2020-12-14 MED ORDER — DIPHENHYDRAMINE HCL 12.5 MG/5ML PO ELIX: 12.5000 mg | ORAL_SOLUTION | ORAL | Status: DC | PRN

## 2020-12-14 MED ORDER — KETOROLAC TROMETHAMINE 15 MG/ML IJ SOLN
7.5000 mg | Freq: Four times a day (QID) | INTRAMUSCULAR | Status: AC
  Administered 2020-12-14 – 2020-12-15 (×4): 7.5 mg via INTRAVENOUS
  Filled 2020-12-14 (×4): qty 1

## 2020-12-14 MED ORDER — FLEET ENEMA 7-19 GM/118ML RE ENEM: 1.0000 | ENEMA | Freq: Once | RECTAL | Status: DC | PRN

## 2020-12-14 MED ORDER — DEXTROSE 5 % IV SOLN
500.0000 mg | Freq: Three times a day (TID) | INTRAVENOUS | Status: DC
  Filled 2020-12-14: qty 5

## 2020-12-14 MED ORDER — POLYETHYLENE GLYCOL 3350 17 G PO PACK
17.0000 g | PACK | Freq: Every day | ORAL | Status: DC
Start: 2020-12-14 — End: 2020-12-17
  Filled 2020-12-14 (×3): qty 1

## 2020-12-14 MED ORDER — ACETAMINOPHEN 500 MG PO TABS
1000.0000 mg | ORAL_TABLET | Freq: Three times a day (TID) | ORAL | Status: DC
  Administered 2020-12-14 – 2020-12-17 (×9): 1000 mg via ORAL
  Filled 2020-12-14 (×9): qty 2

## 2020-12-14 MED ORDER — ORAL CARE MOUTH RINSE: 15.0000 mL | Freq: Once | OROMUCOSAL | Status: AC

## 2020-12-14 MED ORDER — HYDROMORPHONE HCL 1 MG/ML IJ SOLN
0.5000 mg | INTRAMUSCULAR | Status: DC | PRN
  Administered 2020-12-15 – 2020-12-16 (×2): 1 mg via INTRAVENOUS
  Filled 2020-12-14 (×2): qty 1

## 2020-12-14 MED ORDER — HYDROMORPHONE HCL 1 MG/ML IJ SOLN
0.2500 mg | INTRAMUSCULAR | Status: DC | PRN
  Administered 2020-12-14: 0.5 mg via INTRAVENOUS

## 2020-12-14 MED ORDER — LIDOCAINE HCL (CARDIAC) PF 100 MG/5ML IV SOSY
PREFILLED_SYRINGE | INTRAVENOUS | Status: DC | PRN
  Administered 2020-12-14: 100 mg via INTRAVENOUS

## 2020-12-14 MED ORDER — BISACODYL 5 MG PO TBEC: 5.0000 mg | DELAYED_RELEASE_TABLET | Freq: Every day | ORAL | Status: DC | PRN

## 2020-12-14 MED ORDER — CEFAZOLIN SODIUM-DEXTROSE 2-4 GM/100ML-% IV SOLN
2.0000 g | INTRAVENOUS | Status: AC
  Administered 2020-12-14: 2 g via INTRAVENOUS
  Filled 2020-12-14: qty 100

## 2020-12-14 MED ORDER — OXYCODONE HCL 5 MG/5ML PO SOLN: 5.0000 mg | Freq: Once | ORAL | Status: AC | PRN

## 2020-12-14 MED ORDER — ACETAMINOPHEN 500 MG PO TABS
1000.0000 mg | ORAL_TABLET | Freq: Once | ORAL | Status: AC
  Administered 2020-12-14: 1000 mg via ORAL

## 2020-12-14 MED ORDER — PROMETHAZINE HCL 25 MG/ML IJ SOLN: 6.2500 mg | INTRAMUSCULAR | Status: DC | PRN

## 2020-12-14 MED ORDER — FENTANYL CITRATE (PF) 100 MCG/2ML IJ SOLN
INTRAMUSCULAR | Status: AC
  Filled 2020-12-14: qty 2

## 2020-12-14 MED ORDER — OXYCODONE HCL 5 MG PO TABS
10.0000 mg | ORAL_TABLET | ORAL | Status: DC | PRN
  Administered 2020-12-15 (×4): 15 mg via ORAL
  Administered 2020-12-16: 5 mg via ORAL
  Administered 2020-12-16: 15 mg via ORAL
  Filled 2020-12-14 (×6): qty 3

## 2020-12-14 MED ORDER — ALBUMIN HUMAN 5 % IV SOLN: INTRAVENOUS | Status: DC | PRN

## 2020-12-14 MED ORDER — OXYCODONE HCL 5 MG PO TABS
5.0000 mg | ORAL_TABLET | Freq: Once | ORAL | Status: AC | PRN
  Administered 2020-12-14: 5 mg via ORAL

## 2020-12-14 MED ORDER — METOCLOPRAMIDE HCL 5 MG PO TABS: 5.0000 mg | ORAL_TABLET | Freq: Three times a day (TID) | ORAL | Status: DC | PRN

## 2020-12-14 MED ORDER — ACETAMINOPHEN 500 MG PO TABS
1000.0000 mg | ORAL_TABLET | Freq: Once | ORAL | Status: DC
  Filled 2020-12-14: qty 2

## 2020-12-14 MED ORDER — ONDANSETRON HCL 4 MG/2ML IJ SOLN: 4.0000 mg | Freq: Four times a day (QID) | INTRAMUSCULAR | Status: DC | PRN

## 2020-12-14 MED ORDER — GABAPENTIN 300 MG PO CAPS
300.0000 mg | ORAL_CAPSULE | Freq: Once | ORAL | Status: AC
  Administered 2020-12-14: 300 mg via ORAL
  Filled 2020-12-14: qty 1

## 2020-12-14 MED ORDER — HYDROMORPHONE HCL 1 MG/ML IJ SOLN
INTRAMUSCULAR | Status: AC
  Filled 2020-12-14: qty 1

## 2020-12-14 MED ORDER — 0.9 % SODIUM CHLORIDE (POUR BTL) OPTIME
TOPICAL | Status: DC | PRN
  Administered 2020-12-14: 1000 mL

## 2020-12-14 MED ORDER — OXYCODONE HCL 5 MG PO TABS
5.0000 mg | ORAL_TABLET | ORAL | Status: DC | PRN
  Administered 2020-12-15: 5 mg via ORAL
  Administered 2020-12-16 (×2): 10 mg via ORAL
  Filled 2020-12-14: qty 2
  Filled 2020-12-14: qty 1
  Filled 2020-12-14: qty 2

## 2020-12-14 MED ORDER — PHENYLEPHRINE HCL-NACL 10-0.9 MG/250ML-% IV SOLN
INTRAVENOUS | Status: DC | PRN
  Administered 2020-12-14: 25 ug/min via INTRAVENOUS

## 2020-12-14 MED ORDER — CEFAZOLIN SODIUM-DEXTROSE 2-4 GM/100ML-% IV SOLN
2.0000 g | Freq: Four times a day (QID) | INTRAVENOUS | Status: AC
  Administered 2020-12-14 – 2020-12-15 (×3): 2 g via INTRAVENOUS
  Filled 2020-12-14 (×4): qty 100

## 2020-12-14 MED ORDER — FENTANYL CITRATE (PF) 100 MCG/2ML IJ SOLN
25.0000 ug | INTRAMUSCULAR | Status: DC | PRN
  Administered 2020-12-14 (×3): 50 ug via INTRAVENOUS

## 2020-12-14 MED ORDER — DOCUSATE SODIUM 100 MG PO CAPS
100.0000 mg | ORAL_CAPSULE | Freq: Two times a day (BID) | ORAL | Status: DC
  Administered 2020-12-15 – 2020-12-17 (×3): 100 mg via ORAL
  Filled 2020-12-14 (×5): qty 1

## 2020-12-14 MED ORDER — METOCLOPRAMIDE HCL 5 MG/ML IJ SOLN: 5.0000 mg | Freq: Three times a day (TID) | INTRAMUSCULAR | Status: DC | PRN

## 2020-12-14 MED ORDER — METHOCARBAMOL 500 MG PO TABS
1000.0000 mg | ORAL_TABLET | Freq: Three times a day (TID) | ORAL | Status: DC
  Administered 2020-12-14 – 2020-12-17 (×8): 1000 mg via ORAL
  Filled 2020-12-14 (×8): qty 2

## 2020-12-14 MED ORDER — LACTATED RINGERS IV SOLN: INTRAVENOUS | Status: DC | PRN

## 2020-12-14 MED ORDER — PROPOFOL 1000 MG/100ML IV EMUL
INTRAVENOUS | Status: AC
  Filled 2020-12-14: qty 100

## 2020-12-14 MED ORDER — CHLORHEXIDINE GLUCONATE 0.12 % MT SOLN
15.0000 mL | Freq: Once | OROMUCOSAL | Status: AC
  Administered 2020-12-14: 15 mL via OROMUCOSAL
  Filled 2020-12-14: qty 15

## 2020-12-14 MED ORDER — ROCURONIUM BROMIDE 100 MG/10ML IV SOLN
INTRAVENOUS | Status: DC | PRN
  Administered 2020-12-14 (×2): 20 mg via INTRAVENOUS
  Administered 2020-12-14: 70 mg via INTRAVENOUS
  Administered 2020-12-14: 20 mg via INTRAVENOUS
  Administered 2020-12-14: 30 mg via INTRAVENOUS

## 2020-12-14 MED ORDER — POTASSIUM CHLORIDE IN NACL 20-0.9 MEQ/L-% IV SOLN
INTRAVENOUS | Status: DC
  Filled 2020-12-14: qty 1000

## 2020-12-14 MED ORDER — OXYCODONE HCL 5 MG PO TABS
ORAL_TABLET | ORAL | Status: AC
  Filled 2020-12-14: qty 1

## 2020-12-14 MED ORDER — LACTATED RINGERS IV SOLN: INTRAVENOUS | Status: DC

## 2020-12-14 SURGICAL SUPPLY — 85 items
BANDAGE ESMARK 6X9 LF (GAUZE/BANDAGES/DRESSINGS) ×1 IMPLANT
BIT DRILL CALIBR QC 2.5X250 (BIT) ×1 IMPLANT
BIT DRILL CALIBR QC 2.8X250 (BIT) ×1 IMPLANT
BIT DRILL QC SFS 2.5X170 (BIT) ×1 IMPLANT
BLADE CLIPPER SURG (BLADE) ×1 IMPLANT
BLADE SURG 10 STRL SS (BLADE) ×2 IMPLANT
BLADE SURG 15 STRL LF DISP TIS (BLADE) ×1 IMPLANT
BLADE SURG 15 STRL SS (BLADE) ×2
BNDG CMPR 9X6 STRL LF SNTH (GAUZE/BANDAGES/DRESSINGS) ×1
BNDG CMPR MED 10X6 ELC LF (GAUZE/BANDAGES/DRESSINGS) ×1
BNDG COHESIVE 4X5 TAN STRL (GAUZE/BANDAGES/DRESSINGS) ×2 IMPLANT
BNDG ELASTIC 4X5.8 VLCR STR LF (GAUZE/BANDAGES/DRESSINGS) ×2 IMPLANT
BNDG ELASTIC 6X10 VLCR STRL LF (GAUZE/BANDAGES/DRESSINGS) ×1 IMPLANT
BNDG ELASTIC 6X5.8 VLCR STR LF (GAUZE/BANDAGES/DRESSINGS) ×2 IMPLANT
BNDG ESMARK 6X9 LF (GAUZE/BANDAGES/DRESSINGS) ×2
BNDG GAUZE ELAST 4 BULKY (GAUZE/BANDAGES/DRESSINGS) ×2 IMPLANT
BRUSH SCRUB EZ PLAIN DRY (MISCELLANEOUS) ×4 IMPLANT
CANISTER SUCT 3000ML PPV (MISCELLANEOUS) ×2 IMPLANT
COVER SURGICAL LIGHT HANDLE (MISCELLANEOUS) ×2 IMPLANT
COVER WAND RF STERILE (DRAPES) ×2 IMPLANT
DRAPE C-ARM 42X72 X-RAY (DRAPES) ×2 IMPLANT
DRAPE C-ARMOR (DRAPES) ×2 IMPLANT
DRAPE DERMATAC (DRAPES) ×2 IMPLANT
DRAPE HALF SHEET 40X57 (DRAPES) ×2 IMPLANT
DRAPE INCISE IOBAN 66X45 STRL (DRAPES) ×2 IMPLANT
DRAPE ORTHO SPLIT ENLRG (DRAPES) ×2 IMPLANT
DRAPE U-SHAPE 47X51 STRL (DRAPES) ×2 IMPLANT
DRESSING MEPILEX FLEX 4X4 (GAUZE/BANDAGES/DRESSINGS) IMPLANT
DRESSING PREVENA PLUS CUSTOM (GAUZE/BANDAGES/DRESSINGS) IMPLANT
DRSG ADAPTIC 3X8 NADH LF (GAUZE/BANDAGES/DRESSINGS) ×2 IMPLANT
DRSG MEPILEX FLEX 4X4 (GAUZE/BANDAGES/DRESSINGS) ×2
DRSG PAD ABDOMINAL 8X10 ST (GAUZE/BANDAGES/DRESSINGS) ×4 IMPLANT
DRSG PREVENA PLUS CUSTOM (GAUZE/BANDAGES/DRESSINGS) ×2
ELECT REM PT RETURN 9FT ADLT (ELECTROSURGICAL) ×2
ELECTRODE REM PT RTRN 9FT ADLT (ELECTROSURGICAL) ×1 IMPLANT
GAUZE SPONGE 4X4 12PLY STRL (GAUZE/BANDAGES/DRESSINGS) ×2 IMPLANT
GLOVE BIO SURGEON STRL SZ7.5 (GLOVE) ×2 IMPLANT
GLOVE BIO SURGEON STRL SZ8 (GLOVE) ×2 IMPLANT
GLOVE BIOGEL PI IND STRL 7.5 (GLOVE) ×1 IMPLANT
GLOVE BIOGEL PI INDICATOR 7.5 (GLOVE) ×1
GLOVE SRG 8 PF TXTR STRL LF DI (GLOVE) ×1 IMPLANT
GLOVE SURG UNDER POLY LF SZ8 (GLOVE) ×2
GOWN STRL REUS W/ TWL LRG LVL3 (GOWN DISPOSABLE) ×2 IMPLANT
GOWN STRL REUS W/ TWL XL LVL3 (GOWN DISPOSABLE) ×1 IMPLANT
GOWN STRL REUS W/TWL LRG LVL3 (GOWN DISPOSABLE) ×4
GOWN STRL REUS W/TWL XL LVL3 (GOWN DISPOSABLE) ×2
IMMOBILIZER KNEE 22 UNIV (SOFTGOODS) ×2 IMPLANT
KIT BASIN OR (CUSTOM PROCEDURE TRAY) ×2 IMPLANT
KIT BONE HARVEST RIA 2 (ORTHOPEDIC DISPOSABLE SUPPLIES) ×2 IMPLANT
KIT TURNOVER KIT B (KITS) ×2 IMPLANT
NS IRRIG 1000ML POUR BTL (IV SOLUTION) ×2 IMPLANT
PACK ORTHO EXTREMITY (CUSTOM PROCEDURE TRAY) ×2 IMPLANT
PAD ARMBOARD 7.5X6 YLW CONV (MISCELLANEOUS) ×4 IMPLANT
PAD CAST 4YDX4 CTTN HI CHSV (CAST SUPPLIES) ×1 IMPLANT
PADDING CAST COTTON 4X4 STRL (CAST SUPPLIES) ×2
PADDING CAST COTTON 6X4 STRL (CAST SUPPLIES) ×2 IMPLANT
PLATE PROX TIBIA VA-LCP (Plate) ×1 IMPLANT
REAMER HEAD RIA 2 13.5 (INSTRUMENTS) ×1 IMPLANT
REAMER ROD DEEP FLUTE 2.5X950 (INSTRUMENTS) ×2 IMPLANT
SCREW CORTEX 3.5 32MM (Screw) ×1 IMPLANT
SCREW CORTEX 3.5 36MM (Screw) ×1 IMPLANT
SCREW CORTEX SELFTAP 3.5X95 (Screw) ×1 IMPLANT
SCREW LCK SLF-TPNG STRDRV 12 (Screw) ×1 IMPLANT
SCREW LOCK CORT ST 3.5X32 (Screw) ×1 IMPLANT
SCREW LOCK CORT ST 3.5X36 (Screw) IMPLANT
SCREW LOCKING 3.5X90MM VA (Screw) ×2 IMPLANT
SCREW LOCKING VA 3.5X30MM (Screw) ×1 IMPLANT
SCREW LOCKING VA 3.5X32MM (Screw) ×4 IMPLANT
SCREW LOCKING VA 3.5X85MM (Screw) ×2 IMPLANT
SCREW LOCKING VA 3.5X95MM (Screw) ×6 IMPLANT
SCREW VA-LCP THRD ST 3.5X10 (Screw) ×1 IMPLANT
SPONGE LAP 18X18 RF (DISPOSABLE) ×2 IMPLANT
STAPLER VISISTAT 35W (STAPLE) ×2 IMPLANT
STOCKINETTE IMPERVIOUS LG (DRAPES) ×2 IMPLANT
SUCTION FRAZIER HANDLE 10FR (MISCELLANEOUS) ×2
SUCTION TUBE FRAZIER 10FR DISP (MISCELLANEOUS) ×1 IMPLANT
SUT ETHILON 2 0 PSLX (SUTURE) ×2 IMPLANT
SUT PDS AB 0 CT 36 (SUTURE) ×2 IMPLANT
SUT PDS AB 2-0 CT1 27 (SUTURE) ×3 IMPLANT
TOWEL GREEN STERILE (TOWEL DISPOSABLE) ×4 IMPLANT
TOWEL GREEN STERILE FF (TOWEL DISPOSABLE) ×2 IMPLANT
TRAY FOLEY MTR SLVR 16FR STAT (SET/KITS/TRAYS/PACK) ×1 IMPLANT
TUBE CONNECTING 12X1/4 (SUCTIONS) ×2 IMPLANT
WATER STERILE IRR 1000ML POUR (IV SOLUTION) ×4 IMPLANT
YANKAUER SUCT BULB TIP NO VENT (SUCTIONS) ×2 IMPLANT

## 2020-12-14 NOTE — Anesthesia Postprocedure Evaluation (Signed)
Anesthesia Post Note  Patient: Hooper Petteway  Procedure(s) Performed: TIBIA REPAIR USING ILLIAC CREST BONE GRAFT VERSUS REAMED INTRAMEDULLARY ASPIRATE (Left Leg Lower)     Patient location during evaluation: PACU Anesthesia Type: General Level of consciousness: awake Pain management: pain level controlled Vital Signs Assessment: post-procedure vital signs reviewed and stable Respiratory status: spontaneous breathing, nonlabored ventilation, respiratory function stable and patient connected to nasal cannula oxygen Cardiovascular status: blood pressure returned to baseline and stable Postop Assessment: no apparent nausea or vomiting Anesthetic complications: no   No complications documented.  Last Vitals:  Vitals:   12/14/20 2020 12/14/20 2044  BP:  (!) 137/91  Pulse: 82 87  Resp: (!) 7 16  Temp: 36.5 C 36.5 C  SpO2: 96%     Last Pain:  Vitals:   12/14/20 2044  TempSrc: Oral  PainSc: 6                  Bailie Christenbury P Omni Dunsworth

## 2020-12-14 NOTE — Anesthesia Preprocedure Evaluation (Addendum)
Anesthesia Evaluation  Patient identified by MRN, date of birth, ID band Patient awake    Reviewed: Allergy & Precautions, NPO status , Patient's Chart, lab work & pertinent test results  History of Anesthesia Complications Negative for: history of anesthetic complications  Airway Mallampati: II  TM Distance: >3 FB Neck ROM: Full    Dental no notable dental hx.    Pulmonary neg pulmonary ROS,    Pulmonary exam normal        Cardiovascular negative cardio ROS Normal cardiovascular exam     Neuro/Psych negative neurological ROS  negative psych ROS   GI/Hepatic negative GI ROS, Neg liver ROS,   Endo/Other  negative endocrine ROS  Renal/GU negative Renal ROS  negative genitourinary   Musculoskeletal Nonunion left tibia   Abdominal   Peds  Hematology  (+) anemia , Hgb 12.5   Anesthesia Other Findings Day of surgery medications reviewed with patient.  Reproductive/Obstetrics negative OB ROS                            Anesthesia Physical Anesthesia Plan  ASA: II  Anesthesia Plan: General   Post-op Pain Management:    Induction: Intravenous  PONV Risk Score and Plan: Treatment may vary due to age or medical condition, Ondansetron and Dexamethasone  Airway Management Planned: Oral ETT  Additional Equipment: None  Intra-op Plan:   Post-operative Plan: Extubation in OR  Informed Consent: I have reviewed the patients History and Physical, chart, labs and discussed the procedure including the risks, benefits and alternatives for the proposed anesthesia with the patient or authorized representative who has indicated his/her understanding and acceptance.     Dental advisory given  Plan Discussed with: CRNA  Anesthesia Plan Comments:      Anesthesia Quick Evaluation

## 2020-12-14 NOTE — Anesthesia Procedure Notes (Signed)
Procedure Name: Intubation Date/Time: 12/14/2020 2:51 PM Performed by: Oletta Lamas, CRNA Pre-anesthesia Checklist: Patient identified, Emergency Drugs available, Suction available and Patient being monitored Patient Re-evaluated:Patient Re-evaluated prior to induction Oxygen Delivery Method: Circle System Utilized Preoxygenation: Pre-oxygenation with 100% oxygen Induction Type: IV induction Ventilation: Mask ventilation without difficulty Laryngoscope Size: Mac and 4 Grade View: Grade II Tube type: Oral Tube size: 7.0 mm Number of attempts: 1 Airway Equipment and Method: Stylet Placement Confirmation: ETT inserted through vocal cords under direct vision,  positive ETCO2 and breath sounds checked- equal and bilateral Secured at: 23 cm Tube secured with: Tape Dental Injury: Teeth and Oropharynx as per pre-operative assessment

## 2020-12-14 NOTE — Progress Notes (Signed)
Pt given another update on the projected time for surgery. Will continue to monitor.

## 2020-12-14 NOTE — Progress Notes (Signed)
Patient informed of delay, call also placed to patient's wife.

## 2020-12-14 NOTE — Progress Notes (Signed)
Pt given an update on the surgery plan. Pt in no apparent distress or pain. Will continue to monitor.

## 2020-12-14 NOTE — Transfer of Care (Signed)
Immediate Anesthesia Transfer of Care Note  Patient: Brian Atkinson  Procedure(s) Performed: TIBIA REPAIR USING ILLIAC CREST BONE GRAFT VERSUS REAMED INTRAMEDULLARY ASPIRATE (Left Leg Lower)  Patient Location: PACU  Anesthesia Type:General  Level of Consciousness: drowsy  Airway & Oxygen Therapy: Patient Spontanous Breathing and Patient connected to nasal cannula oxygen  Post-op Assessment: Report given to RN and Post -op Vital signs reviewed and stable  Post vital signs: Reviewed and stable  Last Vitals:  Vitals Value Taken Time  BP 141/92 12/14/20 2010  Temp 36.3 C 12/14/20 1925  Pulse 82 12/14/20 2015  Resp 9 12/14/20 2015  SpO2 94 % 12/14/20 2015  Vitals shown include unvalidated device data.  Last Pain:  Vitals:   12/14/20 2000  TempSrc:   PainSc: 10-Worst pain ever      Patients Stated Pain Goal: 3 (12/14/20 1925)  Complications: No complications documented.

## 2020-12-14 NOTE — Brief Op Note (Signed)
12/14/2020  7:09 PM  PATIENT:  Brian Atkinson  70 y.o. male  PRE-OPERATIVE DIAGNOSIS:  NONUNION LEFT TIBIA  POST-OPERATIVE DIAGNOSIS:   1. NONUNION LEFT TIBIA 2. MALUNION LEFT TIBIA 3. LOOSE HARDWARE  PROCEDURE:  Procedure(s): 1. OSTEOTOMY LEFT TIBIA MALUNION 2. REPAIR OF LEFT TIBIA NONUNION WITH REAMED INTRAMEDULLARY ASPIRATE (Left) 3. REMOVAL OF DEEP IMPLANT  SURGEON:  Surgeon(s) and Role:    Myrene Galas, MD - Primary  PHYSICIAN ASSISTANT: 1. Montez Morita, PA-C; 2. PA Student  ANESTHESIA:   general  I/O:  EBL 300 cc  SPECIMEN:  Source of Specimen:  1. nonunion site; 2. peri-implant tissue  TOURNIQUET:  * No tourniquets in log *  COMPLICATIONS: NONE  DICTATION: tba  DISPOSITION: TO PACU  CONDITION: STABLE  DELAY START OF DVT PROPHYLAXIS BECAUSE OF BLEEDING RISK: NO

## 2020-12-15 ENCOUNTER — Encounter (HOSPITAL_COMMUNITY): Payer: Self-pay | Admitting: Orthopedic Surgery

## 2020-12-15 NOTE — Progress Notes (Signed)
Orthopedic Tech Progress Note Patient Details:  Brian Atkinson April 16, 1951 [id number] RN called for zero knee bone foam. Pt was up in chair. Left bone foam at bedside and educated pt on proper use.  Ortho Devices Type of Ortho Device: Bone foam zero knee Ortho Device/Splint Location: LLE Ortho Device/Splint Interventions: Ordered   Post Interventions Patient Tolerated: Other (comment) (Left at bedside) Instructions Provided: Care of device   Kerry Fort 12/15/2020, 12:36 PM

## 2020-12-15 NOTE — Plan of Care (Signed)

## 2020-12-15 NOTE — Progress Notes (Signed)
Orthopaedic Trauma Service Progress Note  Patient ID: Brian Atkinson MRN: [id number] DOB/AGE: [birth year] 69 y.o.  Subjective:  Doing ok this am  Leg feels better than pre-op   No other complaints   Intra-op cultures: NGTD   ROS As above  Objective:   VITALS:   Vitals:   12/14/20 2044 12/15/20 0200 12/15/20 0508 12/15/20 0900  BP: (!) 137/91 (!) 148/92 132/80 112/63  Pulse: 87 100 86 97  Resp: 16 18 17 16   Temp: 97.7 F (36.5 C) 98.2 F (36.8 C) 97.6 F (36.4 C) 97.9 F (36.6 C)  TempSrc: Oral Oral Oral Oral  SpO2: 96% 96% 96% 97%  Weight:      Height:        Estimated body mass index is 32.08 kg/m as calculated from the following:   Height as of this encounter: 5\' 11"  (1.803 m).   Weight as of this encounter: 104.3 kg.   Intake/Output      03/08 0701 03/09 0700 03/09 0701 03/10 0700   I.V. (mL/kg) 2920 (28)    IV Piggyback 250    Total Intake(mL/kg) 3170 (30.4)    Urine (mL/kg/hr) 2645 (1.1)    Drains 50    Blood 350    Total Output 3045    Net +125         Urine Occurrence 1 x      LABS  Results for orders placed or performed during the hospital encounter of 12/14/20 (from the past 24 hour(s))  Aerobic/Anaerobic Culture w Gram Stain (surgical/deep wound)     Status: None (Preliminary result)   Collection Time: 12/14/20  4:11 PM   Specimen: PATH Bone biopsy; Tissue  Result Value Ref Range   Specimen Description TISSUE    Special Requests NON UNION LEFT TIBIA    Gram Stain      RARE WBC PRESENT, PREDOMINANTLY PMN NO ORGANISMS SEEN    Culture      NO GROWTH < 12 HOURS Performed at [facility], [address], [place], [address] [place]    Report Status PENDING   Aerobic/Anaerobic Culture w Gram Stain (surgical/deep wound)     Status: None (Preliminary result)   Collection Time: 12/14/20  4:38 PM   Specimen: PATH Bone biopsy; Tissue  Result Value Ref  Range   Specimen Description BIOPSY BONE    Special Requests FIBROUS PERI IMPLANT TISSUE LEFT TIBIA    Gram Stain NO WBC SEEN NO ORGANISMS SEEN     Culture      NO GROWTH < 12 HOURS Performed at [facility], [address], [place], [address] [place]    Report Status PENDING   CBC     Status: Abnormal   Collection Time: 12/14/20  9:41 PM  Result Value Ref Range   WBC 11.6 (H) 4.0 - 10.5 K/uL   RBC 4.06 (L) 4.22 - 5.81 MIL/uL   Hemoglobin 11.0 (L) 13.0 - 17.0 g/dL   HCT 89381 (L) 02/13/21 - 01.7 %   MCV 85.2 80.0 - 100.0 fL   MCH 27.1 26.0 - 34.0 pg   MCHC 31.8 30.0 - 36.0 g/dL   RDW 51.0 (H) 25.8 - 52.7 %   Platelets 214 150 - 400 K/uL   nRBC 0.0 0.0 - 0.2 %  Creatinine,  serum     Status: None   Collection Time: 12/14/20  9:41 PM  Result Value Ref Range   Creatinine, Ser 0.91 0.61 - 1.24 mg/dL   GFR, Estimated >62 >13 mL/min     PHYSICAL EXAM:   Gen: resting comfortably in bed, NAD, appears well Lungs: unlabored, clear  Cardiac: RRR Abd: + BS, NTND Ext:       Left lower extremity  Dressings are clean dry and intact  Prevena is functioning well.  Good seal  Swelling is stable  DPN, SPN, TN sensory function intact  EHL, FHL, lesser toe motor function intact.  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 12/14/20 0865 12/14/20 1511    .  POD/HD#:  68  70 year old male with left tibial plateau nonunion  -Left tibial plateau nonunion s/p repair of nonunion including use of autograft (reamed intramedullary aspirate)  Nonweightbearing left leg for 6 weeks  Motion of left knee as tolerated but no active knee extension against resistance  Continue with aggressive ice and elevation  Patient will resume a hinged knee brace use when she returns home  PT and OT evaluations   Okay to start mobilizing    Dressing change prior to discharge, which I anticipate being either tomorrow or Friday   Do not let knee rest in flexion.  Place pillows under ankle or use blue bone foam roll  - Pain management:  Multimodal.  Titrate accordingly - ABL anemia/Hemodynamics  Stable  - Medical issues   No chronic medical issues  - DVT/PE prophylaxis:  Lovenox while inpatient   Aspirin 325 twice daily at discharge  - ID:   Perioperative antibiotics  Follow-up on intraoperative cultures  Preop sed rate and CRP are normal  - Metabolic Bone Disease:  Vitamin D levels are great during his last admission  - Activity:  As above  - FEN/GI prophylaxis/Foley/Lines:  Regular diet  Normal saline lock once taking p.o.'s well  DC Foley  - Impediments to fracture healing:  Established nonunion  - Dispo:  Therapy eval's  Possible home tomorrow but more likely Friday   [name], PA-C [phone] (C) 12/15/2020, 10:21 AM  [facility] [address] [place] [address] [phone] Collier Bullock (F)    After 5pm and on the weekends please log on to Amion, go to orthopaedics and the look under the [facility] Call for the provider(s) on call. You can also call our office at [phone] and then follow the prompts to be connected to the call team.

## 2020-12-15 NOTE — Progress Notes (Signed)
Pt received from PACU via bed. Pt is alert and oriented x4. 2 RN skin assessment completed with charge nurse Alona Bene, RN. Pt doesn't complain of pain at this time. Surgical dressing is intact. Wound vac is set to continuous at 125. VSS at this time. Will continue to monitor.

## 2020-12-15 NOTE — Evaluation (Signed)
Occupational Therapy Evaluation Patient Details Name: Brian Atkinson MRN: [id number] DOB: January 07, 1951 Today's Date: 12/15/2020    History of Present Illness Brian Atkinson is an 70 y.o. male well known to OTS for numerous injuries. pts most recent injury was a L tibial plateau fractures sustained 07/27/2020. Treated in staged fashion with ex fix followed by ORIF 08/12/2020.  Pt has unfortunately developed a nonunion. 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 PTA, pt lived at home with his wife and daughter and was Ind with ADLs/selfcare and used RW for mobility. Pt currently requires min A/min guard A for LB ADLs and toileting; min guard A for mobility with RW. Pt would benefit from acute OT services to address impairments to maximize level of function and safety    Follow Up Recommendations  No OT follow up;Supervision - Intermittent    Equipment Recommendations  Tub/shower seat;Other (comment) Lexicographer)    Recommendations for Other Services       Precautions / Restrictions Precautions Precautions: Fall Restrictions Weight Bearing Restrictions: Yes LLE Weight Bearing: Non weight bearing Other Position/Activity Restrictions: Per PA note: Motion of left knee as tolerated but no active knee extension against resistance. Continue with aggressive ice and elevation. Patient will resume a hinged knee brace use when he returns home.  Do not let knee rest in flexion.  Place pillows under ankle or use blue bone foam roll      Mobility Bed Mobility Overal bed mobility: Needs Assistance Bed Mobility: Supine to Sit     Supine to sit: Supervision          Transfers Overall transfer level: Needs assistance Equipment used: Rolling walker (2 wheeled) Transfers: Sit to/from [employer] Sit to Stand: Min guard Stand pivot transfers: Min guard            Balance Overall balance assessment: Needs assistance Sitting-balance support: Feet supported;No upper extremity supported Sitting balance-Leahy Scale: Good     Standing balance support: Bilateral upper extremity supported;During functional activity Standing balance-Leahy Scale: Poor                             ADL either performed or assessed with clinical judgement   ADL Overall ADL's : Needs assistance/impaired Eating/Feeding: Independent;Sitting   Grooming: Wash/dry hands;Wash/dry face;Standing;Min guard   Upper Body Bathing: Set up;Independent;Sitting   Lower Body Bathing: Minimal assistance;Min guard;Sitting/lateral leans;Sit to/from stand   Upper Body Dressing : Set up;Independent;Sitting   Lower Body Dressing: Minimal assistance;Min guard;Sitting/lateral leans;Sit to/from stand   Toilet Transfer: Min guard;Ambulation;RW   Toileting- Architect and Hygiene: Min guard;Sit to/from stand       Functional mobility during ADLs: Min guard;Rolling walker General ADL Comments: pt educated on compensatory LB ADL techniques (L LE NWB)     Vision Baseline Vision/History: Wears glasses Patient Visual Report: No change from baseline       Perception     Praxis      Pertinent Vitals/Pain Pain Assessment: 0-10 Pain Score: 4  Pain Location: L LE Pain Descriptors / Indicators: Aching;Discomfort Pain Intervention(s): Monitored during session;Repositioned;Premedicated before session     Hand Dominance Right   Extremity/Trunk Assessment Upper Extremity Assessment Upper Extremity Assessment: Overall WFL for tasks assessed   Lower Extremity Assessment Lower  Extremity Assessment: Defer to PT evaluation   Cervical / Trunk Assessment Cervical / Trunk Assessment: Normal   Communication Communication Communication: No difficulties   Cognition  Arousal/Alertness: Awake/alert Behavior During Therapy: WFL for tasks assessed/performed Overall Cognitive Status: Within Functional Limits for tasks assessed                                     General Comments       Exercises     Shoulder Instructions      Home Living Family/patient expects to be discharged to:: Private residence Living Arrangements: Children;Spouse/significant other Available Help at Discharge: Family;Available 24 hours/day Type of Home: House Home Access: Level entry     Home Layout: Able to live on main level with bedroom/bathroom;Multi-level;Full bath on main level   Alternate Level Stairs-Rails: Left;Right;Can reach both Bathroom Shower/Tub: Producer, television/film/video: Standard     Home Equipment: Environmental consultant - 2 wheels;Cane - single point;Crutches;Bedside commode;Wheelchair - manual          Prior Functioning/Environment Level of Independence: Independent with assistive device(s)  Gait / Transfers Assistance Needed: uses RW ADL's / Homemaking Assistance Needed: was Ind with ADLs/selfcare            OT Problem List: Impaired balance (sitting and/or standing);Pain;Decreased activity tolerance;Decreased knowledge of use of DME or AE      OT Treatment/Interventions: Self-care/ADL training;DME and/or AE instruction;Therapeutic activities;Balance training;Patient/family education    OT Goals(Current goals can be found in the care plan section) Acute Rehab OT Goals Patient Stated Goal: go home OT Goal Formulation: With patient Time For Goal Achievement: 12/29/20 Potential to Achieve Goals: Good ADL Goals Pt Will Perform Grooming: with supervision;with set-up;with modified independence;standing Pt Will Perform Lower Body Bathing: with min guard assist;with supervision;with modified independence;sitting/lateral leans;sit to/from stand;with adaptive equipment Pt Will Perform Lower Body Dressing: with min guard assist;with  supervision;with modified independence;with adaptive equipment;sitting/lateral leans;sit to/from stand Pt Will Transfer to Toilet: with supervision;with modified independence;ambulating Pt Will Perform Toileting - Clothing Manipulation and hygiene: with supervision;with modified independence;sit to/from stand  OT Frequency: Min 2X/week   Barriers to D/C:            Co-evaluation              AM-PAC OT "6 Clicks" Daily Activity     Outcome Measure Help from another person eating meals?: None Help from another person taking care of personal grooming?: A Little Help from another person toileting, which includes using toliet, bedpan, or urinal?: A Little Help from another person bathing (including washing, rinsing, drying)?: A Little Help from another person to put on and taking off regular upper body clothing?: None Help from another person to put on and taking off regular lower body clothing?: A Little 6 Click Score: 20   End of Session Equipment Utilized During Treatment: Gait belt;Rolling walker Nurse Communication: Mobility status  Activity Tolerance: Patient tolerated treatment well Patient left: in chair;with call bell/phone within reach;with chair alarm set  OT Visit Diagnosis: Unsteadiness on feet (R26.81);Other abnormalities of gait and mobility (R26.89);Pain Pain - Right/Left: Left Pain - part of body: Leg                Time: 1610-9604 OT Time Calculation (min): 28 min Charges:  OT General Charges $OT Visit: 1 Visit OT Evaluation $OT Eval Low Complexity: 1 Low OT Treatments $Self Care/Home Management : 8-22  mins    Margaretmary Eddy [facility] 12/15/2020, 2:24 PM

## 2020-12-15 NOTE — Progress Notes (Signed)
Spoke with pt just now about removing foley catheter and POD1 protocol. Pt would like to keep the foley because he stated that he does not want to move his leg to void. I have educated pt on why we should remove the foley and the possibility of a CAUTI. He said that he would like to follow up with the MD about possibly keeping it in for a few days.

## 2020-12-15 NOTE — Progress Notes (Signed)
PT Cancellation Note  Patient Details Name: Brian Atkinson MRN: [id number] DOB: 10-31-1950   Cancelled Treatment:    Reason Eval/Treat Not Completed: Pain limiting ability to participate - pt refusing PT eval today secondary to "12/10" pain, requests PT come back tomorrow. Will check back then.  Marye Round, PT Acute Rehabilitation Services Pager [phone]  Office [phone]    Truddie Coco 12/15/2020, 2:34 PM

## 2020-12-16 LAB — BASIC METABOLIC PANEL
Anion gap: 5 (ref 5–15)
BUN: 10 mg/dL (ref 8–23)
CO2: 27 mmol/L (ref 22–32)
Calcium: 8 mg/dL — ABNORMAL LOW (ref 8.9–10.3)
Chloride: 106 mmol/L (ref 98–111)
Creatinine, Ser: 0.95 mg/dL (ref 0.61–1.24)
GFR, Estimated: >60 mL/min (ref >60)
Glucose, Bld: 109 mg/dL — ABNORMAL HIGH (ref 70–99)
Potassium: 4.6 mmol/L (ref 3.5–5.1)
Sodium: 138 mmol/L (ref 135–145)

## 2020-12-16 MED ORDER — OXYCODONE HCL 5 MG PO TABS
15.0000 mg | ORAL_TABLET | ORAL | Status: DC | PRN
  Administered 2020-12-16: 15 mg via ORAL
  Administered 2020-12-17 (×2): 20 mg via ORAL
  Filled 2020-12-16 (×3): qty 4

## 2020-12-16 MED ORDER — NALOXONE HCL 0.4 MG/ML IJ SOLN: 0.4000 mg | INTRAMUSCULAR | Status: DC | PRN

## 2020-12-16 MED ORDER — KETOROLAC TROMETHAMINE 15 MG/ML IJ SOLN
15.0000 mg | Freq: Three times a day (TID) | INTRAMUSCULAR | Status: DC
  Administered 2020-12-16 – 2020-12-17 (×4): 15 mg via INTRAVENOUS
  Filled 2020-12-16 (×4): qty 1

## 2020-12-16 NOTE — Evaluation (Signed)
Physical Therapy Evaluation Patient Details Name: Brian Atkinson MRN: [id number] DOB: May 22, 1951 Today's Date: 12/16/2020   History of Present Illness  Pt is 70 yo male s/p osteotomy L tibia malunion, repair of L tibia nonunion with reamed IM aspirate, and removal of deep implant on 3/8 after CT scan demonstrated nonunion. Pt with history of multiple surgeries to address L bicondylar tibial plateau fracture/complications due to 15 ft fall 07/2020.  Clinical Impression  Pt admitted with above diagnosis. Pt very pleasant and motivated.  He is making good progress for POD #2 with good pain control and adherence to NWB precautions.  Pt with support at home and necessary DME.  He is able to stay on the first floor of his house but would like to go upstairs if possible - discussed safe techniques (see below). He tolerated exercises well and had a good understanding of his limitations. Pt with good rehab potential. Pt currently with functional limitations due to the deficits listed below (see PT Problem List). Pt will benefit from skilled PT to increase their independence and safety with mobility to allow discharge to the venue listed below.       Follow Up Recommendations No PT follow up;Supervision for mobility/OOB (no initial PT f/u - possible outpt in future)    Equipment Recommendations  None recommended by PT    Recommendations for Other Services       Precautions / Restrictions Precautions Precautions: Fall Restrictions LLE Weight Bearing: Non weight bearing Other Position/Activity Restrictions: Per PA note: Motion of left knee as tolerated but no active knee extension against resistance. Continue with aggressive ice and elevation. Patient will resume a hinged knee brace use when he returns home.  Do not let knee rest in flexion.  Place pillows under ankle or use blue bone foam roll      Mobility  Bed Mobility Overal bed mobility: Needs Assistance Bed Mobility: Supine to Sit      Supine to sit: Min assist     General bed mobility comments: Min A for L LE    Transfers Overall transfer level: Needs assistance Equipment used: Rolling walker (2 wheeled) Transfers: Sit to/from Stand Sit to Stand: Min guard         General transfer comment: Min guard for safety; did well with NWB  Ambulation/Gait Ambulation/Gait assistance: Min guard Gait Distance (Feet): 5 Feet Assistive device: Rolling walker (2 wheeled)   Gait velocity: decreased   General Gait Details: hop on R LE; good compliance with NWB; educated on RW use  Stairs Stairs:  (Pt reports flight of steps at his house if he wants to go to bedroom (has been sleeping on couch).  Discussed hopping up flight would be difficulty.  Educated on scooting up on buttock or he reports he could even go outside and go around in w/c.)          Wheelchair Mobility    Modified Rankin (Stroke Patients Only)       Balance Overall balance assessment: Needs assistance Sitting-balance support: Feet supported;No upper extremity supported Sitting balance-Leahy Scale: Good     Standing balance support: Bilateral upper extremity supported;During functional activity Standing balance-Leahy Scale: Poor Standing balance comment: Used RW but was steady on RW and R LE                             Pertinent Vitals/Pain Pain Assessment: 0-10 Pain Score: 2  Pain Location: L LE  Pain Descriptors / Indicators: Aching;Discomfort Pain Intervention(s): Limited activity within patient's tolerance;Monitored during session;Premedicated before session;Repositioned;Ice applied    Home Living Family/patient expects to be discharged to:: Private residence Living Arrangements: Children;Spouse/significant other Available Help at Discharge: Family;Available 24 hours/day Type of Home: House Home Access: Level entry     Home Layout: Able to live on main level with bedroom/bathroom;Multi-level;Full bath on main  level Home Equipment: Walker - 2 wheels;Cane - single point;Crutches;Bedside commode;Wheelchair - manual Additional Comments: states he has a wheelchair ordered from [employer]    Prior Function Level of Independence: Independent with assistive device(s)   Gait / Transfers Assistance Needed: uses RW or crutches - was able to do short community ambulation  ADL's / Homemaking Assistance Needed: was Ind with ADLs/selfcare        Hand Dominance   Dominant Hand: Right    Extremity/Trunk Assessment   Upper Extremity Assessment Upper Extremity Assessment: Overall WFL for tasks assessed    Lower Extremity Assessment Lower Extremity Assessment: LLE deficits/detail;RLE deficits/detail RLE Deficits / Details: WFL LLE Deficits / Details: Expected post op changes.  ROM: able to flex/ext toes, ankle WFL -some limitations due to ace wrap, knee gentle ROM at ~5 to 60 degrees, hip WFL; MMT: ankle and hip 3/5 not further tested, knee 2/5    Cervical / Trunk Assessment Cervical / Trunk Assessment: Normal  Communication   Communication: No difficulties  Cognition Arousal/Alertness: Awake/alert Behavior During Therapy: WFL for tasks assessed/performed Overall Cognitive Status: Within Functional Limits for tasks assessed                                        General Comments General comments (skin integrity, edema, etc.): VSS   Provided below HEP and precaution education.  Educated on correct positioning for exercise and technique for AAROM.   Access Code: [email or web address] URL: [email or web address] Date: 12/16/2020 Prepared by: Royetta Asal  Program Notes Precautions: -Non weight bearing. -No resistance exercises. -Do NOT rest with knee bent.  Rest with blue "bone foam" pillow or pillow under ankle. -When resting keep elevated. -Ice frequently, make sure to have barrier between skin and ice.   Activity: Recommend frequent short bouts of walking in house.  Up  and moving every couple hours.   Exercises Supine Ankle Pumps - 10 x daily - 7 x weekly - 1 sets - 10 reps Supine Quadricep Sets - 10 x daily - 7 x weekly - 1 sets - 10 reps Seated Long Arc Quad - 2 x daily - 7 x weekly - 1 sets - 10 reps Seated Heel Slide - 2 x daily - 7 x weekly - 1 sets - 10 reps Supine Heel Slides - 2 x daily - 7 x weekly - 1 sets - 10 reps Supine Hip Abduction - 2 x daily - 7 x weekly - 1 sets - 10 reps     Exercises General Exercises - Lower Extremity Ankle Circles/Pumps: AROM;10 reps;Seated;Both (also toe flex/ext on L) Quad Sets: AROM;Both;10 reps;Supine (gentle quad contraction) Long Arc Quad: AAROM;Left;10 reps;Seated (provided AAROM and education on use of R LE to assist) Heel Slides: AAROM;Left;10 reps;Seated (gentle ROM)   Assessment/Plan    PT Assessment Patient needs continued PT services  PT Problem List Decreased strength;Decreased mobility;Decreased safety awareness;Decreased activity tolerance;Decreased balance;Decreased knowledge of use of DME;Pain;Decreased range of motion;Decreased knowledge of precautions       PT Treatment  Interventions DME instruction;Therapeutic activities;Modalities;Gait training;Therapeutic exercise;Patient/family education;Stair training;Balance training;Functional mobility training;Wheelchair mobility training    PT Goals (Current goals can be found in the Care Plan section)  Acute Rehab PT Goals Patient Stated Goal: go home PT Goal Formulation: With patient Time For Goal Achievement: 12/30/20 Potential to Achieve Goals: Good    Frequency Min 5X/week   Barriers to discharge        Co-evaluation               AM-PAC PT "6 Clicks" Mobility  Outcome Measure Help needed turning from your back to your side while in a flat bed without using bedrails?: A Little Help needed moving from lying on your back to sitting on the side of a flat bed without using bedrails?: A Little Help needed moving to and from a  bed to a chair (including a wheelchair)?: A Little Help needed standing up from a chair using your arms (e.g., wheelchair or bedside chair)?: A Little Help needed to walk in hospital room?: A Little Help needed climbing 3-5 steps with a railing? : A Lot 6 Click Score: 17    End of Session Equipment Utilized During Treatment: Gait belt Activity Tolerance: Patient tolerated treatment well Patient left: with chair alarm set;in chair;with call bell/phone within reach;with family/visitor present Nurse Communication: Mobility status PT Visit Diagnosis: Other abnormalities of gait and mobility (R26.89);Muscle weakness (generalized) (M62.81)    Time: 1120-1150 PT Time Calculation (min) (ACUTE ONLY): 30 min   Charges:   PT Evaluation $PT Eval Moderate Complexity: 1 Mod PT Treatments $Therapeutic Exercise: 8-22 mins        Anise Salvo, PT Acute Rehab Services Pager [phone] [facility] [phone]    Rayetta Humphrey 12/16/2020, 12:55 PM

## 2020-12-16 NOTE — Progress Notes (Signed)
Orthopaedic Trauma Service Progress Note  Patient ID: Brian Atkinson MRN: [id number] DOB/AGE: 13-Apr-1951 70 y.o.  Subjective:  Doing ok today  Pain meds lasting about 3.5 hours then pain gets pretty severe. Current regimen working well other than being able to last 4 hours  No other issues of note  Has mobilized to chair to eat lunch    ROS As above  Objective:   VITALS:   Vitals:   12/15/20 1714 12/15/20 1956 12/16/20 0413 12/16/20 0751  BP: 130/78 (!) 149/79 112/71 130/82  Pulse: 83 91 71 78  Resp: 16  18 16   Temp: 98.7 F (37.1 C) 98.1 F (36.7 C) 97.8 F (36.6 C) 97.9 F (36.6 C)  TempSrc: Oral Oral Oral Oral  SpO2: 96% 99% 99% 98%  Weight:      Height:        Estimated body mass index is 32.08 kg/m as calculated from the following:   Height as of this encounter: 5\' 11"  (1.803 m).   Weight as of this encounter: 104.3 kg.   Intake/Output      03/09 0701 03/10 0700 03/10 0701 03/11 0700   I.V. (mL/kg) 1000 (9.6)    Other 2000    IV Piggyback     Total Intake(mL/kg) 3000 (28.8)    Urine (mL/kg/hr) 300 (0.1)    Drains 100    Blood     Total Output 400    Net +2600           LABS  Results for orders placed or performed during the hospital encounter of 12/14/20 (from the past 24 hour(s))  Basic metabolic panel     Status: Abnormal   Collection Time: 12/16/20  3:17 AM  Result Value Ref Range   Sodium 138 135 - 145 mmol/L   Potassium 4.6 3.5 - 5.1 mmol/L   Chloride 106 98 - 111 mmol/L   CO2 27 22 - 32 mmol/L   Glucose, Bld 109 (H) 70 - 99 mg/dL   BUN 10 8 - 23 mg/dL   Creatinine, Ser 02/13/21 0.61 - 1.24 mg/dL   Calcium 8.0 (L) 8.9 - 10.3 mg/dL   GFR, Estimated 02/15/21 4.65 mL/min   Anion gap 5 5 - 15     PHYSICAL EXAM:   Gen: resting comfortably in chair, NAD, appears well about to eat lunch  Lungs: unlabored, clear  Cardiac: RRR Abd: + BS, NTND Ext:       Left  lower extremity             Dressings are clean dry and intact             Prevena is functioning well.  Good seal             Swelling is stable             DPN, SPN, TN sensory function intact             EHL, FHL, lesser toe motor function intact.  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 12/14/20 [id number] 12/14/20 1511    .  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  Place pillows under ankle or use blue bone foam roll   - Pain management:             Multimodal.  Titrate accordingly   Will increase oxy IR dose to see if we can get him relieve for the 4 hour interval   - ABL  anemia/Hemodynamics             Stable   - Medical issues              No chronic medical issues   - DVT/PE prophylaxis:             Lovenox while inpatient                      xarelto 10 mg daily x 4 weeks at discharge    - ID:              Perioperative antibiotics completed              Follow-up on intraoperative cultures             Preop sed rate and CRP are normal   - Metabolic Bone Disease:             Vitamin D levels are great during his last admission   - Activity:             As above   - FEN/GI prophylaxis/Foley/Lines:             Regular diet  dc IVF    - Impediments to fracture healing:             Established nonunion   - Dispo:            continue with therapies   Home tomorrow     [name], PA-C [phone] (C) 12/16/2020, 1:00 PM  [facility] [address] [place] [address] [phone] [phone] (F)    After 5pm and on the weekends please log on to Amion, go to orthopaedics and the look under the [facility] Call for the provider(s) on call. You can also call our office at [phone] and then follow the prompts to be connected to the call team.

## 2020-12-16 NOTE — Progress Notes (Signed)
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 Pt with history of multiple surgeries to address L bicondylar tibial plateau fracture/complications due to 15 ft fall 07/2020.   OT comments  Pt making good progress with functional goals. Educated pt and his wife to call for assist to walk pt to bathroom and back to bed (wife assisting him to bed from recliner without staff). OT will continue to follow acutely to maximize level of function and safety  Follow Up Recommendations  No OT follow up;Supervision - Intermittent    Equipment Recommendations  Tub/shower seat;Other (comment) Lexicographer)    Recommendations for Other Services      Precautions / Restrictions Precautions Precautions: Fall Restrictions Weight Bearing Restrictions: Yes LLE Weight Bearing: Non weight bearing Other Position/Activity Restrictions: Per PA note: Motion of left knee as tolerated but no active knee extension against resistance. Continue with aggressive ice and elevation. Patient will resume a hinged knee brace use when he returns home.  Do not let knee rest in flexion.  Place pillows under ankle or use blue bone foam roll       Mobility Bed Mobility Overal bed mobility: Needs Assistance Bed Mobility: Supine to Sit;Sit to Supine     Supine to sit: Min guard Sit to supine: Min assist   General bed mobility comments: Min A for L LE    Transfers Overall transfer level: Needs assistance Equipment used: Rolling walker (2 wheeled) Transfers: Sit to/from Stand Sit to Stand: Min guard Stand pivot transfers: Min guard       General transfer comment: Min guard for safety; did well with NWB    Balance Overall balance  assessment: Needs assistance Sitting-balance support: Feet supported;No upper extremity supported Sitting balance-Leahy Scale: Good     Standing balance support: Bilateral upper extremity supported;During functional activity Standing balance-Leahy Scale: Poor Standing balance comment: Used RW but was steady on RW and R LE                           ADL either performed or assessed with clinical judgement   ADL Overall ADL's : Needs assistance/impaired     Grooming: Wash/dry hands;Wash/dry face;Standing;Supervision/safety;Set up;With caregiver independent assisting       Lower Body Bathing: Minimal assistance;Min guard;Sitting/lateral leans;Sit to/from stand;With caregiver independent assisting Lower Body Bathing Details (indicate cue type and reason): simulated     Lower Body Dressing: Minimal assistance;Min guard;Sitting/lateral leans;Sit to/from stand;With caregiver independent assisting   Toilet Transfer: Min guard;Ambulation;RW;With caregiver independent assisting   Toileting- Architect and Hygiene: Min guard;Sit to/from stand;With caregiver independent assisting       Functional mobility during ADLs: Min guard;Rolling walker General ADL Comments: pt and his wife educated on compensatory LB ADL techniques (L LE NWB)     Vision Baseline Vision/History: Wears glasses Patient Visual Report: No change from baseline     Perception     Praxis      Cognition Arousal/Alertness: Awake/alert Behavior During Therapy: WFL for tasks assessed/performed Overall Cognitive Status: Within Functional Limits for tasks assessed  Exercises General Exercises - Lower Extremity Ankle Circles/Pumps: AROM;10 reps;Seated;Both (also toe flex/ext on L) Quad Sets: AROM;Both;10 reps;Supine (gentle quad contraction) Long Arc Quad: AAROM;Left;10 reps;Seated (provided AAROM and education on use of R LE to  assist) Heel Slides: AAROM;Left;10 reps;Seated (gentle ROM)   Shoulder Instructions       General Comments VSS    Pertinent Vitals/ Pain       Pain Assessment: 0-10 Pain Score: 4  Pain Location: L LE Pain Descriptors / Indicators: Aching;Discomfort Pain Intervention(s): Limited activity within patient's tolerance;Monitored during session;Premedicated before session;Repositioned  Home Living Family/patient expects to be discharged to:: Private residence Living Arrangements: Children;Spouse/significant other Available Help at Discharge: Family;Available 24 hours/day Type of Home: House Home Access: Level entry     Home Layout: Able to live on main level with bedroom/bathroom;Multi-level;Full bath on main level Alternate Level Stairs-Number of Steps: 12 Alternate Level Stairs-Rails: Left;Right;Can reach both Bathroom Shower/Tub: Producer, television/film/video: Standard     Home Equipment: Environmental consultant - 2 wheels;Cane - single point;Crutches;Bedside commode;Wheelchair - manual   Additional Comments: states he has a wheelchair ordered from [employer]      Prior Functioning/Environment Level of Independence: Independent with assistive device(s)  Gait / Transfers Assistance Needed: uses RW or crutches - was able to do short community ambulation ADL's / Homemaking Assistance Needed: was Ind with ADLs/selfcare       Frequency  Min 2X/week        Progress Toward Goals  OT Goals(current goals can now be found in the care plan section)  Progress towards OT goals: Progressing toward goals  Acute Rehab OT Goals Patient Stated Goal: go home  Plan Discharge plan remains appropriate    Co-evaluation                 AM-PAC OT "6 Clicks" Daily Activity     Outcome Measure   Help from another person eating meals?: None Help from another person taking care of personal grooming?: A Little Help from another person toileting, which includes using toliet, bedpan, or urinal?:  A Little Help from another person bathing (including washing, rinsing, drying)?: A Little Help from another person to put on and taking off regular upper body clothing?: None Help from another person to put on and taking off regular lower body clothing?: A Little 6 Click Score: 20    End of Session Equipment Utilized During Treatment: Gait belt;Rolling walker;Other (comment) (3 in 1 over toilet)  OT Visit Diagnosis: Unsteadiness on feet (R26.81);Other abnormalities of gait and mobility (R26.89);Pain Pain - Right/Left: Left Pain - part of body: Leg   Activity Tolerance Patient tolerated treatment well   Patient Left in chair;with call bell/phone within reach;with chair alarm set   Nurse Communication Mobility status        Time: 1342-1400 OT Time Calculation (min): 18 min  Charges: OT General Charges $OT Visit: 1 Visit OT Treatments $Self Care/Home Management : 8-22 mins    Galen Manila 12/16/2020, 3:14 PM

## 2020-12-16 NOTE — Plan of Care (Signed)

## 2020-12-17 ENCOUNTER — Other Ambulatory Visit (HOSPITAL_COMMUNITY): Payer: Self-pay | Admitting: Orthopedic Surgery

## 2020-12-17 LAB — BASIC METABOLIC PANEL
Anion gap: 6 (ref 5–15)
BUN: 10 mg/dL (ref 8–23)
CO2: 25 mmol/L (ref 22–32)
Calcium: 8.2 mg/dL — ABNORMAL LOW (ref 8.9–10.3)
Chloride: 106 mmol/L (ref 98–111)
Creatinine, Ser: 0.95 mg/dL (ref 0.61–1.24)
GFR, Estimated: >60 mL/min (ref >60)
Glucose, Bld: 100 mg/dL — ABNORMAL HIGH (ref 70–99)
Potassium: 4.2 mmol/L (ref 3.5–5.1)
Sodium: 137 mmol/L (ref 135–145)

## 2020-12-17 MED ORDER — NALOXONE HCL 4 MG/0.1ML NA LIQD: NASAL | 0 refills | Status: DC

## 2020-12-17 MED ORDER — DOCUSATE SODIUM 100 MG PO CAPS: 100.0000 mg | ORAL_CAPSULE | Freq: Two times a day (BID) | ORAL | 0 refills | Status: DC

## 2020-12-17 MED ORDER — ACETAMINOPHEN 500 MG PO TABS: 500.0000 mg | ORAL_TABLET | Freq: Two times a day (BID) | ORAL | 0 refills | Status: DC

## 2020-12-17 MED ORDER — OXYCODONE-ACETAMINOPHEN 10-325 MG PO TABS: 1.0000 | ORAL_TABLET | Freq: Four times a day (QID) | ORAL | 0 refills | Status: DC | PRN

## 2020-12-17 MED ORDER — METHOCARBAMOL 500 MG PO TABS: 500.0000 mg | ORAL_TABLET | Freq: Four times a day (QID) | ORAL | 0 refills | Status: DC | PRN

## 2020-12-17 MED ORDER — RIVAROXABAN 10 MG PO TABS: 10.0000 mg | ORAL_TABLET | Freq: Every day | ORAL | 0 refills | Status: DC

## 2020-12-17 MED FILL — NARCAN 4 MG NASAL SPRAY: 4 | 2 days supply | Qty: 2 | Fill #0

## 2020-12-17 MED FILL — METHOCARBAMOL 500 MG TABS: 500 | 7 days supply | Qty: 60 | Fill #0

## 2020-12-17 MED FILL — ACETAMINOPHEN 500MG XT STRE: 500 | 15 days supply | Qty: 30 | Fill #0

## 2020-12-17 MED FILL — XARELTO 10 MG TABLET: 10 | 30 days supply | Qty: 30 | Fill #0

## 2020-12-17 MED FILL — OXYCODONE-APAP 10-325: 10-325 | 6 days supply | Qty: 50 | Fill #0

## 2020-12-17 MED FILL — DOCUSATE SODIUM 100 MG CAPS: 100 | 5 days supply | Qty: 10 | Fill #0

## 2020-12-17 NOTE — Progress Notes (Signed)
Orthopaedic Trauma Service Progress Note  Patient ID: Brian Atkinson MRN: [id number] DOB/AGE: 04/08/1951 70 y.o.  Subjective:  Doing great this morning Pain control much improved with increasing his dosage to oxycodone 20 mg.  He was actually able to get about 6 to 7 hours between dosages.  He was actually able to get good sleep last night Ready to go home today No other complaints or concerns   ROS As above  Objective:   VITALS:   Vitals:   12/16/20 0751 12/16/20 1614 12/16/20 1945 12/17/20 0433  BP: 130/82 (!) 124/94 135/73 134/81  Pulse: 78 80 80 74  Resp: 16 17 20 18   Temp: 97.9 F (36.6 C) 97.9 F (36.6 C) 98.3 F (36.8 C) 98.1 F (36.7 C)  TempSrc: Oral Oral Oral Oral  SpO2: 98% 99% 99% 98%  Weight:      Height:        Estimated body mass index is 32.08 kg/m as calculated from the following:   Height as of this encounter: 5\' 11"  (1.803 m).   Weight as of this encounter: 104.3 kg.   Intake/Output      03/10 0701 03/11 0700 03/11 0701 03/12 0700   I.V. (mL/kg)     Other     Total Intake(mL/kg)     Urine (mL/kg/hr)     Drains     Total Output     Net          Urine Occurrence 2 x      LABS  Results for orders placed or performed during the hospital encounter of 12/14/20 (from the past 24 hour(s))  Basic metabolic panel     Status: Abnormal   Collection Time: 12/17/20  3:17 AM  Result Value Ref Range   Sodium 137 135 - 145 mmol/L   Potassium 4.2 3.5 - 5.1 mmol/L   Chloride 106 98 - 111 mmol/L   CO2 25 22 - 32 mmol/L   Glucose, Bld 100 (H) 70 - 99 mg/dL   BUN 10 8 - 23 mg/dL   Creatinine, Ser 02/13/21 0.61 - 1.24 mg/dL   Calcium 8.2 (L) 8.9 - 10.3 mg/dL   GFR, Estimated 02/16/21 0.99 mL/min   Anion gap 6 5 - 15     PHYSICAL EXAM:   Gen: A&O x 3, very pleasant, NAD, appears well  Lungs:Clear B, unlabored  Cardiac: RRR Abd: + BS, NTND  Ext:       Left Lower Extremity    prevena removed   Incisions look fantastic   No signs of infection    No active drainage  Swelling actually looks better than preoperatively  Incision left hip is stable no drainage clean dressing  Patient demonstrates very good quad control already is able to do a straight leg raise without difficulty  DPN, SPN, TN sensory function intact  EHL, FHL, lesser toe motor function intact  Ankle flexion, extension, inversion eversion intact  Extremity is warm  + DP pulse  No deep calf tenderness  Assessment/Plan: 3 Days Post-Op   Principal Problem:   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 12/14/20 1610 12/14/20 1511    .  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  Ordered vive pressure sock as well.  Ordered an XXL to act as an above knee compression sock   This can be taken off at night and then put on in the morning               Do not let knee rest in flexion.  Place pillows under ankle or use  blue bone foam roll   - Pain management:           continue with current regimen   - ABL anemia/Hemodynamics             Stable   - Medical issues              No chronic medical issues   - DVT/PE prophylaxis:             Lovenox while inpatient                      xarelto 10 mg daily x 4 weeks at discharge    - ID:              Perioperative antibiotics completed              Follow-up on intraoperative cultures             Preop sed rate and CRP are normal   - Metabolic Bone Disease:             Vitamin D levels are great during his last admission   - Activity:             As above   - FEN/GI prophylaxis/Foley/Lines:             Regular diet             dc IVF    - Impediments to fracture healing:             Established nonunion   - Dispo:  continue with therapies              Home today  Follow-up with orthopedics in 2 weeks    [name], PA-C [phone] (C) 12/17/2020, 11:29 AM  [facility] [address] [place] [address] [phone] [place] [phone] (F)    After 5pm and on the weekends please log on to Amion, go to orthopaedics and the look under the [facility] Call for the provider(s) on call. You can also call our office at [phone] and then follow the prompts to be connected to the call team.

## 2020-12-17 NOTE — Discharge Instructions (Signed)
Orthopaedic Trauma Service Discharge Instructions   General Discharge Instructions  Orthopaedic Injuries:  Left tibial plateau nonunion treated with open reduction internal fixation using plate and screws as well as autograft from your left femur  WEIGHT BEARING STATUS: Nonweightbearing left leg.  Use crutches or walker to mobilize  RANGE OF MOTION/ACTIVITY: Left knee range of motion as tolerated.  However no knee extension against resistance.  Wear your hinged knee brace.  Activity as tolerated while maintaining weightbearing restrictions.  Bone health: Would recommend that you decrease your vitamin D to 5000 IUs daily  Wound Care: Daily dressing changes starting on 12/20/2020.  See below.  If the wound is dry you can simply put the compression sock over top incision directly on the skin  Discharge Wound Care Instructions  Do NOT apply any ointments, solutions or lotions to pin sites or surgical wounds.  These prevent needed drainage and even though solutions like hydrogen peroxide kill bacteria, they also damage cells lining the pin sites that help fight infection.  Applying lotions or ointments can keep the wounds moist and can cause them to breakdown and open up as well. This can increase the risk for infection. When in doubt call the office.  Surgical incisions should be dressed daily.  If any drainage is noted, use one layer of adaptic, then gauze, Kerlix, and an ace wrap.  Once the incision is completely dry and without drainage, it may be left open to air out.  Showering may begin 36-48 hours later.  Cleaning gently with soap and water.  Traumatic wounds should be dressed daily as well.    One layer of adaptic, gauze, Kerlix, then ace wrap.  The adaptic can be discontinued once the draining has ceased    If you have a wet to dry dressing: wet the gauze with saline the squeeze as much saline out so the gauze is moist (not soaking wet), place moistened gauze over wound, then  place a dry gauze over the moist one, followed by Kerlix wrap, then ace wrap.  DVT/PE prophylaxis: Xarelto 10 mg daily for 4 weeks  Diet: as you were eating previously.  Can use over the counter stool softeners and bowel preparations, such as Miralax, to help with bowel movements.  Narcotics can be constipating.  Be sure to drink plenty of fluids  PAIN MEDICATION USE AND EXPECTATIONS  You have likely been given narcotic medications to help control your pain.  After a traumatic event that results in an fracture (broken bone) with or without surgery, it is ok to use narcotic pain medications to help control one's pain.  We understand that everyone responds to pain differently and each individual patient will be evaluated on a regular basis for the continued need for narcotic medications. Ideally, narcotic medication use should last no more than 6-8 weeks (coinciding with fracture healing).   As a patient it is your responsibility as well to monitor narcotic medication use and report the amount and frequency you use these medications when you come to your office visit.   We would also advise that if you are using narcotic medications, you should take a dose prior to therapy to maximize you participation.  IF YOU ARE ON NARCOTIC MEDICATIONS IT IS NOT PERMISSIBLE TO OPERATE A MOTOR VEHICLE (MOTORCYCLE/CAR/TRUCK/MOPED) OR HEAVY MACHINERY DO NOT MIX NARCOTICS WITH OTHER CNS (CENTRAL NERVOUS SYSTEM) DEPRESSANTS SUCH AS ALCOHOL   STOP SMOKING OR USING NICOTINE PRODUCTS!!!!  As discussed nicotine severely impairs your body's ability to  heal surgical and traumatic wounds but also impairs bone healing.  Wounds and bone heal by forming microscopic blood vessels (angiogenesis) and nicotine is a vasoconstrictor (essentially, shrinks blood vessels).  Therefore, if vasoconstriction occurs to these microscopic blood vessels they essentially disappear and are unable to deliver necessary nutrients to the healing tissue.   This is one modifiable factor that you can do to dramatically increase your chances of healing your injury.    (This means no smoking, no nicotine gum, patches, etc)  DO NOT USE NONSTEROIDAL ANTI-INFLAMMATORY DRUGS (NSAID'S)  Using products such as Advil (ibuprofen), Aleve (naproxen), Motrin (ibuprofen) for additional pain control during fracture healing can delay and/or prevent the healing response.  If you would like to take over the counter (OTC) medication, Tylenol (acetaminophen) is ok.  However, some narcotic medications that are given for pain control contain acetaminophen as well. Therefore, you should not exceed more than 4000 mg of tylenol in a day if you do not have liver disease.  Also note that there are may OTC medicines, such as cold medicines and allergy medicines that my contain tylenol as well.  If you have any questions about medications and/or interactions please ask your doctor/PA or your pharmacist.      ICE AND ELEVATE INJURED/OPERATIVE EXTREMITY  Using ice and elevating the injured extremity above your heart can help with swelling and pain control.  Icing in a pulsatile fashion, such as 20 minutes on and 20 minutes off, can be followed.    Do not place ice directly on skin. Make sure there is a barrier between to skin and the ice pack.    Using frozen items such as frozen peas works well as the conform nicely to the are that needs to be iced.  USE AN ACE WRAP OR TED HOSE FOR SWELLING CONTROL  In addition to icing and elevation, Ace wraps or TED hose are used to help limit and resolve swelling.  It is recommended to use Ace wraps or TED hose until you are informed to stop.    When using Ace Wraps start the wrapping distally (farthest away from the body) and wrap proximally (closer to the body)   Example: If you had surgery on your leg or thing and you do not have a splint on, start the ace wrap at the toes and work your way up to the thigh        If you had surgery on your  upper extremity and do not have a splint on, start the ace wrap at your fingers and work your way up to the upper arm  IF YOU ARE IN A SPLINT OR CAST DO NOT REMOVE IT FOR ANY REASON   If your splint gets wet for any reason please contact the office immediately. You may shower in your splint or cast as long as you keep it dry.  This can be done by wrapping in a cast cover or garbage back (or similar)  Do Not stick any thing down your splint or cast such as pencils, money, or hangers to try and scratch yourself with.  If you feel itchy take benadryl as prescribed on the bottle for itching  IF YOU ARE IN A CAM BOOT (BLACK BOOT)  You may remove boot periodically. Perform daily dressing changes as noted below.  Wash the liner of the boot regularly and wear a sock when wearing the boot. It is recommended that you sleep in the boot until told otherwise  Call office for the following:  Temperature greater than 101F  Persistent nausea and vomiting  Severe uncontrolled pain  Redness, tenderness, or signs of infection (pain, swelling, redness, odor or green/yellow discharge around the site)  Difficulty breathing, headache or visual disturbances  Hives  Persistent dizziness or light-headedness  Extreme fatigue  Any other questions or concerns you may have after discharge  In an emergency, call 911 or go to an Emergency Department at a nearby hospital  HELPFUL INFORMATION  ? If you had a block, it will wear off between 8-24 hrs postop typically.  This is period when your pain may go from nearly zero to the pain you would have had postop without the block.  This is an abrupt transition but nothing dangerous is happening.  You may take an extra dose of narcotic when this happens.  ? You should wean off your narcotic medicines as soon as you are able.  Most patients will be off or using minimal narcotics before their first postop appointment.   ? We suggest you use the pain medication the  first night prior to going to bed, in order to ease any pain when the anesthesia wears off. You should avoid taking pain medications on an empty stomach as it will make you nauseous.  ? Do not drink alcoholic beverages or take illicit drugs when taking pain medications.  ? In most states it is against the law to drive while you are in a splint or sling.  And certainly against the law to drive while taking narcotics.  ? You may return to work/school in the next couple of days when you feel up to it.   ? Pain medication may make you constipated.  Below are a few solutions to try in this order: - Decrease the amount of pain medication if you aren't having pain. - Drink lots of decaffeinated fluids. - Drink prune juice and/or each dried prunes  o If the first 3 don't work start with additional solutions - Take Colace - an over-the-counter stool softener - Take Senokot - an over-the-counter laxative - Take Miralax - a stronger over-the-counter laxative     CALL THE OFFICE WITH ANY QUESTIONS OR CONCERNS: [phone]   VISIT OUR WEBSITE FOR ADDITIONAL INFORMATION: [email or web address]

## 2020-12-17 NOTE — Discharge Summary (Signed)
Orthopaedic Trauma Service (OTS) Discharge Summary   Patient ID: Brian Atkinson MRN: [id number] DOB/AGE: May 14, 1951 70 y.o.  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 Acute osteomyelitis of patella (HCC)   . Complication of anesthesia    post-op memory problems  . History of MRSA infection   . Medical history non-contributory   . Patella fracture    left      Procedures Performed: 12/14/2020- Dr. Marcelino Atkinson 1.  Repair of left tibia nonunion with reamed intramedullary aspiration autografting. 2.  Osteotomy of left tibia malunion. 3.  Removal of deep implant, left tibia.  Discharged Condition: good  Hospital Course:  71 year old male well-known to the orthopedic trauma service for multiple trauma to his left knee.  Most recent injury included left tibial plateau fracture which has gone on to nonunion.  Risks and benefits of surgery reviewed with the patient he wished to proceed with repair of his nonunion in preparation for possible total knee arthroplasty down the road.  Patient taken the operating room on 12/14/2020 for procedure noted above.  Repair of his nonunion was performed along with reamed intramedullary aspirate from left femur.  Patient tolerated procedure well.  After surgery transferred to the orthopedic floor for observation, therapies.  Postoperative day #1 patient was doing fantastic actually stated that his pain was better than preop.  Started working with therapies.  We did work on titrating his medications to getting him to a comfortable level.  He continued to progress well without any significant issues ultimately he was deemed stable for  discharge on 12/17/2020.  He will be nonweightbearing on his left leg for the next 4 to 6 weeks.  Unrestricted range of motion of his left knee.  He was covered with Ancef for perioperative antibiosis.  He was on Lovenox for DVT and PE prophylaxis during his inpatient admission and was transitioned to Xarelto 10 mg daily for the next 4 weeks at discharge.  Patient will follow-up with orthopedic trauma service in 2 weeks for follow-up x-rays and removal of sutures  Consults: None  Significant Diagnostic Studies: labs:  Results for Brian Atkinson, Brian Atkinson (MRN [id number]) as of 01/04/2021 10:08  Ref. Range 12/14/2020 21:41 12/16/2020 03:17 12/17/2020 03:17  Sodium Latest Ref Range: 135 - 145 mmol/L  138 137  Potassium Latest Ref Range: 3.5 - 5.1 mmol/L  4.6 4.2  Chloride Latest Ref Range: 98 - 111 mmol/L  106 106  CO2 Latest Ref Range: 22 - 32 mmol/L  27 25  Glucose Latest Ref Range: 70 - 99 mg/dL  109 (H) 100 (H)  BUN Latest Ref Range: 8 - 23 mg/dL  10 10  Creatinine Latest Ref Range: 0.61 - 1.24 mg/dL 0.91 0.95 0.95  Calcium Latest Ref Range: 8.9 - 10.3 mg/dL  8.0 (L) 8.2 (L)  Anion gap Latest Ref Range: 5 - _0 GFR, Estimated Latest Ref Range: >60 mL/min >60 >60 >60  WBC Latest Ref Range: 4.0 - 10.5 K/uL 11.6 (H)    RBC Latest Ref Range: 4.22 - 5.81 MIL/uL 4.06 (L)    Hemoglobin Latest Ref Range: 13.0 - 17.0 g/dL 11.0 (L)    HCT Latest Ref Range: 39.0 - 52.0 %  34.6 (L)    MCV Latest Ref Range: 80.0 - 100.0 fL 85.2    MCH Latest Ref Range: 26.0 - 34.0 pg 27.1    MCHC Latest Ref Range: 30.0 - 36.0 g/dL 31.8    RDW Latest Ref Range: 11.5 - 15.5 % 17.3 (H)    Platelets Latest Ref Range: 150 - 400 K/uL 214    nRBC Latest Ref Range: 0.0 - 0.2 % 0.0       Treatments: IV hydration, antibiotics: Ancef, analgesia: acetaminophen, Dilaudid and oxycodone, anticoagulation: Lovenox while inpatient and Xarelto at discharge, therapies: PT, OT and RN and surgery: As above  Discharge Exam:                             Orthopaedic Trauma Service Progress Note   Patient ID: Brian Atkinson MRN: [id number] DOB/AGE: 01-30-1951 70 y.o.   Subjective:   Doing great this morning Pain control much improved with increasing his dosage to oxycodone 20 mg.  He was actually able to get about 6 to 7 hours between dosages.  He was actually able to get good sleep last night Ready to go home today No other complaints or concerns     ROS As above   Objective:    VITALS:         Vitals:    12/16/20 0751 12/16/20 1614 12/16/20 1945 12/17/20 0433  BP: 130/82 (!) 124/94 135/73 134/81  Pulse: 78 80 80 74  Resp: _0 Temp: 97.9 F (36.6 C) 97.9 F (36.6 C) 98.3 F (36.8 C) 98.1 F (36.7 C)  TempSrc: Oral Oral Oral Oral  SpO2: 98% 99% 99% 98%  Weight:          Height:              Estimated body mass index is 32.08 kg/m as calculated from the following:   Height as of this encounter: 5' 11" (1.803 m).   Weight as of this encounter: 104.3 kg.     Intake/Output      03/10 0701 03/11 0700 03/11 0701 03/12 0700   I.V. (mL/kg)     Other     Total Intake(mL/kg)     Urine (mL/kg/hr)     Drains     Total Output     Net          Urine Occurrence 2 x       LABS   Lab Results Last 24 Hours       Results for orders placed or performed during the hospital encounter of 12/14/20 (from the past 24 hour(s))  Basic metabolic panel     Status: Abnormal    Collection Time: 12/17/20  3:17 AM  Result Value Ref Range    Sodium 137 135 - 145 mmol/L    Potassium 4.2 3.5 - 5.1 mmol/L    Chloride 106 98 - 111 mmol/L    CO2 25 22 - 32 mmol/L    Glucose, Bld 100 (H) 70 - 99 mg/dL    BUN 10 8 - 23 mg/dL    Creatinine, Ser 0.95 0.61 - 1.24 mg/dL    Calcium 8.2 (L) 8.9 - 10.3 mg/dL    GFR, Estimated >60 >60 mL/min    Anion gap 6 5 - 15          PHYSICAL EXAM:    Gen: A&O x 3, very pleasant, NAD, appears well  Lungs:Clear  B, unlabored  Cardiac: RRR Abd: + BS, NTND  Ext:        Left Lower Extremity              prevena removed                         Incisions look fantastic                         No signs of infection                            No active drainage             Swelling actually looks better than preoperatively             Incision left hip is stable no drainage clean dressing             Patient demonstrates very good quad control already is able to do a straight leg raise without difficulty             DPN, SPN, TN sensory function intact             EHL, FHL, lesser toe motor function intact             Ankle flexion, extension, inversion eversion intact             Extremity is warm             + DP pulse             No deep calf tenderness   Assessment/Plan: 3 Days Post-Op    Principal Problem:   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 12/14/20 4801 12/14/20 1511       .   POD/HD#: 87   70 year old male with left tibial plateau nonunion   -Left tibial plateau nonunion s/p repair of nonunion including use of autograft (reamed intramedullary aspirate)             Nonweightbearing left leg for 6 weeks             Motion of left knee as tolerated but no active knee extension against resistance             Continue with aggressive ice and elevation             Patient will resume a hinged knee brace use when he returns home             PT and OT                          Okay to start mobilizing  Dressing change today, new Mepilex applied.              Ordered vive pressure sock as well.  Ordered an XXL to act as an above knee compression sock                         This can be taken off at night and then put on in the morning               Do not let knee rest in flexion.  Place pillows under ankle or use blue bone foam roll   - Pain management:           continue with current regimen   - ABL anemia/Hemodynamics             Stable   - Medical issues              No chronic medical issues   - DVT/PE prophylaxis:             Lovenox while inpatient                      xarelto 10 mg daily x 4 weeks at discharge    - ID:              Perioperative antibiotics completed              Follow-up on intraoperative cultures             Preop sed rate and CRP are normal   - Metabolic Bone Disease:             Vitamin D levels are great during his last admission   - Activity:             As above   - FEN/GI prophylaxis/Foley/Lines:             Regular diet             dc IVF    - Impediments to fracture healing:             Established nonunion   - Dispo:            continue with therapies              Home today             Follow-up with orthopedics in 2 weeks     Disposition: Discharge disposition: 01-Home or Self Care       Discharge Instructions    Call MD / Call 911   Complete by: As directed    If you experience chest pain or shortness of breath, CALL 911 and be transported to the hospital emergency room.  If you develope a fever above 101 F, pus (white drainage) or increased drainage or redness at the wound, or calf pain, call your surgeon's office.   Constipation Prevention   Complete by: As directed    Drink plenty of fluids.  Prune juice may be helpful.  You may use a stool softener, such as Colace (over the counter) 100 mg twice a day.  Use MiraLax (over the counter) for constipation as needed.   Diet general   Complete by: As directed    Discharge instructions   Complete by: As directed     Orthopaedic Trauma Service Discharge Instructions   General Discharge Instructions  Orthopaedic Injuries:  Left tibial plateau nonunion  treated with open reduction internal fixation using plate and screws as well as autograft from your left femur  WEIGHT BEARING STATUS: Nonweightbearing left leg.  Use crutches or walker to mobilize  RANGE OF MOTION/ACTIVITY: Left knee range of motion as tolerated.  However no knee extension against resistance.  Wear your hinged knee brace.  Activity as tolerated while maintaining weightbearing restrictions.  Bone health: Would recommend that you decrease your vitamin D to 5000 IUs daily  Wound Care: Daily dressing changes starting on 12/20/2020.  See below.  If the wound is dry you can simply put the compression sock over top incision directly on the skin  Discharge Wound Care Instructions  Do NOT apply any ointments, solutions or lotions to pin sites or surgical wounds.  These prevent needed drainage and even though solutions like hydrogen peroxide kill bacteria, they also damage cells lining the pin sites that help fight infection.  Applying lotions or ointments can keep the wounds moist and can cause them to breakdown and open up as well. This can increase the risk for infection. When in doubt call the office.  Surgical incisions should be dressed daily.  If any drainage is noted, use one layer of adaptic, then gauze, Kerlix, and an ace wrap.  Once the incision is completely dry and without drainage, it may be left open to air out.  Showering may begin 36-48 hours later.  Cleaning gently with soap and water.  Traumatic wounds should be dressed daily as well.    One layer of adaptic, gauze, Kerlix, then ace wrap.  The adaptic can be discontinued once the draining has ceased    If you have a wet to dry dressing: wet the gauze with saline the squeeze as much saline out so the gauze is moist (not soaking wet), place moistened gauze over wound, then place a  dry gauze over the moist one, followed by Kerlix wrap, then ace wrap.  DVT/PE prophylaxis: Xarelto 10 mg daily for 4 weeks  Diet: as you were eating previously.  Can use over the counter stool softeners and bowel preparations, such as Miralax, to help with bowel movements.  Narcotics can be constipating.  Be sure to drink plenty of fluids  PAIN MEDICATION USE AND EXPECTATIONS  You have likely been given narcotic medications to help control your pain.  After a traumatic event that results in an fracture (broken bone) with or without surgery, it is ok to use narcotic pain medications to help control one's pain.  We understand that everyone responds to pain differently and each individual patient will be evaluated on a regular basis for the continued need for narcotic medications. Ideally, narcotic medication use should last no more than 6-8 weeks (coinciding with fracture healing).   As a patient it is your responsibility as well to monitor narcotic medication use and report the amount and frequency you use these medications when you come to your office visit.   We would also advise that if you are using narcotic medications, you should take a dose prior to therapy to maximize you participation.  IF YOU ARE ON NARCOTIC MEDICATIONS IT IS NOT PERMISSIBLE TO OPERATE A MOTOR VEHICLE (MOTORCYCLE/CAR/TRUCK/MOPED) OR HEAVY MACHINERY DO NOT MIX NARCOTICS WITH OTHER CNS (CENTRAL NERVOUS SYSTEM) DEPRESSANTS SUCH AS ALCOHOL   STOP SMOKING OR USING NICOTINE PRODUCTS!!!!  As discussed nicotine severely impairs your body's ability to heal surgical and traumatic wounds but also impairs bone healing.  Wounds and bone heal by forming microscopic blood  vessels (angiogenesis) and nicotine is a vasoconstrictor (essentially, shrinks blood vessels).  Therefore, if vasoconstriction occurs to these microscopic blood vessels they essentially disappear and are unable to deliver necessary nutrients to the healing tissue.  This  is one modifiable factor that you can do to dramatically increase your chances of healing your injury.    (This means no smoking, no nicotine gum, patches, etc)  DO NOT USE NONSTEROIDAL ANTI-INFLAMMATORY DRUGS (NSAID'S)  Using products such as Advil (ibuprofen), Aleve (naproxen), Motrin (ibuprofen) for additional pain control during fracture healing can delay and/or prevent the healing response.  If you would like to take over the counter (OTC) medication, Tylenol (acetaminophen) is ok.  However, some narcotic medications that are given for pain control contain acetaminophen as well. Therefore, you should not exceed more than 4000 mg of tylenol in a day if you do not have liver disease.  Also note that there are may OTC medicines, such as cold medicines and allergy medicines that my contain tylenol as well.  If you have any questions about medications and/or interactions please ask your doctor/PA or your pharmacist.      ICE AND ELEVATE INJURED/OPERATIVE EXTREMITY  Using ice and elevating the injured extremity above your heart can help with swelling and pain control.  Icing in a pulsatile fashion, such as 20 minutes on and 20 minutes off, can be followed.    Do not place ice directly on skin. Make sure there is a barrier between to skin and the ice pack.    Using frozen items such as frozen peas works well as the conform nicely to the are that needs to be iced.  USE AN ACE WRAP OR TED HOSE FOR SWELLING CONTROL  In addition to icing and elevation, Ace wraps or TED hose are used to help limit and resolve swelling.  It is recommended to use Ace wraps or TED hose until you are informed to stop.    When using Ace Wraps start the wrapping distally (farthest away from the body) and wrap proximally (closer to the body)   Example: If you had surgery on your leg or thing and you do not have a splint on, start the ace wrap at the toes and work your way up to the thigh        If you had surgery on your upper  extremity and do not have a splint on, start the ace wrap at your fingers and work your way up to the upper arm  IF YOU ARE IN A SPLINT OR CAST DO NOT [place]   If your splint gets wet for any reason please contact the office immediately. You may shower in your splint or cast as long as you keep it dry.  This can be done by wrapping in a cast cover or garbage back (or similar)  Do Not stick any thing down your splint or cast such as pencils, money, or hangers to try and scratch yourself with.  If you feel itchy take benadryl as prescribed on the bottle for itching  IF YOU ARE IN A CAM BOOT (BLACK BOOT)  You may remove boot periodically. Perform daily dressing changes as noted below.  Wash the liner of the boot regularly and wear a sock when wearing the boot. It is recommended that you sleep in the boot until told otherwise    Call office for the following: Temperature greater than 101F Persistent nausea and vomiting Severe uncontrolled pain Redness, tenderness,  or signs of infection (pain, swelling, redness, odor or green/yellow discharge around the site) Difficulty breathing, headache or visual disturbances Hives Persistent dizziness or light-headedness Extreme fatigue Any other questions or concerns you may have after discharge  In an emergency, call 911 or go to an Emergency Department at a nearby hospital  HELPFUL INFORMATION  If you had a block, it will wear off between 8-24 hrs postop typically.  This is period when your pain may go from nearly zero to the pain you would have had postop without the block.  This is an abrupt transition but nothing dangerous is happening.  You may take an extra dose of narcotic when this happens.  You should wean off your narcotic medicines as soon as you are able.  Most patients will be off or using minimal narcotics before their first postop appointment.   We suggest you use the pain medication the first night prior to going to  bed, in order to ease any pain when the anesthesia wears off. You should avoid taking pain medications on an empty stomach as it will make you nauseous.  Do not drink alcoholic beverages or take illicit drugs when taking pain medications.  In most states it is against the law to drive while you are in a splint or sling.  And certainly against the law to drive while taking narcotics.  You may return to work/school in the next couple of days when you feel up to it.   Pain medication may make you constipated.  Below are a few solutions to try in this order: Decrease the amount of pain medication if you aren't having pain. Drink lots of decaffeinated fluids. Drink prune juice and/or each dried prunes  If the first 3 don't work start with additional solutions Take Colace - an over-the-counter stool softener Take Senokot - an over-the-counter laxative Take Miralax - a stronger over-the-counter laxative     CALL THE OFFICE WITH ANY QUESTIONS OR CONCERNS: [phone]   VISIT OUR WEBSITE FOR ADDITIONAL INFORMATION: [email or web address]   Do not put a pillow under the knee. Place it under the heel.   Complete by: As directed    Driving restrictions   Complete by: As directed    No driving   Increase activity slowly as tolerated   Complete by: As directed    Non weight bearing   Complete by: As directed    Laterality: left   Extremity: Lower     Allergies as of 12/17/2020      Reactions   Versed [midazolam]    Memory problems   Chlorhexidine       Medication List    STOP taking these medications   ferrous sulfate 325 (65 FE) MG tablet   HYDROcodone-acetaminophen 5-325 MG tablet Commonly known as: NORCO/VICODIN     TAKE these medications   acetaminophen 500 MG tablet Commonly known as: TYLENOL Take 1 tablet (500 mg total) by mouth every 12 (twelve) hours. What changed:   how much to take  when to take this  reasons to take this   docusate sodium 100 MG  capsule Commonly known as: COLACE Take 1 capsule (100 mg total) by mouth 2 (two) times daily.   methocarbamol 500 MG tablet Commonly known as: Robaxin Take 1-2 tablets (500-1,000 mg total) by mouth every 6 (six) hours as needed for muscle spasms.   naloxone 4 MG/0.1ML Liqd nasal spray kit Commonly known as: NARCAN 1 spray in either nostril at signs of opioid overdose.  May repeat in opposite nostril with new spray in 2-3 minutes if no or minimal response   oxyCODONE-acetaminophen 10-325 MG tablet Commonly known as: Percocet Take 1-2 tablets by mouth every 6 (six) hours as needed for pain.   rivaroxaban 10 MG Tabs tablet Commonly known as: XARELTO Take 1 tablet (10 mg total) by mouth daily. What changed:   medication strength  how much to take  when to take this   Vitamin D3 250 MCG (10000 UT) capsule Take 10,000 Units by mouth daily.            Discharge Care Instructions  (From admission, onward)         Start     Ordered   12/17/20 0000  Non weight bearing       Question Answer Comment  Laterality left   Extremity Lower      12/17/20 1148          Follow-up Information    Altamese Farmington, MD. Schedule an appointment as soon as possible for a visit in 2 week(s).   Specialty: Orthopedic Surgery Contact information: [place] [address] [phone]               Discharge Instructions and Plan:  70 year old male repair of left tibial plateau nonunion with RIA  Weightbearing: NWB LLE Insicional and dressing care: Daily dressing changes with Adaptic, 4 x 4's, Ace wrap.  I also did order a VIVE compression sock which can be used instead of all the previous dressings.  Sock directly on the skin Orthopedic device(s): hinged knee brace, walker/crutches Showering: ok to shower and clean with soap and water  VTE prophylaxis: Xarelto 10 mg po daily x 4 weeks . Pain control: tylenol, percocet, robaxin  Bone Health/Optimization:  continue with vitamin d supplementation  Follow - up plan: 2 weeks Contact information:  Altamese Ratamosa MD, Ainsley Spinner PA-C   Signed:  Jari Pigg, PA-C [phone] (C) 12/17/2020, 11:49 AM  [facility] [place] [id number] [phone] Domingo Sep (F)

## 2020-12-17 NOTE — Progress Notes (Signed)
Physical Therapy Treatment Patient Details Name: Montee Tallman MRN: [id number] DOB: Mar 05, 1951 Today's Date: 12/17/2020    History of Present Illness Pt is 70 yo male s/p osteotomy L tibia malunion, repair of L tibia nonunion with reamed IM aspirate, and removal of deep implant on 3/8 after CT scan demonstrated nonunion. Pt with history of multiple surgeries to address L bicondylar tibial plateau fracture/complications due to 15 ft fall 07/2020.    PT Comments    Pt reports confident in his ambulation and transfers as he has been dealing with L leg injury for a while - he declined OOB activity and wanted to focus on exercises.  Pt was up with OT earlier and demonstrated safe transfers easily yesterday with PT.  Pt educated on and performed exercises.  Educated on AAROM techniques, gentle ROM, and no resistance.  Also, discussed if too painful could decrease reps or ROM of exercises.  Pt is motivated and very aware of precautions and safety concerns. Demonstrates mobility necessary to return home with family.   Follow Up Recommendations  No PT follow up;Supervision for mobility/OOB (no initial PT, possible outpt PT in future)     Equipment Recommendations  None recommended by PT    Recommendations for Other Services       Precautions / Restrictions Precautions Precautions: Fall Restrictions Weight Bearing Restrictions: Yes LLE Weight Bearing: Non weight bearing Other Position/Activity Restrictions: Per PA note: Motion of left knee as tolerated but no active knee extension against resistance. Continue with aggressive ice and elevation. Patient will resume a hinged knee brace use when he returns home.  Do not let knee rest in flexion.  Place pillows under ankle or use blue bone foam roll    Mobility  Bed Mobility Overal bed mobility: Needs Assistance Bed Mobility: Supine to Sit     Supine to sit: Supervision     General bed mobility comments: Educated on use of gait belt  to assist L LE    Transfers                 General transfer comment: Pt recently mobilized with OT.  He reports confident in his mobility as he has been dealing with this for a while.  He was safe yesterady and performed transfers easily.  Ambulation/Gait                 Stairs             Wheelchair Mobility    Modified Rankin (Stroke Patients Only)       Balance Overall balance assessment: Needs assistance Sitting-balance support: Feet supported;No upper extremity supported Sitting balance-Leahy Scale: Good         Standing balance comment: not tested today                            Cognition Arousal/Alertness: Awake/alert Behavior During Therapy: WFL for tasks assessed/performed Overall Cognitive Status: Within Functional Limits for tasks assessed                                 General Comments: Very aware of precautions and safety      Exercises General Exercises - Lower Extremity Ankle Circles/Pumps: AROM;10 reps;Both;Supine Quad Sets: AROM;Both;10 reps;Supine (gentle quad contraction) Long Arc Quad: AROM;Left;10 reps;Seated Heel Slides: AAROM;Left;Seated;20 reps (Heel slides supine with AAROM belt x 10 and Heel slides seated manual  AAROM x 10; educated on gentle motion) Hip ABduction/ADduction: AAROM;Left;10 reps;Supine (educated on gait belt to assist)    General Comments General comments (skin integrity, edema, etc.): Pt has handout on HEP.  Educated on precautions and resting postions      Pertinent Vitals/Pain Pain Assessment: 0-10 Pain Score: 1  Pain Location: L LE Pain Descriptors / Indicators: Aching;Discomfort Pain Intervention(s): Limited activity within patient's tolerance;Monitored during session    Home Living                      Prior Function            PT Goals (current goals can now be found in the care plan section) Acute Rehab PT Goals Patient Stated Goal: go home PT  Goal Formulation: With patient Time For Goal Achievement: 12/30/20 Potential to Achieve Goals: Good Progress towards PT goals: Progressing toward goals    Frequency    Min 5X/week      PT Plan Current plan remains appropriate    Co-evaluation              AM-PAC PT "6 Clicks" Mobility   Outcome Measure  Help needed turning from your back to your side while in a flat bed without using bedrails?: None Help needed moving from lying on your back to sitting on the side of a flat bed without using bedrails?: None Help needed moving to and from a bed to a chair (including a wheelchair)?: A Little Help needed standing up from a chair using your arms (e.g., wheelchair or bedside chair)?: A Little Help needed to walk in hospital room?: A Little Help needed climbing 3-5 steps with a railing? : A Lot 6 Click Score: 19    End of Session Equipment Utilized During Treatment: Gait belt Activity Tolerance: Patient tolerated treatment well Patient left: in bed;with call bell/phone within reach;with family/visitor present Nurse Communication: Mobility status PT Visit Diagnosis: Other abnormalities of gait and mobility (R26.89);Muscle weakness (generalized) (M62.81)     Time: 1610-9604 PT Time Calculation (min) (ACUTE ONLY): 20 min  Charges:  $Therapeutic Exercise: 8-22 mins                     Anise Salvo, PT Acute Rehab Services Pager [phone] [facility] [phone]     Rayetta Humphrey 12/17/2020, 11:33 AM

## 2020-12-17 NOTE — Care Management Important Message (Signed)
Important Message  Patient Details  Name: Brian Atkinson MRN: [id number] Date of Birth: 1951/04/08   Medicare Important Message Given:  Yes     Dorena Bodo 12/17/2020, 3:24 PM

## 2020-12-17 NOTE — Plan of Care (Signed)

## 2020-12-17 NOTE — Progress Notes (Signed)
Occupational Therapy Treatment Patient Details Name: Brian Atkinson MRN: [id number] DOB: [birth year] Today's Date: 12/17/2020    History of present illness Pt is 70 yo male s/p osteotomy L tibia malunion, repair of L tibia nonunion with reamed IM aspirate, and removal of deep implant on 3/8 after CT scan demonstrated nonunion. Pt with history of multiple surgeries to address L bicondylar tibial plateau fracture/complications due to 15 ft fall 07/2020.   OT comments  Pt making steady progress towards OT goals this session. Pt continues to present with impaired balance however pt with great adherence to WB restrictions and is able to complete household distance functional mobility with Rw from EOB<>sink with min guard assist. Education provided on available LB AE to assist with donning socks as pt reports difficulty with LB dressing. Pt likely to DC home later today, will continue to follow acutely for OT needs.   Follow Up Recommendations  No OT follow up;Supervision - Intermittent    Equipment Recommendations  Tub/shower seat;Other (comment) Lexicographer)    Recommendations for Other Services      Precautions / Restrictions Precautions Precautions: Fall Restrictions Weight Bearing Restrictions: Yes LLE Weight Bearing: Non weight bearing Other Position/Activity Restrictions: Per PA note: Motion of left knee as tolerated but no active knee extension against resistance. Continue with aggressive ice and elevation. Patient will resume a hinged knee brace use when he returns home.  Do not let knee rest in flexion.  Place pillows under ankle or use blue bone foam roll       Mobility Bed Mobility Overal bed mobility: Needs Assistance Bed Mobility: Supine to Sit;Sit to Supine     Supine to sit: Supervision Sit to supine: Min guard   General bed mobility comments: pt able to assist LLE back to bed, supervision for supine>sit    Transfers Overall transfer level: Needs  assistance Equipment used: Rolling walker (2 wheeled) Transfers: Sit to/from Stand Sit to Stand: Min guard         General transfer comment: min guard from EOB with EOB adjsuted to simulate height of couch at home where pt will be sleeping    Balance Overall balance assessment: Needs assistance Sitting-balance support: Feet supported;No upper extremity supported Sitting balance-Leahy Scale: Good     Standing balance support: Single extremity supported;During functional activity Standing balance-Leahy Scale: Poor Standing balance comment: at least one UE supported during ADLs                           ADL either performed or assessed with clinical judgement   ADL Overall ADL's : Needs assistance/impaired     Grooming: Oral care;Standing;Min guard;Supervision/safety Grooming Details (indicate cue type and reason): min guard- superivison for UB ADLs at sink with at least one UE supported with RW               Lower Body Dressing Details (indicate cue type and reason): education provided on available sock aids to assist with LB dressing tasks Toilet Transfer: Min guard;Ambulation;RW Toilet Transfer Details (indicate cue type and reason): simulated via functional mobility with RW and min guard assist       Tub/Shower Transfer Details (indicate cue type and reason): pt reports shower seat at home Functional mobility during ADLs: Min guard;Rolling walker       Vision       Perception     Praxis      Cognition Arousal/Alertness: Awake/alert Behavior During Therapy: [facility] for  tasks assessed/performed Overall Cognitive Status: Within Functional Limits for tasks assessed                                 General Comments: Very aware of precautions and safety        Exercises   Shoulder Instructions       General Comments  Educated on precautions and resting postions    Pertinent Vitals/ Pain       Pain Assessment: 0-10 Pain Score:  0-No pain Pain Location: L LE Pain Descriptors / Indicators: Aching;Discomfort Pain Intervention(s): Limited activity within patient's tolerance;Monitored during session  Home Living                                          Prior Functioning/Environment              Frequency  Min 2X/week        Progress Toward Goals  OT Goals(current goals can now be found in the care plan section)  Progress towards OT goals: Progressing toward goals  Acute Rehab OT Goals Patient Stated Goal: go home OT Goal Formulation: With patient Time For Goal Achievement: 12/29/20 Potential to Achieve Goals: Good  Plan Discharge plan remains appropriate;Frequency remains appropriate    Co-evaluation                 AM-PAC OT "6 Clicks" Daily Activity     Outcome Measure   Help from another person eating meals?: None Help from another person taking care of personal grooming?: None Help from another person toileting, which includes using toliet, bedpan, or urinal?: A Little Help from another person bathing (including washing, rinsing, drying)?: A Lot Help from another person to put on and taking off regular upper body clothing?: None Help from another person to put on and taking off regular lower body clothing?: A Lot 6 Click Score: 19    End of Session Equipment Utilized During Treatment: Rolling walker  OT Visit Diagnosis: Unsteadiness on feet (R26.81);Other abnormalities of gait and mobility (R26.89)   Activity Tolerance Patient tolerated treatment well   Patient Left in bed;with call bell/phone within reach;with family/visitor present   Nurse Communication Mobility status        Time: 1002-1016 OT Time Calculation (min): 14 min  Charges: OT General Charges $OT Visit: 1 Visit OT Treatments $Self Care/Home Management : 8-22 mins  Lenor Derrick., COTA/L Acute Rehabilitation Services [phone]    Barron Schmid 12/17/2020, 1:44  PM

## 2020-12-17 NOTE — Progress Notes (Signed)
Orthopedic Tech Progress Note Patient Details:  Brian Atkinson [birth year] [id number] Ordered brace Patient ID: Brian Atkinson, male   DOB: 1951-02-09, 70 y.o.   MRN: [id number]   Brian Atkinson 12/17/2020, 1:04 PM

## 2020-12-17 NOTE — Progress Notes (Signed)
Pt was given his AVS discharge summary and went over with him. IV was removed with catheter intact. Pt had no further questions.

## 2020-12-17 NOTE — Plan of Care (Signed)
  Problem: Education: Goal: Knowledge of General Education information will improve Description: Including pain rating scale, medication(s)/side effects and non-pharmacologic comfort measures Outcome: Adequate for Discharge   Problem: Health Behavior/Discharge Planning: Goal: Ability to manage health-related needs will improve Outcome: Adequate for Discharge   Problem: Clinical Measurements: Goal: Ability to maintain clinical measurements within normal limits will improve Outcome: Adequate for Discharge Goal: Will remain free from infection Outcome: Adequate for Discharge Goal: Diagnostic test results will improve Outcome: Adequate for Discharge Goal: Respiratory complications will improve Outcome: Adequate for Discharge Goal: Cardiovascular complication will be avoided Outcome: Adequate for Discharge   Problem: Acute Rehab OT Goals (only OT should resolve) Goal: Pt. Will Perform Grooming Outcome: Adequate for Discharge Goal: Pt. Will Perform Lower Body Bathing Outcome: Adequate for Discharge Goal: Pt. Will Perform Lower Body Dressing Outcome: Adequate for Discharge Goal: Pt. Will Transfer To Toilet Outcome: Adequate for Discharge Goal: Pt. Will Perform Toileting-Clothing Manipulation Outcome: Adequate for Discharge   Problem: Acute Rehab PT Goals(only PT should resolve) Goal: Pt Will Go Supine/Side To Sit Outcome: Adequate for Discharge Goal: Pt Will Go Sit To Supine/Side Outcome: Adequate for Discharge Goal: Patient Will Transfer Sit To/From Stand Outcome: Adequate for Discharge Goal: Pt Will Ambulate Outcome: Adequate for Discharge

## 2020-12-20 LAB — AEROBIC/ANAEROBIC CULTURE W GRAM STAIN (SURGICAL/DEEP WOUND)
Culture: NO GROWTH
Culture: NO GROWTH
Gram Stain: NONE SEEN

## 2020-12-26 NOTE — Op Note (Signed)
Brian Atkinson, Brian Atkinson MEDICAL RECORD NO: [id number] ACCOUNT NO: [id number] DATE OF BIRTH: Feb 23, 1951 FACILITY: [facility] LOCATION: [facility] PHYSICIAN: Doralee Albino. Carola Frost, MD  Operative Report   DATE OF PROCEDURE: 12/14/2020  LOCATION:  [facility].  PREOPERATIVE DIAGNOSIS:  Left bicondylar tibial plateau nonunion.  POSTOPERATIVE DIAGNOSES:    1.  Left bicondylar tibial plateau nonunion. 2.  Left proximal tibia malunion. 3.  Bent hardware.  PROCEDURES:    1.  Repair of left tibia nonunion with reamed intramedullary aspiration autografting. 2.  Osteotomy of left tibia malunion. 3.  Removal of deep implant, left tibia.  SURGEON:  Doralee Albino. Carola Frost, MD  ASSISTANT:  Montez Morita, PA-C  ANESTHESIA:  General.  COMPLICATIONS:  None.  TOURNIQUET:  None.  PATIENT'S DISPOSITION:  To PACU.  CONDITION:  Stable.  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  CT scan suggested area of nonunion below the subchondral area with possible malunion below that.  I did discuss with the patient preoperatively  the risks and benefits of this procedure include the possibility of infection, nerve injury, vessel injury, DVT, PE, loss of motion, recurrence of infection particularly given the prior osteomyelitis, potential for persistent nonunion, and graft  options, which included both iliac crest autografting as well as reamed intramedullary aspiration of the left femur.  After acknowledging these risks, the patient did provide consent to proceed, specifically requesting the reamed intramedullary aspirate.  DESCRIPTION OF PROCEDURE:  After administration of preoperative antibiotics, the patient  was taken to the operating room where general anesthesia was induced.  He was positioned with a bump under the left hip and his entire left lower extremity washed  with chlorhexidine and Betadine scrub and paint.  Timeout was held after draping and then we began with the proximal tibia.  Here, the old surgical incision was remade and dissection carried down to the plate.  Because of the bent plate, I chose to  proceed with removal rather than attempting direct grafting medially, which may further jeopardize his bone blood supply as well.  I was able to carefully identify and remove all screws.  I then brought in the C-arm and used a 10 blade to localize the  nonunion site.  This was developed with first a scalpel and then serial curettage extending for medially.  Because of the size of the medial gap, I then wanted to place a fixed angle device, which we did with the [employer] proximal tibial locking plate.   The graft was mixed with allograft and packed into the full size of the defect and then with the plate off the bone in order to restore appropriate valgus alignment to the proximal tibia and restore anatomic axis in appropriate alignment.  The lock  screws, however, could not be used to restore alignment as the plate would not come down to the diaphysis.  That then confirmed the presence of a malunion below the nonunion.  I took a 3.5 drill and made multiple drill holes of the malunited segment and  then completed this with an osteotome.  This enabled me then to pull the plate down to the bone and place a multitude of screws. In order to prevent some loss  of this angle, I did place a locked screw to push against the upper aspect of the tibial cortex  and prevent it from again being opposed to the plate and losing a few extra degrees.  Standard and locking screws were placed into the shaft.  Final images showed restoration of appropriate orientation to the plateau and appropriate hardware placement,   trajectory, and length.  Wound was irrigated thoroughly and closed in standard layered fashion.  It should be noted that the reamed intramedullary aspiration was obtained, of course, prior to placement of the graft and after complete development of the nonunion site.  A 3 cm incision was made proximal to the greater trochanter.  The curved  cannulated awl inserted to the tip and then the threaded guidewire across this in a center-center position of the femur.  This was followed by the engagement of the entry reamer, measuring of the femur for the appropriate size reamer, and then the  passage of the reamer aspirator collecting about 45-50 mL of graft, all of which was used in the nonunion.  The wound was irrigated and closed in standard layered fashion.  Montez Morita, PA-C, was present and assisting throughout.  He was able to close this wound while I turned my attention distally for packing of the nonunion.  PROGNOSIS:  The patient will be nonweightbearing with continued aggressive range of motion of the knee.  He remains at elevated risk for persistent nonunion given his multiple surgeries as well as the need for a total knee arthroplasty, results of which  I would expect to be compromised by his patellar baja and have an elevated risk of infection because of his prior osteomyelitis, but we are hopeful these will be mitigated by his outstanding efforts as a patient at rehab and recovery.  He will be  restarted on formal pharmacologic DVT prophylaxis.   ROH D: 12/26/2020 5:59:08 pm T: 12/26/2020 7:49:00 pm  JOB: [id number]/ [id number]

## 2021-02-26 ENCOUNTER — Encounter (HOSPITAL_COMMUNITY): Payer: Self-pay

## 2021-02-26 ENCOUNTER — Emergency Department (HOSPITAL_COMMUNITY)
Admission: EM | Admit: 2021-02-26 | Discharge: 2021-02-27 | Disposition: A | Discharge status: Home / Self Care | Payer: Medicare Other | Attending: Emergency Medicine | Admitting: Emergency Medicine

## 2021-02-26 ENCOUNTER — Other Ambulatory Visit: Payer: Self-pay

## 2021-02-26 ENCOUNTER — Emergency Department (HOSPITAL_COMMUNITY): Payer: Medicare Other

## 2021-02-26 DIAGNOSIS — K439 Ventral hernia without obstruction or gangrene: Secondary | ICD-10-CM | POA: Insufficient documentation

## 2021-02-26 DIAGNOSIS — R599 Enlarged lymph nodes, unspecified: Secondary | ICD-10-CM

## 2021-02-26 DIAGNOSIS — R1033 Periumbilical pain: Secondary | ICD-10-CM | POA: Diagnosis present

## 2021-02-26 LAB — URINALYSIS, ROUTINE W REFLEX MICROSCOPIC
Bilirubin Urine: NEGATIVE
Glucose, UA: NEGATIVE mg/dL
Hgb urine dipstick: NEGATIVE
Ketones, ur: NEGATIVE mg/dL
Leukocytes,Ua: NEGATIVE
Nitrite: NEGATIVE
Protein, ur: NEGATIVE mg/dL
Specific Gravity, Urine: 1.024 (ref 1.005–1.030)
pH: 5 (ref 5.0–8.0)

## 2021-02-26 LAB — CBC
HCT: 43.4 % (ref 39.0–52.0)
Hemoglobin: 13.7 g/dL (ref 13.0–17.0)
MCH: 26.9 pg (ref 26.0–34.0)
MCHC: 31.6 g/dL (ref 30.0–36.0)
MCV: 85.3 fL (ref 80.0–100.0)
Platelets: 339 10*3/uL (ref 150–400)
RBC: 5.09 MIL/uL (ref 4.22–5.81)
RDW: 13.6 % (ref 11.5–15.5)
WBC: 9.4 10*3/uL (ref 4.0–10.5)
nRBC: 0 % (ref 0.0–0.2)

## 2021-02-26 LAB — COMPREHENSIVE METABOLIC PANEL
ALT: 14 U/L (ref 0–44)
AST: 18 U/L (ref 15–41)
Albumin: 4 g/dL (ref 3.5–5.0)
Alkaline Phosphatase: 122 U/L (ref 38–126)
Anion gap: 7 (ref 5–15)
BUN: 15 mg/dL (ref 8–23)
CO2: 25 mmol/L (ref 22–32)
Calcium: 9.3 mg/dL (ref 8.9–10.3)
Chloride: 104 mmol/L (ref 98–111)
Creatinine, Ser: 0.89 mg/dL (ref 0.61–1.24)
GFR, Estimated: >60 mL/min (ref >60)
Glucose, Bld: 103 mg/dL — ABNORMAL HIGH (ref 70–99)
Potassium: 4.1 mmol/L (ref 3.5–5.1)
Sodium: 136 mmol/L (ref 135–145)
Total Bilirubin: 0.6 mg/dL (ref 0.3–1.2)
Total Protein: 7.6 g/dL (ref 6.5–8.1)

## 2021-02-26 LAB — LIPASE, BLOOD: Lipase: 31 U/L (ref 11–51)

## 2021-02-26 NOTE — ED Triage Notes (Signed)
Pt has umbilical hernia for a while now and today started hurting constantly.   Denies any vomiting, nausea and diarrhea.  Pain 9/10.

## 2021-02-26 NOTE — ED Provider Notes (Signed)
Emergency Medicine Provider Triage Evaluation Note  Brian Atkinson , a 70 y.o. male  was evaluated in triage.  Pt complains of abdominal pain and right at site of longstanding hernia.  He states that he was scheduled for CT scan by general surgery and was scheduled for appointment with hernia specialist however he states that this afternoon around 4 PM he had sudden onset of severe pain in the area of his hernia.  He states he has never been able to reduce this hernia.  However states that he has never had any pain with that either.  He states that he feels he worsened his hernia by using crutches for his knee issue.  He has had no bowel movement or flatulence since this episode began  Review of Systems  Positive: Abdominal pain, hernia Negative: Nausea vomiting diarrhea  Physical Exam  BP (!) 141/98 (BP Location: Right Arm)   Pulse 96   Temp 98.2 F (36.8 C) (Oral)   Resp 18   Ht 5\' 11"  (1.803 m)   Wt 104.3 kg   SpO2 96%   BMI 32.07 kg/m  Gen:   Awake, acutely uncomfortable Resp:  Normal effort  MSK:   Moves extremities without difficulty  Other:  Abdomen is soft with ventral hernia that is protruding.  No redness.  Attempted manual reduction the patient is in significant pain.  No other notable abdominal tenderness.  Medical Decision Making  Medically screening exam initiated at 7:33 PM.  Appropriate orders placed.  Hayward Rylander was informed that the remainder of the evaluation will be completed by another provider, this initial triage assessment does not replace that evaluation, and the importance of remaining in the ED until their evaluation is complete.  Patient with longstanding abdominal ventral hernia  Seems to have significant change today.  Was scheduled for CT scan by surgery.  Some concern for incarceration.  Difficult to reduce in triage given positioning and lack of analgesia.  Will obtain CT imaging given that this was planned by surgery.  Hopefully will be  reducible in major care.  Basic lab work obtained.   Jacquelyne Balint Troy, DOLE 02/26/21 1944    02/28/21, MD 02/26/21 2008

## 2021-02-27 ENCOUNTER — Encounter (HOSPITAL_COMMUNITY): Payer: Self-pay | Admitting: Emergency Medicine

## 2021-02-27 DIAGNOSIS — K439 Ventral hernia without obstruction or gangrene: Secondary | ICD-10-CM | POA: Diagnosis not present

## 2021-02-27 MED ORDER — KETOROLAC TROMETHAMINE 30 MG/ML IJ SOLN: 30.0000 mg | Freq: Once | INTRAMUSCULAR | Status: DC

## 2021-02-27 MED ORDER — KETOROLAC TROMETHAMINE 30 MG/ML IJ SOLN
30.0000 mg | Freq: Once | INTRAMUSCULAR | Status: AC
  Administered 2021-02-27: 30 mg via INTRAMUSCULAR
  Filled 2021-02-27: qty 1

## 2021-02-27 MED ORDER — DICLOFENAC SODIUM ER 100 MG PO TB24: 100.0000 mg | ORAL_TABLET | Freq: Every day | ORAL | 0 refills | Status: DC

## 2021-02-27 NOTE — ED Provider Notes (Signed)
[facility] EMERGENCY DEPARTMENT Provider Note   CSN: [id number] Arrival date & time: 02/26/21  1845     History Chief Complaint  Patient presents with  . Abdominal Pain    Brian Atkinson is a 70 y.o. male.  The history is provided by the patient.  Abdominal Pain Pain location:  Periumbilical Pain quality: aching   Pain radiates to:  Does not radiate Pain severity:  Severe Onset quality:  Sudden Timing:  Constant Progression:  Worsening (markedly worse today, and was told if ipain changed to come in per Dr. Andrey Campanile) Chronicity:  Recurrent Context: not sick contacts   Relieved by:  Nothing Worsened by:  Nothing Ineffective treatments:  None tried Associated symptoms: no anorexia, no chest pain, no constipation, no cough, no diarrhea, no dysuria, no fever, no nausea and no vomiting   Associated symptoms comment:  Is passing gas appropriately  Risk factors: no recent hospitalization        Past Medical History:  Diagnosis Date  . Acute osteomyelitis of patella (HCC)   . Complication of anesthesia    post-op memory problems  . History of MRSA infection   . Medical history non-contributory   . Patella fracture    left     Patient Active Problem List   Diagnosis Date Noted  . 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 Acute blood loss anemia 07/28/2020  . Closed bicondylar fracture of left tibial plateau 07/23/2020  . History of MRSA infection left patella  09/26/2017  . MRSA (methicillin resistant Staphylococcus aureus)   . Unspecified constipation 06/20/2014  . Iron deficiency anemia 06/18/2014    Past Surgical History:  Procedure Laterality Date  . BONE EXCISION Left 09/25/2017   Procedure: PARTIAL EXCISION OF LEFT PATELLA;  Surgeon: Myrene Galas, MD;  Location: [facility] OR;  Service: Orthopedics;  Laterality: Left;  . CATARACT EXTRACTION W/ INTRAOCULAR LENS  IMPLANT, BILATERAL    . COLONOSCOPY    . DEBRIDEMENT  AND CLOSURE WOUND Left 06/19/2014   Procedure: DEBRIDEMENT AND CLOSURE LEFT KNEE;  Surgeon: Kathryne Hitch, MD;  Location: [facility] ORS;  Service: Orthopedics;  Laterality: Left;  . EXTERNAL FIXATION LEG Left 07/23/2020   Procedure: EXTERNAL FIXATION LEG;  Surgeon: Yolonda Kida, MD;  Location: [facility] OR;  Service: Orthopedics;  Laterality: Left;  . EXTERNAL FIXATION LEG Left 07/27/2020   Procedure: Adjustment of external fixator left leg;  Surgeon: Myrene Galas, MD;  Location: [facility] OR;  Service: Orthopedics;  Laterality: Left;  . EXTERNAL FIXATION REMOVAL Left 08/12/2020   Procedure: REMOVAL EXTERNAL FIXATION LEG;  Surgeon: Myrene Galas, MD;  Location: [facility] OR;  Service: Orthopedics;  Laterality: Left;  . IRRIGATION AND DEBRIDEMENT KNEE Left 06/17/2014   Procedure: IRRIGATION AND DEBRIDEMENT KNEE;  Surgeon: Budd Palmer, MD;  Location: [facility] ORS;  Service: Orthopedics;  Laterality: Left;  . KNEE BURSECTOMY Left 05/27/2015  . KNEE BURSECTOMY Left 05/27/2015   Procedure: KNEE BURSECTOMY, suture removal patellar tendon;  Surgeon: Myrene Galas, MD;  Location: [facility] OR;  Service: Orthopedics;  Laterality: Left;  . ORIF PATELLA Left 06/12/2014   Procedure: OPEN REDUCTION INTERNAL (ORIF) FIXATION PATELLA;  Surgeon: Budd Palmer, MD;  Location: [facility] OR;  Service: Orthopedics;  Laterality: Left;  . ORIF TIBIA PLATEAU Left 08/12/2020  . ORIF TIBIA PLATEAU Left 08/12/2020   Procedure: OPEN REDUCTION INTERNAL FIXATION (ORIF) BICONDYLAR TIBIAL PLATEAU;  Surgeon: Myrene Galas, MD;  Location: [facility] OR;  Service: Orthopedics;  Laterality: Left;  .  ORIF TIBIA PLATEAU Left 12/14/2020   Procedure: TIBIA REPAIR USING ILLIAC CREST BONE GRAFT VERSUS REAMED INTRAMEDULLARY ASPIRATE;  Surgeon: Myrene Galas, MD;  Location: [facility] OR;  Service: Orthopedics;  Laterality: Left;  . REFRACTIVE SURGERY Bilateral   . TONSILLECTOMY         History reviewed. No pertinent family history.  Social History   Tobacco Use  . Smoking  status: Never Smoker  . Smokeless tobacco: Never Used  Vaping Use  . Vaping Use: Never used  Substance Use Topics  . Alcohol use: Yes    Comment: rare  . Drug use: No    Home Medications Prior to Admission medications   Medication Sig Start Date End Date Taking? Authorizing Provider  Diclofenac Sodium CR 100 MG 24 hr tablet Take 1 tablet (100 mg total) by mouth daily. 02/27/21  Yes Arnel Wymer, MD  acetaminophen (TYLENOL) 500 MG tablet TAKE 1 TABLET (500 MG TOTAL) BY MOUTH EVERY TWELVE HOURS. Patient taking differently: Take 500 mg by mouth in the morning and at bedtime. 12/17/20 12/17/21  Montez Morita, PA-C  Cholecalciferol (VITAMIN D3) 250 MCG (10000 UT) capsule Take 10,000 Units by mouth daily.    [provider]  docusate sodium (COLACE) 100 MG capsule TAKE 1 CAPSULE (100 MG TOTAL) BY MOUTH TWO TIMES DAILY. Patient taking differently: Take 100 mg by mouth 2 (two) times daily. 12/17/20 12/17/21  Montez Morita, PA-C  methocarbamol (ROBAXIN) 500 MG tablet TAKE 1-2 TABLETS (500-1,000 MG TOTAL) BY MOUTH EVERY SIX HOURS AS NEEDED FOR MUSCLE SPASMS. Patient taking differently: Take 500-1,000 mg by mouth every 6 (six) hours as needed for muscle spasms. 12/17/20 12/17/21  Montez Morita, PA-C  naloxone [facility]) nasal spray 4 mg/0.1 mL USE AS DIRECTED IN EITHER NOSTRIL AT SIGNS OF OPIOD OVERDOSE. MAY REPEAT IN OPPOSITE NOSTRIL WITH NEW SPRAY IN 2-3 MINUTES IF MINIMAL RESPONSE Patient taking differently: Place 4 mg into the nose as needed (overdose). 12/17/20 12/17/21  Montez Morita, PA-C  oxyCODONE-acetaminophen (PERCOCET) 10-325 MG tablet TAKE 1-2 TABLETS BY MOUTH EVERY SIX HOURS AS NEEDED FOR PAIN. Patient taking differently: Take 1-2 tablets by mouth every 6 (six) hours as needed for pain. 12/17/20 06/15/21  Montez Morita, PA-C  rivaroxaban (XARELTO) 10 MG TABS tablet TAKE 1 TABLET (10 MG TOTAL) BY MOUTH DAILY. Patient taking differently: Take 10 mg by mouth daily. 12/17/20 12/17/21  Montez Morita, PA-C   vitamin C (ASCORBIC ACID) 500 MG tablet TAKE 1 TABLET (1,000 MG TOTAL) BY MOUTH DAILY. Patient taking differently: Take 1,000 mg by mouth daily. 07/28/20 07/28/21  Montez Morita, PA-C  Vitamin D3 (VITAMIN D) 25 MCG tablet TAKE 2 TABLETS (2,000 UNITS TOTAL) BY MOUTH DAILY. Patient taking differently: Take 2,000 Units by mouth daily. 08/16/20 08/16/21  Montez Morita, PA-C  enoxaparin (LOVENOX) 40 MG/0.4ML injection Inject 0.4 mLs (40 mg total) into the skin daily. 07/28/20 08/16/20  Montez Morita, PA-C  ferrous sulfate 325 (65 FE) MG tablet Take 1 tablet (325 mg total) by mouth 3 (three) times daily with meals. Patient not taking: Reported on 12/09/2020 07/28/20 12/17/20  Montez Morita, PA-C    Allergies    Versed [midazolam] and Chlorhexidine  Review of Systems   Review of Systems  Constitutional: Negative for fever.  HENT: Negative for facial swelling.   Eyes: Negative for redness.  Respiratory: Negative for cough.   Cardiovascular: Negative for chest pain.  Gastrointestinal: Positive for abdominal pain. Negative for anorexia, constipation, diarrhea, nausea and vomiting.  Genitourinary: Negative for dysuria.  Musculoskeletal: Negative for arthralgias.  Skin: Negative for rash.  Neurological: Negative for facial asymmetry.  Psychiatric/Behavioral: Negative for agitation.  All other systems reviewed and are negative.   Physical Exam Updated Vital Signs BP (!) 146/111   Pulse 89   Temp 98 F (36.7 C) (Oral)   Resp 16   Ht 5\' 11"  (1.803 m)   Wt 104.3 kg   SpO2 99%   BMI 32.07 kg/m   Physical Exam Vitals and nursing note reviewed.  Constitutional:      General: He is not in acute distress.    Appearance: He is well-developed.  HENT:     Head: Normocephalic and atraumatic.     Nose: Nose normal.  Eyes:     Conjunctiva/sclera: Conjunctivae normal.     Pupils: Pupils are equal, round, and reactive to light.  Cardiovascular:     Rate and Rhythm: Normal rate and regular rhythm.      Pulses: Normal pulses.     Heart sounds: Normal heart sounds.  Pulmonary:     Effort: Pulmonary effort is normal.     Breath sounds: Normal breath sounds.  Abdominal:     General: Bowel sounds are normal.     Palpations: Abdomen is soft.     Tenderness: There is no guarding or rebound.     Comments: Small ventral hernia, no palpable bowel  Musculoskeletal:        General: Normal range of motion.     Cervical back: Normal range of motion and neck supple.  Skin:    General: Skin is warm and dry.     Capillary Refill: Capillary refill takes less than 2 seconds.  Neurological:     General: No focal deficit present.     Mental Status: He is alert and oriented to person, place, and time.     Deep Tendon Reflexes: Reflexes normal.  Psychiatric:        Mood and Affect: Mood normal.     ED Results / Procedures / Treatments   Labs (all labs ordered are listed, but only abnormal results are displayed) Results for orders placed or performed during the hospital encounter of 02/26/21  Lipase, blood  Result Value Ref Range   Lipase 31 11 - 51 U/L  Comprehensive metabolic panel  Result Value Ref Range   Sodium 136 135 - 145 mmol/L   Potassium 4.1 3.5 - 5.1 mmol/L   Chloride 104 98 - 111 mmol/L   CO2 25 22 - 32 mmol/L   Glucose, Bld 103 (H) 70 - 99 mg/dL   BUN 15 8 - 23 mg/dL   Creatinine, Ser 02/28/21 0.61 - 1.24 mg/dL   Calcium 9.3 8.9 - 0.86 mg/dL   Total Protein 7.6 6.5 - 8.1 g/dL   Albumin 4.0 3.5 - 5.0 g/dL   AST 18 15 - 41 U/L   ALT 14 0 - 44 U/L   Alkaline Phosphatase 122 38 - 126 U/L   Total Bilirubin 0.6 0.3 - 1.2 mg/dL   GFR, Estimated 57.8 >46 mL/min   Anion gap 7 5 - 15  CBC  Result Value Ref Range   WBC 9.4 4.0 - 10.5 K/uL   RBC 5.09 4.22 - 5.81 MIL/uL   Hemoglobin 13.7 13.0 - 17.0 g/dL   HCT >96 29.5 - 28.4 %   MCV 85.3 80.0 - 100.0 fL   MCH 26.9 26.0 - 34.0 pg   MCHC 31.6 30.0 - 36.0 g/dL   RDW 13.2 44.0 -  15.5 %   Platelets 339 150 - 400 K/uL   nRBC 0.0 0.0 -  0.2 %  Urinalysis, Routine w reflex microscopic Urine, Clean Catch  Result Value Ref Range   Color, Urine YELLOW YELLOW   APPearance CLEAR CLEAR   Specific Gravity, Urine 1.024 1.005 - 1.030   pH 5.0 5.0 - 8.0   Glucose, UA NEGATIVE NEGATIVE mg/dL   Hgb urine dipstick NEGATIVE NEGATIVE   Bilirubin Urine NEGATIVE NEGATIVE   Ketones, ur NEGATIVE NEGATIVE mg/dL   Protein, ur NEGATIVE NEGATIVE mg/dL   Nitrite NEGATIVE NEGATIVE   Leukocytes,Ua NEGATIVE NEGATIVE   CT ABDOMEN PELVIS WO CONTRAST  Result Date: 02/26/2021 CLINICAL DATA:  Known umbilical hernia, onset of pain today. EXAM: CT ABDOMEN AND PELVIS WITHOUT CONTRAST TECHNIQUE: Multidetector CT imaging of the abdomen and pelvis was performed following the standard protocol without IV contrast. COMPARISON:  Most recent CT 07/23/2020 FINDINGS: Lower chest: Linear and hypoventilatory atelectasis in the lung bases. No pleural fluid. Heart size normal. Hepatobiliary: Multiple cysts throughout the liver similar to prior exam. Gallbladder physiologically distended, no calcified Taaffe. No biliary dilatation. Pancreas: No ductal dilatation or inflammation. Spleen: Normal in size without focal abnormality. Adrenals/Urinary Tract: Normal adrenal glands. No hydronephrosis. Mild symmetric perinephric edema similar to prior exam. No renal calculi. Nondistended urinary bladder. Stomach/Bowel: Small hiatal hernia. Fluid distended stomach. Small bowel loops in the central mid abdomen adjacent to ventral abdominal wall hernia are mildly dilated and fluid-filled, maximal dimension 3.2 cm. No bowel enters the umbilical hernia, definitive fat plane is seen between the adjacent small bowel and hernia neck. Normal appendix. Moderate volume of colonic stool. There is mild sigmoid colonic redundancy. No colonic wall thickening or inflammation. Vascular/Lymphatic: Aortic atherosclerosis without aneurysm. Few scattered periportal and gastrohepatic nodes are not enlarged by  size criteria. There is an 11 mm left external iliac node that is new from prior exam, series 3, image 80. Additional small subcentimeter left external iliac nodes. Reproductive: Prostate is unremarkable. Other: Midline ventral abdominal wall supraumbilical hernia contains only fat, however with edema and stranding of the herniated fat. Trace fluid in the lateral aspect of the hernia sac. Hernia neck measures 13 x 9 mm. There is no bowel involvement. There is minimal edema of the subjacent fat. Fat containing bilateral inguinal hernias. No abdominopelvic ascites, free air, or focal fluid collection. Musculoskeletal: There are no acute or suspicious osseous abnormalities. IMPRESSION: 1. Midline ventral abdominal wall supraumbilical hernia contains only fat, however there is edema and inflammation of the herniated fat as well as trace fluid in the lateral aspect of the hernia sac. There is no bowel involvement. 2. Mildly dilated fluid-filled small bowel loops in the central mid abdomen adjacent to the ventral abdominal wall hernia. This may represent reactive ileus related to fat inflammation, or previous bowel involvement that has subsequently been reduced into the abdomen. 3. Mildly enlarged and prominent left external iliac nodes, nonspecific in the setting. Largest node measures 11 mm. This may be reactive, however given location, recommend correlation with PSA as well as clinical and laboratory evaluation for lymphoproliferative disorder. Aortic Atherosclerosis (ICD10-I70.0). Electronically Signed   By: Narda RutherfordMelanie  Sanford M.D.   On: 02/26/2021 20:15    Radiology CT ABDOMEN PELVIS WO CONTRAST  Result Date: 02/26/2021 CLINICAL DATA:  Known umbilical hernia, onset of pain today. EXAM: CT ABDOMEN AND PELVIS WITHOUT CONTRAST TECHNIQUE: Multidetector CT imaging of the abdomen and pelvis was performed following the standard protocol without IV contrast. COMPARISON:  Most  recent CT 07/23/2020 FINDINGS: Lower chest:  Linear and hypoventilatory atelectasis in the lung bases. No pleural fluid. Heart size normal. Hepatobiliary: Multiple cysts throughout the liver similar to prior exam. Gallbladder physiologically distended, no calcified Minion. No biliary dilatation. Pancreas: No ductal dilatation or inflammation. Spleen: Normal in size without focal abnormality. Adrenals/Urinary Tract: Normal adrenal glands. No hydronephrosis. Mild symmetric perinephric edema similar to prior exam. No renal calculi. Nondistended urinary bladder. Stomach/Bowel: Small hiatal hernia. Fluid distended stomach. Small bowel loops in the central mid abdomen adjacent to ventral abdominal wall hernia are mildly dilated and fluid-filled, maximal dimension 3.2 cm. No bowel enters the umbilical hernia, definitive fat plane is seen between the adjacent small bowel and hernia neck. Normal appendix. Moderate volume of colonic stool. There is mild sigmoid colonic redundancy. No colonic wall thickening or inflammation. Vascular/Lymphatic: Aortic atherosclerosis without aneurysm. Few scattered periportal and gastrohepatic nodes are not enlarged by size criteria. There is an 11 mm left external iliac node that is new from prior exam, series 3, image 80. Additional small subcentimeter left external iliac nodes. Reproductive: Prostate is unremarkable. Other: Midline ventral abdominal wall supraumbilical hernia contains only fat, however with edema and stranding of the herniated fat. Trace fluid in the lateral aspect of the hernia sac. Hernia neck measures 13 x 9 mm. There is no bowel involvement. There is minimal edema of the subjacent fat. Fat containing bilateral inguinal hernias. No abdominopelvic ascites, free air, or focal fluid collection. Musculoskeletal: There are no acute or suspicious osseous abnormalities. IMPRESSION: 1. Midline ventral abdominal wall supraumbilical hernia contains only fat, however there is edema and inflammation of the herniated fat as well  as trace fluid in the lateral aspect of the hernia sac. There is no bowel involvement. 2. Mildly dilated fluid-filled small bowel loops in the central mid abdomen adjacent to the ventral abdominal wall hernia. This may represent reactive ileus related to fat inflammation, or previous bowel involvement that has subsequently been reduced into the abdomen. 3. Mildly enlarged and prominent left external iliac nodes, nonspecific in the setting. Largest node measures 11 mm. This may be reactive, however given location, recommend correlation with PSA as well as clinical and laboratory evaluation for lymphoproliferative disorder. Aortic Atherosclerosis (ICD10-I70.0). Electronically Signed   By: Narda Rutherford M.D.   On: 02/26/2021 20:15    Procedures Procedures   Medications Ordered in ED Medications  ketorolac (TORADOL) 30 MG/ML injection 30 mg (has no administration in time range)    ED Course  I have reviewed the triage vital signs and the nursing notes.  Pertinent labs & imaging results that were available during my care of the patient were reviewed by me and considered in my medical decision making (see chart for details).   PO challenged successfully in the ED  Case d/w Dr. Sophronia Simas.  Nothing to do at this time.  Not really an ileus.   Will get close follow up scheduled for the patient  The patient has been informed of groin lymph node swelling and need for close follow up and outpatient PSA.  He verbalizes understanding.  This has also been printed on his discharge summary.    Stable for discharge with close follow up.    Brian Atkinson was evaluated in Emergency Department on 02/27/2021 for the symptoms described in the history of present illness. He was evaluated in the context of the global COVID-19 pandemic, which necessitated consideration that the patient might be at risk for infection with the SARS-CoV-2  virus that causes COVID-19. Institutional protocols and algorithms that  pertain to the evaluation of patients at risk for COVID-19 are in a state of rapid change based on information released by regulatory bodies including the CDC and federal and state organizations. These policies and algorithms were followed during the patient's care in the ED.  Final Clinical Impression(s) / ED Diagnoses Final diagnoses:  Enlarged lymph nodes  Ventral hernia without obstruction or gangrene    Return for intractable cough, coughing up blood, fevers >100.4 unrelieved by medication, shortness of breath, intractable vomiting, chest pain, shortness of breath, weakness, numbness, changes in speech, facial asymmetry, abdominal pain, passing out, Inability to tolerate liquids or food, cough, altered mental status or any concerns. No signs of systemic illness or infection. The patient is nontoxic-appearing on exam and vital signs are within normal limits.  I have reviewed the triage vital signs and the nursing notes. Pertinent labs & imaging results that were available during my care of the patient were reviewed by me and considered in my medical decision making (see chart for details). After history, exam, and medical workup I feel the patient has been appropriately medically screened and is safe for discharge home. Pertinent diagnoses were discussed with the patient. Patient was given return precautions.    Rx / DC Orders ED Discharge Orders         Ordered    Diclofenac Sodium CR 100 MG 24 hr tablet  Daily        02/27/21 0141           Eliot Popper, MD 02/27/21 3428

## 2021-02-27 NOTE — ED Notes (Signed)
Ice pack given to pt.

## 2021-02-27 NOTE — ED Notes (Signed)
E-signature pad unavailable at time of pt discharge. This RN discussed discharge materials with pt and answered all pt questions. Pt stated understanding of discharge material. ? ?

## 2021-02-27 NOTE — ED Notes (Signed)
Sandwich bag and ice water given to pt for PO Challenge

## 2021-03-10 ENCOUNTER — Other Ambulatory Visit (HOSPITAL_COMMUNITY): Payer: Self-pay | Admitting: General Surgery

## 2021-03-10 ENCOUNTER — Other Ambulatory Visit: Payer: Self-pay | Admitting: General Surgery

## 2021-03-10 DIAGNOSIS — Z8719 Personal history of other diseases of the digestive system: Secondary | ICD-10-CM

## 2021-03-17 ENCOUNTER — Ambulatory Visit: Payer: Self-pay | Admitting: Surgery

## 2021-03-17 NOTE — Progress Notes (Signed)
COVID Vaccine Completed: Date COVID Vaccine completed: Has received booster: COVID vaccine manufacturer: Pf[employer]    [employer]   Date of COVID positive in last 90 days:  PCP - Priscella Mann, MD Cardiologist -   Chest x-ray - 12/14/20- Epic EKG - 07/26/20- Epic Stress Test -  ECHO -  Cardiac Cath -  Pacemaker/ICD device last checked: Spinal Cord Stimulator:  Sleep Study -  CPAP -   Fasting Blood Sugar -  Checks Blood Sugar _____ times a day  Blood Thinner Instructions: Aspirin Instructions: Last Dose:  Activity level:  Can go up a flight of stairs and perform activities of daily living without stopping and without symptoms of chest pain or shortness of breath.   Able to exercise without symptoms  Unable to go up a flight of stairs without symptoms of      Anesthesia review:   Patient denies shortness of breath, fever, cough and chest pain at PAT appointment   Patient verbalized understanding of instructions that were given to them at the PAT appointment. Patient was also instructed that they will need to review over the PAT instructions again at home before surgery.

## 2021-03-17 NOTE — Patient Instructions (Addendum)
DUE TO COVID-19 ONLY ONE VISITOR IS ALLOWED TO COME WITH YOU AND STAY IN THE WAITING ROOM ONLY DURING PRE OP AND PROCEDURE.   **NO VISITORS ARE ALLOWED IN THE SHORT STAY AREA OR RECOVERY ROOM!!**  IF YOU WILL BE ADMITTED INTO THE HOSPITAL YOU ARE ALLOWED ONLY TWO SUPPORT PEOPLE DURING VISITATION HOURS ONLY (10AM -8PM)   The support person(s) may change daily. The support person(s) must pass our screening, gel in and out, and wear a mask at all times, including in the patient's room. Patients must also wear a mask when staff or their support person are in the room.  No visitors under the age of 72. Any visitor under the age of 62 must be accompanied by an adult.    COVID SWAB TESTING MUST BE COMPLETED ON:  03/24/21 @   [address] [place], [place] [address]   You are not required to quarantine, however you are required to wear a well-fitted mask when you are out and around people not in your household.  Hand Hygiene often Do NOT share personal items Notify your provider if you are in close contact with someone who has COVID or you develop fever 100.4 or greater, new onset of sneezing, cough, sore throat, shortness of breath or body aches.       Your procedure is scheduled on: 03/28/21   Report to [facility] Main  Entrance   Report to Short Stay at 5:15 AM   [facility])   Call this number if you have problems the morning of surgery [phone]   Do not eat food :After Midnight.   May have liquids until 4:15 AM day of surgery  CLEAR LIQUID DIET  Foods Allowed                                                                     Foods Excluded  Water, Black Coffee and tea, regular and decaf               liquids that you cannot  Plain Jell-O in any flavor  (No red)                                     see through such as: Fruit ices (not with fruit pulp)                                             milk, soups, orange juice              Iced Popsicles (No red)                                                  All solid food                                   Apple  juices Sports drinks like Gatorade (No red) Lightly seasoned clear broth or consume(fat free) Sugar, honey syrup     Oral Hygiene is also important to reduce your risk of infection.                                    Remember - BRUSH YOUR TEETH THE MORNING OF SURGERY WITH YOUR REGULAR TOOTHPASTE   Do NOT smoke after Midnight   Take these medicines the morning of surgery with A SIP OF WATER: Cetirizine                               You may not have any metal on your body including hair pins, jewelry, and body piercing             Do not wear lotions, powders, cologne, or deodorant              Men may shave face and neck.   Do not bring valuables to the hospital. [facility] IS NOT RESPONSIBLE   FOR VALUABLES.   Contacts, dentures or bridgework may not be worn into surgery.   Bring small overnight bag day of surgery.  Special Instructions: Bring a copy of your healthcare power of attorney and living will documents         the day of surgery if you haven't scanned them In before.              Please read over the following fact sheets you were given: IF YOU HAVE QUESTIONS ABOUT YOUR PRE OP INSTRUCTIONS PLEASE CALL [phone]   [facility] - Preparing for Surgery Before surgery, you can play an important role.  Because skin is not sterile, your skin needs to be as free of germs as possible.  You can reduce the number of germs on your skin by washing with CHG (chlorahexidine gluconate) soap before surgery.  CHG is an antiseptic cleaner which kills germs and bonds with the skin to continue killing germs even after washing. Please DO NOT use if you have an allergy to CHG or antibacterial soaps.  If your skin becomes reddened/irritated stop using the CHG and inform your nurse when you arrive at Short Stay. Do not shave (including legs and underarms) for at least 48 hours prior to the first CHG  shower.  You may shave your face/neck.  Please follow these instructions carefully:  1.  Shower with CHG Soap the night before surgery and the  morning of surgery.  2.  If you choose to wash your hair, wash your hair first as usual with your normal  shampoo.  3.  After you shampoo, rinse your hair and body thoroughly to remove the shampoo.                             4.  Use CHG as you would any other liquid soap.  You can apply chg directly to the skin and wash.  Gently with a scrungie or clean washcloth.  5.  Apply the CHG Soap to your body ONLY FROM THE NECK DOWN.   Do   not use on face/ open                           Wound or  open sores. Avoid contact with eyes, ears mouth and   genitals (private parts).                       Wash face,  Genitals (private parts) with your normal soap.             6.  Wash thoroughly, paying special attention to the area where your    surgery  will be performed.  7.  Thoroughly rinse your body with warm water from the neck down.  8.  DO NOT shower/wash with your normal soap after using and rinsing off the CHG Soap.                9.  Pat yourself dry with a clean towel.            10.  Wear clean pajamas.            11.  Place clean sheets on your bed the night of your first shower and do not  sleep with pets. Day of Surgery : Do not apply any lotions/deodorants the morning of surgery.  Please wear clean clothes to the hospital/surgery center.  FAILURE TO FOLLOW THESE INSTRUCTIONS MAY RESULT IN THE CANCELLATION OF YOUR SURGERY  PATIENT SIGNATURE_________________________________  NURSE SIGNATURE__________________________________  ________________________________________________________________________

## 2021-03-17 NOTE — Progress Notes (Signed)
Orders requested from Dr. Danise Edge office 03/17/21.

## 2021-03-22 ENCOUNTER — Encounter (HOSPITAL_COMMUNITY)
Admission: RE | Admit: 2021-03-22 | Discharge: 2021-03-22 | Disposition: A | Discharge status: Home / Self Care | Payer: Medicare Other | Source: Ambulatory Visit | Attending: Surgery | Admitting: Surgery

## 2021-03-22 ENCOUNTER — Encounter (HOSPITAL_COMMUNITY): Payer: Self-pay

## 2021-03-22 ENCOUNTER — Other Ambulatory Visit: Payer: Self-pay

## 2021-03-22 DIAGNOSIS — Z01812 Encounter for preprocedural laboratory examination: Secondary | ICD-10-CM | POA: Diagnosis not present

## 2021-03-22 LAB — SURGICAL PCR SCREEN
MRSA, PCR: NEGATIVE
Staphylococcus aureus: NEGATIVE

## 2021-03-22 NOTE — Progress Notes (Addendum)
COVID Vaccine Completed: Date COVID Vaccine completed: Has received booster: COVID vaccine manufacturer: Pfizer    [employer]   Date of COVID positive in last 90 days:  PCP - Priscella Mann, MD Cardiologist - no  Cbc, cmp , ua 02-26-21 epic Chest x-ray - 12/14/20- Epic EKG - 07/26/20- Epic Stress Test -  ECHO -  Cardiac Cath -  Pacemaker/ICD device last checked: Spinal Cord Stimulator:  Sleep Study -  CPAP -   Fasting Blood Sugar -  Checks Blood Sugar _____ times a day  Blood Thinner Instructions: Aspirin Instructions: Last Dose:  Activity level:  Can go up a flight of stairs and perform activities of daily living without stopping and without symptoms of chest pain or shortness of breath.   Able to exercise without symptoms      Anesthesia review:   Patient denies shortness of breath, fever, cough and chest pain at PAT appointment   Patient verbalized understanding of instructions that were given to them at the PAT appointment. Patient was also instructed that they will need to review over the PAT instructions again at home before surgery.

## 2021-03-24 ENCOUNTER — Other Ambulatory Visit (HOSPITAL_COMMUNITY)
Admission: RE | Admit: 2021-03-24 | Discharge: 2021-03-24 | Disposition: A | Discharge status: Home / Self Care | Payer: Medicare Other | Source: Ambulatory Visit | Attending: Surgery | Admitting: Surgery

## 2021-03-24 DIAGNOSIS — Z01812 Encounter for preprocedural laboratory examination: Secondary | ICD-10-CM | POA: Diagnosis not present

## 2021-03-24 LAB — SARS CORONAVIRUS 2 (TAT 6-24 HRS): SARS Coronavirus 2: NEGATIVE

## 2021-03-26 NOTE — Anesthesia Preprocedure Evaluation (Addendum)
Anesthesia Evaluation  Patient identified by MRN, date of birth, ID band Patient awake    Reviewed: Allergy & Precautions, NPO status , Patient's Chart, lab work & pertinent test results  History of Anesthesia Complications (+) history of anesthetic complications  Airway Mallampati: II  TM Distance: >3 FB Neck ROM: Full    Dental no notable dental hx. (+) Teeth Intact, Dental Advisory Given   Pulmonary neg pulmonary ROS,    Pulmonary exam normal breath sounds clear to auscultation       Cardiovascular negative cardio ROS Normal cardiovascular exam Rhythm:Regular Rate:Normal     Neuro/Psych negative neurological ROS  negative psych ROS   GI/Hepatic negative GI ROS, Neg liver ROS,   Endo/Other  negative endocrine ROS  Renal/GU negative Renal ROS  negative genitourinary   Musculoskeletal negative musculoskeletal ROS (+) Nonunion left tibia   Abdominal   Peds negative pediatric ROS (+)  Hematology negative hematology ROS (+) Blood dyscrasia, anemia , Hgb 12.5   Anesthesia Other Findings Day of surgery medications reviewed with patient.  Reproductive/Obstetrics negative OB ROS                            Anesthesia Physical  Anesthesia Plan  ASA: 2  Anesthesia Plan: General   Post-op Pain Management:    Induction: Intravenous  PONV Risk Score and Plan: Treatment may vary due to age or medical condition, Ondansetron and Dexamethasone  Airway Management Planned: Oral ETT and LMA  Additional Equipment: None  Intra-op Plan:   Post-operative Plan: Extubation in OR  Informed Consent: I have reviewed the patients History and Physical, chart, labs and discussed the procedure including the risks, benefits and alternatives for the proposed anesthesia with the patient or authorized representative who has indicated his/her understanding and acceptance.     Dental advisory given  Plan  Discussed with: CRNA and Anesthesiologist  Anesthesia Plan Comments: (Complication of anesthesia  post-op memory problems no VERSED)      Anesthesia Quick Evaluation

## 2021-03-27 MED ORDER — BUPIVACAINE LIPOSOME 1.3 % IJ SUSP
20.0000 mL | INTRAMUSCULAR | Status: DC
  Filled 2021-03-27: qty 20

## 2021-03-28 ENCOUNTER — Encounter (HOSPITAL_COMMUNITY): Payer: Self-pay | Admitting: Surgery

## 2021-03-28 ENCOUNTER — Encounter (HOSPITAL_COMMUNITY)
Admission: RE | Disposition: A | Discharge status: Home / Self Care | Payer: Self-pay | Source: Ambulatory Visit | Attending: Surgery

## 2021-03-28 ENCOUNTER — Inpatient Hospital Stay (HOSPITAL_COMMUNITY)
Admission: RE | Admit: 2021-03-28 | Discharge: 2021-03-29 | DRG: 337 | Disposition: A | Discharge status: Home / Self Care | Payer: Medicare Other | Attending: Surgery | Admitting: Surgery

## 2021-03-28 ENCOUNTER — Other Ambulatory Visit: Payer: Self-pay

## 2021-03-28 ENCOUNTER — Inpatient Hospital Stay (HOSPITAL_COMMUNITY): Payer: Medicare Other | Admitting: Anesthesiology

## 2021-03-28 DIAGNOSIS — K432 Incisional hernia without obstruction or gangrene: Principal | ICD-10-CM | POA: Diagnosis present

## 2021-03-28 DIAGNOSIS — Z8614 Personal history of Methicillin resistant Staphylococcus aureus infection: Secondary | ICD-10-CM

## 2021-03-28 DIAGNOSIS — K660 Peritoneal adhesions (postprocedural) (postinfection): Secondary | ICD-10-CM | POA: Diagnosis present

## 2021-03-28 HISTORY — PX: VENTRAL HERNIA REPAIR: SHX424

## 2021-03-28 HISTORY — PX: FOREIGN BODY REMOVAL: SHX962

## 2021-03-28 LAB — CBC
HCT: 39.3 % (ref 39.0–52.0)
Hemoglobin: 12.4 g/dL — ABNORMAL LOW (ref 13.0–17.0)
MCH: 27.3 pg (ref 26.0–34.0)
MCHC: 31.6 g/dL (ref 30.0–36.0)
MCV: 86.4 fL (ref 80.0–100.0)
Platelets: 233 10*3/uL (ref 150–400)
RBC: 4.55 MIL/uL (ref 4.22–5.81)
RDW: 14.6 % (ref 11.5–15.5)
WBC: 10.3 10*3/uL (ref 4.0–10.5)
nRBC: 0 % (ref 0.0–0.2)

## 2021-03-28 LAB — CREATININE, SERUM
Creatinine, Ser: 0.85 mg/dL (ref 0.61–1.24)
GFR, Estimated: >60 mL/min (ref >60)

## 2021-03-28 SURGERY — REPAIR, HERNIA, VENTRAL
Anesthesia: General

## 2021-03-28 MED ORDER — MEPERIDINE HCL 50 MG/ML IJ SOLN: 6.2500 mg | INTRAMUSCULAR | Status: DC | PRN

## 2021-03-28 MED ORDER — BUPIVACAINE-EPINEPHRINE (PF) 0.25% -1:200000 IJ SOLN
INTRAMUSCULAR | Status: AC
  Filled 2021-03-28: qty 30

## 2021-03-28 MED ORDER — ROCURONIUM BROMIDE 10 MG/ML (PF) SYRINGE
PREFILLED_SYRINGE | INTRAVENOUS | Status: DC | PRN
  Administered 2021-03-28: 50 mg via INTRAVENOUS
  Administered 2021-03-28 (×3): 10 mg via INTRAVENOUS

## 2021-03-28 MED ORDER — MIDAZOLAM HCL 2 MG/2ML IJ SOLN
INTRAMUSCULAR | Status: AC
  Filled 2021-03-28: qty 2

## 2021-03-28 MED ORDER — FENTANYL CITRATE (PF) 100 MCG/2ML IJ SOLN
INTRAMUSCULAR | Status: DC | PRN
  Administered 2021-03-28: 100 ug via INTRAVENOUS

## 2021-03-28 MED ORDER — BUPIVACAINE LIPOSOME 1.3 % IJ SUSP
INTRAMUSCULAR | Status: DC | PRN
  Administered 2021-03-28: 20 mL

## 2021-03-28 MED ORDER — CEFAZOLIN SODIUM-DEXTROSE 2-4 GM/100ML-% IV SOLN
2.0000 g | INTRAVENOUS | Status: AC
  Administered 2021-03-28: 2 g via INTRAVENOUS
  Filled 2021-03-28: qty 100

## 2021-03-28 MED ORDER — HYDROMORPHONE HCL 1 MG/ML IJ SOLN
0.5000 mg | INTRAMUSCULAR | Status: DC | PRN
  Filled 2021-03-28: qty 0.5

## 2021-03-28 MED ORDER — KETOROLAC TROMETHAMINE 15 MG/ML IJ SOLN
INTRAMUSCULAR | Status: AC
  Filled 2021-03-28: qty 1

## 2021-03-28 MED ORDER — PROPOFOL 10 MG/ML IV BOLUS
INTRAVENOUS | Status: AC
  Filled 2021-03-28: qty 20

## 2021-03-28 MED ORDER — LIDOCAINE HCL 2 % IJ SOLN
INTRAMUSCULAR | Status: AC
  Filled 2021-03-28: qty 20

## 2021-03-28 MED ORDER — DEXAMETHASONE SODIUM PHOSPHATE 10 MG/ML IJ SOLN
INTRAMUSCULAR | Status: DC | PRN
  Administered 2021-03-28: 10 mg via INTRAVENOUS

## 2021-03-28 MED ORDER — ONDANSETRON HCL 4 MG/2ML IJ SOLN
INTRAMUSCULAR | Status: AC
  Filled 2021-03-28: qty 2

## 2021-03-28 MED ORDER — EPHEDRINE SULFATE-NACL 50-0.9 MG/10ML-% IV SOSY
PREFILLED_SYRINGE | INTRAVENOUS | Status: DC | PRN
  Administered 2021-03-28: 5 mg via INTRAVENOUS
  Administered 2021-03-28: 10 mg via INTRAVENOUS
  Administered 2021-03-28: 5 mg via INTRAVENOUS

## 2021-03-28 MED ORDER — OXYCODONE HCL 5 MG/5ML PO SOLN: 5.0000 mg | Freq: Once | ORAL | Status: AC | PRN

## 2021-03-28 MED ORDER — ACETAMINOPHEN 325 MG PO TABS
650.0000 mg | ORAL_TABLET | Freq: Four times a day (QID) | ORAL | Status: DC
  Administered 2021-03-28 – 2021-03-29 (×5): 650 mg via ORAL
  Filled 2021-03-28 (×5): qty 2

## 2021-03-28 MED ORDER — CHLORHEXIDINE GLUCONATE 0.12 % MT SOLN
15.0000 mL | Freq: Once | OROMUCOSAL | Status: AC
  Administered 2021-03-28: 15 mL via OROMUCOSAL

## 2021-03-28 MED ORDER — OXYCODONE HCL 5 MG PO TABS
10.0000 mg | ORAL_TABLET | ORAL | Status: DC | PRN
  Administered 2021-03-28 – 2021-03-29 (×4): 10 mg via ORAL
  Filled 2021-03-28 (×4): qty 2

## 2021-03-28 MED ORDER — SODIUM CHLORIDE 0.9 % IR SOLN
Status: DC | PRN
  Administered 2021-03-28: 1000 mL

## 2021-03-28 MED ORDER — ACETAMINOPHEN 500 MG PO TABS
1000.0000 mg | ORAL_TABLET | ORAL | Status: AC
  Administered 2021-03-28: 1000 mg via ORAL
  Filled 2021-03-28: qty 2

## 2021-03-28 MED ORDER — ONDANSETRON HCL 4 MG/2ML IJ SOLN: 4.0000 mg | Freq: Once | INTRAMUSCULAR | Status: DC | PRN

## 2021-03-28 MED ORDER — ONDANSETRON HCL 4 MG/2ML IJ SOLN
4.0000 mg | Freq: Four times a day (QID) | INTRAMUSCULAR | Status: DC | PRN
  Administered 2021-03-29 (×2): 4 mg via INTRAVENOUS
  Filled 2021-03-28 (×2): qty 2

## 2021-03-28 MED ORDER — OXYCODONE HCL 5 MG PO TABS: 5.0000 mg | ORAL_TABLET | ORAL | Status: DC | PRN

## 2021-03-28 MED ORDER — LORATADINE 10 MG PO TABS: 10.0000 mg | ORAL_TABLET | Freq: Every day | ORAL | Status: DC | PRN

## 2021-03-28 MED ORDER — PROPOFOL 10 MG/ML IV BOLUS
INTRAVENOUS | Status: DC | PRN
  Administered 2021-03-28: 150 mg via INTRAVENOUS

## 2021-03-28 MED ORDER — ACETAMINOPHEN 160 MG/5ML PO SOLN: 325.0000 mg | ORAL | Status: DC | PRN

## 2021-03-28 MED ORDER — KETOROLAC TROMETHAMINE 15 MG/ML IJ SOLN
15.0000 mg | Freq: Three times a day (TID) | INTRAMUSCULAR | Status: DC
  Administered 2021-03-28 – 2021-03-29 (×5): 15 mg via INTRAVENOUS
  Filled 2021-03-28 (×4): qty 1

## 2021-03-28 MED ORDER — ONDANSETRON HCL 4 MG/2ML IJ SOLN
INTRAMUSCULAR | Status: DC | PRN
  Administered 2021-03-28: 4 mg via INTRAVENOUS

## 2021-03-28 MED ORDER — OXYCODONE HCL 5 MG PO TABS: 5.0000 mg | ORAL_TABLET | Freq: Once | ORAL | Status: AC | PRN

## 2021-03-28 MED ORDER — FENTANYL CITRATE (PF) 100 MCG/2ML IJ SOLN
INTRAMUSCULAR | Status: AC
  Filled 2021-03-28: qty 2

## 2021-03-28 MED ORDER — LACTATED RINGERS IV SOLN: INTRAVENOUS | Status: DC

## 2021-03-28 MED ORDER — ROCURONIUM BROMIDE 10 MG/ML (PF) SYRINGE
PREFILLED_SYRINGE | INTRAVENOUS | Status: AC
  Filled 2021-03-28: qty 10

## 2021-03-28 MED ORDER — ONDANSETRON 4 MG PO TBDP: 4.0000 mg | ORAL_TABLET | Freq: Four times a day (QID) | ORAL | Status: DC | PRN

## 2021-03-28 MED ORDER — ENOXAPARIN SODIUM 40 MG/0.4ML IJ SOSY
40.0000 mg | PREFILLED_SYRINGE | INTRAMUSCULAR | Status: DC
  Administered 2021-03-29: 40 mg via SUBCUTANEOUS
  Filled 2021-03-28: qty 0.4

## 2021-03-28 MED ORDER — SUGAMMADEX SODIUM 200 MG/2ML IV SOLN
INTRAVENOUS | Status: DC | PRN
  Administered 2021-03-28: 200 mg via INTRAVENOUS

## 2021-03-28 MED ORDER — DEXAMETHASONE SODIUM PHOSPHATE 10 MG/ML IJ SOLN
INTRAMUSCULAR | Status: AC
  Filled 2021-03-28: qty 1

## 2021-03-28 MED ORDER — BUPIVACAINE-EPINEPHRINE (PF) 0.25% -1:200000 IJ SOLN
INTRAMUSCULAR | Status: DC | PRN
  Administered 2021-03-28: 30 mL

## 2021-03-28 MED ORDER — PROCHLORPERAZINE EDISYLATE 10 MG/2ML IJ SOLN: 5.0000 mg | Freq: Four times a day (QID) | INTRAMUSCULAR | Status: DC | PRN

## 2021-03-28 MED ORDER — FENTANYL CITRATE (PF) 100 MCG/2ML IJ SOLN
25.0000 ug | INTRAMUSCULAR | Status: DC | PRN
  Administered 2021-03-28: 50 ug via INTRAVENOUS

## 2021-03-28 MED ORDER — KETAMINE HCL 10 MG/ML IJ SOLN
INTRAMUSCULAR | Status: AC
  Filled 2021-03-28: qty 1

## 2021-03-28 MED ORDER — PROCHLORPERAZINE MALEATE 10 MG PO TABS
10.0000 mg | ORAL_TABLET | Freq: Four times a day (QID) | ORAL | Status: DC | PRN
  Filled 2021-03-28: qty 1

## 2021-03-28 MED ORDER — OXYCODONE HCL 5 MG PO TABS
ORAL_TABLET | ORAL | Status: AC
  Administered 2021-03-28: 5 mg via ORAL
  Filled 2021-03-28: qty 1

## 2021-03-28 MED ORDER — KETAMINE HCL 10 MG/ML IJ SOLN
INTRAMUSCULAR | Status: DC | PRN
  Administered 2021-03-28: 30 mg via INTRAVENOUS

## 2021-03-28 MED ORDER — LIDOCAINE 2% (20 MG/ML) 5 ML SYRINGE
INTRAMUSCULAR | Status: AC
  Filled 2021-03-28: qty 5

## 2021-03-28 MED ORDER — FENTANYL CITRATE (PF) 100 MCG/2ML IJ SOLN
INTRAMUSCULAR | Status: AC
  Administered 2021-03-28: 50 ug via INTRAVENOUS
  Filled 2021-03-28: qty 2

## 2021-03-28 MED ORDER — LIDOCAINE 2% (20 MG/ML) 5 ML SYRINGE
INTRAMUSCULAR | Status: DC | PRN
  Administered 2021-03-28: 80 mg via INTRAVENOUS

## 2021-03-28 MED ORDER — ACETAMINOPHEN 325 MG PO TABS
325.0000 mg | ORAL_TABLET | ORAL | Status: DC | PRN
Start: 2021-03-28 — End: 2021-03-28

## 2021-03-28 MED ORDER — CELECOXIB 200 MG PO CAPS
400.0000 mg | ORAL_CAPSULE | ORAL | Status: AC
  Administered 2021-03-28: 400 mg via ORAL
  Filled 2021-03-28: qty 2

## 2021-03-28 SURGICAL SUPPLY — 44 items
ADH SKN CLS APL DERMABOND .7 (GAUZE/BANDAGES/DRESSINGS) ×1
APL SKNCLS STERI-STRIP NONHPOA (GAUZE/BANDAGES/DRESSINGS)
BENZOIN TINCTURE PRP APPL 2/3 (GAUZE/BANDAGES/DRESSINGS) IMPLANT
BLADE EXTENDED COATED 6.5IN (ELECTRODE) ×1 IMPLANT
COVER SURGICAL LIGHT HANDLE (MISCELLANEOUS) ×2 IMPLANT
COVER WAND RF STERILE (DRAPES) IMPLANT
DECANTER SPIKE VIAL GLASS SM (MISCELLANEOUS) ×2 IMPLANT
DERMABOND ADVANCED (GAUZE/BANDAGES/DRESSINGS) ×1
DERMABOND ADVANCED .7 DNX12 (GAUZE/BANDAGES/DRESSINGS) IMPLANT
DRAIN CHANNEL 19F RND (DRAIN) IMPLANT
DRAPE LAPAROTOMY T 98X78 PEDS (DRAPES) IMPLANT
ELECT REM PT RETURN 15FT ADLT (MISCELLANEOUS) ×2 IMPLANT
EVACUATOR SILICONE 100CC (DRAIN) IMPLANT
GAUZE SPONGE 4X4 12PLY STRL (GAUZE/BANDAGES/DRESSINGS) IMPLANT
GLOVE SURG ENC MOIS LTX SZ7 (GLOVE) ×2 IMPLANT
GLOVE SURG POLY ORTHO LF SZ7.5 (GLOVE) ×2 IMPLANT
GOWN STRL REUS W/TWL XL LVL3 (GOWN DISPOSABLE) ×4 IMPLANT
KIT BASIN OR (CUSTOM PROCEDURE TRAY) ×2 IMPLANT
KIT TURNOVER KIT A (KITS) ×2 IMPLANT
MESH ULTRAPRO 6X6 15CM15CM (Mesh General) ×1 IMPLANT
NEEDLE HYPO 22GX1.5 SAFETY (NEEDLE) IMPLANT
NS IRRIG 1000ML POUR BTL (IV SOLUTION) ×2 IMPLANT
PACK GENERAL/GYN (CUSTOM PROCEDURE TRAY) ×2 IMPLANT
PENCIL SMOKE EVACUATOR (MISCELLANEOUS) IMPLANT
SPONGE LAP 18X18 RF (DISPOSABLE) IMPLANT
STAPLER VISISTAT 35W (STAPLE) IMPLANT
STRIP CLOSURE SKIN 1/2X4 (GAUZE/BANDAGES/DRESSINGS) IMPLANT
SUT ETHILON 2 0 PS N (SUTURE) IMPLANT
SUT MNCRL AB 4-0 PS2 18 (SUTURE) IMPLANT
SUT NOVA 0 T19/GS 22DT (SUTURE) IMPLANT
SUT NOVA 1 T20/GS 25DT (SUTURE) IMPLANT
SUT NOVA NAB DX-16 0-1 5-0 T12 (SUTURE) IMPLANT
SUT PDS AB 1 TP1 96 (SUTURE) IMPLANT
SUT PROLENE 0 CT 1 CR/8 (SUTURE) IMPLANT
SUT VIC AB 2-0 SH 27 (SUTURE)
SUT VIC AB 2-0 SH 27X BRD (SUTURE) IMPLANT
SUT VIC AB 3-0 SH 18 (SUTURE) ×2 IMPLANT
SUT VIC AB 3-0 SH 27 (SUTURE)
SUT VIC AB 3-0 SH 27X BRD (SUTURE) IMPLANT
SYR CONTROL 10ML LL (SYRINGE) IMPLANT
TOWEL OR 17X26 10 PK STRL BLUE (TOWEL DISPOSABLE) ×2 IMPLANT
TOWEL OR NON WOVEN STRL DISP B (DISPOSABLE) ×2 IMPLANT
TRAY FOLEY MTR SLVR 14FR STAT (SET/KITS/TRAYS/PACK) IMPLANT
TRAY FOLEY MTR SLVR 16FR STAT (SET/KITS/TRAYS/PACK) IMPLANT

## 2021-03-28 NOTE — Transfer of Care (Signed)
Immediate Anesthesia Transfer of Care Note  Patient: Brian Atkinson  Procedure(s) Performed: Procedure(s): REPAIR OF RECURRENT VENTRAL HERNIA WITH MESH (N/A) OPEN EXCISION OF INTRABDOMINAL MESH (N/A)  Patient Location: PACU  Anesthesia Type:General  Level of Consciousness: Alert, Awake, Oriented  Airway & Oxygen Therapy: Patient Spontanous Breathing  Post-op Assessment: Report given to RN  Post vital signs: Reviewed and stable  Last Vitals:  Vitals:   03/28/21 0522  BP: 126/86  Pulse: 80  Resp: 16  Temp: 36.8 C  SpO2: 98%    Complications: No apparent anesthesia complications

## 2021-03-28 NOTE — H&P (Signed)
Admitting Physician: Brian Atkinson  Service: General Surgery  CC: Recurrent hernia  Subjective   HPI: Brian Atkinson is an 70 y.o. male who is here for recurrent ventral hernia repair.  Past Medical History:  Diagnosis Date   Acute osteomyelitis of patella (HCC)    Complication of anesthesia    post-op memory problems no versed   History of MRSA infection    Medical history non-contributory    Patella fracture    left     Past Surgical History:  Procedure Laterality Date   BONE EXCISION Left 09/25/2017   Procedure: PARTIAL EXCISION OF LEFT PATELLA;  Surgeon: Myrene Galas, MD;  Location: [facility] OR;  Service: Orthopedics;  Laterality: Left;   CATARACT EXTRACTION W/ INTRAOCULAR LENS  IMPLANT, BILATERAL     COLONOSCOPY     DEBRIDEMENT AND CLOSURE WOUND Left 06/19/2014   Procedure: DEBRIDEMENT AND CLOSURE LEFT KNEE;  Surgeon: Kathryne Hitch, MD;  Location: [facility] ORS;  Service: Orthopedics;  Laterality: Left;   EXTERNAL FIXATION LEG Left 07/23/2020   Procedure: EXTERNAL FIXATION LEG;  Surgeon: Yolonda Kida, MD;  Location: [facility] OR;  Service: Orthopedics;  Laterality: Left;   EXTERNAL FIXATION LEG Left 07/27/2020   Procedure: Adjustment of external fixator left leg;  Surgeon: Myrene Galas, MD;  Location: [facility] OR;  Service: Orthopedics;  Laterality: Left;   EXTERNAL FIXATION REMOVAL Left 08/12/2020   Procedure: REMOVAL EXTERNAL FIXATION LEG;  Surgeon: Myrene Galas, MD;  Location: [facility] OR;  Service: Orthopedics;  Laterality: Left;   IRRIGATION AND DEBRIDEMENT KNEE Left 06/17/2014   Procedure: IRRIGATION AND DEBRIDEMENT KNEE;  Surgeon: Budd Palmer, MD;  Location: [facility] ORS;  Service: Orthopedics;  Laterality: Left;   KNEE BURSECTOMY Left 05/27/2015   KNEE BURSECTOMY Left 05/27/2015   Procedure: KNEE BURSECTOMY, suture removal patellar tendon;  Surgeon: Myrene Galas, MD;  Location: [facility] OR;  Service: Orthopedics;  Laterality: Left;   ORIF PATELLA Left 06/12/2014    Procedure: OPEN REDUCTION INTERNAL (ORIF) FIXATION PATELLA;  Surgeon: Budd Palmer, MD;  Location: [facility] OR;  Service: Orthopedics;  Laterality: Left;   ORIF TIBIA PLATEAU Left 08/12/2020   ORIF TIBIA PLATEAU Left 08/12/2020   Procedure: OPEN REDUCTION INTERNAL FIXATION (ORIF) BICONDYLAR TIBIAL PLATEAU;  Surgeon: Myrene Galas, MD;  Location: [facility] OR;  Service: Orthopedics;  Laterality: Left;   ORIF TIBIA PLATEAU Left 12/14/2020   Procedure: TIBIA REPAIR USING ILLIAC CREST BONE GRAFT VERSUS REAMED INTRAMEDULLARY ASPIRATE;  Surgeon: Myrene Galas, MD;  Location: [facility] OR;  Service: Orthopedics;  Laterality: Left;   REFRACTIVE SURGERY Bilateral    TONSILLECTOMY      History reviewed. No pertinent family history.  Social:  reports that he has never smoked. He has never used smokeless tobacco. He reports previous alcohol use. He reports that he does not use drugs.  Allergies:  Allergies  Allergen Reactions   Versed [Midazolam]     Memory problems    Medications: Current Outpatient Medications  Medication Instructions   acetaminophen (TYLENOL) 500 MG tablet TAKE 1 TABLET (500 MG TOTAL) BY MOUTH EVERY TWELVE HOURS.   cetirizine (ZYRTEC) 10 mg, Oral, Daily PRN   Diclofenac Sodium CR 100 mg, Oral, Daily   docusate sodium (COLACE) 100 MG capsule TAKE 1 CAPSULE (100 MG TOTAL) BY MOUTH TWO TIMES DAILY.   methocarbamol (ROBAXIN) 500 MG tablet TAKE 1-2 TABLETS (500-1,000 MG TOTAL) BY MOUTH EVERY SIX HOURS AS NEEDED FOR MUSCLE SPASMS.   Multiple Vitamins-Minerals (ONE-A-DAY MENS 50+) TABS 1 tablet,  Oral, Daily   naloxone (NARCAN) nasal spray 4 mg/0.1 mL USE AS DIRECTED IN EITHER NOSTRIL AT SIGNS OF OPIOD OVERDOSE. MAY REPEAT IN OPPOSITE NOSTRIL WITH NEW SPRAY IN 2-3 MINUTES IF MINIMAL RESPONSE   oxyCODONE-acetaminophen (PERCOCET) 10-325 MG tablet TAKE 1-2 TABLETS BY MOUTH EVERY SIX HOURS AS NEEDED FOR PAIN.   rivaroxaban (XARELTO) 10 MG TABS tablet TAKE 1 TABLET (10 MG TOTAL) BY MOUTH DAILY.   vitamin C  (ASCORBIC ACID) 500 MG tablet TAKE 1 TABLET (1,000 MG TOTAL) BY MOUTH DAILY.   Vitamin D3 (VITAMIN D) 25 MCG tablet TAKE 2 TABLETS (2,000 UNITS TOTAL) BY MOUTH DAILY.    ROS - all of the below systems have been reviewed with the patient and positives are indicated with bold text General: chills, fever or night sweats Eyes: blurry vision or double vision ENT: epistaxis or sore throat Allergy/Immunology: itchy/watery eyes or nasal congestion Hematologic/Lymphatic: bleeding problems, blood clots or swollen lymph nodes Endocrine: temperature intolerance or unexpected weight changes Breast: new or changing breast lumps or nipple discharge Resp: cough, shortness of breath, or wheezing CV: chest pain or dyspnea on exertion GI: as per HPI GU: dysuria, trouble voiding, or hematuria MSK: joint pain or joint stiffness Neuro: TIA or stroke symptoms Derm: pruritus and skin lesion changes Psych: anxiety and depression  Objective   PE Blood pressure 126/86, pulse 80, temperature 98.2 F (36.8 C), temperature source Oral, resp. rate 16, height 5\' 11"  (1.803 m), weight 104.3 kg, SpO2 98 %. Constitutional: NAD; conversant; no deformities Eyes: Moist conjunctiva; no lid lag; anicteric; PERRL Neck: Trachea midline; no thyromegaly Lungs: Normal respiratory effort; no tactile fremitus CV: RRR; no palpable thrills; no pitting edema GI: Abd Palpable bulge above umbilicus; no palpable hepatosplenomegaly MSK: Normal range of motion of extremities; no clubbing/cyanosis Psychiatric: Appropriate affect; alert and oriented x3 Lymphatic: No palpable cervical or axillary lymphadenopathy  No results found for this or any previous visit (from the past 24 hour(s)).  Imaging Orders  No imaging studies ordered today     Assessment and Plan   Brian Atkinson is an 70 y.o. male with a recurrent incisional hernia.  He had laparoscopic IPOM with open fascial closure of an umbilical hernia by Dr. 78 in  the past.  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  He was seen in the ER due to this and a CT at that time showed a recurrent incisional hernia.  It appears one of the tack sites has developed into a hernia.  There was some dilation of the bowel on the CT and it appears it may be closely adherent to the mesh on the right side, maybe due to the trauma allowing the mesh to flap down.  We discussed the options of repairing the hernia and closing the fascia vs removal of the intraperitoneal mesh with retromuscular mesh repair of his midline.  He is interested in having the mesh remove and definitively dealing with this issue as he would like to get back to his normal very active life.  We will proceed with recurrent incisional hernia repair with mesh, removal of intraperitoneal mesh and possible bowel resection.  The risks, benefits and alternatives of this procedure were discussed with the patient who granted consent to proceed.    Andrey Campanile, MD  [facility] Use [email or web address] to contact on call provider

## 2021-03-28 NOTE — Anesthesia Postprocedure Evaluation (Signed)
Anesthesia Post Note  Patient: Brian Atkinson  Procedure(s) Performed: REPAIR OF RECURRENT VENTRAL HERNIA WITH MESH OPEN EXCISION OF INTRABDOMINAL MESH     Patient location during evaluation: PACU Anesthesia Type: General Level of consciousness: awake and alert Pain management: pain level controlled Vital Signs Assessment: post-procedure vital signs reviewed and stable Respiratory status: spontaneous breathing, nonlabored ventilation, respiratory function stable and patient connected to nasal cannula oxygen Cardiovascular status: stable and blood pressure returned to baseline Postop Assessment: no apparent nausea or vomiting Anesthetic complications: no   No notable events documented.  Last Vitals:  Vitals:   03/28/21 0522  BP: 126/86  Pulse: 80  Resp: 16  Temp: 36.8 C  SpO2: 98%    Last Pain:  Vitals:   03/28/21 0536  TempSrc:   PainSc: 0-No pain                 Ahilyn Nell

## 2021-03-28 NOTE — Op Note (Addendum)
Patient: Brian Atkinson ([birth year], [id number])  Date of Surgery: 03/28/2021   Preoperative Diagnosis: RECURRENT VENTRAL HERNIA   Postoperative Diagnosis: RECURRENT VENTRAL HERNIA   Surgical Procedure: REPAIR OF RECURRENT VENTRAL HERNIA WITH MESH: [id number] Bilateral rectus myofascial release  OPEN EXCISION OF INTRABDOMINAL MESH:    Operative Team Members:  Surgeon(s) and Role:    * Brian Atkinson, Hyman Hopes, MD - Primary    * Hedda Slade, PA-C - Assisting   Anesthesiologist: Bethena Midget, MD CRNA: Basilio Cairo, CRNA; Minerva Ends, CRNA   Anesthesia: General   Fluids:  Total I/O In: 1100 [I.V.:1000; IV Piggyback:100] Out: -   Complications: None  Drains:  None  Specimen: None  Disposition:  PACU - hemodynamically stable.  Plan of Care: Admit to inpatient   Indications for Procedure: Brian Atkinson is an 70 y.o. male with a recurrent incisional hernia.  He had laparoscopic IPOM with open fascial closure of an umbilical hernia by Dr. Andrey Campanile in the past.  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  He was seen in the ER due to this and a CT at that time showed a recurrent incisional hernia.  It appears one of the tack sites or transfacial suture sites has developed into a hernia.  There was some dilation of the bowel on the CT and it appears it may be closely adherent to the mesh on the right side, maybe due to the trauma allowing the mesh to flap down.  We discussed the options of repairing the hernia and closing the fascia vs removal of the intraperitoneal mesh with retromuscular mesh repair of his midline.  He is interested in having the mesh remove and definitively dealing with this issue as he would like to get back to his normal very active lifestyle.  We will proceed with recurrent incisional hernia repair with mesh, removal of intraperitoneal mesh and possible bowel resection.  The risks, benefits and alternatives of this  procedure were discussed with the patient who granted consent to proceed.     Findings:  Hernia Location: Ventral hernia location: Umbilical (M3) Hernia Size:  2 cm wide x 5 cm tall Mesh Size &Type:  10 cm wide x 15 cm tall Ethicon ultrapro mesh Mesh Position: Sublay - Retromuscular Myofascial Releases: Bilateral Myofascial Release: Posterior rectus myofascial release   Description of Procedure:  The patient was positioned supine on the operating room table, adequately padded and secured.  A timeout procedure was performed.  A midline incision was made and dissection was carried down through subcutaneous tissue.  The abdomen was entered safely.  There were dense fatty adhesions to the underside of the previous intraperitoneal mesh.  The bowel was not stuck to the mesh.  A meticulous and tedious sharp lysis of adhesions was carried out to free the mesh from the abdominal wall and clear the undersurface of the abdominal wall of adhesions.  This was completed with no injury to the viscera.  The previous mesh was completely removed, please see photo below.  After the anterior abdominal wall was cleared off of all adhesions, a lap safety towel was placed over the viscera to protect them.  The hernia defect was located in the umbilical region. The total hernia defect area was measured and the size was recorded above under findings.  A rectus myofascial release was performed on the LEFT side. The posterior rectus sheath was incised just lateral to the hernia edge.  This incision  in the posterior rectus sheath was extended both cephalad and caudal to the hernia defect.  Dissection was carried out laterally in the retromuscular plane to the edge of the rectus sheath progressively disconnecting the rectus muscle from the underlying posterior rectus sheath. The segmental innervation comprised of intercostal nerve branches 8, 9, 10, 11 and 12 were individually identified and preserved.  The arterial blood  supply to the rectus muscle comprised of the superior and inferior epigastric arteries along with the small terminal branches from the posterior intercostal arteries 10, 11 and 12, the subcostal artery, the posterior lumbar arteries, and the deep circumflex artery were all identified and individually preserved.  The rectus myofascial release accomplished medialization of the posterior rectus sheath towards the midline and disinsertion of the rectus muscle from its surrounding fascia, and thus its encasement in the rectus sheath, allowing for widening of the rectus muscle and transfer of the rectus flap towards the midline.  This will allow for future inset of the medial aspect of the flap for abdominal wall reconstruction.  A rectus myofascial release was performed on the RIGHT side. The posterior rectus sheath was incised just lateral to the hernia edge.  This incision in the posterior rectus sheath was extended both cephalad and caudal to the hernia defect.  Dissection was carried out laterally in the retromuscular plane to the edge of the rectus sheath progressively disconnecting the rectus muscle from the underlying posterior rectus sheath. The segmental innervation comprised of intercostal nerve branches 8, 9, 10, 11 and 12 were individually identified and preserved.  The arterial blood supply to the rectus muscle comprised of the superior and inferior epigastric arteries along with the small terminal branches from the posterior intercostal arteries 10, 11 and 12, the subcostal artery, the posterior lumbar arteries, and the deep circumflex artery were all identified and individually preserved.  The rectus myofascial release accomplished medialization of the posterior rectus sheath towards the midline and disinsertion of the rectus muscle from its surrounding fascia, and thus its encasement in the rectus sheath, allowing for widening of the rectus muscle and transfer of the rectus flap towards the midline.   This will allow for future inset of the medial aspect of the flap for abdominal wall reconstruction.  The towel was removed and the posterior fascia was reapproximated in the midline with a continuous 2-0 PDS suture. The retromuscular plane was copiously irrigated with warm normal saline. There was good hemostasis of the operative field.   A transversus abdominis plane (TAP) block was performed bilaterally with Exparel.  The anesthetic was injected into the plane between the transversus abdominis and internal abdominal oblique muscle bilaterally.  A new set of sterile gloves were used prior to handling the mesh. The mesh listed above was brought into the operative field and cut to the size specified above.  The mesh was deployed into the retromuscular position where it conformed well to the space.  It did not require any fixation.  The mesh space was irrigated with saline.  The rectus muscles were re approximated in the midline utilizing a continuous 2-0 PDS suture taking 5 mm bites of the fascia with 5 mm advancement. The fascia came together well. The subcutaneous tissue was irrigated with saline. Scarpa's fascia was reapproximated with interrupted 2-0 Vicryl suture.  The subcuticular tissue was reapproximated with buried, interrupted 2-0 Vicryl suture.  The skin was closed with a 4-0 Monocryl subcuticular suture and skin glue.  All sponge and needle counts were correct at  the end of this case.        Ivar Drape, MD General, Bariatric, & Minimally Invasive Surgery [facility], [place]

## 2021-03-28 NOTE — Anesthesia Procedure Notes (Signed)
Procedure Name: Intubation Date/Time: 03/28/2021 7:39 AM Performed by: Gerald Leitz, CRNA Pre-anesthesia Checklist: Patient identified, Patient being monitored, Timeout performed, Emergency Drugs available and Suction available Patient Re-evaluated:Patient Re-evaluated prior to induction Oxygen Delivery Method: Circle system utilized Preoxygenation: Pre-oxygenation with 100% oxygen Induction Type: IV induction Ventilation: Mask ventilation without difficulty Laryngoscope Size: Mac and 3 Grade View: Grade I Tube type: Oral Tube size: 7.5 mm Number of attempts: 1 Placement Confirmation: ETT inserted through vocal cords under direct vision, positive ETCO2 and breath sounds checked- equal and bilateral Secured at: 22 cm Tube secured with: Tape Dental Injury: Teeth and Oropharynx as per pre-operative assessment

## 2021-03-29 ENCOUNTER — Encounter (HOSPITAL_COMMUNITY): Payer: Self-pay | Admitting: Surgery

## 2021-03-29 LAB — CBC
HCT: 41.6 % (ref 39.0–52.0)
Hemoglobin: 13.3 g/dL (ref 13.0–17.0)
MCH: 27.3 pg (ref 26.0–34.0)
MCHC: 32 g/dL (ref 30.0–36.0)
MCV: 85.2 fL (ref 80.0–100.0)
Platelets: 269 10*3/uL (ref 150–400)
RBC: 4.88 MIL/uL (ref 4.22–5.81)
RDW: 14.9 % (ref 11.5–15.5)
WBC: 17.1 10*3/uL — ABNORMAL HIGH (ref 4.0–10.5)
nRBC: 0 % (ref 0.0–0.2)

## 2021-03-29 LAB — BASIC METABOLIC PANEL
Anion gap: 7 (ref 5–15)
BUN: 13 mg/dL (ref 8–23)
CO2: 26 mmol/L (ref 22–32)
Calcium: 8.8 mg/dL — ABNORMAL LOW (ref 8.9–10.3)
Chloride: 104 mmol/L (ref 98–111)
Creatinine, Ser: 0.93 mg/dL (ref 0.61–1.24)
GFR, Estimated: >60 mL/min (ref >60)
Glucose, Bld: 140 mg/dL — ABNORMAL HIGH (ref 70–99)
Potassium: 4 mmol/L (ref 3.5–5.1)
Sodium: 137 mmol/L (ref 135–145)

## 2021-03-29 MED ORDER — OXYCODONE-ACETAMINOPHEN 5-325 MG PO TABS: 1.0000 | ORAL_TABLET | ORAL | 0 refills | Status: DC | PRN

## 2021-03-29 MED ORDER — ALUM & MAG HYDROXIDE-SIMETH 200-200-20 MG/5ML PO SUSP
30.0000 mL | Freq: Four times a day (QID) | ORAL | Status: DC | PRN
  Administered 2021-03-29: 30 mL via ORAL
  Filled 2021-03-29: qty 30

## 2021-03-29 NOTE — Discharge Summary (Signed)
Patient ID: Brian Atkinson [id number] 70 y.o. 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   Acute blood loss anemia 07/28/2020   Closed bicondylar fracture of left tibial plateau 07/23/2020   History of MRSA infection left patella  09/26/2017   MRSA (methicillin resistant Staphylococcus aureus)    Unspecified constipation 06/20/2014   Iron deficiency anemia 06/18/2014     Discharge Diagnoses: Recurrent ventral hernia Patient Active Problem List   Diagnosis Date Noted   Recurrent incisional hernia 03/28/2021   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 He recovered well and was discharged the following day.  Consults: None  Significant Diagnostic Studies: none  Treatments: surgery: as above  Disposition: Home  Patient Instructions:  Allergies as of 03/29/2021       Reactions   Versed [midazolam]    Memory problems        Medication List     TAKE these medications    Acetaminophen Extra Strength 500 MG tablet Generic drug:  acetaminophen TAKE 1 TABLET (500 MG TOTAL) BY MOUTH EVERY TWELVE HOURS.   cetirizine 10 MG tablet Commonly known as: ZYRTEC Take 10 mg by mouth daily as needed for allergies.   Diclofenac Sodium CR 100 MG 24 hr tablet Take 1 tablet (100 mg total) by mouth daily.   One-A-Day Mens 50+ Tabs Take 1 tablet by mouth daily.   oxyCODONE-acetaminophen 5-325 MG tablet Commonly known as: Percocet Take 1-2 tablets by mouth every 4 (four) hours as needed for severe pain.       ASK your doctor about these medications    docusate sodium 100 MG capsule Commonly known as: COLACE TAKE 1 CAPSULE (100 MG TOTAL) BY MOUTH TWO TIMES DAILY.   methocarbamol 500 MG tablet Commonly known as: ROBAXIN TAKE 1-2 TABLETS (500-1,000 MG TOTAL) BY MOUTH EVERY SIX HOURS AS NEEDED FOR MUSCLE SPASMS.   Narcan 4 MG/0.1ML Liqd nasal spray kit Generic drug: naloxone USE AS DIRECTED IN EITHER NOSTRIL AT SIGNS OF OPIOD OVERDOSE. MAY REPEAT IN OPPOSITE NOSTRIL WITH NEW SPRAY IN 2-3 MINUTES IF MINIMAL RESPONSE   oxyCODONE-acetaminophen 10-325 MG tablet Commonly known as: PERCOCET TAKE 1-2 TABLETS BY MOUTH EVERY SIX HOURS AS NEEDED FOR PAIN.   vitamin C 500 MG tablet Commonly known as: ASCORBIC ACID TAKE 1 TABLET (1,000 MG TOTAL) BY MOUTH DAILY.   Vitamin D3 25 MCG tablet Commonly known as: Vitamin D TAKE 2 TABLETS (2,000 UNITS TOTAL) BY MOUTH DAILY.   Xarelto 10 MG Tabs tablet Generic drug: rivaroxaban TAKE 1 TABLET (10 MG TOTAL) BY MOUTH DAILY.        Activity: no heavy lifting for 4 weeks Diet: regular diet Wound Care: keep wound  clean and dry  Follow-up:  With Dr. Thermon Leyland in 4 weeks.  Signed: Nickola Major Graylee Arutyunyan General, Bariatric, & Minimally Invasive Surgery [facility], [place]   03/29/2021, 1:48 PM

## 2021-03-29 NOTE — Progress Notes (Signed)
Provided discharge education/instructions, all questions and concerns addressed, Pt not in distress. Pt to discharge home with belongings accompanied by family.

## 2021-03-29 NOTE — Plan of Care (Signed)
  Problem: Clinical Measurements: Goal: Ability to maintain clinical measurements within normal limits will improve Outcome: Progressing   Problem: Skin Integrity: Goal: Demonstration of wound healing without infection will improve Outcome: Progressing   Problem: Activity: Goal: Risk for activity intolerance will decrease Outcome: Progressing   Problem: Nutrition: Goal: Adequate nutrition will be maintained Outcome: Progressing   Problem: Coping: Goal: Level of anxiety will decrease Outcome: Progressing   Problem: Elimination: Goal: Will not experience complications related to bowel motility Outcome: Progressing

## 2021-03-29 NOTE — Discharge Instructions (Signed)
 VENTRAL HERNIA REPAIR POST OPERATIVE INSTRUCTIONS  Thinking Clearly  The anesthesia may cause you to feel different for 1 or 2 days. Do not drive, drink alcohol, or make any big decisions for at least 2 days.  Nutrition When you wake up, you will be able to drink small amounts of liquid. If you do not feel sick, you can slowly advance your diet to regular foods. Continue to drink lots of fluids, usually about 8 to 10 glasses per day. Eat a high-fiber diet so you don't strain during bowel movements. High-Fiber Foods Foods high in fiber include beans, bran cereals and whole-grain breads, peas, dried fruit (figs, apricots, and dates), raspberries, blackberries, strawberries, sweet corn, broccoli, baked potatoes with skin, plums, pears, apples, greens, and nuts. Activity Slowly increase your activity. Be sure to get up and walk every hour or so to prevent blood clots. No heavy lifting or strenuous activity for 4 weeks following surgery to prevent hernias at your incision sites or recurrence of your hernia. It is normal to feel tired. You may need more sleep than usual.  Get your rest but make sure to get up and move around frequently to prevent blood clots and pneumonia.  Work and Return to School You can go back to work when you feel well enough. Discuss the timing with your surgeon. You can usually go back to school or work 1 week or less after an laparoscopic or an open repair. If your work requires heavy lifting or strenuous activity you need to be placed on light duty for 4 weeks following surgery. You can return to gym class, sports or other physical activities 4 weeks after surgery.  Wound Care You may experience significant bruising throughout the abdominal wall that may track down into the groin including into the scrotum in males.  Rest, elevating the groin and scrotum above the level of the heart, ice and compression with tight fitting underwear or an abdominal binder can help.   Always wash your hands before and after touching near your incision site. Do not soak in a bathtub until cleared at your follow up appointment. You may take a shower 24 hours after surgery. A small amount of drainage from the incision is normal. If the drainage is thick and yellow or the site is red, you may have an infection, so call your surgeon. If you have a drain in one of your incisions, it will be taken out in office when the drainage stops. Steri-Strips will fall off in 7 to 10 days or they will be removed during your first office visit. If you have dermabond glue covering over the incision, allow the glue to flake off on its own. Protect the new skin, especially from the sun. The sun can burn and cause darker scarring. Your scar will heal in about 4 to 6 weeks and will become softer and continue to fade over the next year.  The cosmetic appearance of the incisions will improve over the course of the first year after surgery. Sensation around your incision will return in a few weeks or months.  Bowel Movements After intestinal surgery, you may have loose watery stools for several days. If watery diarrhea lasts longer than 3 days, contact your surgeon. Pain medication (narcotics) can cause constipation. Increase the fiber in your diet with high-fiber foods if you are constipated. You can take an over the counter stool softener like Colace to avoid constipation.  Additional over the counter medications can also be used   if Colace isn't sufficient (for example, Milk of Magnesia or Miralax).  Pain The amount of pain is different for each person. Some people need only 1 to 3 doses of pain control medication, while others need more. Take alternating doses of tylenol and ibuprofen around the clock for the first five days following surgery.  This will provide a baseline of pain control and help with inflammation.  Take the narcotic pain medication in addition if needed for severe pain.  Contact  Your Surgeon at [phone], if you have: Pain that will not go away Pain that gets worse A fever of more than 101F (38.3C) Repeated vomiting Swelling, redness, bleeding, or bad-smelling drainage from your wound site Strong abdominal pain No bowel movement or unable to pass gas for 3 days Watery diarrhea lasting longer than 3 days  Pain Control The goal of pain control is to minimize pain, keep you moving and help you heal. Your surgical team will work with you on your pain plan. Most often a combination of therapies and medications are used to control your pain. You may also be given medication (local anesthetic) at the surgical site. This may help control your pain for several days. Extreme pain puts extra stress on your body at a time when your body needs to focus on healing. Do not wait until your pain has reached a level "10" or is unbearable before telling your doctor or nurse. It is much easier to control pain before it becomes severe. Following a laparoscopic procedure, pain is sometimes felt in the shoulder. This is due to the gas inserted into your abdomen during the procedure. Moving and walking helps to decrease the gas and the right shoulder pain.  Use the guide below for ways to manage your post-operative pain. Learn more by going to facs.org/safepaincontrol.  How Intense Is My Pain Common Therapies to Feel Better       I hardly notice my pain, and it does not interfere with my activities.  I notice my pain and it distracts me, but I can still do activities (sitting up, walking, standing).  Non-Medication Therapies  Ice (in a bag, applied over clothing at the surgical site), elevation, rest, meditation, massage, distraction (music, TV, play) walking and mild exercise Splinting the abdomen with pillows +  Non-Opioid Medications Acetaminophen (Tylenol) Non-steroidal anti-inflammatory drugs (NSAIDS) Aspirin, Ibuprofen (Motrin, Advil) Naproxen (Aleve) Take these as  needed, when you feel pain. Both acetaminophen and NSAIDs help to decrease pain and swelling (inflammation).      My pain is hard to ignore and is more noticeable even when I rest.  My pain interferes with my usual activities.  Non-Medication Therapies  +  Non-Opioid medications  Take on a regular schedule (around-the-clock) instead of as needed. (For example, Tylenol every 6 hours at 9:00 am, 3:00 pm, 9:00 pm, 3:00 am and Motrin every 6 hours at 12:00 am, 6:00 am, 12:00 pm, 6:00 pm)         I am focused on my pain, and I am not doing my daily activities.  I am groaning in pain, and I cannot sleep. I am unable to do anything.  My pain is as bad as it could be, and nothing else matters.  Non-Medication Therapies  +  Around-the-Clock Non-Opioid Medications  +  Short-acting opioids  Opioids should be used with other medications to manage severe pain. Opioids block pain and give a feeling of euphoria (feel high). Addiction, a serious side effect of opioids, is   rare with short-term (a few days) use.  Examples of short-acting opioids include: Tramadol (Ultram), Hydrocodone (Norco, Vicodin), Hydromorphone (Dilaudid), Oxycodone (Oxycontin)     The above directions have been adapted from the [employer] Surgical Patient Education Program.  Please refer to the ACS website if needed: [email or web address]/-/media/files/[email or web address]   Carvin Almas, MD [facility] [address], [address], [place], [place]  [address] ?  [address] [address], , [place]   [address] [phone] ? [phone] ? FAX [phone] Web site: [email or web address]  

## 2021-04-04 ENCOUNTER — Inpatient Hospital Stay: Admit: 2021-04-04 | Payer: BLUE CROSS/BLUE SHIELD | Admitting: Surgery

## 2021-04-04 ENCOUNTER — Encounter (HOSPITAL_COMMUNITY)
Admission: EM | Disposition: A | Discharge status: Home / Self Care | Payer: Self-pay | Source: Home / Self Care | Attending: Surgery

## 2021-04-04 ENCOUNTER — Inpatient Hospital Stay (HOSPITAL_COMMUNITY): Payer: Medicare Other

## 2021-04-04 ENCOUNTER — Encounter (HOSPITAL_COMMUNITY): Payer: Self-pay

## 2021-04-04 ENCOUNTER — Inpatient Hospital Stay (HOSPITAL_COMMUNITY)
Admission: EM | Admit: 2021-04-04 | Discharge: 2021-04-15 | DRG: 907 | Disposition: A | Discharge status: Home / Self Care | Payer: Medicare Other | Attending: Surgery | Admitting: Surgery

## 2021-04-04 ENCOUNTER — Inpatient Hospital Stay (HOSPITAL_COMMUNITY): Payer: Medicare Other | Admitting: Certified Registered Nurse Anesthetist

## 2021-04-04 ENCOUNTER — Other Ambulatory Visit: Payer: Self-pay

## 2021-04-04 DIAGNOSIS — Z6831 Body mass index (BMI) 31.0-31.9, adult: Secondary | ICD-10-CM | POA: Diagnosis not present

## 2021-04-04 DIAGNOSIS — Z20822 Contact with and (suspected) exposure to covid-19: Secondary | ICD-10-CM | POA: Diagnosis present

## 2021-04-04 DIAGNOSIS — K631 Perforation of intestine (nontraumatic): Secondary | ICD-10-CM | POA: Diagnosis present

## 2021-04-04 DIAGNOSIS — K9189 Other postprocedural complications and disorders of digestive system: Secondary | ICD-10-CM | POA: Diagnosis present

## 2021-04-04 DIAGNOSIS — E440 Moderate protein-calorie malnutrition: Secondary | ICD-10-CM | POA: Diagnosis present

## 2021-04-04 DIAGNOSIS — E871 Hypo-osmolality and hyponatremia: Secondary | ICD-10-CM | POA: Diagnosis present

## 2021-04-04 DIAGNOSIS — Z5331 Laparoscopic surgical procedure converted to open procedure: Secondary | ICD-10-CM

## 2021-04-04 DIAGNOSIS — J90 Pleural effusion, not elsewhere classified: Secondary | ICD-10-CM | POA: Diagnosis not present

## 2021-04-04 DIAGNOSIS — D735 Infarction of spleen: Secondary | ICD-10-CM | POA: Diagnosis not present

## 2021-04-04 DIAGNOSIS — K567 Ileus, unspecified: Secondary | ICD-10-CM | POA: Diagnosis present

## 2021-04-04 DIAGNOSIS — Z0189 Encounter for other specified special examinations: Secondary | ICD-10-CM

## 2021-04-04 DIAGNOSIS — D72829 Elevated white blood cell count, unspecified: Secondary | ICD-10-CM | POA: Diagnosis not present

## 2021-04-04 DIAGNOSIS — E663 Overweight: Secondary | ICD-10-CM | POA: Diagnosis not present

## 2021-04-04 DIAGNOSIS — K9171 Accidental puncture and laceration of a digestive system organ or structure during a digestive system procedure: Principal | ICD-10-CM | POA: Diagnosis present

## 2021-04-04 DIAGNOSIS — Z884 Allergy status to anesthetic agent status: Secondary | ICD-10-CM

## 2021-04-04 DIAGNOSIS — J189 Pneumonia, unspecified organism: Secondary | ICD-10-CM | POA: Diagnosis not present

## 2021-04-04 DIAGNOSIS — R188 Other ascites: Secondary | ICD-10-CM

## 2021-04-04 DIAGNOSIS — K56609 Unspecified intestinal obstruction, unspecified as to partial versus complete obstruction: Secondary | ICD-10-CM

## 2021-04-04 DIAGNOSIS — K658 Other peritonitis: Secondary | ICD-10-CM | POA: Diagnosis present

## 2021-04-04 DIAGNOSIS — Y838 Other surgical procedures as the cause of abnormal reaction of the patient, or of later complication, without mention of misadventure at the time of the procedure: Secondary | ICD-10-CM | POA: Diagnosis present

## 2021-04-04 DIAGNOSIS — M6289 Other specified disorders of muscle: Secondary | ICD-10-CM

## 2021-04-04 DIAGNOSIS — K432 Incisional hernia without obstruction or gangrene: Secondary | ICD-10-CM | POA: Diagnosis present

## 2021-04-04 HISTORY — PX: LAPAROSCOPY: SHX197

## 2021-04-04 LAB — CBC WITH DIFFERENTIAL/PLATELET
Abs Immature Granulocytes: 0.05 10*3/uL (ref 0.00–0.07)
Basophils Absolute: 0 10*3/uL (ref 0.0–0.1)
Basophils Relative: 0 %
Eosinophils Absolute: 0 10*3/uL (ref 0.0–0.5)
Eosinophils Relative: 0 %
HCT: 44.4 % (ref 39.0–52.0)
Hemoglobin: 14.1 g/dL (ref 13.0–17.0)
Immature Granulocytes: 0 %
Lymphocytes Relative: 7 %
Lymphs Abs: 1 10*3/uL (ref 0.7–4.0)
MCH: 26.9 pg (ref 26.0–34.0)
MCHC: 31.8 g/dL (ref 30.0–36.0)
MCV: 84.6 fL (ref 80.0–100.0)
Monocytes Absolute: 1.5 10*3/uL — ABNORMAL HIGH (ref 0.1–1.0)
Monocytes Relative: 10 %
Neutro Abs: 11.6 10*3/uL — ABNORMAL HIGH (ref 1.7–7.7)
Neutrophils Relative %: 83 %
Platelets: 306 10*3/uL (ref 150–400)
RBC: 5.25 MIL/uL (ref 4.22–5.81)
RDW: 15 % (ref 11.5–15.5)
WBC: 14.1 10*3/uL — ABNORMAL HIGH (ref 4.0–10.5)
nRBC: 0 % (ref 0.0–0.2)

## 2021-04-04 LAB — RESP PANEL BY RT-PCR (FLU A&B, COVID) ARPGX2
Influenza A by PCR: NEGATIVE
Influenza B by PCR: NEGATIVE
SARS Coronavirus 2 by RT PCR: NEGATIVE

## 2021-04-04 LAB — COMPREHENSIVE METABOLIC PANEL
ALT: 49 U/L — ABNORMAL HIGH (ref 0–44)
AST: 44 U/L — ABNORMAL HIGH (ref 15–41)
Albumin: 3.7 g/dL (ref 3.5–5.0)
Alkaline Phosphatase: 76 U/L (ref 38–126)
Anion gap: 12 (ref 5–15)
BUN: 21 mg/dL (ref 8–23)
CO2: 28 mmol/L (ref 22–32)
Calcium: 8.6 mg/dL — ABNORMAL LOW (ref 8.9–10.3)
Chloride: 93 mmol/L — ABNORMAL LOW (ref 98–111)
Creatinine, Ser: 1.03 mg/dL (ref 0.61–1.24)
GFR, Estimated: >60 mL/min (ref >60)
Glucose, Bld: 124 mg/dL — ABNORMAL HIGH (ref 70–99)
Potassium: 3.4 mmol/L — ABNORMAL LOW (ref 3.5–5.1)
Sodium: 133 mmol/L — ABNORMAL LOW (ref 135–145)
Total Bilirubin: 0.9 mg/dL (ref 0.3–1.2)
Total Protein: 7.7 g/dL (ref 6.5–8.1)

## 2021-04-04 LAB — MAGNESIUM: Magnesium: 2.6 mg/dL — ABNORMAL HIGH (ref 1.7–2.4)

## 2021-04-04 LAB — LIPASE, BLOOD: Lipase: 26 U/L (ref 11–51)

## 2021-04-04 LAB — HIV ANTIBODY (ROUTINE TESTING W REFLEX): HIV Screen 4th Generation wRfx: NONREACTIVE

## 2021-04-04 SURGERY — LAPAROSCOPY, DIAGNOSTIC
Anesthesia: General

## 2021-04-04 MED ORDER — PROPOFOL 10 MG/ML IV BOLUS
INTRAVENOUS | Status: AC
  Filled 2021-04-04: qty 20

## 2021-04-04 MED ORDER — HYDROMORPHONE HCL 1 MG/ML IJ SOLN
INTRAMUSCULAR | Status: AC
  Filled 2021-04-04: qty 1

## 2021-04-04 MED ORDER — METRONIDAZOLE 500 MG/100ML IV SOLN
500.0000 mg | Freq: Three times a day (TID) | INTRAVENOUS | Status: DC
  Administered 2021-04-04 – 2021-04-07 (×8): 500 mg via INTRAVENOUS
  Filled 2021-04-04 (×9): qty 100

## 2021-04-04 MED ORDER — BUPIVACAINE-EPINEPHRINE (PF) 0.25% -1:200000 IJ SOLN
INTRAMUSCULAR | Status: AC
  Filled 2021-04-04: qty 30

## 2021-04-04 MED ORDER — LACTATED RINGERS IV SOLN: INTRAVENOUS | Status: DC

## 2021-04-04 MED ORDER — OXYCODONE HCL 5 MG PO TABS: 10.0000 mg | ORAL_TABLET | ORAL | Status: DC | PRN

## 2021-04-04 MED ORDER — LIDOCAINE 2% (20 MG/ML) 5 ML SYRINGE
INTRAMUSCULAR | Status: DC | PRN
  Administered 2021-04-04: 80 mg via INTRAVENOUS

## 2021-04-04 MED ORDER — OXYCODONE HCL 5 MG PO TABS: 5.0000 mg | ORAL_TABLET | ORAL | Status: DC | PRN

## 2021-04-04 MED ORDER — PROPOFOL 10 MG/ML IV BOLUS
INTRAVENOUS | Status: DC | PRN
  Administered 2021-04-04: 160 mg via INTRAVENOUS

## 2021-04-04 MED ORDER — PROCHLORPERAZINE EDISYLATE 10 MG/2ML IJ SOLN: 5.0000 mg | Freq: Four times a day (QID) | INTRAMUSCULAR | Status: DC | PRN

## 2021-04-04 MED ORDER — FENTANYL CITRATE (PF) 100 MCG/2ML IJ SOLN
INTRAMUSCULAR | Status: AC
  Filled 2021-04-04: qty 2

## 2021-04-04 MED ORDER — SODIUM CHLORIDE (PF) 0.9 % IJ SOLN
INTRAMUSCULAR | Status: AC
  Filled 2021-04-04: qty 50

## 2021-04-04 MED ORDER — HYDROMORPHONE HCL 1 MG/ML IJ SOLN
0.5000 mg | INTRAMUSCULAR | Status: DC | PRN
  Administered 2021-04-04 – 2021-04-05 (×3): 0.5 mg via INTRAVENOUS
  Filled 2021-04-04 (×3): qty 0.5

## 2021-04-04 MED ORDER — 0.9 % SODIUM CHLORIDE (POUR BTL) OPTIME
TOPICAL | Status: DC | PRN
  Administered 2021-04-04 (×3): 1000 mL

## 2021-04-04 MED ORDER — FENTANYL CITRATE (PF) 250 MCG/5ML IJ SOLN
INTRAMUSCULAR | Status: AC
  Filled 2021-04-04: qty 5

## 2021-04-04 MED ORDER — ONDANSETRON 4 MG PO TBDP: 4.0000 mg | ORAL_TABLET | Freq: Four times a day (QID) | ORAL | Status: DC | PRN

## 2021-04-04 MED ORDER — CEFAZOLIN SODIUM-DEXTROSE 2-4 GM/100ML-% IV SOLN
INTRAVENOUS | Status: AC
  Filled 2021-04-04: qty 100

## 2021-04-04 MED ORDER — PHENYLEPHRINE HCL-NACL 10-0.9 MG/250ML-% IV SOLN
INTRAVENOUS | Status: DC | PRN
  Administered 2021-04-04: 40 ug/min via INTRAVENOUS

## 2021-04-04 MED ORDER — CEFAZOLIN SODIUM-DEXTROSE 2-4 GM/100ML-% IV SOLN
2.0000 g | Freq: Once | INTRAVENOUS | Status: AC
  Administered 2021-04-04: 2 g via INTRAVENOUS

## 2021-04-04 MED ORDER — DEXAMETHASONE SODIUM PHOSPHATE 10 MG/ML IJ SOLN
INTRAMUSCULAR | Status: DC | PRN
  Administered 2021-04-04: 8 mg via INTRAVENOUS

## 2021-04-04 MED ORDER — EPHEDRINE SULFATE-NACL 50-0.9 MG/10ML-% IV SOSY
PREFILLED_SYRINGE | INTRAVENOUS | Status: DC | PRN
  Administered 2021-04-04: 10 mg via INTRAVENOUS

## 2021-04-04 MED ORDER — ONDANSETRON HCL 4 MG/2ML IJ SOLN: 4.0000 mg | Freq: Four times a day (QID) | INTRAMUSCULAR | Status: DC | PRN

## 2021-04-04 MED ORDER — CHLORHEXIDINE GLUCONATE 0.12 % MT SOLN
15.0000 mL | OROMUCOSAL | Status: AC
  Administered 2021-04-04: 15 mL via OROMUCOSAL

## 2021-04-04 MED ORDER — ROCURONIUM BROMIDE 10 MG/ML (PF) SYRINGE
PREFILLED_SYRINGE | INTRAVENOUS | Status: DC | PRN
  Administered 2021-04-04: 50 mg via INTRAVENOUS

## 2021-04-04 MED ORDER — SUGAMMADEX SODIUM 200 MG/2ML IV SOLN
INTRAVENOUS | Status: DC | PRN
  Administered 2021-04-04: 200 mg via INTRAVENOUS

## 2021-04-04 MED ORDER — ENOXAPARIN SODIUM 40 MG/0.4ML IJ SOSY
40.0000 mg | PREFILLED_SYRINGE | INTRAMUSCULAR | Status: DC
  Administered 2021-04-04 – 2021-04-12 (×9): 40 mg via SUBCUTANEOUS
  Filled 2021-04-04 (×9): qty 0.4

## 2021-04-04 MED ORDER — KETOROLAC TROMETHAMINE 15 MG/ML IJ SOLN
15.0000 mg | Freq: Three times a day (TID) | INTRAMUSCULAR | Status: DC
  Administered 2021-04-04: 15 mg via INTRAVENOUS
  Filled 2021-04-04: qty 1

## 2021-04-04 MED ORDER — CEFAZOLIN SODIUM-DEXTROSE 1-4 GM/50ML-% IV SOLN
1.0000 g | Freq: Three times a day (TID) | INTRAVENOUS | Status: DC
  Administered 2021-04-05 – 2021-04-07 (×7): 1 g via INTRAVENOUS
  Filled 2021-04-04 (×9): qty 50

## 2021-04-04 MED ORDER — SODIUM CHLORIDE 0.9 % IV BOLUS
1000.0000 mL | Freq: Once | INTRAVENOUS | Status: AC
  Administered 2021-04-04: 1000 mL via INTRAVENOUS

## 2021-04-04 MED ORDER — SUCCINYLCHOLINE CHLORIDE 200 MG/10ML IV SOSY
PREFILLED_SYRINGE | INTRAVENOUS | Status: DC | PRN
  Administered 2021-04-04: 140 mg via INTRAVENOUS

## 2021-04-04 MED ORDER — IOHEXOL 300 MG/ML  SOLN
100.0000 mL | Freq: Once | INTRAMUSCULAR | Status: AC | PRN
  Administered 2021-04-04: 100 mL via INTRAVENOUS

## 2021-04-04 MED ORDER — ONDANSETRON HCL 4 MG/2ML IJ SOLN
4.0000 mg | Freq: Once | INTRAMUSCULAR | Status: AC
  Administered 2021-04-04: 4 mg via INTRAVENOUS
  Filled 2021-04-04: qty 2

## 2021-04-04 MED ORDER — FENTANYL CITRATE (PF) 100 MCG/2ML IJ SOLN
INTRAMUSCULAR | Status: DC | PRN
  Administered 2021-04-04: 50 ug via INTRAVENOUS
  Administered 2021-04-04: 100 ug via INTRAVENOUS

## 2021-04-04 MED ORDER — PROCHLORPERAZINE MALEATE 10 MG PO TABS
10.0000 mg | ORAL_TABLET | Freq: Four times a day (QID) | ORAL | Status: DC | PRN
  Filled 2021-04-04: qty 1

## 2021-04-04 MED ORDER — ACETAMINOPHEN 325 MG PO TABS
650.0000 mg | ORAL_TABLET | Freq: Four times a day (QID) | ORAL | Status: DC
  Administered 2021-04-06 – 2021-04-08 (×3): 650 mg via ORAL
  Filled 2021-04-04 (×3): qty 2

## 2021-04-04 MED ORDER — HYDROMORPHONE HCL 1 MG/ML IJ SOLN
0.2500 mg | INTRAMUSCULAR | Status: DC | PRN
  Administered 2021-04-04 (×4): 0.5 mg via INTRAVENOUS

## 2021-04-04 MED ORDER — BUPIVACAINE-EPINEPHRINE 0.25% -1:200000 IJ SOLN
INTRAMUSCULAR | Status: DC | PRN
  Administered 2021-04-04: 1 mL

## 2021-04-04 MED ORDER — ONDANSETRON HCL 4 MG/2ML IJ SOLN
INTRAMUSCULAR | Status: DC | PRN
  Administered 2021-04-04: 4 mg via INTRAVENOUS

## 2021-04-04 MED ORDER — FENTANYL CITRATE (PF) 100 MCG/2ML IJ SOLN
25.0000 ug | INTRAMUSCULAR | Status: DC | PRN
  Administered 2021-04-04 (×2): 50 ug via INTRAVENOUS

## 2021-04-04 SURGICAL SUPPLY — 73 items
ADH SKN CLS APL DERMABOND .7 (GAUZE/BANDAGES/DRESSINGS)
APL PRP STRL LF DISP 70% ISPRP (MISCELLANEOUS) ×1
APPLIER CLIP 5 13 M/L LIGAMAX5 (MISCELLANEOUS)
APPLIER CLIP ROT 10 11.4 M/L (STAPLE)
APR CLP MED LRG 11.4X10 (STAPLE)
APR CLP MED LRG 5 ANG JAW (MISCELLANEOUS)
BAG COUNTER SPONGE SURGICOUNT (BAG) IMPLANT
BAG SPNG CNTER NS LX DISP (BAG)
BLADE EXTENDED COATED 6.5IN (ELECTRODE) IMPLANT
BLADE SURG SZ10 CARB STEEL (BLADE) IMPLANT
CELLS DAT CNTRL 66122 CELL SVR (MISCELLANEOUS) IMPLANT
CHLORAPREP W/TINT 26 (MISCELLANEOUS) ×2 IMPLANT
CLIP APPLIE 5 13 M/L LIGAMAX5 (MISCELLANEOUS) IMPLANT
CLIP APPLIE ROT 10 11.4 M/L (STAPLE) IMPLANT
COVER MAYO STAND STRL (DRAPES) IMPLANT
COVER SURGICAL LIGHT HANDLE (MISCELLANEOUS) ×2 IMPLANT
DECANTER SPIKE VIAL GLASS SM (MISCELLANEOUS) ×2 IMPLANT
DERMABOND ADVANCED (GAUZE/BANDAGES/DRESSINGS)
DERMABOND ADVANCED .7 DNX12 (GAUZE/BANDAGES/DRESSINGS) IMPLANT
DRAIN CHANNEL 19F RND (DRAIN) ×1 IMPLANT
DRAPE SHEET LG 3/4 BI-LAMINATE (DRAPES) IMPLANT
DRAPE WARM FLUID 44X44 (DRAPES) IMPLANT
ELECT PENCIL ROCKER SW 15FT (MISCELLANEOUS) ×1 IMPLANT
ELECT REM PT RETURN 15FT ADLT (MISCELLANEOUS) ×2 IMPLANT
EVACUATOR SILICONE 100CC (DRAIN) ×1 IMPLANT
GAUZE SPONGE 4X4 12PLY STRL (GAUZE/BANDAGES/DRESSINGS) ×2 IMPLANT
GLOVE SURG ENC TEXT LTX SZ7.5 (GLOVE) ×2 IMPLANT
GLOVE SURG MICRO LTX SZ6 (GLOVE) ×2 IMPLANT
GLOVE SURG UNDER LTX SZ8 (GLOVE) ×2 IMPLANT
GOWN STRL REUS W/TWL XL LVL3 (GOWN DISPOSABLE) ×8 IMPLANT
HANDLE SUCTION POOLE (INSTRUMENTS) IMPLANT
IRRIG SUCT STRYKERFLOW 2 WTIP (MISCELLANEOUS)
IRRIGATION SUCT STRKRFLW 2 WTP (MISCELLANEOUS) IMPLANT
KIT BASIN OR (CUSTOM PROCEDURE TRAY) ×2 IMPLANT
KIT TURNOVER KIT A (KITS) ×2 IMPLANT
LEGGING LITHOTOMY PAIR STRL (DRAPES) IMPLANT
MESH PHASIX ST 15X20 (Mesh General) ×1 IMPLANT
MESH VICRYL KNITTED 12X12 (Mesh General) ×1 IMPLANT
RELOAD PROXIMATE 75MM BLUE (ENDOMECHANICALS) ×2 IMPLANT
RELOAD STAPLE 75 3.8 BLU REG (ENDOMECHANICALS) IMPLANT
RETRACTOR WND ALEXIS 18 MED (MISCELLANEOUS) IMPLANT
RTRCTR WOUND ALEXIS 18CM MED (MISCELLANEOUS)
SCISSORS LAP 5X35 DISP (ENDOMECHANICALS) ×2 IMPLANT
SHEARS HARMONIC ACE PLUS 36CM (ENDOMECHANICALS) IMPLANT
SLEEVE XCEL OPT CAN 5 100 (ENDOMECHANICALS) ×2 IMPLANT
SPONGE T-LAP 18X18 ~~LOC~~+RFID (SPONGE) IMPLANT
STAPLER PROXIMATE 75MM BLUE (STAPLE) ×1 IMPLANT
STAPLER VISISTAT 35W (STAPLE) ×1 IMPLANT
STRIP CLOSURE SKIN 1/2X4 (GAUZE/BANDAGES/DRESSINGS) IMPLANT
SUCTION POOLE HANDLE (INSTRUMENTS) ×2
SUT ETHILON 3 0 PS 1 (SUTURE) ×1 IMPLANT
SUT PDS AB 1 TP1 96 (SUTURE) IMPLANT
SUT PDS AB 2-0 CT2 27 (SUTURE) ×2 IMPLANT
SUT PROLENE 2 0 KS (SUTURE) IMPLANT
SUT PROLENE 2 0 SH DA (SUTURE) IMPLANT
SUT SILK 2 0 (SUTURE)
SUT SILK 2 0 SH CR/8 (SUTURE) IMPLANT
SUT SILK 2-0 18XBRD TIE 12 (SUTURE) IMPLANT
SUT SILK 3 0 (SUTURE)
SUT SILK 3 0 SH CR/8 (SUTURE) IMPLANT
SUT SILK 3-0 18XBRD TIE 12 (SUTURE) IMPLANT
SUT VIC AB 2-0 CT1 36 (SUTURE) ×2 IMPLANT
SUT VIC AB 2-0 SH 18 (SUTURE) ×1 IMPLANT
SYR BULB IRRIG 60ML STRL (SYRINGE) IMPLANT
SYS LAPSCP GELPORT 120MM (MISCELLANEOUS)
SYSTEM LAPSCP GELPORT 120MM (MISCELLANEOUS) IMPLANT
TOWEL OR 17X26 10 PK STRL BLUE (TOWEL DISPOSABLE) ×2 IMPLANT
TOWEL OR NON WOVEN STRL DISP B (DISPOSABLE) ×2 IMPLANT
TRAY FOLEY MTR SLVR 16FR STAT (SET/KITS/TRAYS/PACK) ×2 IMPLANT
TRAY LAPAROSCOPIC (CUSTOM PROCEDURE TRAY) ×2 IMPLANT
TROCAR BLADELESS OPT 5 100 (ENDOMECHANICALS) ×2 IMPLANT
TROCAR XCEL NON-BLD 11X100MML (ENDOMECHANICALS) IMPLANT
YANKAUER SUCT BULB TIP NO VENT (SUCTIONS) ×2 IMPLANT

## 2021-04-04 NOTE — Op Note (Signed)
Patient: Brian Atkinson ([birth year], [id number])  Date of Surgery: 04/04/2021   Preoperative Diagnosis: SMALL BOWEL OBSTRUCTION SECONDARY TO INTRAPARIETAL HERNIA  Postoperative Diagnosis: SMALL BOWEL OBSTRUCTION SECONDARY TO INTRAPARIETAL HERNIA WITH SMALL BOWEL PERFORATION  Surgical Procedure:  Diagnostic laparoscopy Reopening of recent laparotomy Explantation of retromuscular ultrapro mesh Retromuscular recurrent incisional hernia repair with vicryl mesh reconstruction of the posterior layer and Phasix absorbable retromuscular mesh  Operative Team Members:  Surgeon(s) and Role:    * Emile Ringgenberg, Hyman Hopes, MD - Primary   Anesthesiologist: Dorris Singh, MD CRNA: Basilio Cairo, CRNA; Epimenio Sarin, CRNA   Anesthesia: General   Fluids:  No intake/output data recorded.  Complications: none  Drains:  (19 Fr) Jackson-Pratt drain(s) with closed bulb suction in the retromuscular space atop the mesh    Specimen:  ID Type Source Tests Collected by Time Destination  1 : small bowel Tissue PATH GI benign resection SURGICAL PATHOLOGY Brian Atkinson, Hyman Hopes, MD 04/04/2021 1831      Disposition:  PACU - hemodynamically stable.  Plan of Care: Admit to inpatient     Indications for Procedure: Brian Atkinson is a 70 y.o. male who presented with a intraparietal hernia 7 days after open explantation of intraperitoneal mesh and Rives Stoppa recurrent incisional hernia repair.  I recommended diagnostic laparoscopy, possible exploratory laparotomy with repair of the intraparietal hernia.    The procedure itself as well as its risks, benefits and alternatives were discussed.  The risks discussed included but were not limited to the risk of infection, bleeding, damage to nearby structures.  After a full discussion and all questions answered the patient granted consent to proceed.  Findings: Intraparietal hernia with large amount of small bowel in the retromuscular space.   Small bowel injury - small bowel lacerated by PDS closure of posterior layer of Rives Stoppa hernia repair. This was resected and reanastamosed.  There was frank spillage of small bowel contents during the case.    Infection status: Patient: Brian Atkinson Emergency General Surgery Service Patient Case: Emergent Infection Present At Time Of Surgery (PATOS): Feculent Peritonitis   Description of Procedure:   On the date stated above the patient was taken to the operating room suite and placed in supine position.  General endotracheal anesthesia was induced.  Ancef was given prior to the case start.  The patient's abdomen was prepped and draped in usual sterile fashion.  A timeout was completed verifying the correct patient, procedure, positioning, and equipment needed for the case.  I began by entering the abdomen via an optical entry in the left upper quadrant.  The abdomen was insufflated to 15 mmHg.  I inspected the abdomen.  An additional 5 mm trocar was placed in the left lower quadrant.  As suspected identified a defect in the posterior layer of the Rives-Stoppa hernia repair.  This contained some omentum which was easily wiped out of the hernia and small intestine.  As I inspected the small intestine identified a hole had been created in the inferior aspect of the hernia defect where a PDS strand was bridging the defect and had lacerated the bowel.  I suspect this occurred with insufflation of the abdomen, but it could have occurred preoperatively.  Decision was made this point to transition to open surgery.  The previous midline laparotomy incision was opened removing the Monocryl Vicryl and PDS sutures which should close the skin and subcutaneous tissues and anterior fascia.  The previously placed ultra Pro mesh was removed from  the wound and discarded.  The small intestine was eviscerated from the wound.  It was run from normal dilated intestine proximally to normal decompressed intestine distally.   The enterotomy that had been identified laparoscopically was identified.  The laceration appeared to involve more than 50% of the intestine so I decided to perform an anastomosis using this is by common enterotomy.  The 75 mm GIA linear stapler was used to create a common anastomosis between these 2 limbs of intestine along the antimesenteric border of the small bowel.  The injury and common enterotomy was then elevated and stapled off using the 75 mm GIA linear stapler.  The staple line was imbricated using 2-0 Vicryl sutures.  A crotch stitch of 2-0 Vicryl suture was placed.  The bowel was then returned to the abdomen and a lap was placed to hold the viscera below the posterior layer of the hernia repair.  The abdomen was then copiously irrigated and suctioned clean with multiple liters of warm saline.  I inspected the posterior layer and it appears the defect occurred where the previous mesh had been removed.  A piece of Vicryl suture was used to reconstruct the posterior layer.  This was sewn in using Vicryl suture.  The lap was removed before tying the final sutures to sew this mesh.  The Vicryl mesh lay comfortably and completed the posterior layer, closing the intraparietal hernia defect.  The retromuscular space was then irrigated with multiple liters of normal saline.  We allowed the saline to dwell in the retrovascular space for a few minutes before suctioned clean.  Due to the gross contamination of the field, decision was made to complete the hernia repair with phasix's mesh.  A piece of phasic mesh was cut to size and placed in the retrorectus muscular space.  A 19 round channel drain was placed in the retromuscular space and brought out through the left lower quadrant port site.  The midline fascia was then closed using running 2-0 PDS.  The skin was closed with Vicryl for the deep dermal layer and staples over top of a subcutaneous Penrose drain.  The port site was stapled closed and the JP drain was  sutured to the skin using a drain stitch.  All sponge and needle counts were correct at the end of this case.  A sterile dressing was applied.  The patient was transferred the postanesthesia care unit in stable condition.   Ivar Drape, MD General, Bariatric, & Minimally Invasive Surgery [facility], [place]

## 2021-04-04 NOTE — ED Provider Notes (Signed)
[facility]-EMERGENCY DEPT Provider Note   CSN: [id number] Arrival date & time: 04/04/21  1052     History Chief Complaint  Patient presents with   Constipation    Brian Atkinson is a 70 y.o. male history of recurrent ventral hernia surgery, repaired on 03/28/21 with Dr Dossie Der (mesh repair, open surgery), presented ED with nausea and vomiting.  He reports that he has difficulty with bowel movement has not had 1 since his surgery.  He is also not passing gas.  He feels bloated.  He reports repeated episodes of vomiting at home.  He feels he is getting dehydrated.  He did speak to the surgeon over the weekend and was told to come to the ED for a CT scan as he may have a bowel obstruction.  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   Acute blood loss anemia 07/28/2020   Closed bicondylar fracture of left tibial plateau 07/23/2020   History of MRSA infection left patella  09/26/2017   MRSA (methicillin resistant Staphylococcus aureus)    Unspecified constipation 06/20/2014   Iron deficiency anemia 06/18/2014    Past Surgical History:  Procedure Laterality Date   BONE EXCISION Left 09/25/2017   Procedure: PARTIAL EXCISION OF LEFT PATELLA;  Surgeon: Myrene Galas, MD;  Location: [facility] OR;  Service: Orthopedics;  Laterality: Left;   CATARACT EXTRACTION W/ INTRAOCULAR LENS  IMPLANT, BILATERAL     COLONOSCOPY     DEBRIDEMENT AND CLOSURE WOUND Left 06/19/2014   Procedure: DEBRIDEMENT AND CLOSURE LEFT KNEE;  Surgeon:  Kathryne Hitch, MD;  Location: [facility] ORS;  Service: Orthopedics;  Laterality: Left;   EXTERNAL FIXATION LEG Left 07/23/2020   Procedure: EXTERNAL FIXATION LEG;  Surgeon: Yolonda Kida, MD;  Location: [facility] OR;  Service: Orthopedics;  Laterality: Left;   EXTERNAL FIXATION LEG Left 07/27/2020   Procedure: Adjustment of external fixator left leg;  Surgeon: Myrene Galas, MD;  Location: [facility] OR;  Service: Orthopedics;  Laterality: Left;   EXTERNAL FIXATION REMOVAL Left 08/12/2020   Procedure: REMOVAL EXTERNAL FIXATION LEG;  Surgeon: Myrene Galas, MD;  Location: [facility] OR;  Service: Orthopedics;  Laterality: Left;   FOREIGN BODY REMOVAL N/A 03/28/2021   Procedure: OPEN EXCISION OF INTRABDOMINAL MESH;  Surgeon: Quentin Ore, MD;  Location: [facility] ORS;  Service: General;  Laterality: N/A;   IRRIGATION AND DEBRIDEMENT KNEE Left 06/17/2014   Procedure: IRRIGATION AND DEBRIDEMENT KNEE;  Surgeon: Budd Palmer, MD;  Location: [facility]S;  Service: Orthopedics;  Laterality: Left;   KNEE BURSECTOMY Left 05/27/2015   KNEE BURSECTOMY Left 05/27/2015   Procedure: KNEE BURSECTOMY, suture removal patellar tendon;  Surgeon: Myrene Galas, MD;  Location: [facility] OR;  Service: Orthopedics;  Laterality: Left;   ORIF PATELLA Left 06/12/2014   Procedure: OPEN REDUCTION INTERNAL (ORIF) FIXATION PATELLA;  Surgeon: Budd Palmer, MD;  Location: [facility] OR;  Service: Orthopedics;  Laterality: Left;   ORIF TIBIA PLATEAU Left 08/12/2020   ORIF TIBIA PLATEAU Left  08/12/2020   Procedure: OPEN REDUCTION INTERNAL FIXATION (ORIF) BICONDYLAR TIBIAL PLATEAU;  Surgeon: Myrene Galas, MD;  Location: [facility] OR;  Service: Orthopedics;  Laterality: Left;   ORIF TIBIA PLATEAU Left 12/14/2020   Procedure: TIBIA REPAIR USING ILLIAC CREST BONE GRAFT VERSUS REAMED INTRAMEDULLARY ASPIRATE;  Surgeon: Myrene Galas, MD;  Location: [facility] OR;  Service: Orthopedics;  Laterality: Left;   REFRACTIVE SURGERY Bilateral    TONSILLECTOMY     VENTRAL HERNIA REPAIR  N/A 03/28/2021   Procedure: REPAIR OF RECURRENT VENTRAL HERNIA WITH MESH;  Surgeon: Stechschulte, Hyman Hopes, MD;  Location: [facility] ORS;  Service: General;  Laterality: N/A;       History reviewed. No pertinent family history.  Social History   Tobacco Use   Smoking status: Never   Smokeless tobacco: Never  Vaping Use   Vaping Use: Never used  Substance Use Topics   Alcohol use: Not Currently   Drug use: No    Home Medications Prior to Admission medications   Medication Sig Start Date End Date Taking? Authorizing Provider  oxyCODONE-acetaminophen (PERCOCET) 5-325 MG tablet Take 1-2 tablets by mouth every 4 (four) hours as needed for severe pain. 03/29/21 03/29/22 Yes Stechschulte, Hyman Hopes, MD  acetaminophen (TYLENOL) 500 MG tablet TAKE 1 TABLET (500 MG TOTAL) BY MOUTH EVERY TWELVE HOURS. 12/17/20 12/17/21  Montez Morita, PA-C  cetirizine (ZYRTEC) 10 MG tablet Take 10 mg by mouth daily as needed for allergies.    [provider]  Diclofenac Sodium CR 100 MG 24 hr tablet Take 1 tablet (100 mg total) by mouth daily. 02/27/21   Palumbo, April, MD  docusate sodium (COLACE) 100 MG capsule TAKE 1 CAPSULE (100 MG TOTAL) BY MOUTH TWO TIMES DAILY. Patient not taking: No sig reported 12/17/20 12/17/21  Montez Morita, PA-C  methocarbamol (ROBAXIN) 500 MG tablet TAKE 1-2 TABLETS (500-1,000 MG TOTAL) BY MOUTH EVERY SIX HOURS AS NEEDED FOR MUSCLE SPASMS. Patient not taking: No sig reported 12/17/20 12/17/21  Montez Morita, PA-C  Multiple Vitamins-Minerals (ONE-A-DAY MENS 50+) TABS Take 1 tablet by mouth daily.    [provider]  naloxone (NARCAN) nasal spray 4 mg/0.1 mL USE AS DIRECTED IN EITHER NOSTRIL AT SIGNS OF OPIOD OVERDOSE. MAY REPEAT IN OPPOSITE NOSTRIL WITH NEW SPRAY IN 2-3 MINUTES IF MINIMAL RESPONSE Patient not taking: No sig reported 12/17/20 12/17/21  Montez Morita, PA-C  oxyCODONE-acetaminophen (PERCOCET) 10-325 MG tablet TAKE 1-2 TABLETS BY MOUTH EVERY SIX HOURS AS NEEDED FOR PAIN. Patient  not taking: No sig reported 12/17/20 06/15/21  Montez Morita, PA-C  rivaroxaban (XARELTO) 10 MG TABS tablet TAKE 1 TABLET (10 MG TOTAL) BY MOUTH DAILY. Patient not taking: No sig reported 12/17/20 12/17/21  Montez Morita, PA-C  vitamin C (ASCORBIC ACID) 500 MG tablet TAKE 1 TABLET (1,000 MG TOTAL) BY MOUTH DAILY. Patient not taking: No sig reported 07/28/20 07/28/21  Montez Morita, PA-C  Vitamin D3 (VITAMIN D) 25 MCG tablet TAKE 2 TABLETS (2,000 UNITS TOTAL) BY MOUTH DAILY. Patient not taking: No sig reported 08/16/20 08/16/21  Montez Morita, PA-C  enoxaparin (LOVENOX) 40 MG/0.4ML injection Inject 0.4 mLs (40 mg total) into the skin daily. 07/28/20 08/16/20  Montez Morita, PA-C  ferrous sulfate 325 (65 FE) MG tablet Take 1 tablet (325 mg total) by mouth 3 (three) times daily with meals. Patient not taking: Reported on 12/09/2020 07/28/20 12/17/20  Montez Morita, PA-C    Allergies    Versed [midazolam]  Review of Systems   Review of Systems  Constitutional:  Negative  for chills and fever.  Respiratory:  Negative for cough and shortness of breath.   Cardiovascular:  Negative for chest pain and palpitations.  Gastrointestinal:  Positive for abdominal distention, abdominal pain, nausea and vomiting.  Genitourinary:  Negative for dysuria and hematuria.  Musculoskeletal:  Negative for arthralgias and back pain.  Skin:  Negative for color change and rash.  Neurological:  Negative for syncope and headaches.  All other systems reviewed and are negative.  Physical Exam Updated Vital Signs BP 129/89   Pulse 89   Temp 98.1 F (36.7 C) (Oral)   Resp 20   Ht 5' 11.5" (1.816 m)   Wt 103 kg   SpO2 95%   BMI 31.23 kg/m   Physical Exam Constitutional:      General: He is not in acute distress. HENT:     Head: Normocephalic and atraumatic.  Eyes:     Conjunctiva/sclera: Conjunctivae normal.     Pupils: Pupils are equal, round, and reactive to light.  Cardiovascular:     Rate and Rhythm: Normal rate and  regular rhythm.  Pulmonary:     Effort: Pulmonary effort is normal. No respiratory distress.  Abdominal:     General: There is distension.     Tenderness: There is no abdominal tenderness. There is no guarding.     Comments: Ventral incision appears clean, intact  Skin:    General: Skin is warm and dry.  Neurological:     General: No focal deficit present.     Mental Status: He is alert. Mental status is at baseline.  Psychiatric:        Mood and Affect: Mood normal.        Behavior: Behavior normal.    ED Results / Procedures / Treatments   Labs (all labs ordered are listed, but only abnormal results are displayed) Labs Reviewed  COMPREHENSIVE METABOLIC PANEL - Abnormal; Notable for the following components:      Result Value   Sodium 133 (*)    Potassium 3.4 (*)    Chloride 93 (*)    Glucose, Bld 124 (*)    Calcium 8.6 (*)    AST 44 (*)    ALT 49 (*)    All other components within normal limits  CBC WITH DIFFERENTIAL/PLATELET - Abnormal; Notable for the following components:   WBC 14.1 (*)    Neutro Abs 11.6 (*)    Monocytes Absolute 1.5 (*)    All other components within normal limits  MAGNESIUM - Abnormal; Notable for the following components:   Magnesium 2.6 (*)    All other components within normal limits  RESP PANEL BY RT-PCR (FLU A&B, COVID) ARPGX2  LIPASE, BLOOD  URINALYSIS, ROUTINE W REFLEX MICROSCOPIC  HIV ANTIBODY (ROUTINE TESTING W REFLEX)  BASIC METABOLIC PANEL  CBC  SURGICAL PATHOLOGY    EKG None  Radiology CT ABDOMEN PELVIS W CONTRAST  Result Date: 04/04/2021 CLINICAL DATA:  Recent ventral hernia repair.  Abdominal pain EXAM: CT ABDOMEN AND PELVIS WITH CONTRAST TECHNIQUE: Multidetector CT imaging of the abdomen and pelvis was performed using the standard protocol following bolus administration of intravenous contrast. CONTRAST:  100mL OMNIPAQUE IOHEXOL 300 MG/ML  SOLN COMPARISON:  Feb 26, 2021 FINDINGS: Lower chest: There is patchy airspace  opacity in the inferior lingula. Areas of atelectatic change in each lower lobe noted. There is a small hiatal hernia. Hepatobiliary: There are multiple liver cysts with the largest cyst in the anterior segment right lobe of the liver measuring  2.9 x 2.2 cm, stable. No new liver lesions are evident. Gallbladder wall is not appreciably thickened. There is no biliary duct dilatation. Pancreas: No pancreatic mass or inflammatory focus. Spleen: No splenic lesions are evident. Adrenals/Urinary Tract: Adrenals bilaterally appear normal. There is a 7 mm cyst in the anterior mid left kidney. There is an apparent parapelvic cyst on the left measuring 1 x 1 cm. There is no appreciable hydronephrosis on either side. There is no renal or ureteral calculus on either side. Urinary bladder is midline with wall thickness within normal limits. Stomach/Bowel: There is dilated fluid-filled proximal and mid small bowel. There is a transition zone in the region of the distal jejunum consistent with a degree of small-bowel obstruction. No appreciable pneumoperitoneum or portal venous air evident. Note that the terminal ileal region appears unremarkable. The appendix is not appreciable on this examination. There is no periappendiceal region inflammation evident. Vascular/Lymphatic: No abdominal aortic aneurysm. There are scattered foci of aortic and iliac artery atherosclerotic arterial vascular calcification. Major venous structures appear patent. No evident adenopathy in the abdomen or pelvis. Reproductive: Prostate and seminal vesicles are normal in size and contour. Other: Postoperative changes noted in the midline upper abdomen slightly superior to the umbilicus consistent with recent ventral hernia repair. Postoperative air and fluid is noted in this area. Mild fluid noted in this area consistent with recent surgery. It should be noted that early abscess in this area cannot be entirely excluded by CT. Mild air in the abdominal wall  slightly to the right of midline in this area is felt to be of postoperative etiology. There is fat in each inguinal ring. There is no abscess in the peritoneum or retroperitoneum of the abdomen pelvis. No appreciable ascites in the abdomen or pelvis. Musculoskeletal: No blastic or lytic bone lesions are appreciable. There is degenerative change in lumbar spine. There is spinal stenosis at L4-5 due to bony hypertrophy and disc protrusion. No intramuscular lesions are evident. IMPRESSION: 1. Evidence of small bowel obstruction with apparent transition zone in the distal jejunal region. No free air evident. No abscess in the peritoneum or retroperitoneum of the abdomen and pelvis. 2. Postoperative change in the anterior abdominal wall region consistent with recent ventral hernia repair. Small amount of air in fluid at the site of hernia repair. It is possible that there is early developing abscess in this area with fluid and air collection measuring 2.5 x 1.5 cm. Close clinical surveillance of this area is felt to be warranted. 3. Apparent pneumonia inferior lingula. Areas of atelectatic change in each lower lobe noted as well. 4. 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 5.  Aortic Atherosclerosis (ICD10-I70.0). 6.  Small hiatal hernia.  Fat noted in each inguinal ring. Electronically Signed   By: Bretta Bang III M.D.   On: 04/04/2021 15:08    Procedures Procedures   Medications Ordered in ED Medications  enoxaparin (LOVENOX) injection 40 mg ( Subcutaneous Automatically Held 04/12/21 2200)  ondansetron (ZOFRAN-ODT) disintegrating tablet 4 mg ( Oral MAR Hold 04/04/21 1650)    Or  ondansetron (ZOFRAN) injection 4 mg ( Intravenous MAR Hold 04/04/21 1650)  prochlorperazine (COMPAZINE) tablet 10 mg ( Oral MAR Hold 04/04/21 1650)    Or  prochlorperazine (COMPAZINE) injection 5-10 mg ( Intravenous MAR Hold 04/04/21 1650)  HYDROmorphone (DILAUDID) injection 0.5 mg ( Intravenous MAR  Hold 04/04/21 1650)  oxyCODONE (Oxy IR/ROXICODONE) immediate release tablet 5 mg ( Oral MAR Hold 04/04/21 1650)  oxyCODONE (Oxy IR/ROXICODONE) immediate release tablet 10 mg ( Oral MAR Hold 04/04/21 1650)  ketorolac (TORADOL) 15 MG/ML injection 15 mg ( Intravenous Automatically Held 04/09/21 0600)  acetaminophen (TYLENOL) tablet 650 mg ( Oral Automatically Held 04/12/21 1800)  sodium chloride (PF) 0.9 % injection (has no administration in time range)  lactated ringers infusion ( Intravenous Continued from Pre-op 04/04/21 1753)  HYDROmorphone (DILAUDID) injection 0.25-0.5 mg (has no administration in time range)  0.9 % irrigation (POUR BTL) (1,000 mLs Irrigation Given 04/04/21 1843)  sodium chloride 0.9 % bolus 1,000 mL (1,000 mLs Intravenous New Bag/Given 04/04/21 1234)  ondansetron (ZOFRAN) injection 4 mg (4 mg Intravenous Given 04/04/21 1235)  iohexol (OMNIPAQUE) 300 MG/ML solution 100 mL (100 mLs Intravenous Contrast Given 04/04/21 1417)  chlorhexidine (PERIDEX) 0.12 % solution 15 mL (15 mLs Mouth/Throat Given 04/04/21 1702)  ceFAZolin (ANCEF) 2-4 GM/100ML-% IVPB (  Override pull for Anesthesia 04/04/21 1818)  ceFAZolin (ANCEF) IVPB 2g/100 mL premix (2 g Intravenous Given 04/04/21 1805)    ED Course  I have reviewed the triage vital signs and the nursing notes.  Pertinent labs & imaging results that were available during my care of the patient were reviewed by me and considered in my medical decision making (see chart for details).  This patient complains of abdominal distension, nausea, post operatively.  This involves an extensive number of treatment options, and is a complaint that carries with it a high risk of complications and morbidity.  The differential diagnosis includes bowel obstruction most likely versus ileus versus other.  I ordered, reviewed, and interpreted labs, showing leukocytosis.   I ordered medication IV fluids, IV zofran for dehydration and nausea I ordered imaging studies which  included CT abdomen, imaging reviewed below  After the interventions stated above, I reevaluated the patient and found vitals stable.  Doubt sepsis at this time.  Patient admitted by surgical service for further management.   Clinical Course as of 04/04/21 1900  Mon Apr 04, 2021  1538 CT consistent with SBO.  Patient was admitted by surgical service [MT]    Clinical Course User Index [MT] Kimba Lottes, Kermit Balo, MD    Final Clinical Impression(s) / ED Diagnoses Final diagnoses:  SBO (small bowel obstruction) [facility])    Rx / DC Orders ED Discharge Orders     None        Terald Sleeper, MD 04/04/21 1900

## 2021-04-04 NOTE — Progress Notes (Signed)
CT reviewed, demonstrates intraparietal hernia.  Recommend diagnostic laparoscopy with repair of posterior layer of Rives Stoppa hernia repair, possible exploratory laparotomy.  The procedure itself as well as the risks, benefits and alternatives were discussed with the patient who granted consent to proceed.  We will proceed to the OR emergently tonight.  Quentin Ore, MD General, Bariatric and Minimally Invasive Surgery [facility], [place]

## 2021-04-04 NOTE — Anesthesia Postprocedure Evaluation (Signed)
Anesthesia Post Note  Patient: Brian Atkinson  Procedure(s) Performed: Diagnostic laparotomy, exploratory laparotomy, small bowel resection with mesh     Patient location during evaluation: PACU Anesthesia Type: General Level of consciousness: awake Pain management: pain level controlled Vital Signs Assessment: post-procedure vital signs reviewed and stable Respiratory status: spontaneous breathing Cardiovascular status: stable Postop Assessment: no apparent nausea or vomiting Anesthetic complications: no   No notable events documented.  Last Vitals:  Vitals:   04/04/21 2059 04/04/21 2100  BP:  133/82  Pulse: 85 84  Resp: 14 11  Temp:  36.6 C  SpO2: 94% 95%    Last Pain:  Vitals:   04/04/21 2059  TempSrc:   PainSc: 10-Worst pain ever                 Sundiata Ferrick

## 2021-04-04 NOTE — Anesthesia Procedure Notes (Signed)
Procedure Name: Intubation Date/Time: 04/04/2021 6:04 PM Performed by: Montel Clock, CRNA Pre-anesthesia Checklist: Patient identified, Emergency Drugs available, Suction available, Patient being monitored and Timeout performed Patient Re-evaluated:Patient Re-evaluated prior to induction Oxygen Delivery Method: Circle system utilized Preoxygenation: Pre-oxygenation with 100% oxygen Induction Type: IV induction, Rapid sequence and Cricoid Pressure applied Laryngoscope Size: Mac and 3 Grade View: Grade I Tube type: Oral Tube size: 7.5 mm Number of attempts: 1 Airway Equipment and Method: Stylet Placement Confirmation: ETT inserted through vocal cords under direct vision, positive ETCO2 and breath sounds checked- equal and bilateral Secured at: 23 cm Tube secured with: Tape Dental Injury: Teeth and Oropharynx as per pre-operative assessment

## 2021-04-04 NOTE — Plan of Care (Signed)
  Problem: Clinical Measurements: Goal: Will remain free from infection Outcome: Progressing   Problem: Pain Managment: Goal: General experience of comfort will improve Outcome: Progressing   Problem: Safety: Goal: Ability to remain free from injury will improve Outcome: Progressing   

## 2021-04-04 NOTE — ED Triage Notes (Signed)
Patient states he had an umbilical hernia repair on 03/28/21.  Patient reports vomiting that started 5 days ago. Patient states his vomitus is brown and urine is dark brown. Patient states no BM in 9 days. Patient states he has taken laxatives, fleets enema and a regular enema with no results.

## 2021-04-04 NOTE — Transfer of Care (Signed)
Immediate Anesthesia Transfer of Care Note  Patient: Brian Atkinson  Procedure(s) Performed: Procedure(s): Diagnostic laparotomy, exploratory laparotomy, small bowel resection with mesh (N/A)  Patient Location: PACU  Anesthesia Type:General  Level of Consciousness: Alert, Awake, Oriented  Airway & Oxygen Therapy: Patient Spontanous Breathing  Post-op Assessment: Report given to RN  Post vital signs: Reviewed and stable  Last Vitals:  Vitals:   04/04/21 1600 04/04/21 1653  BP: 124/90 129/89  Pulse: 91 89  Resp:  20  Temp:  36.7 C  SpO2: 94% 95%    Complications: No apparent anesthesia complications

## 2021-04-04 NOTE — Anesthesia Preprocedure Evaluation (Addendum)
Anesthesia Evaluation  Patient identified by MRN, date of birth, ID band Patient awake    Reviewed: Allergy & Precautions, NPO status , Patient's Chart, lab work & pertinent test results  Airway Mallampati: II  TM Distance: >3 FB     Dental   Pulmonary neg pulmonary ROS,    breath sounds clear to auscultation       Cardiovascular negative cardio ROS   Rhythm:Regular Rate:Normal     Neuro/Psych negative neurological ROS     GI/Hepatic Neg liver ROS, History noted Dr. Chilton Si   Endo/Other    Renal/GU negative Renal ROS     Musculoskeletal   Abdominal   Peds  Hematology negative hematology ROS (+) anemia ,   Anesthesia Other Findings   Reproductive/Obstetrics                             Anesthesia Physical Anesthesia Plan  ASA: 3  Anesthesia Plan: General   Post-op Pain Management:    Induction: Intravenous and Rapid sequence  PONV Risk Score and Plan: 2 and Ondansetron and Dexamethasone  Airway Management Planned: Oral ETT  Additional Equipment:   Intra-op Plan:   Post-operative Plan: Possible Post-op intubation/ventilation  Informed Consent:     Dental advisory given  Plan Discussed with: CRNA and Anesthesiologist  Anesthesia Plan Comments:         Anesthesia Quick Evaluation

## 2021-04-04 NOTE — H&P (Signed)
Admitting Physician: Hyman Hopes Hartley Urton  Service: General Surgery  CC: Nausea and vomiting after hernia repair.  Subjective   HPI: Brian Atkinson is an 70 y.o. male who is here for nausea, vomiting and abdominal pain after ventral hernia repair.  He tried an aggressive bowel regimen at home over the weekend but was unable to have bowel movement.  He had a bowel movement in the hospital before he left but has not had one at home.  He has had flatus.  Past Medical History:  Diagnosis Date   Acute osteomyelitis of patella (HCC)    Complication of anesthesia    post-op memory problems no versed   History of MRSA infection    Medical history non-contributory    Patella fracture    left     Past Surgical History:  Procedure Laterality Date   BONE EXCISION Left 09/25/2017   Procedure: PARTIAL EXCISION OF LEFT PATELLA;  Surgeon: Myrene Galas, MD;  Location: [facility] OR;  Service: Orthopedics;  Laterality: Left;   CATARACT EXTRACTION W/ INTRAOCULAR LENS  IMPLANT, BILATERAL     COLONOSCOPY     DEBRIDEMENT AND CLOSURE WOUND Left 06/19/2014   Procedure: DEBRIDEMENT AND CLOSURE LEFT KNEE;  Surgeon: Kathryne Hitch, MD;  Location: [facility] ORS;  Service: Orthopedics;  Laterality: Left;   EXTERNAL FIXATION LEG Left 07/23/2020   Procedure: EXTERNAL FIXATION LEG;  Surgeon: Yolonda Kida, MD;  Location: [facility] OR;  Service: Orthopedics;  Laterality: Left;   EXTERNAL FIXATION LEG Left 07/27/2020   Procedure: Adjustment of external fixator left leg;  Surgeon: Myrene Galas, MD;  Location: [facility] OR;  Service: Orthopedics;  Laterality: Left;   EXTERNAL FIXATION REMOVAL Left 08/12/2020   Procedure: REMOVAL EXTERNAL FIXATION LEG;  Surgeon: Myrene Galas, MD;  Location: [facility] OR;  Service: Orthopedics;  Laterality: Left;   FOREIGN BODY REMOVAL N/A 03/28/2021   Procedure: OPEN EXCISION OF INTRABDOMINAL MESH;  Surgeon: Quentin Ore, MD;  Location: [facility] ORS;  Service: General;  Laterality: N/A;    IRRIGATION AND DEBRIDEMENT KNEE Left 06/17/2014   Procedure: IRRIGATION AND DEBRIDEMENT KNEE;  Surgeon: Budd Palmer, MD;  Location: [facility] ORS;  Service: Orthopedics;  Laterality: Left;   KNEE BURSECTOMY Left 05/27/2015   KNEE BURSECTOMY Left 05/27/2015   Procedure: KNEE BURSECTOMY, suture removal patellar tendon;  Surgeon: Myrene Galas, MD;  Location: [facility] OR;  Service: Orthopedics;  Laterality: Left;   ORIF PATELLA Left 06/12/2014   Procedure: OPEN REDUCTION INTERNAL (ORIF) FIXATION PATELLA;  Surgeon: Budd Palmer, MD;  Location: [facility] OR;  Service: Orthopedics;  Laterality: Left;   ORIF TIBIA PLATEAU Left 08/12/2020   ORIF TIBIA PLATEAU Left 08/12/2020   Procedure: OPEN REDUCTION INTERNAL FIXATION (ORIF) BICONDYLAR TIBIAL PLATEAU;  Surgeon: Myrene Galas, MD;  Location: [facility] OR;  Service: Orthopedics;  Laterality: Left;   ORIF TIBIA PLATEAU Left 12/14/2020   Procedure: TIBIA REPAIR USING ILLIAC CREST BONE GRAFT VERSUS REAMED INTRAMEDULLARY ASPIRATE;  Surgeon: Myrene Galas, MD;  Location: [facility] OR;  Service: Orthopedics;  Laterality: Left;   REFRACTIVE SURGERY Bilateral    TONSILLECTOMY     VENTRAL HERNIA REPAIR N/A 03/28/2021   Procedure: REPAIR OF RECURRENT VENTRAL HERNIA WITH MESH;  Surgeon: Yarnell Kozloski, Hyman Hopes, MD;  Location: [facility] ORS;  Service: General;  Laterality: N/A;    History reviewed. No pertinent family history.  Social:  reports that he has never smoked. He has never used smokeless tobacco. He reports previous alcohol use. He reports that he does not use  drugs.  Allergies:  Allergies  Allergen Reactions   Versed [Midazolam]     Memory problems    Medications: Current Outpatient Medications  Medication Instructions   acetaminophen (TYLENOL) 500 MG tablet TAKE 1 TABLET (500 MG TOTAL) BY MOUTH EVERY TWELVE HOURS.   cetirizine (ZYRTEC) 10 mg, Oral, Daily PRN   Diclofenac Sodium CR 100 mg, Oral, Daily   docusate sodium (COLACE) 100 MG capsule TAKE 1 CAPSULE (100 MG TOTAL) BY MOUTH  TWO TIMES DAILY.   methocarbamol (ROBAXIN) 500 MG tablet TAKE 1-2 TABLETS (500-1,000 MG TOTAL) BY MOUTH EVERY SIX HOURS AS NEEDED FOR MUSCLE SPASMS.   Multiple Vitamins-Minerals (ONE-A-DAY MENS 50+) TABS 1 tablet, Oral, Daily   naloxone (NARCAN) nasal spray 4 mg/0.1 mL USE AS DIRECTED IN EITHER NOSTRIL AT SIGNS OF OPIOD OVERDOSE. MAY REPEAT IN OPPOSITE NOSTRIL WITH NEW SPRAY IN 2-3 MINUTES IF MINIMAL RESPONSE   oxyCODONE-acetaminophen (PERCOCET) 10-325 MG tablet TAKE 1-2 TABLETS BY MOUTH EVERY SIX HOURS AS NEEDED FOR PAIN.   oxyCODONE-acetaminophen (PERCOCET) 5-325 MG tablet 1-2 tablets, Oral, Every 4 hours PRN   rivaroxaban (XARELTO) 10 MG TABS tablet TAKE 1 TABLET (10 MG TOTAL) BY MOUTH DAILY.   vitamin C (ASCORBIC ACID) 500 MG tablet TAKE 1 TABLET (1,000 MG TOTAL) BY MOUTH DAILY.   Vitamin D3 (VITAMIN D) 25 MCG tablet TAKE 2 TABLETS (2,000 UNITS TOTAL) BY MOUTH DAILY.    ROS - all of the below systems have been reviewed with the patient and positives are indicated with bold text General: chills, fever or night sweats Eyes: blurry vision or double vision ENT: epistaxis or sore throat Allergy/Immunology: itchy/watery eyes or nasal congestion Hematologic/Lymphatic: bleeding problems, blood clots or swollen lymph nodes Endocrine: temperature intolerance or unexpected weight changes Breast: new or changing breast lumps or nipple discharge Resp: cough, shortness of breath, or wheezing CV: chest pain or dyspnea on exertion GI: as per HPI GU: dysuria, trouble voiding, or hematuria MSK: joint pain or joint stiffness Neuro: TIA or stroke symptoms Derm: pruritus and skin lesion changes Psych: anxiety and depression  Objective   PE Blood pressure 131/85, pulse 87, temperature 98.6 F (37 C), temperature source Oral, resp. rate 18, height 5' 11.5" (1.816 m), weight 103 kg, SpO2 97 %. Constitutional: NAD; conversant; no deformities Eyes: Moist conjunctiva; no lid lag; anicteric; PERRL Neck:  Trachea midline; no thyromegaly Lungs: Normal respiratory effort; no tactile fremitus CV: RRR; no palpable thrills; no pitting edema GI: Abd Distended, tympanic, hernia repair feels intact; no palpable hepatosplenomegaly MSK: Normal range of motion of extremities; no clubbing/cyanosis Psychiatric: Appropriate affect; alert and oriented x3 Lymphatic: No palpable cervical or axillary lymphadenopathy  Results for orders placed or performed during the hospital encounter of 04/04/21 (from the past 24 hour(s))  Lipase, blood     Status: None   Collection Time: 04/04/21 12:40 PM  Result Value Ref Range   Lipase 26 11 - 51 U/L  Comprehensive metabolic panel     Status: Abnormal   Collection Time: 04/04/21 12:40 PM  Result Value Ref Range   Sodium 133 (L) 135 - 145 mmol/L   Potassium 3.4 (L) 3.5 - 5.1 mmol/L   Chloride 93 (L) 98 - 111 mmol/L   CO2 28 22 - 32 mmol/L   Glucose, Bld 124 (H) 70 - 99 mg/dL   BUN 21 8 - 23 mg/dL   Creatinine, Ser 1.61 0.61 - 1.24 mg/dL   Calcium 8.6 (L) 8.9 - 10.3 mg/dL   Total Protein 7.7  6.5 - 8.1 g/dL   Albumin 3.7 3.5 - 5.0 g/dL   AST 44 (H) 15 - 41 U/L   ALT 49 (H) 0 - 44 U/L   Alkaline Phosphatase 76 38 - 126 U/L   Total Bilirubin 0.9 0.3 - 1.2 mg/dL   GFR, Estimated >27 >25 mL/min   Anion gap 12 5 - 15  CBC with Differential     Status: Abnormal   Collection Time: 04/04/21 12:40 PM  Result Value Ref Range   WBC 14.1 (H) 4.0 - 10.5 K/uL   RBC 5.25 4.22 - 5.81 MIL/uL   Hemoglobin 14.1 13.0 - 17.0 g/dL   HCT 36.6 44.0 - 34.7 %   MCV 84.6 80.0 - 100.0 fL   MCH 26.9 26.0 - 34.0 pg   MCHC 31.8 30.0 - 36.0 g/dL   RDW 42.5 95.6 - 38.7 %   Platelets 306 150 - 400 K/uL   nRBC 0.0 0.0 - 0.2 %   Neutrophils Relative % 83 %   Neutro Abs 11.6 (H) 1.7 - 7.7 K/uL   Lymphocytes Relative 7 %   Lymphs Abs 1.0 0.7 - 4.0 K/uL   Monocytes Relative 10 %   Monocytes Absolute 1.5 (H) 0.1 - 1.0 K/uL   Eosinophils Relative 0 %   Eosinophils Absolute 0.0 0.0 - 0.5  K/uL   Basophils Relative 0 %   Basophils Absolute 0.0 0.0 - 0.1 K/uL   Immature Granulocytes 0 %   Abs Immature Granulocytes 0.05 0.00 - 0.07 K/uL  Magnesium     Status: Abnormal   Collection Time: 04/04/21 12:40 PM  Result Value Ref Range   Magnesium 2.6 (H) 1.7 - 2.4 mg/dL     Imaging Orders  CT ABDOMEN PELVIS W CONTRAST    Assessment and Plan   Orlando Devereux is an 70 y.o. male with either a post operative ileus or bowel obstruction.  We will check a CT scan then he will likely need NG placement for decompression.  If he has an interparietal hernia he may require laparoscopic surgery to repair this.  He will be admitted to the surgery service.  Quentin Ore, MD  [facility] Use [email or web address] to contact on call provider

## 2021-04-05 ENCOUNTER — Inpatient Hospital Stay (HOSPITAL_COMMUNITY): Payer: Medicare Other

## 2021-04-05 ENCOUNTER — Encounter (HOSPITAL_COMMUNITY): Payer: Self-pay | Admitting: Surgery

## 2021-04-05 LAB — BASIC METABOLIC PANEL
Anion gap: 8 (ref 5–15)
BUN: 15 mg/dL (ref 8–23)
CO2: 28 mmol/L (ref 22–32)
Calcium: 7.4 mg/dL — ABNORMAL LOW (ref 8.9–10.3)
Chloride: 98 mmol/L (ref 98–111)
Creatinine, Ser: 1.08 mg/dL (ref 0.61–1.24)
GFR, Estimated: >60 mL/min (ref >60)
Glucose, Bld: 119 mg/dL — ABNORMAL HIGH (ref 70–99)
Potassium: 3.9 mmol/L (ref 3.5–5.1)
Sodium: 134 mmol/L — ABNORMAL LOW (ref 135–145)

## 2021-04-05 LAB — CBC
HCT: 37.7 % — ABNORMAL LOW (ref 39.0–52.0)
Hemoglobin: 11.8 g/dL — ABNORMAL LOW (ref 13.0–17.0)
MCH: 26.8 pg (ref 26.0–34.0)
MCHC: 31.3 g/dL (ref 30.0–36.0)
MCV: 85.7 fL (ref 80.0–100.0)
Platelets: 236 10*3/uL (ref 150–400)
RBC: 4.4 MIL/uL (ref 4.22–5.81)
RDW: 14.8 % (ref 11.5–15.5)
WBC: 12.8 10*3/uL — ABNORMAL HIGH (ref 4.0–10.5)
nRBC: 0 % (ref 0.0–0.2)

## 2021-04-05 MED ORDER — MENTHOL 3 MG MT LOZG
1.0000 | LOZENGE | OROMUCOSAL | Status: DC | PRN
  Administered 2021-04-05: 3 mg via ORAL
  Filled 2021-04-05: qty 9

## 2021-04-05 MED ORDER — ACETAMINOPHEN 10 MG/ML IV SOLN
1000.0000 mg | Freq: Four times a day (QID) | INTRAVENOUS | Status: AC
  Administered 2021-04-05 (×3): 1000 mg via INTRAVENOUS
  Filled 2021-04-05 (×4): qty 100

## 2021-04-05 MED ORDER — HYDROMORPHONE HCL 1 MG/ML IJ SOLN
1.0000 mg | INTRAMUSCULAR | Status: DC | PRN
  Administered 2021-04-05: 1 mg via INTRAVENOUS
  Filled 2021-04-05: qty 1

## 2021-04-05 MED ORDER — OXYCODONE HCL 5 MG PO TABS
10.0000 mg | ORAL_TABLET | ORAL | Status: DC | PRN
  Administered 2021-04-06 – 2021-04-13 (×6): 10 mg via ORAL
  Filled 2021-04-05 (×8): qty 2

## 2021-04-05 MED ORDER — OXYCODONE HCL 5 MG PO TABS
5.0000 mg | ORAL_TABLET | ORAL | Status: DC | PRN
  Administered 2021-04-10 – 2021-04-11 (×3): 5 mg via ORAL
  Filled 2021-04-05 (×3): qty 1

## 2021-04-05 MED ORDER — KCL IN DEXTROSE-NACL 20-5-0.45 MEQ/L-%-% IV SOLN
INTRAVENOUS | Status: DC
  Filled 2021-04-05 (×5): qty 1000

## 2021-04-05 MED ORDER — OXYCODONE HCL 5 MG/5ML PO SOLN
10.0000 mg | ORAL | Status: DC | PRN
  Administered 2021-04-05 – 2021-04-06 (×4): 10 mg via ORAL
  Filled 2021-04-05 (×4): qty 10

## 2021-04-05 MED ORDER — HYDROMORPHONE HCL 1 MG/ML IJ SOLN
1.0000 mg | INTRAMUSCULAR | Status: DC | PRN
  Administered 2021-04-05 – 2021-04-09 (×16): 1 mg via INTRAVENOUS
  Filled 2021-04-05 (×18): qty 1

## 2021-04-05 MED ORDER — OXYCODONE HCL 5 MG/5ML PO SOLN: 5.0000 mg | ORAL | Status: DC | PRN

## 2021-04-05 NOTE — Progress Notes (Signed)
Progress Note: General Surgery Service   Chief Complaint/Subjective: Doing as expected postoperative day 1 - pain around incision, some drainage from JP and NG  Objective: Vital signs in last 24 hours: Temp:  [97.4 F (36.3 C)-98.6 F (37 C)] 97.9 F (36.6 C) (06/28 0611) Pulse Rate:  [78-108] 89 (06/28 0611) Resp:  [11-20] 16 (06/28 0611) BP: (109-144)/(71-92) 109/71 (06/28 0611) SpO2:  [93 %-100 %] 97 % (06/28 0611) Weight:  [103 kg] 103 kg (06/27 1653) Last BM Date: 04/02/21  Intake/Output from previous day: 06/27 0701 - 06/28 0700 In: 2590 [I.V.:2290; IV Piggyback:300] Out: 1225 [Urine:800; Drains:400; Blood:25] Intake/Output this shift: No intake/output data recorded.  GI: Abd soft, mild distention, pain around incisions, JP with dark thin fluid, penrose drain with some thin dark drainage  Lab Results: CBC  Recent Labs    04/04/21 1240 04/05/21 0355  WBC 14.1* 12.8*  HGB 14.1 11.8*  HCT 44.4 37.7*  PLT 306 236   BMET Recent Labs    04/04/21 1240 04/05/21 0355  NA 133* 134*  K 3.4* 3.9  CL 93* 98  CO2 28 28  GLUCOSE 124* 119*  BUN 21 15  CREATININE 1.03 1.08  CALCIUM 8.6* 7.4*   PT/INR No results for input(s): LABPROT, INR in the last 72 hours. ABG No results for input(s): PHART, HCO3 in the last 72 hours.  Invalid input(s): PCO2, PO2  Anti-infectives: Anti-infectives (From admission, onward)    Start     Dose/Rate Route Frequency Ordered Stop   04/05/21 0200  ceFAZolin (ANCEF) IVPB 1 g/50 mL premix        1 g 100 mL/hr over 30 Minutes Intravenous Every 8 hours 04/04/21 1948 04/09/21 0159   04/04/21 2200  metroNIDAZOLE (FLAGYL) IVPB 500 mg        500 mg 100 mL/hr over 60 Minutes Intravenous Every 8 hours 04/04/21 1948 04/08/21 2159   04/04/21 1745  ceFAZolin (ANCEF) IVPB 2g/100 mL premix        2 g 200 mL/hr over 30 Minutes Intravenous  Once 04/04/21 1730 04/04/21 1815   04/04/21 1715  ceFAZolin (ANCEF) 2-4 GM/100ML-% IVPB       Note to  Pharmacy: Sharyn Creamer   : cabinet override      04/04/21 1715 04/04/21 1818       Medications: Scheduled Meds:  acetaminophen  650 mg Oral Q6H   enoxaparin (LOVENOX) injection  40 mg Subcutaneous Q24H   Continuous Infusions:  acetaminophen 1,000 mg (04/05/21 0111)    ceFAZolin (ANCEF) IV 1 g (04/05/21 0936)   lactated ringers 125 mL/hr at 04/05/21 0935   metronidazole 500 mg (04/05/21 0533)   PRN Meds:.HYDROmorphone (DILAUDID) injection, menthol-cetylpyridinium, ondansetron **OR** ondansetron (ZOFRAN) IV, oxyCODONE, oxyCODONE, prochlorperazine **OR** prochlorperazine  Assessment/Plan: s/p Procedure(s): Diagnostic laparotomy, exploratory laparotomy, small bowel resection with mesh 04/04/2021  Doing as expected postoperative day 1 Awaiting return of bowel function Check AXR for NG placement, may need advanced Switch to maintenance fluids 4 days ancef flagyl for intra-abdominal contamination     LOS: 1 day   FEN: IVF to maintenance fluids ID: Ancef flagyl x 4 days VTE: Lovenox Foley: None Dispo: Continued care on floor, awaiting return of bowel function    Quentin Ore, MD  [facility] Use [email or web address] to contact on call provider

## 2021-04-05 NOTE — Progress Notes (Signed)
Called into OR to ask about patient's pain medication.   MD agreeable to increasing IV dilaudid to 1mg  IV.   Will give to patient and reassess pain.     SWhittemore, 

## 2021-04-05 NOTE — Progress Notes (Signed)
Called MD office related to patient's pain level. Patient continuing to report pain of 9/10.   Ice pack also used to surgical site. Patient reports some relief.   He is taking 10mg  PO oxy suspension, IV tylenol (1000mg ), and 0.5mg  IV dilaudid without much relief. Walking encouraged but patient reports that he cannot tolerate moving at this time.   Triage RN paged out to Dr. a [phone].   SWhittemore, Dossie Der

## 2021-04-06 ENCOUNTER — Inpatient Hospital Stay (HOSPITAL_COMMUNITY): Payer: Medicare Other

## 2021-04-06 DIAGNOSIS — M6289 Other specified disorders of muscle: Secondary | ICD-10-CM

## 2021-04-06 LAB — BASIC METABOLIC PANEL
Anion gap: 7 (ref 5–15)
BUN: 12 mg/dL (ref 8–23)
CO2: 29 mmol/L (ref 22–32)
Calcium: 8.1 mg/dL — ABNORMAL LOW (ref 8.9–10.3)
Chloride: 98 mmol/L (ref 98–111)
Creatinine, Ser: 0.98 mg/dL (ref 0.61–1.24)
GFR, Estimated: >60 mL/min (ref >60)
Glucose, Bld: 121 mg/dL — ABNORMAL HIGH (ref 70–99)
Potassium: 3.6 mmol/L (ref 3.5–5.1)
Sodium: 134 mmol/L — ABNORMAL LOW (ref 135–145)

## 2021-04-06 LAB — CBC
HCT: 44.8 % (ref 39.0–52.0)
Hemoglobin: 13.8 g/dL (ref 13.0–17.0)
MCH: 26.8 pg (ref 26.0–34.0)
MCHC: 30.8 g/dL (ref 30.0–36.0)
MCV: 87.2 fL (ref 80.0–100.0)
Platelets: 318 10*3/uL (ref 150–400)
RBC: 5.14 MIL/uL (ref 4.22–5.81)
RDW: 15 % (ref 11.5–15.5)
WBC: 14.8 10*3/uL — ABNORMAL HIGH (ref 4.0–10.5)
nRBC: 0 % (ref 0.0–0.2)

## 2021-04-06 LAB — SURGICAL PATHOLOGY

## 2021-04-06 MED ORDER — PHENOL 1.4 % MT LIQD
1.0000 | OROMUCOSAL | Status: DC | PRN
  Filled 2021-04-06: qty 177

## 2021-04-06 MED ORDER — POTASSIUM CHLORIDE 10 MEQ/100ML IV SOLN
10.0000 meq | INTRAVENOUS | Status: AC
  Administered 2021-04-06 (×4): 10 meq via INTRAVENOUS
  Filled 2021-04-06 (×4): qty 100

## 2021-04-06 MED ORDER — METOCLOPRAMIDE HCL 5 MG/ML IJ SOLN
10.0000 mg | Freq: Four times a day (QID) | INTRAMUSCULAR | Status: AC
  Administered 2021-04-06 – 2021-04-11 (×20): 10 mg via INTRAVENOUS
  Filled 2021-04-06 (×20): qty 2

## 2021-04-06 NOTE — Progress Notes (Addendum)
04/06/21 1600  Mobility  Activity Ambulated in hall  Level of Assistance Moderate assist, patient does 50-74% (Min A necessary for ambulation)  Assistive Device [employer] Ambulated (ft) 45 ft  Mobility Ambulated with assistance in hallway  Mobility Response Tolerated well  Mobility performed by Mobility specialist  $Mobility charge 1 Mobility     Post-mobility: 125 HR, 116/79 BP, 96 SPO2   Upon entry pt c/o of 6/10 pain in abdominal area. Cued log roll to sit EOB with Mod A. Required Mod A to stand while using cane. Pt ambulated 45 ft in hallway with contact guard assist and took 1 standing rest break. Pt c/o of dizziness and SOB towards middle and end of session so vitals were taken and documented above. Pt left in recliner with call bell at side and family in room with chair alarm on. Informed RN about vitals at end of session.   Arliss Journey Mobility Specialist Acute Rehabilitation Services Office: [phone] 04/06/21, 4:24 PM

## 2021-04-06 NOTE — Progress Notes (Addendum)
Progress Note: General Surgery Service   Chief Complaint/Subjective: Ice pack broke open and caused him to flinch causing a lot of pain this morning.  NG with increased output.  No flatus or bowel movements.  Feels more distended.  Objective: Vital signs in last 24 hours: Temp:  [97.6 F (36.4 C)-99.1 F (37.3 C)] 99.1 F (37.3 C) (06/29 0625) Pulse Rate:  [84-124] 124 (06/29 0625) Resp:  [16-17] 17 (06/29 0625) BP: (114-128)/(65-70) 128/70 (06/29 0625) SpO2:  [90 %-96 %] 90 % (06/29 0625) Last BM Date: 03/27/21  Intake/Output from previous day: 06/28 0701 - 06/29 0700 In: 1660 [P.O.:90; I.V.:1000; IV Piggyback:550] Out: 2245 [Urine:650; Emesis/NG output:1490; Drains:105] Intake/Output this shift: No intake/output data recorded.  GI: Abd firm, distended, pain around incisions, JP with dark thin fluid, penrose drain with some thin dark drainage  Lab Results: CBC  Recent Labs    04/05/21 0355 04/06/21 0433  WBC 12.8* 14.8*  HGB 11.8* 13.8  HCT 37.7* 44.8  PLT 236 318    BMET Recent Labs    04/05/21 0355 04/06/21 0433  NA 134* 134*  K 3.9 3.6  CL 98 98  CO2 28 29  GLUCOSE 119* 121*  BUN 15 12  CREATININE 1.08 0.98  CALCIUM 7.4* 8.1*    PT/INR No results for input(s): LABPROT, INR in the last 72 hours. ABG No results for input(s): PHART, HCO3 in the last 72 hours.  Invalid input(s): PCO2, PO2  Anti-infectives: Anti-infectives (From admission, onward)    Start     Dose/Rate Route Frequency Ordered Stop   04/05/21 0200  ceFAZolin (ANCEF) IVPB 1 g/50 mL premix        1 g 100 mL/hr over 30 Minutes Intravenous Every 8 hours 04/04/21 1948 04/09/21 0159   04/04/21 2200  metroNIDAZOLE (FLAGYL) IVPB 500 mg        500 mg 100 mL/hr over 60 Minutes Intravenous Every 8 hours 04/04/21 1948 04/08/21 2159   04/04/21 1745  ceFAZolin (ANCEF) IVPB 2g/100 mL premix        2 g 200 mL/hr over 30 Minutes Intravenous  Once 04/04/21 1730 04/04/21 1815   04/04/21 1715   ceFAZolin (ANCEF) 2-4 GM/100ML-% IVPB       Note to Pharmacy: Sharyn Creamer   : cabinet override      04/04/21 1715 04/04/21 1818       Medications: Scheduled Meds:  acetaminophen  650 mg Oral Q6H   enoxaparin (LOVENOX) injection  40 mg Subcutaneous Q24H   Continuous Infusions:   ceFAZolin (ANCEF) IV Stopped (04/06/21 0209)   dextrose 5 % and 0.45 % NaCl with KCl 20 mEq/L 100 mL/hr at 04/05/21 1121   metronidazole 500 mg (04/06/21 0516)   potassium chloride     PRN Meds:.HYDROmorphone (DILAUDID) injection, menthol-cetylpyridinium, ondansetron **OR** ondansetron (ZOFRAN) IV, oxyCODONE **OR** oxyCODONE, oxyCODONE **OR** oxyCODONE, prochlorperazine **OR** prochlorperazine  Assessment/Plan: s/p Procedure(s): Diagnostic laparotomy, exploratory laparotomy, small bowel resection with mesh 04/04/2021  Pain, distention and tachycardia concerning this morning but WBC only 14 and related to when ice pack broke open. Check AXR for gas pattern to see if obstruction resolving Awaiting return of bowel function Maintenance fluids 4 days ancef flagyl for intra-abdominal contamination  Replace potassium   LOS: 2 days   FEN: IVF to maintenance fluids ID: Ancef flagyl x 4 days VTE: Lovenox Foley: None Dispo: Continued care on floor, awaiting return of bowel function    Quentin Ore, MD  [facility] Use [email or web address]  to contact on call provider

## 2021-04-06 NOTE — Progress Notes (Addendum)
Call out to covering MD. Patient in Yellow MEWS due to elevated Pulse. Updated day shift RN'sShon and [place].   04/06/21 0625  Assess: MEWS Score  Temp 99.1 F (37.3 C)  BP 128/70  Pulse Rate (!) 124  Resp 17  SpO2 90 %  O2 Device Room Air  Patient Activity (if Appropriate) In bed  Assess: MEWS Score  MEWS Temp 0  MEWS Systolic 0  MEWS Pulse 2  MEWS RR 0  MEWS LOC 0  MEWS Score 2  MEWS Score Color Yellow  Assess: if the MEWS score is Yellow or Red  Were vital signs taken at a resting state? Yes  Focused Assessment Change from prior assessment (see assessment flowsheet)  Does the patient meet 2 or more of the SIRS criteria? No  MEWS guidelines implemented *See Row Information* Yes  Treat  MEWS Interventions Administered prn meds/treatments  Pain Scale 0-10  Pain Score 7  Pain Type Surgical pain  Pain Location Abdomen  Pain Orientation Mid;Lower  Pain Descriptors / Indicators Aching;Discomfort  Pain Frequency Constant  Pain Onset On-going  Patients Stated Pain Goal 2  Pain Intervention(s) Medication (See eMAR)  Multiple Pain Sites No  Complains of Other (Comment) (bloating)  Interventions Reposition  Patients response to intervention Relief  Take Vital Signs  Increase Vital Sign Frequency  Yellow: Q 2hr X 2 then Q 4hr X 2, if remains yellow, continue Q 4hrs  Notify: Charge Nurse/RN  Name of Charge Nurse/RN Notified Courtney  Date Charge Nurse/RN Notified 04/06/21  Time Charge Nurse/RN Notified 0630  Notify: Provider  Provider Name/Title [employer] (Paged Covering Team)  Date Provider Notified 04/06/21  Notification Type Page  Notification Reason Other (Comment) (Yellow MEWS)  Provider response Other (Comment) (Awaiting)  Document  Progress note created (see row info) Yes  Assess: SIRS CRITERIA  SIRS Temperature  0  SIRS Pulse 1  SIRS Respirations  0  SIRS WBC 1  SIRS Score Sum  2

## 2021-04-07 ENCOUNTER — Inpatient Hospital Stay (HOSPITAL_COMMUNITY): Payer: Medicare Other

## 2021-04-07 ENCOUNTER — Encounter (HOSPITAL_COMMUNITY): Payer: Self-pay | Admitting: Surgery

## 2021-04-07 ENCOUNTER — Inpatient Hospital Stay: Payer: Self-pay

## 2021-04-07 DIAGNOSIS — E440 Moderate protein-calorie malnutrition: Secondary | ICD-10-CM | POA: Insufficient documentation

## 2021-04-07 LAB — BASIC METABOLIC PANEL
Anion gap: 9 (ref 5–15)
BUN: 9 mg/dL (ref 8–23)
CO2: 24 mmol/L (ref 22–32)
Calcium: 7.8 mg/dL — ABNORMAL LOW (ref 8.9–10.3)
Chloride: 98 mmol/L (ref 98–111)
Creatinine, Ser: 0.78 mg/dL (ref 0.61–1.24)
GFR, Estimated: >60 mL/min (ref >60)
Glucose, Bld: 123 mg/dL — ABNORMAL HIGH (ref 70–99)
Potassium: 3.7 mmol/L (ref 3.5–5.1)
Sodium: 131 mmol/L — ABNORMAL LOW (ref 135–145)

## 2021-04-07 LAB — CBC
HCT: 43.2 % (ref 39.0–52.0)
Hemoglobin: 13.7 g/dL (ref 13.0–17.0)
MCH: 27.2 pg (ref 26.0–34.0)
MCHC: 31.7 g/dL (ref 30.0–36.0)
MCV: 85.9 fL (ref 80.0–100.0)
Platelets: 328 10*3/uL (ref 150–400)
RBC: 5.03 MIL/uL (ref 4.22–5.81)
RDW: 15.2 % (ref 11.5–15.5)
WBC: 18.5 10*3/uL — ABNORMAL HIGH (ref 4.0–10.5)
nRBC: 0 % (ref 0.0–0.2)

## 2021-04-07 LAB — GLUCOSE, CAPILLARY: Glucose-Capillary: 114 mg/dL — ABNORMAL HIGH (ref 70–99)

## 2021-04-07 LAB — MAGNESIUM: Magnesium: 2.1 mg/dL (ref 1.7–2.4)

## 2021-04-07 LAB — PHOSPHORUS: Phosphorus: 1.9 mg/dL — ABNORMAL LOW (ref 2.5–4.6)

## 2021-04-07 MED ORDER — INSULIN ASPART 100 UNIT/ML IJ SOLN
0.0000 [IU] | Freq: Four times a day (QID) | INTRAMUSCULAR | Status: DC
  Administered 2021-04-08 (×2): 1 [IU] via SUBCUTANEOUS
  Administered 2021-04-09: 2 [IU] via SUBCUTANEOUS
  Administered 2021-04-09 – 2021-04-11 (×6): 1 [IU] via SUBCUTANEOUS
  Administered 2021-04-12: 2 [IU] via SUBCUTANEOUS

## 2021-04-07 MED ORDER — PIPERACILLIN-TAZOBACTAM 3.375 G IVPB
3.3750 g | Freq: Three times a day (TID) | INTRAVENOUS | Status: DC
  Administered 2021-04-07 – 2021-04-14 (×21): 3.375 g via INTRAVENOUS
  Filled 2021-04-07 (×22): qty 50

## 2021-04-07 MED ORDER — SODIUM CHLORIDE 0.9% FLUSH: 10.0000 mL | INTRAVENOUS | Status: DC | PRN

## 2021-04-07 MED ORDER — CHLORHEXIDINE GLUCONATE CLOTH 2 % EX PADS
6.0000 | MEDICATED_PAD | Freq: Every day | CUTANEOUS | Status: DC
  Administered 2021-04-07 – 2021-04-15 (×9): 6 via TOPICAL

## 2021-04-07 MED ORDER — TRAVASOL 10 % IV SOLN
INTRAVENOUS | Status: AC
  Filled 2021-04-07: qty 384

## 2021-04-07 MED ORDER — KCL IN DEXTROSE-NACL 20-5-0.45 MEQ/L-%-% IV SOLN
INTRAVENOUS | Status: DC
  Filled 2021-04-07: qty 1000

## 2021-04-07 MED ORDER — PANTOPRAZOLE SODIUM 40 MG IV SOLR
40.0000 mg | Freq: Two times a day (BID) | INTRAVENOUS | Status: DC
  Administered 2021-04-07 – 2021-04-12 (×12): 40 mg via INTRAVENOUS
  Filled 2021-04-07 (×12): qty 40

## 2021-04-07 MED ORDER — SODIUM PHOSPHATES 45 MMOLE/15ML IV SOLN
20.0000 mmol | Freq: Once | INTRAVENOUS | Status: AC
  Administered 2021-04-07: 20 mmol via INTRAVENOUS
  Filled 2021-04-07 (×2): qty 6.67

## 2021-04-07 NOTE — Progress Notes (Signed)
Initial Nutrition Assessment  DOCUMENTATION CODES:   Obesity unspecified, Non-severe (moderate) malnutrition in context of acute illness/injury  INTERVENTION:   -TPN management per Pharmacy  -Will monitor for diet advancement  NUTRITION DIAGNOSIS:   Moderate Malnutrition related to acute illness (s/p hernia repair, SBO) as evidenced by moderate fat depletion, mild muscle depletion, energy intake < 75% for > 7 days.  GOAL:   Patient will meet greater than or equal to 90% of their needs  MONITOR:   Diet advancement, Labs, Weight trends, I & O's (TPN)  REASON FOR ASSESSMENT:   Consult New TPN/TNA  ASSESSMENT:   70 y.o. male who is here for nausea, vomiting and abdominal pain after ventral hernia repair on 6/20.  6/27: admitted, s/p dx lap, ex lap, SB resection   Patient in room with wife at bedside. Pt reports not eating well following his hernia repair on 6/20 (10 days ago). Prior to this surgery he reports eating normally, 3 meals a day consistently. Pt was unable to tolerate liquids after surgery. Was vomiting for 5 days PTA.  NGT in place and NPO, awaiting bowel movement.  TPN to begin today following PICC line placement. Per pharmacy, will start at 40 ml/hr, providing 835 kcals and 63g protein.  Per weight records, pt has lost 6 lbs since 08/12/20 (2% wt loss x 8 months, insignificant for time frame).   Medications: Reglan, sodium phosphate,   Labs reviewed: Low Na, Phos  NUTRITION - FOCUSED PHYSICAL EXAM:  Flowsheet Row Most Recent Value  Orbital Region Moderate depletion  Upper Arm Region Mild depletion  Thoracic and Lumbar Region Unable to assess  Buccal Region Unable to assess  Temple Region Mild depletion  Clavicle Bone Region Mild depletion  Clavicle and Acromion Bone Region Mild depletion  Scapular Bone Region Unable to assess  Dorsal Hand Mild depletion  Patellar Region Unable to assess  Anterior Thigh Region Unable to assess  Posterior Calf  Region Unable to assess  Edema (RD Assessment) Moderate  Hair Reviewed  Eyes Reviewed  Mouth Reviewed       Diet Order:   Diet Order             Diet NPO time specified Except for: Ice Chips  Diet effective now                   EDUCATION NEEDS:   Education needs have been addressed  Skin:  Skin Assessment: Reviewed RN Assessment  Last BM:  6/19  Height:   Ht Readings from Last 1 Encounters:  04/04/21 5' 11.5" (1.816 m)    Weight:   Wt Readings from Last 1 Encounters:  04/04/21 103 kg    BMI:  Body mass index is 31.23 kg/m.  Estimated Nutritional Needs:   Kcal:  2150-2350  Protein:  100-115g  Fluid:  2.3L/day  Tilda Franco, MS, RD, LDN Inpatient Clinical Dietitian Contact information available via Amion

## 2021-04-07 NOTE — Progress Notes (Signed)
PHARMACY - TOTAL PARENTERAL NUTRITION CONSULT NOTE   Indication: SBO  Patient Measurements: Height: 5' 11.5" (181.6 cm) Weight: 103 kg (227 lb 1.2 oz) IBW/kg (Calculated) : 76.45 TPN AdjBW (KG): 83.1 Body mass index is 31.23 kg/m.   Assessment: 6/27 Diag > Expl Lap with SBO, mesh repair, peritonitis  Glucose / Insulin: no hx DM Electrolytes: Na, Ca, Phos low Renal: renal fx wnl Hepatic: CMET tomorrow Intake / Output; MIVF: D5 1/2 NS with 20 mEq KCl at 100 ml/hr GI Imaging: GI Surgeries / Procedures:  6/20 repair recurrent ventral hernia w/mesh.  6/27 Diag > Expl Lap with SBO, mesh repair, peritonitis  Central access:  TPN start date: 6/30 planned PICC insertion  Nutritional Goals (per RD recommendation on 6/30): kCal: 100-115 , Protein: 100-115gm, Fluid: 2.3 L per day  Goal TPN rate is  mL/hr (provides  g of protein and  kcals per day)  Current Nutrition: NPO  Plan:  Na Phos 20 mMol bolus x1  Start TPN at 54mL/hr at 1800 Electrolytes in TPN: Na 15mEq/L, K 21mEq/L, Ca 12mEq/L, Mg 51mEq/L, and Phos 12mmol/L. Cl:Ac 1:1 Add standard MVI and trace elements to TPN Initiate Sensitive q6h SSI and adjust as needed  Reduce MIVF to 70 mL/hr at 1800 Monitor TPN labs on Mon/Thurs, full labs in am  Otho Bellows PharmD 04/07/2021,11:40 AM

## 2021-04-07 NOTE — Progress Notes (Signed)
Peripherally Inserted Central Catheter Placement  The IV Nurse has discussed with the patient and/or persons authorized to consent for the patient, the purpose of this procedure and the potential benefits and risks involved with this procedure.  The benefits include less needle sticks, lab draws from the catheter, and the patient may be discharged home with the catheter. Risks include, but not limited to, infection, bleeding, blood clot (thrombus formation), and puncture of an artery; nerve damage and irregular heartbeat and possibility to perform a PICC exchange if needed/ordered by physician.  Alternatives to this procedure were also discussed.  Bard Power PICC patient education guide, fact sheet on infection prevention and patient information card has been provided to patient /or left at bedside.    PICC Placement Documentation  PICC Double Lumen 04/07/21 PICC Right Basilic 38 cm 0 cm (Active)  Indication for Insertion or Continuance of Line Administration of hyperosmolar/irritating solutions (i.e. TPN, Vancomycin, etc.) 04/07/21 1717  Exposed Catheter (cm) 0 cm 04/07/21 1717  Site Assessment Clean;Dry;Intact 04/07/21 1717  Lumen #1 Status Flushed;Saline locked;Blood return noted 04/07/21 1717  Lumen #2 Status Flushed;Saline locked;Blood return noted 04/07/21 1717  Dressing Type Transparent 04/07/21 1717  Dressing Status Clean;Dry;Intact 04/07/21 1717  Antimicrobial disc in place? Yes 04/07/21 1717  Safety Lock Not Applicable 04/07/21 1717  Line Care Connections checked and tightened 04/07/21 1717  Line Adjustment (NICU/IV Team Only) No 04/07/21 1717  Dressing Intervention New dressing 04/07/21 1717  Dressing Change Due 04/14/21 04/07/21 1717       Sonia Stickels, Lajean Manes 04/07/2021, 5:18 PM

## 2021-04-07 NOTE — Care Management Important Message (Signed)
Important Message  Patient Details IM Letter given to the Patient. Name: Thorin Starner MRN: [id number] Date of Birth: 18-Jun-1951   Medicare Important Message Given:  Yes     Caren Macadam 04/07/2021, 11:10 AM

## 2021-04-07 NOTE — Progress Notes (Signed)
Progress Note: General Surgery Service   Chief Complaint/Subjective: Pain and tachycardia throughout the day yesterday.  Feeling a bit better today.  CT demonstrated repair intact this morning but extensive mesenteric edema.  Objective: Vital signs in last 24 hours: Temp:  [98.3 F (36.8 C)-99.2 F (37.3 C)] 98.7 F (37.1 C) (06/30 0558) Pulse Rate:  [107-110] 107 (06/30 0558) Resp:  [17-18] 18 (06/30 0558) BP: (111-139)/(76-88) 137/88 (06/30 0558) SpO2:  [91 %-95 %] 95 % (06/30 0558) Last BM Date: 03/27/21  Intake/Output from previous day: 06/29 0701 - 06/30 0700 In: 2320 [P.O.:120; I.V.:1400; IV Piggyback:800] Out: 2210 [Urine:350; Emesis/NG output:1800; Drains:60] Intake/Output this shift: No intake/output data recorded.  GI: Abd firm, little less distended than yesterday, pain around incisions, JP with dark thin fluid, penrose drain with some thin dark drainage  Lab Results: CBC  Recent Labs    04/06/21 0433 04/07/21 0427  WBC 14.8* 18.5*  HGB 13.8 13.7  HCT 44.8 43.2  PLT 318 328    BMET Recent Labs    04/06/21 0433 04/07/21 0427  NA 134* 131*  K 3.6 3.7  CL 98 98  CO2 29 24  GLUCOSE 121* 123*  BUN 12 9  CREATININE 0.98 0.78  CALCIUM 8.1* 7.8*    PT/INR No results for input(s): LABPROT, INR in the last 72 hours. ABG No results for input(s): PHART, HCO3 in the last 72 hours.  Invalid input(s): PCO2, PO2  Anti-infectives: Anti-infectives (From admission, onward)    Start     Dose/Rate Route Frequency Ordered Stop   04/05/21 0200  ceFAZolin (ANCEF) IVPB 1 g/50 mL premix        1 g 100 mL/hr over 30 Minutes Intravenous Every 8 hours 04/04/21 1948 04/09/21 0159   04/04/21 2200  metroNIDAZOLE (FLAGYL) IVPB 500 mg        500 mg 100 mL/hr over 60 Minutes Intravenous Every 8 hours 04/04/21 1948 04/08/21 2159   04/04/21 1745  ceFAZolin (ANCEF) IVPB 2g/100 mL premix        2 g 200 mL/hr over 30 Minutes Intravenous  Once 04/04/21 1730 04/04/21 1815    04/04/21 1715  ceFAZolin (ANCEF) 2-4 GM/100ML-% IVPB       Note to Pharmacy: Sharyn Creamer   : cabinet override      04/04/21 1715 04/04/21 1818       Medications: Scheduled Meds:  acetaminophen  650 mg Oral Q6H   enoxaparin (LOVENOX) injection  40 mg Subcutaneous Q24H   metoCLOPramide (REGLAN) injection  10 mg Intravenous Q6H   Continuous Infusions:   ceFAZolin (ANCEF) IV 1 g (04/07/21 0300)   dextrose 5 % and 0.45 % NaCl with KCl 20 mEq/L 100 mL/hr at 04/06/21 1248   metronidazole 500 mg (04/07/21 0616)   PRN Meds:.HYDROmorphone (DILAUDID) injection, menthol-cetylpyridinium, ondansetron **OR** ondansetron (ZOFRAN) IV, oxyCODONE **OR** oxyCODONE, oxyCODONE **OR** oxyCODONE, phenol, prochlorperazine **OR** prochlorperazine  Assessment/Plan: Mr. Cannata is a 70 year old male who underwent recurrent incisional hernia repair with removal of previous intraperitoneal mesh and rives stoppa style repair.  His posterior layer broke down where the previous mesh had been removed requiring takeback to the operating room for diagnostic laparotomy converted to exploratory laparotomy, small bowel resection with vicryl mesh reconstruction of the posterior layer and phasix retromuscular mesh placement on 04/04/2021.   Pain, distention and tachycardia slightly improved today CT demonstrates ventral hernia repair intact.  Significant mesenteric edema which appears to involve the loops of small intestine that were involved in the  hernia.  This area of mesenteric edema also includes the anastomosis. The patient's symptoms and CT findings are likely from ischemia and reperfusion of the segment of intestine that was involved in the interparietal hernia as well as peritonitis from fecal spillage during the case.  His symptoms are improved today compared to yesterday, but he still may require takeback to the operating room for small bowel resection if he fails to improve. Start PICC and TPN Continue NG to  LIWS Awaiting return of bowel function Maintenance fluids Continue antibiotics, use zosyn for intra-abdominal contamination     LOS: 3 days   FEN: PICC, TPN ID: zosyn VTE: Lovenox Foley: None Dispo: Continued care on floor, awaiting return of bowel function    Quentin Ore, MD  [facility] Use [email or web address] to contact on call provider

## 2021-04-08 ENCOUNTER — Inpatient Hospital Stay (HOSPITAL_COMMUNITY): Payer: Medicare Other

## 2021-04-08 LAB — GLUCOSE, CAPILLARY
Glucose-Capillary: 118 mg/dL — ABNORMAL HIGH (ref 70–99)
Glucose-Capillary: 123 mg/dL — ABNORMAL HIGH (ref 70–99)
Glucose-Capillary: 124 mg/dL — ABNORMAL HIGH (ref 70–99)
Glucose-Capillary: 127 mg/dL — ABNORMAL HIGH (ref 70–99)
Glucose-Capillary: 99 mg/dL (ref 70–99)

## 2021-04-08 LAB — CBC
HCT: 38.9 % — ABNORMAL LOW (ref 39.0–52.0)
Hemoglobin: 12.2 g/dL — ABNORMAL LOW (ref 13.0–17.0)
MCH: 26.8 pg (ref 26.0–34.0)
MCHC: 31.4 g/dL (ref 30.0–36.0)
MCV: 85.3 fL (ref 80.0–100.0)
Platelets: 326 10*3/uL (ref 150–400)
RBC: 4.56 MIL/uL (ref 4.22–5.81)
RDW: 15.1 % (ref 11.5–15.5)
WBC: 16.6 10*3/uL — ABNORMAL HIGH (ref 4.0–10.5)
nRBC: 0 % (ref 0.0–0.2)

## 2021-04-08 LAB — COMPREHENSIVE METABOLIC PANEL
ALT: 14 U/L (ref 0–44)
AST: 19 U/L (ref 15–41)
Albumin: 2.4 g/dL — ABNORMAL LOW (ref 3.5–5.0)
Alkaline Phosphatase: 63 U/L (ref 38–126)
Anion gap: 7 (ref 5–15)
BUN: 10 mg/dL (ref 8–23)
CO2: 28 mmol/L (ref 22–32)
Calcium: 7.7 mg/dL — ABNORMAL LOW (ref 8.9–10.3)
Chloride: 98 mmol/L (ref 98–111)
Creatinine, Ser: 0.76 mg/dL (ref 0.61–1.24)
GFR, Estimated: >60 mL/min (ref >60)
Glucose, Bld: 139 mg/dL — ABNORMAL HIGH (ref 70–99)
Potassium: 3.5 mmol/L (ref 3.5–5.1)
Sodium: 133 mmol/L — ABNORMAL LOW (ref 135–145)
Total Bilirubin: 0.6 mg/dL (ref 0.3–1.2)
Total Protein: 5.8 g/dL — ABNORMAL LOW (ref 6.5–8.1)

## 2021-04-08 LAB — DIFFERENTIAL
Abs Immature Granulocytes: 0.27 10*3/uL — ABNORMAL HIGH (ref 0.00–0.07)
Basophils Absolute: 0 10*3/uL (ref 0.0–0.1)
Basophils Relative: 0 %
Eosinophils Absolute: 0.2 10*3/uL (ref 0.0–0.5)
Eosinophils Relative: 1 %
Immature Granulocytes: 2 %
Lymphocytes Relative: 7 %
Lymphs Abs: 1.2 10*3/uL (ref 0.7–4.0)
Monocytes Absolute: 1.3 10*3/uL — ABNORMAL HIGH (ref 0.1–1.0)
Monocytes Relative: 8 %
Neutro Abs: 13.5 10*3/uL — ABNORMAL HIGH (ref 1.7–7.7)
Neutrophils Relative %: 82 %

## 2021-04-08 LAB — TRIGLYCERIDES: Triglycerides: 93 mg/dL (ref <150)

## 2021-04-08 LAB — MAGNESIUM: Magnesium: 2.2 mg/dL (ref 1.7–2.4)

## 2021-04-08 LAB — PHOSPHORUS: Phosphorus: 2.6 mg/dL (ref 2.5–4.6)

## 2021-04-08 LAB — PREALBUMIN: Prealbumin: 5.6 mg/dL — ABNORMAL LOW (ref 18–38)

## 2021-04-08 MED ORDER — KCL IN DEXTROSE-NACL 20-5-0.45 MEQ/L-%-% IV SOLN
INTRAVENOUS | Status: AC
  Filled 2021-04-08: qty 1000

## 2021-04-08 MED ORDER — POTASSIUM CHLORIDE 10 MEQ/100ML IV SOLN
10.0000 meq | INTRAVENOUS | Status: AC
  Administered 2021-04-08 (×4): 10 meq via INTRAVENOUS
  Filled 2021-04-08: qty 100

## 2021-04-08 MED ORDER — TRAVASOL 10 % IV SOLN
INTRAVENOUS | Status: AC
  Filled 2021-04-08: qty 576

## 2021-04-08 NOTE — Progress Notes (Signed)
4 Days Post-Op   Chief Complaint/Subjective: Pain musch improved, +flatus this morning, NG dislodged overnight  Review of Systems See above, otherwise other systems negative   Objective: Vital signs in last 24 hours: Temp:  [98.5 F (36.9 C)-98.7 F (37.1 C)] 98.7 F (37.1 C) (07/01 0603) Pulse Rate:  [98-107] 98 (07/01 0603) Resp:  [17-18] 18 (07/01 0603) BP: (116-130)/(67-78) 116/78 (07/01 0603) SpO2:  [93 %-94 %] 93 % (07/01 0603) Last BM Date: 03/27/21 Intake/Output from previous day: 06/30 0701 - 07/01 0700 In: 32.4 [IV Piggyback:32.4] Out: 1067 [Urine:250; Emesis/NG output:800; Drains:17] Intake/Output this shift: Total I/O In: 1106.7 [P.O.:60; I.V.:921.9; IV Piggyback:124.8] Out: 5 [Drains:5]  PE: Gen: NAd Resp: nonlabored Card: RRR Abd: soft, slightdistension, nontender, incision c/d/i  Lab Results:  Recent Labs    04/07/21 0427 04/08/21 0432  WBC 18.5* 16.6*  HGB 13.7 12.2*  HCT 43.2 38.9*  PLT 328 326   BMET Recent Labs    04/07/21 0427 04/08/21 0432  NA 131* 133*  K 3.7 3.5  CL 98 98  CO2 24 28  GLUCOSE 123* 139*  BUN 9 10  CREATININE 0.78 0.76  CALCIUM 7.8* 7.7*   PT/INR No results for input(s): LABPROT, INR in the last 72 hours. CMP     Component Value Date/Time   NA 133 (L) 04/08/2021 0432   K 3.5 04/08/2021 0432   CL 98 04/08/2021 0432   CO2 28 04/08/2021 0432   GLUCOSE 139 (H) 04/08/2021 0432   BUN 10 04/08/2021 0432   CREATININE 0.76 04/08/2021 0432   CREATININE 0.98 04/17/2017 1049   CALCIUM 7.7 (L) 04/08/2021 0432   PROT 5.8 (L) 04/08/2021 0432   ALBUMIN 2.4 (L) 04/08/2021 0432   AST 19 04/08/2021 0432   ALT 14 04/08/2021 0432   ALKPHOS 63 04/08/2021 0432   BILITOT 0.6 04/08/2021 0432   GFRNONAA >60 04/08/2021 0432   GFRAA >60 09/25/2017 0648   Lipase     Component Value Date/Time   LIPASE 26 04/04/2021 1240    Studies/Results: CT ABDOMEN PELVIS WO CONTRAST  Result Date: 04/07/2021 CLINICAL DATA:   Progressive abdominal pain following umbilical hernia repair EXAM: CT ABDOMEN AND PELVIS WITHOUT CONTRAST TECHNIQUE: Multidetector CT imaging of the abdomen and pelvis was performed following the standard protocol without IV contrast. COMPARISON:  04/04/2021 FINDINGS: Lower chest: Mild bibasilar atelectasis. The visualized heart and pericardium are unremarkable. Hepatobiliary: Vicarious excretion of contrast within the gallbladder lumen. No pericholecystic inflammatory change. Multiple hepatic cysts are again identified. The liver is otherwise unremarkable. No intra or extrahepatic biliary ductal dilation. Pancreas: Unremarkable Spleen: Unremarkable Adrenals/Urinary Tract: Adrenal glands are unremarkable. Kidneys are normal, without renal calculi, focal lesion, or hydronephrosis. Bladder is unremarkable. Stomach/Bowel: Surgical changes of ventral hernia repair are identified with a Penrose drain within the superficial subcutaneous fat of the laparotomy incision as well as a surgical drainage catheter subjacent to the rectus abdominus musculature within the potential space between the investing fascia and the transversus abdominus. Surgical changes of partial small bowel resection are identified. Nasogastric tube tip is seen within the proximal body of the stomach. There has been interval decrease in size of the caliber of dilated small bowel when compared to prior examination, though multiple loops of gas and fluid-filled small bowel are identified extending to the point of a the anastomotic staple line within the right lower quadrant of the abdomen. The small bowel distal to this point is decompressed. Together, the findings are in keeping with a persistent partial  small bowel obstruction at this level or postoperative ileus. There is mesenteric edema involving the small bowel mesentery, most severe in the region of the small bowel anastomosis, possibly postsurgical in nature. Trace ascites. No free intraperitoneal  gas. The appendix and large bowel are unremarkable. Vascular/Lymphatic: Mild aortoiliac atherosclerotic calcification. No aortic aneurysm. No pathologic adenopathy within the abdomen and pelvis. Reproductive: Prostate is unremarkable. Other: Small bilateral fat containing inguinal hernias are present. Musculoskeletal: Degenerative changes are seen within the lumbar spine. No acute bone abnormality. No lytic or blastic bone lesion. IMPRESSION: Interval ventral hernia repair and partial small bowel resection. Extensive mesenteric edema within the small bowel mesentery, particularly in the region of the surgical anastomosis possibly postsurgical in nature. Persistent partial small bowel obstruction at the level of the anastomotic staple line versus postoperative ileus. Aortic Atherosclerosis (ICD10-I70.0). Electronically Signed   By: Helyn Numbers MD   On: 04/07/2021 07:13   DG Abd 2 Views  Result Date: 04/08/2021 CLINICAL DATA:  Follow up small bowel obstruction EXAM: ABDOMEN - 2 VIEW COMPARISON:  04/06/2021 FINDINGS: Scattered large and small bowel gas is noted. Mild persistent small bowel dilatation is noted similar to that seen on the prior exam. Surgical drain is again noted in place. Gastric catheter is no longer identified. IMPRESSION: Overall stable appearance of the abdomen with small bowel dilatation. Electronically Signed   By: Alcide Clever M.D.   On: 04/08/2021 11:19   [place] EKG SITE RITE  Result Date: 04/07/2021 If Site Rite image not attached, placement could not be confirmed due to current cardiac rhythm.   Anti-infectives: Anti-infectives (From admission, onward)    Start     Dose/Rate Route Frequency Ordered Stop   04/07/21 1400  piperacillin-tazobactam (ZOSYN) IVPB 3.375 g        3.375 g 12.5 mL/hr over 240 Minutes Intravenous Every 8 hours 04/07/21 0938     04/05/21 0200  ceFAZolin (ANCEF) IVPB 1 g/50 mL premix  Status:  Discontinued        1 g 100 mL/hr over 30 Minutes Intravenous  Every 8 hours 04/04/21 1948 04/07/21 0938   04/04/21 2200  metroNIDAZOLE (FLAGYL) IVPB 500 mg  Status:  Discontinued        500 mg 100 mL/hr over 60 Minutes Intravenous Every 8 hours 04/04/21 1948 04/07/21 0938   04/04/21 1745  ceFAZolin (ANCEF) IVPB 2g/100 mL premix        2 g 200 mL/hr over 30 Minutes Intravenous  Once 04/04/21 1730 04/04/21 1815   04/04/21 1715  ceFAZolin (ANCEF) 2-4 GM/100ML-% IVPB       Note to Pharmacy: Sharyn Creamer   : cabinet override      04/04/21 1715 04/04/21 1818       Assessment/Plan  s/p Procedure(s): Diagnostic laparotomy, exploratory laparotomy, small bowel resection with mesh 04/04/2021    FEN - NPO with ice chips, TPN VTE - enoxaparin ID - zosyn for intraabdominal contamination Disposition - in hospital, possible clear liquids tomorrow   LOS: 4 days   Rodman Pickle , [facility] 04/08/2021, 1:43 PM Please see Amion for pager number during day hours 7:00am-4:30pm or 7:00am -11:30am on weekends

## 2021-04-08 NOTE — Progress Notes (Addendum)
Mobility Specialist - Progress Note   04/08/21 1423  Mobility  Activity Ambulated in hall  Level of Assistance Contact guard assist, steadying assist  Assistive Device [place];Other (Comment) (IV Pole)  Distance Ambulated (ft) 50 ft  Mobility Ambulated with assistance in hallway  Mobility Response Tolerated well  Mobility performed by Mobility specialist  $Mobility charge 1 Mobility     Pt ambulated 56ft in hallway using cane and pushing IV pole. Pt stated "I feel wobbly" during ambulation and signs of SOB present. Pt took 1 standing rest break during ambulation. Overall pt tolerated ambulation well and returned to chair at end of session with family in room. Pt left with ice chips, water, and ice packs per pt request.   Arliss Journey Mobility Specialist Acute Rehabilitation Services Office: [phone] 04/08/21, 2:29 PM

## 2021-04-08 NOTE — Progress Notes (Signed)
Pt NG tube came out. On call Gerkin paged. Spoke with Gerkin via phone call @ 2212. RN explained to MD that pt prefers to leave tube out but wife is concerned about him getting nauseous again. Pt had about 800 ml in output during the dayshift. MD states to leave tube out for tonight, see new orders for scans in the morning.

## 2021-04-08 NOTE — Progress Notes (Signed)
PHARMACY - TOTAL PARENTERAL NUTRITION CONSULT NOTE   Indication: SBO  Patient Measurements: Height: 5' 11.5" (181.6 cm) Weight: 103 kg (227 lb 1.2 oz) IBW/kg (Calculated) : 76.45 TPN AdjBW (KG): 83.1 Body mass index is 31.23 kg/m.   Assessment: 6/27 Diag > Expl Lap with SBO, mesh repair, peritonitis. No hx DM  Glucose / Insulin: CBDs 114-127, 1 unit insulin from start of TPN Electrolytes: Na remains low, K 3.5, CorrCa 9 Keep K > 4, Mag > 2 Renal: renal fx wnl, UOP not measured Hepatic: LFTs wnl TG: 93 (7/1) Pre-Albumin: 5.6 (7.1) Intake / Output; MIVF: D5 1/2 NS with 20 mEq KCl at 70 ml/hr GI Imaging: GI Surgeries / Procedures:  6/20 repair recurrent ventral hernia w/mesh.  6/27 Diag > Expl Lap with SBO, mesh repair, peritonitis  Central access:  TPN start date: 6/30 planned PICC insertion  Nutritional Goals (per RD recommendation on 6/30): kCal: 2150-2350, Protein: 100-115gm, Fluid: 2.3 L per day  Goal TPN rate is 110 mL/hr (provides  105g of protein and  2300 kcals per day)  Current Nutrition: NPO NG tube for nausea, pulled out, observe   Plan:  KCl 10 mEq runs x4. Pt requested KCl po, but after checking with CCS > cont IV repletion.   At 1800: Increase TPN to 70mL/hr  Electrolytes in TPN: Na incr to 30mEq/L, K incr to 28mEq/L, Ca 65mEq/L, Mg 1mEq/L, and Phos 17mmol/L. Cl:Ac 1:1 Add standard MVI and trace elements to TPN Initiate Sensitive q6h SSI and adjust as needed  Reduce MIVF to 50 mL/hr at 1800 Monitor TPN labs on Mon/Thurs, BMET/Mag/Phos levels in am  Otho Bellows PharmD 04/08/2021,10:54 AM

## 2021-04-09 LAB — CBC
HCT: 36.3 % — ABNORMAL LOW (ref 39.0–52.0)
Hemoglobin: 11.6 g/dL — ABNORMAL LOW (ref 13.0–17.0)
MCH: 27.4 pg (ref 26.0–34.0)
MCHC: 32 g/dL (ref 30.0–36.0)
MCV: 85.6 fL (ref 80.0–100.0)
Platelets: 336 10*3/uL (ref 150–400)
RBC: 4.24 MIL/uL (ref 4.22–5.81)
RDW: 15.2 % (ref 11.5–15.5)
WBC: 14.4 10*3/uL — ABNORMAL HIGH (ref 4.0–10.5)
nRBC: 0 % (ref 0.0–0.2)

## 2021-04-09 LAB — PHOSPHORUS: Phosphorus: 2.7 mg/dL (ref 2.5–4.6)

## 2021-04-09 LAB — BASIC METABOLIC PANEL
Anion gap: 7 (ref 5–15)
BUN: 9 mg/dL (ref 8–23)
CO2: 26 mmol/L (ref 22–32)
Calcium: 7.6 mg/dL — ABNORMAL LOW (ref 8.9–10.3)
Chloride: 99 mmol/L (ref 98–111)
Creatinine, Ser: 0.74 mg/dL (ref 0.61–1.24)
GFR, Estimated: >60 mL/min (ref >60)
Glucose, Bld: 122 mg/dL — ABNORMAL HIGH (ref 70–99)
Potassium: 3.9 mmol/L (ref 3.5–5.1)
Sodium: 132 mmol/L — ABNORMAL LOW (ref 135–145)

## 2021-04-09 LAB — GLUCOSE, CAPILLARY
Glucose-Capillary: 111 mg/dL — ABNORMAL HIGH (ref 70–99)
Glucose-Capillary: 118 mg/dL — ABNORMAL HIGH (ref 70–99)
Glucose-Capillary: 122 mg/dL — ABNORMAL HIGH (ref 70–99)
Glucose-Capillary: 156 mg/dL — ABNORMAL HIGH (ref 70–99)

## 2021-04-09 LAB — MAGNESIUM: Magnesium: 2 mg/dL (ref 1.7–2.4)

## 2021-04-09 MED ORDER — GABAPENTIN 300 MG PO CAPS
300.0000 mg | ORAL_CAPSULE | Freq: Three times a day (TID) | ORAL | Status: DC
  Administered 2021-04-09 – 2021-04-15 (×18): 300 mg via ORAL
  Filled 2021-04-09 (×18): qty 1

## 2021-04-09 MED ORDER — ACETAMINOPHEN 500 MG PO TABS
1000.0000 mg | ORAL_TABLET | Freq: Four times a day (QID) | ORAL | Status: DC
  Administered 2021-04-09 – 2021-04-15 (×19): 1000 mg via ORAL
  Filled 2021-04-09 (×22): qty 2

## 2021-04-09 MED ORDER — HYDROMORPHONE HCL 1 MG/ML IJ SOLN
1.0000 mg | INTRAMUSCULAR | Status: DC | PRN
  Administered 2021-04-10 – 2021-04-12 (×2): 1 mg via INTRAVENOUS
  Administered 2021-04-12 – 2021-04-13 (×2): 2 mg via INTRAVENOUS
  Filled 2021-04-09: qty 1
  Filled 2021-04-09 (×2): qty 2
  Filled 2021-04-09: qty 1

## 2021-04-09 MED ORDER — DEXTROSE-NACL 5-0.45 % IV SOLN: INTRAVENOUS | Status: AC

## 2021-04-09 MED ORDER — METHOCARBAMOL 1000 MG/10ML IJ SOLN
500.0000 mg | Freq: Four times a day (QID) | INTRAVENOUS | Status: DC | PRN
  Administered 2021-04-11 – 2021-04-13 (×2): 500 mg via INTRAVENOUS
  Filled 2021-04-09: qty 5
  Filled 2021-04-09 (×2): qty 500

## 2021-04-09 MED ORDER — TRAVASOL 10 % IV SOLN
INTRAVENOUS | Status: AC
  Filled 2021-04-09: qty 768

## 2021-04-09 NOTE — Progress Notes (Signed)
5 Days Post-Op   Subjective/Chief Complaint: Pain control has been tough. Denies nausea. Endorse flatus.    Objective: Vital signs in last 24 hours: Temp:  [98 F (36.7 C)-98.4 F (36.9 C)] 98.4 F (36.9 C) (07/02 0551) Pulse Rate:  [89-98] 91 (07/02 0551) Resp:  [17-20] 20 (07/02 0551) BP: (119-136)/(66-80) 130/74 (07/02 0551) SpO2:  [94 %-97 %] 94 % (07/02 0551) Last BM Date: 03/27/21  Intake/Output from previous day: 07/01 0701 - 07/02 0700 In: 2114.8 [P.O.:300; I.V.:1245.6; IV Piggyback:569.1] Out: 543 [Urine:525; Drains:18] Intake/Output this shift: No intake/output data recorded.  Alert, appears comfortable Unlabored respirations Abd soft, distended, JP output serous. Incision cdi some blood staining on lower honeycomb  Lab Results:  Recent Labs    04/08/21 0432 04/09/21 0307  WBC 16.6* 14.4*  HGB 12.2* 11.6*  HCT 38.9* 36.3*  PLT 326 336   BMET Recent Labs    04/08/21 0432 04/09/21 0307  NA 133* 132*  K 3.5 3.9  CL 98 99  CO2 28 26  GLUCOSE 139* 122*  BUN 10 9  CREATININE 0.76 0.74  CALCIUM 7.7* 7.6*   PT/INR No results for input(s): LABPROT, INR in the last 72 hours. ABG No results for input(s): PHART, HCO3 in the last 72 hours.  Invalid input(s): PCO2, PO2  Studies/Results: DG Abd 2 Views  Result Date: 04/08/2021 CLINICAL DATA:  Follow up small bowel obstruction EXAM: ABDOMEN - 2 VIEW COMPARISON:  04/06/2021 FINDINGS: Scattered large and small bowel gas is noted. Mild persistent small bowel dilatation is noted similar to that seen on the prior exam. Surgical drain is again noted in place. Gastric catheter is no longer identified. IMPRESSION: Overall stable appearance of the abdomen with small bowel dilatation. Electronically Signed   By: Alcide Clever M.D.   On: 04/08/2021 11:19   [place] EKG SITE RITE  Result Date: 04/07/2021 If Site Rite image not attached, placement could not be confirmed due to current cardiac rhythm.    Anti-infectives: Anti-infectives (From admission, onward)    Start     Dose/Rate Route Frequency Ordered Stop   04/07/21 1400  piperacillin-tazobactam (ZOSYN) IVPB 3.375 g        3.375 g 12.5 mL/hr over 240 Minutes Intravenous Every 8 hours 04/07/21 0938     04/05/21 0200  ceFAZolin (ANCEF) IVPB 1 g/50 mL premix  Status:  Discontinued        1 g 100 mL/hr over 30 Minutes Intravenous Every 8 hours 04/04/21 1948 04/07/21 0938   04/04/21 2200  metroNIDAZOLE (FLAGYL) IVPB 500 mg  Status:  Discontinued        500 mg 100 mL/hr over 60 Minutes Intravenous Every 8 hours 04/04/21 1948 04/07/21 0938   04/04/21 1745  ceFAZolin (ANCEF) IVPB 2g/100 mL premix        2 g 200 mL/hr over 30 Minutes Intravenous  Once 04/04/21 1730 04/04/21 1815   04/04/21 1715  ceFAZolin (ANCEF) 2-4 GM/100ML-% IVPB       Note to Pharmacy: Sharyn Creamer   : cabinet override      04/04/21 1715 04/04/21 1818       Assessment/Plan: s/p Procedure(s): Diagnostic laparotomy, exploratory laparotomy, small bowel resection with mesh (N/A)  -Sips of clears -Add gabapentin and robaxin, increase pain meds -Mobilize as able -Await further bowel function   LOS: 5 days    [name] 04/09/2021

## 2021-04-09 NOTE — Progress Notes (Signed)
PHARMACY - TOTAL PARENTERAL NUTRITION CONSULT NOTE   Indication: SBO  Patient Measurements: Height: 5' 11.5" (181.6 cm) Weight: 103 kg (227 lb 1.2 oz) IBW/kg (Calculated) : 76.45 TPN AdjBW (KG): 83.1 Body mass index is 31.23 kg/m.   Assessment: 6/27 Diag > Expl Lap with SBO, mesh repair, peritonitis. Pharmacy consulted to manage TPN.  Glucose / Insulin: No hx DM. CBG goal <180. -CBG range: 99-156; 4 units SSI used in past 24 hrs Electrolytes: Na (132) remains low; all other lytes WNL including CorrCa (8.9) Goal: K >/= 4, Mag >/= 2 Renal: SCr, BUN WNL and stable Hepatic: LFTs wnl TG: 93 (7/1) Pre-Albumin: 5.6 (7.1); Alb 2.4 - both low Intake / Output; MIVF: D5 1/2 NS with 20 mEq KCl at 50 mL/hr -I/O Net: +1571 mL; UOP 525 mL; drains: 18 mL GI Imaging: -6/30 CT Abd: persistent SBO vs post-op ileus GI Surgeries / Procedures:   -6/20 repair recurrent ventral hernia w/mesh.  -6/27 Ex lap, small bowel resection w/mesh  Central access: Double lumen PICC TPN start date: 6/30  Nutritional Goals (per RD recommendation on 6/30): kCal: 2150-2350, Protein: 100-115gm, Fluid: 2.3 L per day   Goal TPN rate is 110 mL/hr (provides  105g of protein and  2300 kcals per day)  Current Nutrition: TPN, sips of clears  Plan:   At 1800: Increase TPN to 80 mL/hr  Provides 77 g protein, 288 g dextrose, 1670 kcal Electrolytes in TPN: Increase Na, Decrease K to standard concentration Na 110 mEq/L K 50 mEq/L Ca 64mEq/L Mg 25mEq/L Phos 66mmol/L Cl:Ac 1:1 Add standard MVI and trace elements to TPN Continue Sensitive q6h SSI and CBG checks; adjust as needed  Reduce MIVF to 30 mL/hr. Remove KCl from MIVF Monitor TPN labs on Mon/Thurs, recheck electrolytes with AM labs tomorrow  Cindi Carbon PharmD 04/09/2021,9:52 AM

## 2021-04-10 LAB — BASIC METABOLIC PANEL
Anion gap: 6 (ref 5–15)
BUN: 8 mg/dL (ref 8–23)
CO2: 27 mmol/L (ref 22–32)
Calcium: 8.1 mg/dL — ABNORMAL LOW (ref 8.9–10.3)
Chloride: 104 mmol/L (ref 98–111)
Creatinine, Ser: 0.65 mg/dL (ref 0.61–1.24)
GFR, Estimated: >60 mL/min (ref >60)
Glucose, Bld: 131 mg/dL — ABNORMAL HIGH (ref 70–99)
Potassium: 4.2 mmol/L (ref 3.5–5.1)
Sodium: 137 mmol/L (ref 135–145)

## 2021-04-10 LAB — CBC
HCT: 36.4 % — ABNORMAL LOW (ref 39.0–52.0)
Hemoglobin: 11.4 g/dL — ABNORMAL LOW (ref 13.0–17.0)
MCH: 26.9 pg (ref 26.0–34.0)
MCHC: 31.3 g/dL (ref 30.0–36.0)
MCV: 85.8 fL (ref 80.0–100.0)
Platelets: 355 10*3/uL (ref 150–400)
RBC: 4.24 MIL/uL (ref 4.22–5.81)
RDW: 15.6 % — ABNORMAL HIGH (ref 11.5–15.5)
WBC: 13.5 10*3/uL — ABNORMAL HIGH (ref 4.0–10.5)
nRBC: 0 % (ref 0.0–0.2)

## 2021-04-10 LAB — GLUCOSE, CAPILLARY
Glucose-Capillary: 108 mg/dL — ABNORMAL HIGH (ref 70–99)
Glucose-Capillary: 123 mg/dL — ABNORMAL HIGH (ref 70–99)
Glucose-Capillary: 137 mg/dL — ABNORMAL HIGH (ref 70–99)
Glucose-Capillary: 138 mg/dL — ABNORMAL HIGH (ref 70–99)

## 2021-04-10 LAB — PHOSPHORUS: Phosphorus: 3.8 mg/dL (ref 2.5–4.6)

## 2021-04-10 LAB — MAGNESIUM: Magnesium: 2.1 mg/dL (ref 1.7–2.4)

## 2021-04-10 MED ORDER — DEXTROSE-NACL 5-0.45 % IV SOLN: INTRAVENOUS | Status: DC

## 2021-04-10 MED ORDER — TRAVASOL 10 % IV SOLN
INTRAVENOUS | Status: AC
  Filled 2021-04-10: qty 1104

## 2021-04-10 NOTE — Progress Notes (Signed)
6 Days Post-Op   Subjective/Chief Complaint: Passing flatus, denies BM yet. No n/v. Feels well, distention better each day. Tolerating sips of liquids without any trouble and has drank whole cups of water at a time   Objective: Vital signs in last 24 hours: Temp:  [98.2 F (36.8 C)] 98.2 F (36.8 C) (07/03 0601) Pulse Rate:  [82-90] 90 (07/03 0601) Resp:  [17-18] 18 (07/03 0601) BP: (127-142)/(70-82) 131/82 (07/03 0601) SpO2:  [95 %-98 %] 95 % (07/03 0601) Last BM Date: 03/27/21  Intake/Output from previous day: 07/02 0701 - 07/03 0700 In: 2543.2 [P.O.:177; I.V.:2216.1; IV Piggyback:150.1] Out: 628 [Urine:550; Drains:78] Intake/Output this shift: Total I/O In: 0  Out: 5 [Drains:5]  Alert, appears comfortable Unlabored respirations Abd soft, distended, JP output serous. Incision cdi some blood staining on lower honeycomb  Lab Results:  Recent Labs    04/09/21 0307 04/10/21 0318  WBC 14.4* 13.5*  HGB 11.6* 11.4*  HCT 36.3* 36.4*  PLT 336 355   BMET Recent Labs    04/09/21 0307 04/10/21 0318  NA 132* 137  K 3.9 4.2  CL 99 104  CO2 26 27  GLUCOSE 122* 131*  BUN 9 8  CREATININE 0.74 0.65  CALCIUM 7.6* 8.1*   PT/INR No results for input(s): LABPROT, INR in the last 72 hours. ABG No results for input(s): PHART, HCO3 in the last 72 hours.  Invalid input(s): PCO2, PO2  Studies/Results: No results found.  Anti-infectives: Anti-infectives (From admission, onward)    Start     Dose/Rate Route Frequency Ordered Stop   04/07/21 1400  piperacillin-tazobactam (ZOSYN) IVPB 3.375 g        3.375 g 12.5 mL/hr over 240 Minutes Intravenous Every 8 hours 04/07/21 0938     04/05/21 0200  ceFAZolin (ANCEF) IVPB 1 g/50 mL premix  Status:  Discontinued        1 g 100 mL/hr over 30 Minutes Intravenous Every 8 hours 04/04/21 1948 04/07/21 0938   04/04/21 2200  metroNIDAZOLE (FLAGYL) IVPB 500 mg  Status:  Discontinued        500 mg 100 mL/hr over 60 Minutes Intravenous  Every 8 hours 04/04/21 1948 04/07/21 0938   04/04/21 1745  ceFAZolin (ANCEF) IVPB 2g/100 mL premix        2 g 200 mL/hr over 30 Minutes Intravenous  Once 04/04/21 1730 04/04/21 1815   04/04/21 1715  ceFAZolin (ANCEF) 2-4 GM/100ML-% IVPB       Note to Pharmacy: Sharyn Creamer   : cabinet override      04/04/21 1715 04/04/21 1818       Assessment/Plan: s/p Procedure(s): Diagnostic laparotomy, exploratory laparotomy, small bowel resection with mesh (N/A)  -Clear liquids -Cont gabapentin and robaxin; analgesics -Mobilize as able - ordering PT -Await further bowel function   LOS: 6 days    Andria Meuse 04/10/2021

## 2021-04-10 NOTE — Progress Notes (Signed)
PHARMACY - TOTAL PARENTERAL NUTRITION CONSULT NOTE   Indication: SBO  Patient Measurements: Height: 5' 11.5" (181.6 cm) Weight: 103 kg (227 lb 1.2 oz) IBW/kg (Calculated) : 76.45 TPN AdjBW (KG): 83.1 Body mass index is 31.23 kg/m.   Assessment: 6/27 Diag > Expl Lap with SBO, mesh repair, peritonitis. Pharmacy consulted to manage TPN.  Glucose / Insulin: No hx DM. CBG goal <180. -CBG range: 111-138; 2 units SSI used in past 24 hrs Electrolytes: All lytes WNL including CorrCa (9.4) Goal: K >/= 4, Mag >/= 2 Renal: SCr, BUN WNL and stable Hepatic: LFTs wnl TG: 93 (7/1) Pre-Albumin: 5.6 (7/1); Alb 2.4 - both low Intake / Output; MIVF: Strict I/O not measured MIVF: D5 1/2 NS at 30 mL/hr -UOP unmeasured; drains: 50 mL GI Imaging: -6/30 CT Abd: persistent SBO vs post-op ileus GI Surgeries / Procedures:   -6/20 repair recurrent ventral hernia w/mesh.  -6/27 Ex lap, small bowel resection w/mesh  Central access: Double lumen PICC TPN start date: 6/30  Nutritional Goals (per RD recommendation on 6/30): kCal: 2150-2350, Protein: 100-115gm, Fluid: 2.3 L per day   Goal TPN rate is 100 mL/hr (provides 110 g of protein, 360 g dextrose, and 2194 kcals per day)  Current Nutrition: TPN, sips of clears  Plan:   At 1800: Increase TPN to goal rate of 100 mL/hr  Electrolytes in TPN: Decrease K, Phos, Ca Na 110 mEq/L K 40 mEq/L Ca 4 mEq/L Mg 38mEq/L Phos 12 mmol/L Cl:Ac 1:1 Add standard MVI and trace elements to TPN Continue Sensitive q6h SSI and CBG checks; adjust as needed  Reduce MIVF to [facility] Monitor TPN labs on Mon/Thurs  Cindi Carbon PharmD 04/10/2021,8:47 AM

## 2021-04-11 LAB — CBC
HCT: 36.6 % — ABNORMAL LOW (ref 39.0–52.0)
Hemoglobin: 11.1 g/dL — ABNORMAL LOW (ref 13.0–17.0)
MCH: 26.1 pg (ref 26.0–34.0)
MCHC: 30.3 g/dL (ref 30.0–36.0)
MCV: 85.9 fL (ref 80.0–100.0)
Platelets: 394 K/uL (ref 150–400)
RBC: 4.26 MIL/uL (ref 4.22–5.81)
RDW: 15.6 % — ABNORMAL HIGH (ref 11.5–15.5)
WBC: 13.7 K/uL — ABNORMAL HIGH (ref 4.0–10.5)
nRBC: 0 % (ref 0.0–0.2)

## 2021-04-11 LAB — COMPREHENSIVE METABOLIC PANEL
ALT: 12 U/L (ref 0–44)
AST: 17 U/L (ref 15–41)
Albumin: 2.4 g/dL — ABNORMAL LOW (ref 3.5–5.0)
Alkaline Phosphatase: 62 U/L (ref 38–126)
Anion gap: 6 (ref 5–15)
BUN: 10 mg/dL (ref 8–23)
CO2: 26 mmol/L (ref 22–32)
Calcium: 8.2 mg/dL — ABNORMAL LOW (ref 8.9–10.3)
Chloride: 105 mmol/L (ref 98–111)
Creatinine, Ser: 0.62 mg/dL (ref 0.61–1.24)
GFR, Estimated: >60 mL/min (ref >60)
Glucose, Bld: 119 mg/dL — ABNORMAL HIGH (ref 70–99)
Potassium: 4.4 mmol/L (ref 3.5–5.1)
Sodium: 137 mmol/L (ref 135–145)
Total Bilirubin: 0.4 mg/dL (ref 0.3–1.2)
Total Protein: 6.1 g/dL — ABNORMAL LOW (ref 6.5–8.1)

## 2021-04-11 LAB — DIFFERENTIAL
Abs Immature Granulocytes: 0.7 10*3/uL — ABNORMAL HIGH (ref 0.00–0.07)
Basophils Absolute: 0.1 10*3/uL (ref 0.0–0.1)
Basophils Relative: 1 %
Eosinophils Absolute: 0.4 10*3/uL (ref 0.0–0.5)
Eosinophils Relative: 3 %
Immature Granulocytes: 5 %
Lymphocytes Relative: 11 %
Lymphs Abs: 1.5 10*3/uL (ref 0.7–4.0)
Monocytes Absolute: 1.2 10*3/uL — ABNORMAL HIGH (ref 0.1–1.0)
Monocytes Relative: 9 %
Neutro Abs: 9.8 10*3/uL — ABNORMAL HIGH (ref 1.7–7.7)
Neutrophils Relative %: 71 %

## 2021-04-11 LAB — TRIGLYCERIDES: Triglycerides: 106 mg/dL

## 2021-04-11 LAB — GLUCOSE, CAPILLARY
Glucose-Capillary: 118 mg/dL — ABNORMAL HIGH (ref 70–99)
Glucose-Capillary: 141 mg/dL — ABNORMAL HIGH (ref 70–99)
Glucose-Capillary: 110 mg/dL — ABNORMAL HIGH (ref 70–99)

## 2021-04-11 LAB — PREALBUMIN: Prealbumin: 9.9 mg/dL — ABNORMAL LOW (ref 18–38)

## 2021-04-11 LAB — MAGNESIUM: Magnesium: 2.2 mg/dL (ref 1.7–2.4)

## 2021-04-11 LAB — PHOSPHORUS: Phosphorus: 3.6 mg/dL (ref 2.5–4.6)

## 2021-04-11 MED ORDER — ALUM & MAG HYDROXIDE-SIMETH 200-200-20 MG/5ML PO SUSP
30.0000 mL | ORAL | Status: DC | PRN
  Administered 2021-04-11: 30 mL via ORAL
  Filled 2021-04-11: qty 30

## 2021-04-11 MED ORDER — TRACE MINERALS CU-MN-SE-ZN 300-55-60-3000 MCG/ML IV SOLN
INTRAVENOUS | Status: AC
  Filled 2021-04-11: qty 1104

## 2021-04-11 NOTE — Care Management Important Message (Signed)
Important Message  Patient Details IM Letter given to the Patient. Name: Brian Atkinson MRN: [id number] Date of Birth: [birth year]   Medicare Important Message Given:  Yes     Caren Macadam 04/11/2021, 11:57 AM

## 2021-04-11 NOTE — Progress Notes (Signed)
7 Days Post-Op   Subjective/Chief Complaint: Feeling really well today.  He has walked the halls several times, tolerated a large amount of clear liquids without feeling distended, nauseated or any discomfort.  Continues to have a lot of flatus but no bowel movement yet.  Pain control much improved.   Objective: Vital signs in last 24 hours: Temp:  [98.1 F (36.7 C)-98.6 F (37 C)] 98.6 F (37 C) (07/04 0617) Pulse Rate:  [85-88] 85 (07/04 0617) Resp:  [16-18] 18 (07/04 0617) BP: (117-142)/(73-87) 117/73 (07/04 0617) SpO2:  [94 %-97 %] 94 % (07/04 0617) Last BM Date: 03/27/21  Intake/Output from previous day: 07/03 0701 - 07/04 0700 In: 3327.5 [P.O.:1200; I.V.:1923.7; IV Piggyback:203.8] Out: 3760 [Urine:3650; Drains:110] Intake/Output this shift: Total I/O In: 1817.5 [P.O.:480; I.V.:1233.7; IV Piggyback:103.8] Out: 2000 [Urine:1900; Drains:100]  Alert, appears comfortable Unlabored respirations Abd soft, distended, JP output serous. Incision cdi some blood staining on lower honeycomb  Lab Results:  Recent Labs    04/10/21 0318 04/11/21 0302  WBC 13.5* 13.7*  HGB 11.4* 11.1*  HCT 36.4* 36.6*  PLT 355 394    BMET Recent Labs    04/10/21 0318 04/11/21 0302  NA 137 137  K 4.2 4.4  CL 104 105  CO2 27 26  GLUCOSE 131* 119*  BUN 8 10  CREATININE 0.65 0.62  CALCIUM 8.1* 8.2*    PT/INR No results for input(s): LABPROT, INR in the last 72 hours. ABG No results for input(s): PHART, HCO3 in the last 72 hours.  Invalid input(s): PCO2, PO2  Studies/Results: No results found.  Anti-infectives: Anti-infectives (From admission, onward)    Start     Dose/Rate Route Frequency Ordered Stop   04/07/21 1400  piperacillin-tazobactam (ZOSYN) IVPB 3.375 g        3.375 g 12.5 mL/hr over 240 Minutes Intravenous Every 8 hours 04/07/21 0938     04/05/21 0200  ceFAZolin (ANCEF) IVPB 1 g/50 mL premix  Status:  Discontinued        1 g 100 mL/hr over 30 Minutes Intravenous  Every 8 hours 04/04/21 1948 04/07/21 0938   04/04/21 2200  metroNIDAZOLE (FLAGYL) IVPB 500 mg  Status:  Discontinued        500 mg 100 mL/hr over 60 Minutes Intravenous Every 8 hours 04/04/21 1948 04/07/21 0938   04/04/21 1745  ceFAZolin (ANCEF) IVPB 2g/100 mL premix        2 g 200 mL/hr over 30 Minutes Intravenous  Once 04/04/21 1730 04/04/21 1815   04/04/21 1715  ceFAZolin (ANCEF) 2-4 GM/100ML-% IVPB       Note to Pharmacy: Brian Atkinson   : cabinet override      04/04/21 1715 04/04/21 1818       Assessment/Plan: s/p Procedure(s): Diagnostic laparotomy, exploratory laparotomy, small bowel resection with mesh (N/A)  -Full liquids -Cont gabapentin and robaxin; analgesics -Mobilize as able  -Await further bowel function   LOS: 7 days    Brian Atkinson 04/11/2021

## 2021-04-11 NOTE — Progress Notes (Signed)
PHARMACY - TOTAL PARENTERAL NUTRITION CONSULT NOTE   Indication: SBO  Patient Measurements: Height: 5' 11.5" (181.6 cm) Weight: 103 kg (227 lb 1.2 oz) IBW/kg (Calculated) : 76.45 TPN AdjBW (KG): 83.1 Body mass index is 31.23 kg/m.   Assessment: 6/27 Diag > Expl Lap with SBO, mesh repair, peritonitis. Pharmacy consulted to manage TPN.  Glucose / Insulin: No hx DM. CBG goal <180. -CBG range: 108-137; 2 units SSI used in past 24 hrs Electrolytes: All lytes WNL including CorrCa (9.4) Goal: K >/= 4, Mag >/= 2 Renal: SCr, BUN WNL and stable Hepatic: LFTs wnl TG: 93 (7/1), 106 (7/4) Pre-Albumin: 5.6 (7/1), inc to 9.9 (7/4) Intake / Output; MIVF: Strict I/O not measured MIVF: D5 1/2 NS at 10 mL/hr -UOP 3650 mL; drains 120 mL GI Imaging: -6/30 CT Abd: persistent SBO vs post-op ileus GI Surgeries / Procedures:   -6/20 repair recurrent ventral hernia w/mesh.  -6/27 Ex lap, small bowel resection w/mesh  Central access: Double lumen PICC TPN start date: 6/30  Nutritional Goals (per RD recommendation on 6/30): kCal: 2150-2350, Protein: 100-115gm, Fluid: 2.3 L per day   Goal TPN rate is 100 mL/hr (provides 110 g of protein, 360 g dextrose, and 2194 kcals per day)  Current Nutrition: TPN 7/4 tolerated CLD and advanced to FLD today.  Plan:  At 1800: Continue TPN at goal rate of 100 mL/hr  Electrolytes in TPN: Na 110 mEq/L K 40 mEq/L Ca 4 mEq/L Mg 31mEq/L Phos 12 mmol/L Cl:Ac 1:1 Add standard MVI and trace elements to TPN Continue Sensitive q6h SSI and CBG checks; adjust as needed  Continue M[facility]F at [facility] Monitor TPN labs on Mon/Thurs Follow up oral intake of advanced diet.  Consider weaning TPN when tolerating > 60% of diet from PO intake.     Lynann Beaver PharmD, BCPS Clinical Pharmacist [facility] main pharmacy [phone] 04/11/2021 8:47 AM

## 2021-04-11 NOTE — Evaluation (Signed)
Physical Therapy Evaluation-1x Patient Details Name: Brian Atkinson MRN: [id number] DOB: [birth year] Today's Date: 04/11/2021   History of Present Illness  70 yo male admitted with hernia. S/P ex lap SB resection with mesh 04/04/21. Hx of ORIFL tibia 2021, ORIF L patella 2015  Clinical Impression  On eval, pt was Mod Ind with mobility. He walked ~400 feet with use of RW. Pain was controlled with activity. Pt reports he has been walking 2x/day-encouraged him to continue this as tolerated. No acute PT needs. 1x eval. Will sign off.     Follow Up Recommendations No PT follow up    Equipment Recommendations  None recommended by PT    Recommendations for Other Services       Precautions / Restrictions Precautions Precautions: Fall Precaution Comments: L drain; abd surgery Restrictions Weight Bearing Restrictions: No      Mobility  Bed Mobility Overal bed mobility: Modified Independent             General bed mobility comments: HOB elevated    Transfers Overall transfer level: Modified independent                  Ambulation/Gait Ambulation/Gait assistance: Modified independent (Device/Increase time) Gait Distance (Feet): 400 Feet Assistive device: Rolling walker (2 wheeled) Gait Pattern/deviations: Step-through pattern     General Gait Details: No LOB with RW. Pt feels walker is helpful.  Stairs            Wheelchair Mobility    Modified Rankin (Stroke Patients Only)       Balance Overall balance assessment: Mild deficits observed, not formally tested                                           Pertinent Vitals/Pain Pain Assessment: 0-10 Pain Score: 3  Pain Location: abdomen Pain Descriptors / Indicators: Discomfort;Sore;Tightness Pain Intervention(s): Monitored during session;Repositioned    Home Living Family/patient expects to be discharged to:: Private residence Living Arrangements: Spouse/significant  other;Children Available Help at Discharge: Family;Available 24 hours/day Type of Home: House Home Access: Level entry     Home Layout: Able to live on main level with bedroom/bathroom;Multi-level;Full bath on main level Home Equipment: Walker - 2 wheels;Cane - single point;Crutches;Bedside commode;Wheelchair - manual Additional Comments: states he has a wheelchair ordered from [employer]    Prior Function Level of Independence: Independent with assistive device(s)               Hand Dominance        Extremity/Trunk Assessment   Upper Extremity Assessment Upper Extremity Assessment: Overall WFL for tasks assessed    Lower Extremity Assessment Lower Extremity Assessment: Generalized weakness    Cervical / Trunk Assessment Cervical / Trunk Assessment: Normal  Communication   Communication: No difficulties  Cognition Arousal/Alertness: Awake/alert Behavior During Therapy: WFL for tasks assessed/performed Overall Cognitive Status: Within Functional Limits for tasks assessed                                        General Comments      Exercises     Assessment/Plan    PT Assessment Patent does not need any further PT services  PT Problem List         PT Treatment Interventions  PT Goals (Current goals can be found in the Care Plan section)  Acute Rehab PT Goals Patient Stated Goal: regain PLOF. home soon PT Goal Formulation: All assessment and education complete, DC therapy    Frequency     Barriers to discharge        Co-evaluation               AM-PAC PT "6 Clicks" Mobility  Outcome Measure Help needed turning from your back to your side while in a flat bed without using bedrails?: None Help needed moving from lying on your back to sitting on the side of a flat bed without using bedrails?: A Little Help needed moving to and from a bed to a chair (including a wheelchair)?: None Help needed standing up from a chair using your  arms (e.g., wheelchair or bedside chair)?: None Help needed to walk in hospital room?: None Help needed climbing 3-5 steps with a railing? : None 6 Click Score: 23    End of Session   Activity Tolerance: Patient tolerated treatment well Patient left: in chair;with call bell/phone within reach;with family/visitor present        Time: 1438-8875 PT Time Calculation (min) (ACUTE ONLY): 18 min   Charges:   PT Evaluation $PT Eval Low Complexity: 1 Low             Faye Ramsay, PT Acute Rehabilitation  Office: [phone] Pager: [phone]

## 2021-04-12 ENCOUNTER — Inpatient Hospital Stay (HOSPITAL_COMMUNITY): Payer: Medicare Other

## 2021-04-12 LAB — GLUCOSE, CAPILLARY
Glucose-Capillary: 112 mg/dL — ABNORMAL HIGH (ref 70–99)
Glucose-Capillary: 114 mg/dL — ABNORMAL HIGH (ref 70–99)
Glucose-Capillary: 116 mg/dL — ABNORMAL HIGH (ref 70–99)
Glucose-Capillary: 125 mg/dL — ABNORMAL HIGH (ref 70–99)
Glucose-Capillary: 138 mg/dL — ABNORMAL HIGH (ref 70–99)

## 2021-04-12 LAB — CBC
HCT: 39.2 % (ref 39.0–52.0)
Hemoglobin: 12.1 g/dL — ABNORMAL LOW (ref 13.0–17.0)
MCH: 26.7 pg (ref 26.0–34.0)
MCHC: 30.9 g/dL (ref 30.0–36.0)
MCV: 86.3 fL (ref 80.0–100.0)
Platelets: 457 10*3/uL — ABNORMAL HIGH (ref 150–400)
RBC: 4.54 MIL/uL (ref 4.22–5.81)
RDW: 15.4 % (ref 11.5–15.5)
WBC: 17.4 10*3/uL — ABNORMAL HIGH (ref 4.0–10.5)
nRBC: 0 % (ref 0.0–0.2)

## 2021-04-12 MED ORDER — INSULIN ASPART 100 UNIT/ML IJ SOLN
0.0000 [IU] | Freq: Three times a day (TID) | INTRAMUSCULAR | Status: DC
  Administered 2021-04-12: 1 [IU] via SUBCUTANEOUS

## 2021-04-12 MED ORDER — TRAVASOL 10 % IV SOLN
INTRAVENOUS | Status: AC
  Filled 2021-04-12: qty 1104

## 2021-04-12 MED ORDER — SODIUM CHLORIDE (PF) 0.9 % IJ SOLN
INTRAMUSCULAR | Status: AC
  Filled 2021-04-12: qty 50

## 2021-04-12 MED ORDER — BISACODYL 10 MG RE SUPP
10.0000 mg | RECTAL | Status: AC
  Filled 2021-04-12: qty 1

## 2021-04-12 MED ORDER — IOHEXOL 9 MG/ML PO SOLN
ORAL | Status: AC
  Filled 2021-04-12: qty 1000

## 2021-04-12 MED ORDER — IOHEXOL 300 MG/ML  SOLN
100.0000 mL | Freq: Once | INTRAMUSCULAR | Status: AC | PRN
  Administered 2021-04-12: 100 mL via INTRAVENOUS

## 2021-04-12 MED ORDER — INSULIN ASPART 100 UNIT/ML IJ SOLN: 0.0000 [IU] | Freq: Every day | INTRAMUSCULAR | Status: DC

## 2021-04-12 MED ORDER — IOHEXOL 9 MG/ML PO SOLN
500.0000 mL | ORAL | Status: AC
  Administered 2021-04-12 (×2): 500 mL via ORAL

## 2021-04-12 NOTE — Progress Notes (Signed)
PHARMACY - TOTAL PARENTERAL NUTRITION CONSULT NOTE   Indication: SBO  Patient Measurements: Height: 5' 11.5" (181.6 cm) Weight: 103 kg (227 lb 1.2 oz) IBW/kg (Calculated) : 76.45 TPN AdjBW (KG): 83.1 Body mass index is 31.23 kg/m.   Assessment: 6/27 Diag > Expl Lap with SBO, mesh repair, peritonitis. Pharmacy consulted to manage TPN.  Glucose / Insulin: No hx DM. CBG goal <180. -CBG range: 1010-141; 3 units SSI used in past 24 hrs Electrolytes: All lytes WNL including CorrCa (9.4) Goal: K >/= 4, Mag >/= 2 Renal: SCr, BUN WNL and stable Hepatic: LFTs wnl TG: 93 (7/1), 106 (7/4) Pre-Albumin: 5.6 (7/1), inc to 9.9 (7/4) Intake / Output; MIVF: Strict I/O not measured MIVF: D5 1/2 NS at 10 mL/hr -UOP 3650 mL; drains 120 mL GI Imaging: -6/30 CT Abd: persistent SBO vs post-op ileus GI Surgeries / Procedures:   -6/20 repair recurrent ventral hernia w/mesh.  -6/27 Ex lap, small bowel resection w/mesh  Central access: Double lumen PICC TPN start date: 6/30  Nutritional Goals (per RD recommendation on 6/30): kCal: 2150-2350, Protein: 100-115gm, Fluid: 2.3 L per day   Goal TPN rate is 100 mL/hr (provides 110 g of protein, 360 g dextrose, and 2194 kcals per day)  Current Nutrition: TPN 7/4 tolerated CLD and advanced to FLD today.  Plan:  At 1800: Continue TPN at goal rate of 100 mL/hr  Electrolytes in TPN: Na 110 mEq/L K 40 mEq/L Ca 4 mEq/L Mg 47mEq/L Phos 12 mmol/L Cl:Ac 1:1 Add standard MVI and trace elements to TPN Continue Sensitive q6h SSI and CBG checks; change to ACHS Continue M[facility] at [facility] Monitor TPN labs on Mon/Thurs and as needed Follow up oral intake of advanced diet.  Consider weaning TPN when tolerating > 60% of diet from PO intake.     Hessie Knows, PharmD, BCPS Secure Chat if ?s 04/12/2021 10:16 AM

## 2021-04-12 NOTE — Progress Notes (Signed)
     Assessment & Plan: POD#15/8 status post retromuscular recurrent incisional hernia repair with vicryl mesh, reconstruction of the posterior layer and Phasix absorbable retromuscular mesh, small bowel resection - Dr. Dossie Der 04-04-21  Full liquid diet, TNA  Ambulating, wife at bedside  Frustrated with no progress  Will give suppository today        [name], MD       [facility]       Office: [phone]   Chief Complaint: Ventral hernia recurrence  Subjective: Patient in bed, wife at bedside  Objective: Vital signs in last 24 hours: Temp:  [98.4 F (36.9 C)-98.8 F (37.1 C)] 98.4 F (36.9 C) (07/05 0558) Pulse Rate:  [76-108] 76 (07/05 0558) Resp:  [16-18] 16 (07/05 0558) BP: (118-137)/(75-80) 137/77 (07/05 0558) SpO2:  [96 %-97 %] 96 % (07/05 0558) Last BM Date: 03/27/21  Intake/Output from previous day: 07/04 0701 - 07/05 0700 In: 3437.4 [P.O.:960; I.V.:2231.3; IV Piggyback:246.2] Out: 1160 [Urine:1150; Drains:10] Intake/Output this shift: Total I/O In: -  Out: 850 [Urine:850]  Physical Exam: HEENT - sclerae clear, mucous membranes moist Neck - soft Chest - clear bilaterally Cor - RRR Abdomen - soft, mild distension; few BS on auscultation; dressings dry; drain with thin serosanguinous output Ext - no edema, non-tender Neuro - alert & oriented, no focal deficits  Lab Results:  Recent Labs    04/10/21 0318 04/11/21 0302  WBC 13.5* 13.7*  HGB 11.4* 11.1*  HCT 36.4* 36.6*  PLT 355 394   BMET Recent Labs    04/10/21 0318 04/11/21 0302  NA 137 137  K 4.2 4.4  CL 104 105  CO2 27 26  GLUCOSE 131* 119*  BUN 8 10  CREATININE 0.65 0.62  CALCIUM 8.1* 8.2*   PT/INR No results for input(s): LABPROT, INR in the last 72 hours. Comprehensive Metabolic Panel:    Component Value Date/Time   NA 137 04/11/2021 0302   NA 137 04/10/2021 0318   K 4.4 04/11/2021 0302   K 4.2 04/10/2021 0318   CL 105 04/11/2021 0302   CL 104  04/10/2021 0318   CO2 26 04/11/2021 0302   CO2 27 04/10/2021 0318   BUN 10 04/11/2021 0302   BUN 8 04/10/2021 0318   CREATININE 0.62 04/11/2021 0302   CREATININE 0.65 04/10/2021 0318   CREATININE 0.98 04/17/2017 1049   GLUCOSE 119 (H) 04/11/2021 0302   GLUCOSE 131 (H) 04/10/2021 0318   CALCIUM 8.2 (L) 04/11/2021 0302   CALCIUM 8.1 (L) 04/10/2021 0318   AST 17 04/11/2021 0302   AST 19 04/08/2021 0432   ALT 12 04/11/2021 0302   ALT 14 04/08/2021 0432   ALKPHOS 62 04/11/2021 0302   ALKPHOS 63 04/08/2021 0432   BILITOT 0.4 04/11/2021 0302   BILITOT 0.6 04/08/2021 0432   PROT 6.1 (L) 04/11/2021 0302   PROT 5.8 (L) 04/08/2021 0432   ALBUMIN 2.4 (L) 04/11/2021 0302   ALBUMIN 2.4 (L) 04/08/2021 0432    Studies/Results: No results found.    [name] 04/12/2021   Patient ID: Jacquelyne Balint, male   DOB: 02-04-1951, 70 y.o.   MRN: [id number]

## 2021-04-13 ENCOUNTER — Inpatient Hospital Stay (HOSPITAL_COMMUNITY): Payer: Medicare Other

## 2021-04-13 LAB — GLUCOSE, CAPILLARY
Glucose-Capillary: 124 mg/dL — ABNORMAL HIGH (ref 70–99)
Glucose-Capillary: 119 mg/dL — ABNORMAL HIGH (ref 70–99)
Glucose-Capillary: 116 mg/dL — ABNORMAL HIGH (ref 70–99)
Glucose-Capillary: 108 mg/dL — ABNORMAL HIGH (ref 70–99)

## 2021-04-13 LAB — BASIC METABOLIC PANEL
Anion gap: 6 (ref 5–15)
BUN: 12 mg/dL (ref 8–23)
CO2: 26 mmol/L (ref 22–32)
Calcium: 8.3 mg/dL — ABNORMAL LOW (ref 8.9–10.3)
Chloride: 102 mmol/L (ref 98–111)
Creatinine, Ser: 0.83 mg/dL (ref 0.61–1.24)
GFR, Estimated: >60 mL/min (ref >60)
Glucose, Bld: 124 mg/dL — ABNORMAL HIGH (ref 70–99)
Potassium: 4.2 mmol/L (ref 3.5–5.1)
Sodium: 134 mmol/L — ABNORMAL LOW (ref 135–145)

## 2021-04-13 LAB — PROTIME-INR
INR: 1.1 (ref 0.8–1.2)
Prothrombin Time: 14.3 seconds (ref 11.4–15.2)

## 2021-04-13 LAB — PHOSPHORUS: Phosphorus: 3.7 mg/dL (ref 2.5–4.6)

## 2021-04-13 LAB — MAGNESIUM: Magnesium: 2.2 mg/dL (ref 1.7–2.4)

## 2021-04-13 MED ORDER — PANTOPRAZOLE SODIUM 40 MG PO TBEC
40.0000 mg | DELAYED_RELEASE_TABLET | Freq: Two times a day (BID) | ORAL | Status: DC
  Administered 2021-04-13 – 2021-04-15 (×4): 40 mg via ORAL
  Filled 2021-04-13 (×4): qty 1

## 2021-04-13 MED ORDER — FENTANYL CITRATE (PF) 100 MCG/2ML IJ SOLN
INTRAMUSCULAR | Status: AC
  Filled 2021-04-13: qty 2

## 2021-04-13 MED ORDER — ENOXAPARIN SODIUM 40 MG/0.4ML IJ SOSY: 40.0000 mg | PREFILLED_SYRINGE | INTRAMUSCULAR | Status: DC

## 2021-04-13 MED ORDER — MIDAZOLAM HCL 2 MG/2ML IJ SOLN
INTRAMUSCULAR | Status: AC
  Filled 2021-04-13: qty 4

## 2021-04-13 MED ORDER — FENTANYL CITRATE (PF) 100 MCG/2ML IJ SOLN
INTRAMUSCULAR | Status: AC | PRN
  Administered 2021-04-13 (×2): 50 ug via INTRAVENOUS

## 2021-04-13 MED ORDER — TRAVASOL 10 % IV SOLN
INTRAVENOUS | Status: AC
  Filled 2021-04-13: qty 1104

## 2021-04-13 MED ORDER — ENOXAPARIN SODIUM 40 MG/0.4ML IJ SOSY
40.0000 mg | PREFILLED_SYRINGE | INTRAMUSCULAR | Status: DC
  Administered 2021-04-14 – 2021-04-15 (×2): 40 mg via SUBCUTANEOUS
  Filled 2021-04-13 (×2): qty 0.4

## 2021-04-13 MED ORDER — LIDOCAINE HCL (PF) 1 % IJ SOLN
INTRAMUSCULAR | Status: AC | PRN
  Administered 2021-04-13: 10 mL

## 2021-04-13 NOTE — Progress Notes (Signed)
MEDICATION-RELATED CONSULT NOTE   IR Procedure Consult - Anticoagulant/Antiplatelet PTA/Inpatient Med List Review by Pharmacist    Procedure: drain placed via RIGHT transgluteal approach    Completed: 04/13/21 at 14:06  Post-Procedural bleeding risk per IR MD assessment:  standard  Antithrombotic medications on inpatient or PTA profile prior to procedure:   Lovenox 40mg  SQ q24h    Recommended restart time per IR Post-Procedure Guidelines:   Day + 1 (Next AM)      Other considerations:      Plan:    Resume Lovenox 40mg  SQ q24h tomorrow at , PharmD, BCPS Pharmacy: [phone] 04/13/2021 2:10 PM

## 2021-04-13 NOTE — Progress Notes (Signed)
PHARMACY - TOTAL PARENTERAL NUTRITION CONSULT NOTE   Indication: SBO  Patient Measurements: Height: 5' 11.5" (181.6 cm) Weight: 103 kg (227 lb 1.2 oz) IBW/kg (Calculated) : 76.45 TPN AdjBW (KG): 83.1 Body mass index is 31.23 kg/m.   Assessment: 6/27 Diag > Expl Lap with SBO, mesh repair, peritonitis. Pharmacy consulted to manage TPN.  Glucose / Insulin: No hx DM. CBG goal <180. -CBG range: 112-124; 1 units SSI used in past 24 hrs Electrolytes: All lytes WNL Goal: K >/= 4, Mag >/= 2 Renal: SCr, BUN WNL and stable Hepatic: LFTs wnl TG: 93 (7/1), 106 (7/4) Pre-Albumin: 5.6 (7/1), inc to 9.9 (7/4) Intake / Output; MIVF: I/O net -2083 mL, 1 stool documented on 7/5 MIVF: D5 1/2 NS at 10 mL/hr -UOP 3585 mL; drains 120 mL GI Imaging: -6/30 CT Abd: persistent SBO vs post-op ileus GI Surgeries / Procedures:   -6/20 repair recurrent ventral hernia w/mesh.  -6/27 Ex lap, small bowel resection w/mesh -7/6 IR consult for percutaneous drainage, advanced to soft diet once seen by IR and can figure out if he can have drain placed per notes  Central access: Double lumen PICC TPN start date: 6/30  Nutritional Goals (per RD recommendation on 6/30): kCal: 2150-2350, Protein: 100-115gm, Fluid: 2.3 L per day   Goal TPN rate is 100 mL/hr (provides 110 g of protein, 360 g dextrose, and 2194 kcals per day)  Current Nutrition: TPN 7/4 tolerated CLD and advanced to FLD today. 7/6: hopeful to advance to soft diet today per notes  Plan:  At 1800: Continue TPN at goal rate of 100 mL/hr  Electrolytes in TPN: Na 110 mEq/L K 40 mEq/L Ca 4 mEq/L Mg 29mEq/L Phos 12 mmol/L Cl:Ac 1:1 Add standard MVI and trace elements to TPN Continue ACHS SSI and CBG checks Continue MIVF at [facility] per Md Monitor TPN labs on Mon/Thurs and as needed Follow up oral intake of advanced diet.  Consider weaning TPN when tolerating > 60% of diet from PO intake.     Hessie Knows, PharmD, BCPS Secure Chat if  ?s 04/13/2021 10:12 AM

## 2021-04-13 NOTE — Procedures (Signed)
Interventional Radiology Procedure Note  Procedure: 70F drain placed via RIGHT transgluteal approach.  Evacuation of 10 mL turbid serosanguinous fluid.   Complications: None  Estimated Blood Loss: None  Recommendations: - Drain to JP - Flush Q shift   Signed,  Sterling Big, MD

## 2021-04-13 NOTE — Progress Notes (Signed)
Phlebotomy contacted IVT for DBIV.  IVT arrived at room and patient off floor for a procedure.  Support person advised will come back once RN makes IVT aware pt in room.

## 2021-04-13 NOTE — Consult Note (Signed)
Chief Complaint: Patient was seen in consultation today for CT-guided ablation/possible drainage of pelvic/rectovesical fluid collection Chief Complaint  Patient presents with   Constipation     Referring Physician(s): Gerkin,T  Supervising Physician: Malachy Moan  Patient Status: [facility] - In-pt  History of Present Illness: Brian Atkinson is a 70 y.o. male who is status post retrovascular recurrent incisional hernia repair with Vicryl mesh, reconstruction of the posterior layer and Phasix absorbable retrovascular mesh, and small bowel resection on 04/04/2021.  He now has rising WBC count ,currently 17.4, and  CT abdomen pelvis performed on 7/5 which revealed:  1. Interval development of a 5.8 x 2.8 cm rectovesical pouch abscess. 2. Status post ventral wall hernia repair with interval development of an associated 7 x 4.5 x 2 cm abdominal wall fluid and gas collection with no definite peripheral enhancement along a subcutaneus soft tissue Penrose drain. The collection is contiguous with the superficial dermal incision. There is no definite enterocutaneous fistula with the subjacent small bowel located just inferior to the peritoneum. Differential diagnosis includes seroma versus developing abscess. 3. Improving postoperative ileus  He is afebrile, currently denies headache, chest pain, dyspnea, cough, worsening abdominal pain, back pain, nausea, vomiting or bleeding.  Request now received from surgical team for image guided drainage of the rectovesical pouch fluid collection.  Past Medical History:  Diagnosis Date   Acute osteomyelitis of patella (HCC)    Complication of anesthesia    post-op memory problems no versed   History of MRSA infection    Medical history non-contributory    Patella fracture    left     Past Surgical History:  Procedure Laterality Date   BONE EXCISION Left 09/25/2017   Procedure: PARTIAL EXCISION OF LEFT PATELLA;  Surgeon: Myrene Galas, MD;  Location: [facility] OR;  Service: Orthopedics;  Laterality: Left;   CATARACT EXTRACTION W/ INTRAOCULAR LENS  IMPLANT, BILATERAL     COLONOSCOPY     DEBRIDEMENT AND CLOSURE WOUND Left 06/19/2014   Procedure: DEBRIDEMENT AND CLOSURE LEFT KNEE;  Surgeon: Kathryne Hitch, MD;  Location: [facility] ORS;  Service: Orthopedics;  Laterality: Left;   EXTERNAL FIXATION LEG Left 07/23/2020   Procedure: EXTERNAL FIXATION LEG;  Surgeon: Yolonda Kida, MD;  Location: [facility] OR;  Service: Orthopedics;  Laterality: Left;   EXTERNAL FIXATION LEG Left 07/27/2020   Procedure: Adjustment of external fixator left leg;  Surgeon: Myrene Galas, MD;  Location: [facility] OR;  Service: Orthopedics;  Laterality: Left;   EXTERNAL FIXATION REMOVAL Left 08/12/2020   Procedure: REMOVAL EXTERNAL FIXATION LEG;  Surgeon: Myrene Galas, MD;  Location: [facility] OR;  Service: Orthopedics;  Laterality: Left;   FOREIGN BODY REMOVAL N/A 03/28/2021   Procedure: OPEN EXCISION OF INTRABDOMINAL MESH;  Surgeon: Quentin Ore, MD;  Location: [facility] ORS;  Service: General;  Laterality: N/A;   IRRIGATION AND DEBRIDEMENT KNEE Left 06/17/2014   Procedure: IRRIGATION AND DEBRIDEMENT KNEE;  Surgeon: Budd Palmer, MD;  Location: [facility] OR[facility];  Service: Orthopedics;  Laterality: Left;   KNEE BURSECTOMY Left 05/27/2015   KNEE BURSECTOMY Left 05/27/2015   Procedure: KNEE BURSECTOMY, suture removal patellar tendon;  Surgeon: Myrene Galas, MD;  Location: [facility] OR;  Service: Orthopedics;  Laterality: Left;   LAPAROSCOPY N/A 04/04/2021   Procedure: Diagnostic laparotomy, exploratory laparotomy, small bowel resection with mesh;  Surgeon: Quentin Ore, MD;  Location: [facility];  Service: General;  Laterality: N/A;   ORIF PATELLA Left 06/12/2014   Procedure: OPEN REDUCTION INTERNAL (ORIF)  FIXATION PATELLA;  Surgeon: Budd Palmer, MD;  Location: [facility] OR;  Service: Orthopedics;  Laterality: Left;   ORIF TIBIA PLATEAU Left 08/12/2020   ORIF TIBIA PLATEAU Left  08/12/2020   Procedure: OPEN REDUCTION INTERNAL FIXATION (ORIF) BICONDYLAR TIBIAL PLATEAU;  Surgeon: Myrene Galas, MD;  Location: [facility] OR;  Service: Orthopedics;  Laterality: Left;   ORIF TIBIA PLATEAU Left 12/14/2020   Procedure: TIBIA REPAIR USING ILLIAC CREST BONE GRAFT VERSUS REAMED INTRAMEDULLARY ASPIRATE;  Surgeon: Myrene Galas, MD;  Location: [facility] OR;  Service: Orthopedics;  Laterality: Left;   REFRACTIVE SURGERY Bilateral    TONSILLECTOMY     VENTRAL HERNIA REPAIR N/A 03/28/2021   Procedure: REPAIR OF RECURRENT VENTRAL HERNIA WITH MESH;  Surgeon: Stechschulte, Hyman Hopes, MD;  Location: [facility] ORS;  Service: General;  Laterality: N/A;    Allergies: Versed [midazolam]  Medications: Prior to Admission medications   Medication Sig Start Date End Date Taking? Authorizing Provider  oxyCODONE-acetaminophen (PERCOCET) 5-325 MG tablet Take 1-2 tablets by mouth every 4 (four) hours as needed for severe pain. 03/29/21 03/29/22 Yes Stechschulte, Hyman Hopes, MD  acetaminophen (TYLENOL) 500 MG tablet TAKE 1 TABLET (500 MG TOTAL) BY MOUTH EVERY TWELVE HOURS. 12/17/20 12/17/21  Montez Morita, PA-C  cetirizine (ZYRTEC) 10 MG tablet Take 10 mg by mouth daily as needed for allergies.    [provider]  Diclofenac Sodium CR 100 MG 24 hr tablet Take 1 tablet (100 mg total) by mouth daily. 02/27/21   Palumbo, April, MD  docusate sodium (COLACE) 100 MG capsule TAKE 1 CAPSULE (100 MG TOTAL) BY MOUTH TWO TIMES DAILY. Patient not taking: No sig reported 12/17/20 12/17/21  Montez Morita, PA-C  methocarbamol (ROBAXIN) 500 MG tablet TAKE 1-2 TABLETS (500-1,000 MG TOTAL) BY MOUTH EVERY SIX HOURS AS NEEDED FOR MUSCLE SPASMS. Patient not taking: No sig reported 12/17/20 12/17/21  Montez Morita, PA-C  Multiple Vitamins-Minerals (ONE-A-DAY MENS 50+) TABS Take 1 tablet by mouth daily.    [provider]  naloxone (NARCAN) nasal spray 4 mg/0.1 mL USE AS DIRECTED IN EITHER NOSTRIL AT SIGNS OF OPIOD OVERDOSE. MAY REPEAT IN OPPOSITE  NOSTRIL WITH NEW SPRAY IN 2-3 MINUTES IF MINIMAL RESPONSE Patient not taking: No sig reported 12/17/20 12/17/21  Montez Morita, PA-C  oxyCODONE-acetaminophen (PERCOCET) 10-325 MG tablet TAKE 1-2 TABLETS BY MOUTH EVERY SIX HOURS AS NEEDED FOR PAIN. Patient not taking: No sig reported 12/17/20 06/15/21  Montez Morita, PA-C  rivaroxaban (XARELTO) 10 MG TABS tablet TAKE 1 TABLET (10 MG TOTAL) BY MOUTH DAILY. Patient not taking: No sig reported 12/17/20 12/17/21  Montez Morita, PA-C  vitamin C (ASCORBIC ACID) 500 MG tablet TAKE 1 TABLET (1,000 MG TOTAL) BY MOUTH DAILY. Patient not taking: No sig reported 07/28/20 07/28/21  Montez Morita, PA-C  Vitamin D3 (VITAMIN D) 25 MCG tablet TAKE 2 TABLETS (2,000 UNITS TOTAL) BY MOUTH DAILY. Patient not taking: No sig reported 08/16/20 08/16/21  Montez Morita, PA-C  enoxaparin (LOVENOX) 40 MG/0.4ML injection Inject 0.4 mLs (40 mg total) into the skin daily. 07/28/20 08/16/20  Montez Morita, PA-C  ferrous sulfate 325 (65 FE) MG tablet Take 1 tablet (325 mg total) by mouth 3 (three) times daily with meals. Patient not taking: Reported on 12/09/2020 07/28/20 12/17/20  Montez Morita, PA-C     History reviewed. No pertinent family history.  Social History   Socioeconomic History   Marital status: Married    Spouse name: Not on file   Number of children: Not on file   Years  of education: Not on file   Highest education level: Not on file  Occupational History   Not on file  Tobacco Use   Smoking status: Never   Smokeless tobacco: Never  Vaping Use   Vaping Use: Never used  Substance and Sexual Activity   Alcohol use: Not Currently   Drug use: No   Sexual activity: Not Currently  Other Topics Concern   Not on file  Social History Narrative   Not on file   Social Determinants of Health   Financial Resource Strain: Not on file  Food Insecurity: Not on file  Transportation Needs: Not on file  Physical Activity: Not on file  Stress: Not on file  Social Connections: Not on  file      Review of Systems see above  Vital Signs: BP 126/76 (BP Location: Left Arm)   Pulse 79   Temp 97.7 F (36.5 C) (Oral)   Resp 15   Ht 5' 11.5" (1.816 m)   Wt 227 lb 1.2 oz (103 kg)   SpO2 97%   BMI 31.23 kg/m   Physical Exam awake, alert.  Chest clear to auscultation bilaterally.  Heart with regular rate and rhythm.  Abdomen soft, some mild distention, currently nontender.  Left surgical drain intact, midline incision with staples/overlying gauze; no lower extremity edema  Imaging: CT ABDOMEN PELVIS WO CONTRAST  Result Date: 04/07/2021 CLINICAL DATA:  Progressive abdominal pain following umbilical hernia repair EXAM: CT ABDOMEN AND PELVIS WITHOUT CONTRAST TECHNIQUE: Multidetector CT imaging of the abdomen and pelvis was performed following the standard protocol without IV contrast. COMPARISON:  04/04/2021 FINDINGS: Lower chest: Mild bibasilar atelectasis. The visualized heart and pericardium are unremarkable. Hepatobiliary: Vicarious excretion of contrast within the gallbladder lumen. No pericholecystic inflammatory change. Multiple hepatic cysts are again identified. The liver is otherwise unremarkable. No intra or extrahepatic biliary ductal dilation. Pancreas: Unremarkable Spleen: Unremarkable Adrenals/Urinary Tract: Adrenal glands are unremarkable. Kidneys are normal, without renal calculi, focal lesion, or hydronephrosis. Bladder is unremarkable. Stomach/Bowel: Surgical changes of ventral hernia repair are identified with a Penrose drain within the superficial subcutaneous fat of the laparotomy incision as well as a surgical drainage catheter subjacent to the rectus abdominus musculature within the potential space between the investing fascia and the transversus abdominus. Surgical changes of partial small bowel resection are identified. Nasogastric tube tip is seen within the proximal body of the stomach. There has been interval decrease in size of the caliber of dilated small  bowel when compared to prior examination, though multiple loops of gas and fluid-filled small bowel are identified extending to the point of a the anastomotic staple line within the right lower quadrant of the abdomen. The small bowel distal to this point is decompressed. Together, the findings are in keeping with a persistent partial small bowel obstruction at this level or postoperative ileus. There is mesenteric edema involving the small bowel mesentery, most severe in the region of the small bowel anastomosis, possibly postsurgical in nature. Trace ascites. No free intraperitoneal gas. The appendix and large bowel are unremarkable. Vascular/Lymphatic: Mild aortoiliac atherosclerotic calcification. No aortic aneurysm. No pathologic adenopathy within the abdomen and pelvis. Reproductive: Prostate is unremarkable. Other: Small bilateral fat containing inguinal hernias are present. Musculoskeletal: Degenerative changes are seen within the lumbar spine. No acute bone abnormality. No lytic or blastic bone lesion. IMPRESSION: Interval ventral hernia repair and partial small bowel resection. Extensive mesenteric edema within the small bowel mesentery, particularly in the region of the surgical anastomosis possibly  postsurgical in nature. Persistent partial small bowel obstruction at the level of the anastomotic staple line versus postoperative ileus. Aortic Atherosclerosis (ICD10-I70.0). Electronically Signed   By: Helyn Numbers MD   On: 04/07/2021 07:13   CT ABDOMEN PELVIS W CONTRAST  Result Date: 04/12/2021 CLINICAL DATA:  Status post ventral hernia repair. Still having abdominal pain. EXAM: CT ABDOMEN AND PELVIS WITH CONTRAST TECHNIQUE: Multidetector CT imaging of the abdomen and pelvis was performed using the standard protocol following bolus administration of intravenous contrast. CONTRAST:  OMNIPAQUE IOHEXOL 300 MG/ML  SOLN COMPARISON:  CT abdomen pelvis 04/07/2021 FINDINGS: Lower chest: Bibasilar linear  atelectasis. Hepatobiliary: Several fluid density lesions within the liver appears similar to prior and likely/statistically represents simple hepatic cyst. Subcentimeter hypodensities are too small to characterize. No focal liver abnormality. No gallstones, gallbladder wall thickening, or pericholecystic fluid. No biliary dilatation. Pancreas: No focal lesion. Normal pancreatic contour. No surrounding inflammatory changes. No main pancreatic ductal dilatation. Spleen: Normal in size without focal abnormality. Adrenals/Urinary Tract: No adrenal nodule bilaterally. Bilateral kidneys enhance symmetrically. No hydronephrosis. No hydroureter. The urinary bladder is unremarkable. Stomach/Bowel: PO contrast reaches the colon. No extravasation of PO contrast. Right lower quadrant small bowel resection surgical changes again noted. Stomach is within normal limits. Slightly distended couple loops of small bowel with associated surrounding fat stranding. No pneumatosis. No evidence of bowel wall thickening or dilatation. Appendix appears normal. Vascular/Lymphatic: No abdominal aorta or iliac aneurysm. Mild atherosclerotic plaque of the aorta and its branches. No abdominal, pelvic, or inguinal lymphadenopathy. Reproductive: Prostate is unremarkable. Other: No intraperitoneal free gas. Trace simple free pelvic fluid. Interval development of a lobulated peripherally enhancing fluid collection measuring up to 5.8 x 2.8 cm (3:82) within the pelvis centered in the rectovesical pouch. Musculoskeletal: Surgical changes related to an anterior abdominal wall ventral hernia repair. Penrose drain again noted within the deep subcutaneus soft tissues just anterior to the peritoneum. Interval development of an associated anterior abdominal wall fluid and gas collection along the Penrose drain. The collection measures approximately 7 x 4.5 x 2 cm with no definite enterocutaneous fistula with the adjacent partially PO contrast filled small  bowel noted just inferior to the peritoneum. This subcutaneus soft tissue fluid collection is noted to extend to the nervous along the skin staples (3:59). The fluid collection is also noted to extends slightly superiorly with a very thin fluid collection with limited evaluation due to motion artifact (7:73). Slight surrounding subcutaneus soft tissue edema/fat stranding but no definite peripheral enhancement. No suspicious lytic or blastic osseous lesions. No acute displaced fracture. Multilevel degenerative changes of the spine. IMPRESSION: 1. Interval development of a 5.8 x 2.8 cm rectovesical pouch abscess. 2. Status post ventral wall hernia repair with interval development of an associated 7 x 4.5 x 2 cm abdominal wall fluid and gas collection with no definite peripheral enhancement along a subcutaneus soft tissue Penrose drain. The collection is contiguous with the superficial dermal incision. There is no definite enterocutaneous fistula with the subjacent small bowel located just inferior to the peritoneum. Differential diagnosis includes seroma versus developing abscess. 3. Improving postoperative ileus. Electronically Signed   By: Tish Frederickson M.D.   On: 04/12/2021 20:25   CT ABDOMEN PELVIS W CONTRAST  Result Date: 04/04/2021 CLINICAL DATA:  Recent ventral hernia repair.  Abdominal pain EXAM: CT ABDOMEN AND PELVIS WITH CONTRAST TECHNIQUE: Multidetector CT imaging of the abdomen and pelvis was performed using the standard protocol following bolus administration of intravenous contrast. CONTRAST:  OMNIPAQUE IOHEXOL 300 MG/ML  SOLN COMPARISON:  Feb 26, 2021 FINDINGS: Lower chest: There is patchy airspace opacity in the inferior lingula. Areas of atelectatic change in each lower lobe noted. There is a small hiatal hernia. Hepatobiliary: There are multiple liver cysts with the largest cyst in the anterior segment right lobe of the liver measuring 2.9 x 2.2 cm, stable. No new liver lesions are  evident. Gallbladder wall is not appreciably thickened. There is no biliary duct dilatation. Pancreas: No pancreatic mass or inflammatory focus. Spleen: No splenic lesions are evident. Adrenals/Urinary Tract: Adrenals bilaterally appear normal. There is a 7 mm cyst in the anterior mid left kidney. There is an apparent parapelvic cyst on the left measuring 1 x 1 cm. There is no appreciable hydronephrosis on either side. There is no renal or ureteral calculus on either side. Urinary bladder is midline with wall thickness within normal limits. Stomach/Bowel: There is dilated fluid-filled proximal and mid small bowel. There is a transition zone in the region of the distal jejunum consistent with a degree of small-bowel obstruction. No appreciable pneumoperitoneum or portal venous air evident. Note that the terminal ileal region appears unremarkable. The appendix is not appreciable on this examination. There is no periappendiceal region inflammation evident. Vascular/Lymphatic: No abdominal aortic aneurysm. There are scattered foci of aortic and iliac artery atherosclerotic arterial vascular calcification. Major venous structures appear patent. No evident adenopathy in the abdomen or pelvis. Reproductive: Prostate and seminal vesicles are normal in size and contour. Other: Postoperative changes noted in the midline upper abdomen slightly superior to the umbilicus consistent with recent ventral hernia repair. Postoperative air and fluid is noted in this area. Mild fluid noted in this area consistent with recent surgery. It should be noted that early abscess in this area cannot be entirely excluded by CT. Mild air in the abdominal wall slightly to the right of midline in this area is felt to be of postoperative etiology. There is fat in each inguinal ring. There is no abscess in the peritoneum or retroperitoneum of the abdomen pelvis. No appreciable ascites in the abdomen or pelvis. Musculoskeletal: No blastic or lytic  bone lesions are appreciable. There is degenerative change in lumbar spine. There is spinal stenosis at L4-5 due to bony hypertrophy and disc protrusion. No intramuscular lesions are evident. IMPRESSION: 1. Evidence of small bowel obstruction with apparent transition zone in the distal jejunal region. No free air evident. No abscess in the peritoneum or retroperitoneum of the abdomen and pelvis. 2. Postoperative change in the anterior abdominal wall region consistent with recent ventral hernia repair. Small amount of air in fluid at the site of hernia repair. It is possible that there is early developing abscess in this area with fluid and air collection measuring 2.5 x 1.5 cm. Close clinical surveillance of this area is felt to be warranted. 3. Apparent pneumonia inferior lingula. Areas of atelectatic change in each lower lobe noted as well. 4. 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 5.  Aortic Atherosclerosis (ICD10-I70.0). 6.  Small hiatal hernia.  Fat noted in each inguinal ring. Electronically Signed   By: Bretta Bang III M.D.   On: 04/04/2021 15:08   DG Chest Port 1 View  Result Date: 04/06/2021 CLINICAL DATA:  70 year old male with nausea EXAM: PORTABLE CHEST - 1 VIEW COMPARISON:  12/14/2020 FINDINGS: The mediastinal contours are within normal limits. No cardiomegaly. Gastric decompression tube place tip in the gastric fundus, proximal side hole distal  esophagus. Low lung volumes. The lungs are clear bilaterally without evidence of focal consolidation, pleural effusion, or pneumothorax. No acute osseous abnormality. IMPRESSION: 1. Gastric decompression tube in place with the proximal side hole in distal esophagus. Recommend advancement by approximately 5-10 cm. 2. Low lung volumes.  No acute cardiopulmonary process. Electronically Signed   By: Marliss Cootsylan  Suttle MD   On: 04/06/2021 08:53   DG Abd 2 Views  Result Date: 04/08/2021 CLINICAL DATA:  Follow up small bowel  obstruction EXAM: ABDOMEN - 2 VIEW COMPARISON:  04/06/2021 FINDINGS: Scattered large and small bowel gas is noted. Mild persistent small bowel dilatation is noted similar to that seen on the prior exam. Surgical drain is again noted in place. Gastric catheter is no longer identified. IMPRESSION: Overall stable appearance of the abdomen with small bowel dilatation. Electronically Signed   By: Alcide CleverMark  Lukens M.D.   On: 04/08/2021 11:19   DG Abd Portable 1V  Result Date: 04/06/2021 CLINICAL DATA:  Hernia repair.  Nausea vomiting. EXAM: PORTABLE ABDOMEN - 1 VIEW COMPARISON:  Abdomen 04/05/2021.  CT 04/04/2021. FINDINGS: Surgical staples noted over the abdomen. NG tube noted with tip over the upper portion stomach. NG tube side hole at the gastroesophageal junction. Advancement of approximately 10 cm suggested. Drainage catheter noted over the abdomen. Persistent dilated loops of small bowel with a paucity of intra colonic air. Findings suggest small-bowel obstruction. Similar findings noted on prior exam. No free air identified. Bibasilar atelectasis. Degenerative change lumbar spine and both hips. IMPRESSION: 1. NG tube noted with tip over the upper portion of the stomach and side hole at the gastroesophageal junction. NG tube advancement of approximately 10 cm suggested. 2. Surgical staples noted over the abdomen. Drainage catheter noted over the abdomen. 3. Persistent dilated loops of small bowel with a paucity of intra colonic air. Findings suggest small bowel obstruction. Similar findings noted on prior exam. 4.  Bibasilar atelectasis. Electronically Signed   By: Maisie Fushomas  Register   On: 04/06/2021 08:50   DG Abd Portable 1V  Result Date: 04/05/2021 CLINICAL DATA:  NG tube placement. EXAM: PORTABLE ABDOMEN - 1 VIEW COMPARISON:  CT 04/04/2021. FINDINGS: NG tube noted with tip over the stomach. A catheter is noted over the lower right abdomen. Multiple dilated loops of small bowel are again noted. Stool noted  throughout the colon. No free air. Mild bibasilar atelectasis/infiltrates. IMPRESSION: 1.  NG tube noted with tip over the stomach. 2. Multiple dilated loops of small bowel again noted. Findings consistent small bowel obstruction. 3. Bibasilar atelectasis. Bibasilar infiltrates cannot be excluded. Electronically Signed   By: Maisie Fushomas  Register   On: 04/05/2021 11:20   US EKG SITE RITE  Result Date: 04/07/2021 If Site Rite image not attached, placement could not be confirmed due to current cardiac rhythm.   Labs:  CBC: Recent Labs    04/09/21 0307 04/10/21 0318 04/11/21 0302 04/12/21 1128  WBC 14.4* 13.5* 13.7* 17.4*  HGB 11.6* 11.4* 11.1* 12.1*  HCT 36.3* 36.4* 36.6* 39.2  PLT 336 355 394 457*    COAGS: Recent Labs    07/23/20 1454 07/27/20 0325 08/12/20 0709 12/14/20 0903  INR 1.0 1.1 1.1 1.1  APTT  --   --  30 30    BMP: Recent Labs    04/09/21 0307 04/10/21 0318 04/11/21 0302 04/13/21 0324  NA 132* 137 137 134*  K 3.9 4.2 4.4 4.2  CL 99 104 105 102  CO2 26 27 26 26   GLUCOSE 122* 131*  119* 124*  BUN CALCIUM 7.6* 8.1* 8.2* 8.3*  CREATININE 0.74 0.65 0.62 0.83  GFRNONAA >60 >60 >60 >60    LIVER FUNCTION TESTS: Recent Labs    02/26/21 1945 04/04/21 1240 04/08/21 0432 04/11/21 0302  BILITOT 0.6 0.9 0.6 0.4  AST 18 44* 19 17  ALT 14 49* 14 12  ALKPHOS 122 76 63 62  PROT 7.6 7.7 5.8* 6.1*  ALBUMIN 4.0 3.7 2.4* 2.4*    TUMOR MARKERS: No results for input(s): AFPTM, CEA, CA199, CHROMGRNA in the last 8760 hours.  Assessment and Plan: 70 y.o. male who is status post retrovascular recurrent incisional hernia repair with Vicryl mesh, reconstruction of the posterior layer and Phasix absorbable retrovascular mesh, and small bowel resection on 04/04/2021.  He now has rising WBC count ,currently 17.4, and  CT abdomen pelvis performed on 7/5 which revealed:  1. Interval development of a 5.8 x 2.8 cm rectovesical pouch abscess. 2. Status post ventral  wall hernia repair with interval development of an associated 7 x 4.5 x 2 cm abdominal wall fluid and gas collection with no definite peripheral enhancement along a subcutaneus soft tissue Penrose drain. The collection is contiguous with the superficial dermal incision. There is no definite enterocutaneous fistula with the subjacent small bowel located just inferior to the peritoneum. Differential diagnosis includes seroma versus developing abscess. 3. Improving postoperative ileus  He is afebrile, currently denies headache, chest pain, dyspnea, cough, worsening abdominal pain, back pain, nausea, vomiting or bleeding.  Request now received from surgical team for image guided aspiration/possible drainage of the rectovesical pouch fluid collection.  Imaging studies have been reviewed by Dr. Archer Asa.Risks and benefits discussed with the patient /spouse including bleeding, infection, damage to adjacent structures, bowel perforation/fistula connection, and sepsis.  All of the patient's questions were answered, patient is agreeable to proceed. Consent signed and in chart.  Procedure scheduled for this afternoon  Thank you for this interesting consult.  I greatly enjoyed meeting Anthone Prieur and look forward to participating in their care.  A copy of this report was sent to the requesting provider on this date.  Electronically Signed: D. Jeananne Rama, PA-C 04/13/2021, 11:41 AM   I spent a total of 25 minutes    in face to face in clinical consultation, greater than 50% of which was counseling/coordinating care for image guided aspiration/possible drainage of pelvic/rectovesical pouch fluid collection

## 2021-04-13 NOTE — Progress Notes (Signed)
Progress Note  9 Days Post-Op  Subjective: Patient reports he had a BM yesterday and is feeling a little better. He denies nausea this AM. He had a CT yesterday and we reviewed results of this. He and his wife would like a copy of CT report.   Objective: Vital signs in last 24 hours: Temp:  [97.7 F (36.5 C)-98.6 F (37 C)] 97.7 F (36.5 C) (07/06 0533) Pulse Rate:  [79-88] 79 (07/06 0533) Resp:  [15-18] 15 (07/06 0533) BP: (118-139)/(75-81) 126/76 (07/06 0533) SpO2:  [97 %] 97 % (07/06 0533) Last BM Date: 03/27/21  Intake/Output from previous day: 07/05 0701 - 07/06 0700 In: 1546.5 [P.O.:580; I.V.:816.5; IV Piggyback:150] Out: 8003 [Urine:3575; Drains:10] Intake/Output this shift: No intake/output data recorded.  PE: General: pleasant, WD, overweight malewho is laying in bed in NAD Heart: regular, rate, and rhythm.   Lungs: CTAB, no wheezes, rhonchi, or rales noted.  Respiratory effort nonlabored Abd: soft, NT, moderately distended, +BS, drain with SS fluid, incision with staples present and penrose inferiorly with some purulent appearing drainage, no cellulitis around incision    Lab Results:  Recent Labs    04/11/21 0302 04/12/21 1128  WBC 13.7* 17.4*  HGB 11.1* 12.1*  HCT 36.6* 39.2  PLT 394 457*   BMET Recent Labs    04/11/21 0302 04/13/21 0324  NA 137 134*  K 4.4 4.2  CL 105 102  CO2 26 26  GLUCOSE 119* 124*  BUN 10 12  CREATININE 0.62 0.83  CALCIUM 8.2* 8.3*   PT/INR No results for input(s): LABPROT, INR in the last 72 hours. CMP     Component Value Date/Time   NA 134 (L) 04/13/2021 0324   K 4.2 04/13/2021 0324   CL 102 04/13/2021 0324   CO2 26 04/13/2021 0324   GLUCOSE 124 (H) 04/13/2021 0324   BUN 12 04/13/2021 0324   CREATININE 0.83 04/13/2021 0324   CREATININE 0.98 04/17/2017 1049   CALCIUM 8.3 (L) 04/13/2021 0324   PROT 6.1 (L) 04/11/2021 0302   ALBUMIN 2.4 (L) 04/11/2021 0302   AST 17 04/11/2021 0302   ALT 12 04/11/2021 0302    ALKPHOS 62 04/11/2021 0302   BILITOT 0.4 04/11/2021 0302   GFRNONAA >60 04/13/2021 0324   GFRAA >60 09/25/2017 0648   Lipase     Component Value Date/Time   LIPASE 26 04/04/2021 1240       Studies/Results: CT ABDOMEN PELVIS W CONTRAST  Result Date: 04/12/2021 CLINICAL DATA:  Status post ventral hernia repair. Still having abdominal pain. EXAM: CT ABDOMEN AND PELVIS WITH CONTRAST TECHNIQUE: Multidetector CT imaging of the abdomen and pelvis was performed using the standard protocol following bolus administration of intravenous contrast. CONTRAST:  OMNIPAQUE IOHEXOL 300 MG/ML  SOLN COMPARISON:  CT abdomen pelvis 04/07/2021 FINDINGS: Lower chest: Bibasilar linear atelectasis. Hepatobiliary: Several fluid density lesions within the liver appears similar to prior and likely/statistically represents simple hepatic cyst. Subcentimeter hypodensities are too small to characterize. No focal liver abnormality. No gallstones, gallbladder wall thickening, or pericholecystic fluid. No biliary dilatation. Pancreas: No focal lesion. Normal pancreatic contour. No surrounding inflammatory changes. No main pancreatic ductal dilatation. Spleen: Normal in size without focal abnormality. Adrenals/Urinary Tract: No adrenal nodule bilaterally. Bilateral kidneys enhance symmetrically. No hydronephrosis. No hydroureter. The urinary bladder is unremarkable. Stomach/Bowel: PO contrast reaches the colon. No extravasation of PO contrast. Right lower quadrant small bowel resection surgical changes again noted. Stomach is within normal limits. Slightly distended couple loops of small  bowel with associated surrounding fat stranding. No pneumatosis. No evidence of bowel wall thickening or dilatation. Appendix appears normal. Vascular/Lymphatic: No abdominal aorta or iliac aneurysm. Mild atherosclerotic plaque of the aorta and its branches. No abdominal, pelvic, or inguinal lymphadenopathy. Reproductive: Prostate is  unremarkable. Other: No intraperitoneal free gas. Trace simple free pelvic fluid. Interval development of a lobulated peripherally enhancing fluid collection measuring up to 5.8 x 2.8 cm (3:82) within the pelvis centered in the rectovesical pouch. Musculoskeletal: Surgical changes related to an anterior abdominal wall ventral hernia repair. Penrose drain again noted within the deep subcutaneus soft tissues just anterior to the peritoneum. Interval development of an associated anterior abdominal wall fluid and gas collection along the Penrose drain. The collection measures approximately 7 x 4.5 x 2 cm with no definite enterocutaneous fistula with the adjacent partially PO contrast filled small bowel noted just inferior to the peritoneum. This subcutaneus soft tissue fluid collection is noted to extend to the nervous along the skin staples (3:59). The fluid collection is also noted to extends slightly superiorly with a very thin fluid collection with limited evaluation due to motion artifact (7:73). Slight surrounding subcutaneus soft tissue edema/fat stranding but no definite peripheral enhancement. No suspicious lytic or blastic osseous lesions. No acute displaced fracture. Multilevel degenerative changes of the spine. IMPRESSION: 1. Interval development of a 5.8 x 2.8 cm rectovesical pouch abscess. 2. Status post ventral wall hernia repair with interval development of an associated 7 x 4.5 x 2 cm abdominal wall fluid and gas collection with no definite peripheral enhancement along a subcutaneus soft tissue Penrose drain. The collection is contiguous with the superficial dermal incision. There is no definite enterocutaneous fistula with the subjacent small bowel located just inferior to the peritoneum. Differential diagnosis includes seroma versus developing abscess. 3. Improving postoperative ileus. Electronically Signed   By: Tish Frederickson M.D.   On: 04/12/2021 20:25    Anti-infectives: Anti-infectives (From  admission, onward)    Start     Dose/Rate Route Frequency Ordered Stop   04/07/21 1400  piperacillin-tazobactam (ZOSYN) IVPB 3.375 g        3.375 g 12.5 mL/hr over 240 Minutes Intravenous Every 8 hours 04/07/21 0938     04/05/21 0200  ceFAZolin (ANCEF) IVPB 1 g/50 mL premix  Status:  Discontinued        1 g 100 mL/hr over 30 Minutes Intravenous Every 8 hours 04/04/21 1948 04/07/21 0938   04/04/21 2200  metroNIDAZOLE (FLAGYL) IVPB 500 mg  Status:  Discontinued        500 mg 100 mL/hr over 60 Minutes Intravenous Every 8 hours 04/04/21 1948 04/07/21 0938   04/04/21 1745  ceFAZolin (ANCEF) IVPB 2g/100 mL premix        2 g 200 mL/hr over 30 Minutes Intravenous  Once 04/04/21 1730 04/04/21 1815   04/04/21 1715  ceFAZolin (ANCEF) 2-4 GM/100ML-% IVPB       Note to Pharmacy: Sharyn Creamer   : cabinet override      04/04/21 1715 04/04/21 1818        Assessment/Plan POD#16/9 status post retromuscular recurrent incisional hernia repair with vicryl mesh, reconstruction of the posterior layer and Phasix absorbable retromuscular mesh, small bowel resection - Dr. Dossie Der 04-04-21 - WBC 17 yesterday, CT yesterday shows rectovesicular abscess - IR consult for percutaneous drainage, continue Zosyn - incision with penrose inferiorly and draining some purulent material - surgical drain with SS fluid - ileus resolving - would like to advance to soft  diet once seen by IR and we figure out if he can have drain placed - continue to mobilize  FEN: NPO, TPN VTE: LMWH ID: Zosyn 6/30>>  LOS: 9 days    Juliet Rude, [facility] 04/13/2021, 8:20 AM Please see Amion for pager number during day hours 7:00am-4:30pm

## 2021-04-14 LAB — COMPREHENSIVE METABOLIC PANEL
ALT: 21 U/L (ref 0–44)
AST: 22 U/L (ref 15–41)
Albumin: 2.6 g/dL — ABNORMAL LOW (ref 3.5–5.0)
Alkaline Phosphatase: 74 U/L (ref 38–126)
Anion gap: 6 (ref 5–15)
BUN: 16 mg/dL (ref 8–23)
CO2: 26 mmol/L (ref 22–32)
Calcium: 8.5 mg/dL — ABNORMAL LOW (ref 8.9–10.3)
Chloride: 105 mmol/L (ref 98–111)
Creatinine, Ser: 0.82 mg/dL (ref 0.61–1.24)
GFR, Estimated: >60 mL/min (ref >60)
Glucose, Bld: 112 mg/dL — ABNORMAL HIGH (ref 70–99)
Potassium: 4.2 mmol/L (ref 3.5–5.1)
Sodium: 137 mmol/L (ref 135–145)
Total Bilirubin: 0.4 mg/dL (ref 0.3–1.2)
Total Protein: 6.7 g/dL (ref 6.5–8.1)

## 2021-04-14 LAB — CBC
HCT: 35.4 % — ABNORMAL LOW (ref 39.0–52.0)
Hemoglobin: 10.9 g/dL — ABNORMAL LOW (ref 13.0–17.0)
MCH: 26.7 pg (ref 26.0–34.0)
MCHC: 30.8 g/dL (ref 30.0–36.0)
MCV: 86.8 fL (ref 80.0–100.0)
Platelets: 422 10*3/uL — ABNORMAL HIGH (ref 150–400)
RBC: 4.08 MIL/uL — ABNORMAL LOW (ref 4.22–5.81)
RDW: 15.4 % (ref 11.5–15.5)
WBC: 12 10*3/uL — ABNORMAL HIGH (ref 4.0–10.5)
nRBC: 0 % (ref 0.0–0.2)

## 2021-04-14 LAB — PHOSPHORUS: Phosphorus: 3.7 mg/dL (ref 2.5–4.6)

## 2021-04-14 LAB — GLUCOSE, CAPILLARY
Glucose-Capillary: 114 mg/dL — ABNORMAL HIGH (ref 70–99)
Glucose-Capillary: 107 mg/dL — ABNORMAL HIGH (ref 70–99)
Glucose-Capillary: 121 mg/dL — ABNORMAL HIGH (ref 70–99)
Glucose-Capillary: 108 mg/dL — ABNORMAL HIGH (ref 70–99)

## 2021-04-14 LAB — CREATININE, FLUID (PLEURAL, PERITONEAL, JP DRAINAGE): Creat, Fluid: 0.7 mg/dL

## 2021-04-14 LAB — MAGNESIUM: Magnesium: 2.2 mg/dL (ref 1.7–2.4)

## 2021-04-14 MED ORDER — POLYETHYLENE GLYCOL 3350 17 G PO PACK
17.0000 g | PACK | Freq: Every day | ORAL | Status: DC
  Administered 2021-04-14 – 2021-04-15 (×2): 17 g via ORAL
  Filled 2021-04-14 (×2): qty 1

## 2021-04-14 MED ORDER — SODIUM CHLORIDE 0.9% FLUSH
5.0000 mL | Freq: Three times a day (TID) | INTRAVENOUS | Status: DC
  Administered 2021-04-14 – 2021-04-15 (×3): 5 mL

## 2021-04-14 MED ORDER — AMOXICILLIN-POT CLAVULANATE 875-125 MG PO TABS
1.0000 | ORAL_TABLET | Freq: Two times a day (BID) | ORAL | Status: DC
  Administered 2021-04-14 – 2021-04-15 (×3): 1 via ORAL
  Filled 2021-04-14 (×3): qty 1

## 2021-04-14 MED ORDER — ENSURE ENLIVE PO LIQD
237.0000 mL | Freq: Two times a day (BID) | ORAL | Status: DC
  Administered 2021-04-14 – 2021-04-15 (×2): 237 mL via ORAL

## 2021-04-14 MED ORDER — DOCUSATE SODIUM 100 MG PO CAPS
100.0000 mg | ORAL_CAPSULE | Freq: Two times a day (BID) | ORAL | Status: DC
  Administered 2021-04-14 – 2021-04-15 (×3): 100 mg via ORAL
  Filled 2021-04-14 (×3): qty 1

## 2021-04-14 NOTE — Progress Notes (Signed)
RA PICC removed per protocol per MD order. Manual pressure applied for 5 mins. No bleeding or swelling noted. Instructed patient to remain in bed for thirty mins. Educated patient about S/S of infection and when to call MD; no heavy lifting or pressure on right side for 24 hours; keep dressing dry and intact for 24 hours. Pt verbalized comprehension. 

## 2021-04-14 NOTE — Progress Notes (Signed)
Referring Physician(s): Nat Christen  Supervising Physician: Simonne Come  Patient Status:  [facility] - In-pt  Chief Complaint:  Pelvic fluid collection/pain  Subjective: Patient states he feels better since transgluteal drain placed yesterday; does have some expected transgluteal site soreness; denies fever, chills, nausea, vomiting   Allergies: Versed [midazolam]  Medications: Prior to Admission medications   Medication Sig Start Date End Date Taking? Authorizing Provider  oxyCODONE-acetaminophen (PERCOCET) 5-325 MG tablet Take 1-2 tablets by mouth every 4 (four) hours as needed for severe pain. 03/29/21 03/29/22 Yes Stechschulte, Hyman Hopes, MD  acetaminophen (TYLENOL) 500 MG tablet TAKE 1 TABLET (500 MG TOTAL) BY MOUTH EVERY TWELVE HOURS. 12/17/20 12/17/21  Montez Morita, PA-C  cetirizine (ZYRTEC) 10 MG tablet Take 10 mg by mouth daily as needed for allergies.    [provider]  Diclofenac Sodium CR 100 MG 24 hr tablet Take 1 tablet (100 mg total) by mouth daily. 02/27/21   Palumbo, April, MD  docusate sodium (COLACE) 100 MG capsule TAKE 1 CAPSULE (100 MG TOTAL) BY MOUTH TWO TIMES DAILY. Patient not taking: No sig reported 12/17/20 12/17/21  Montez Morita, PA-C  methocarbamol (ROBAXIN) 500 MG tablet TAKE 1-2 TABLETS (500-1,000 MG TOTAL) BY MOUTH EVERY SIX HOURS AS NEEDED FOR MUSCLE SPASMS. Patient not taking: No sig reported 12/17/20 12/17/21  Montez Morita, PA-C  Multiple Vitamins-Minerals (ONE-A-DAY MENS 50+) TABS Take 1 tablet by mouth daily.    [provider]  naloxone (NARCAN) nasal spray 4 mg/0.1 mL USE AS DIRECTED IN EITHER NOSTRIL AT SIGNS OF OPIOD OVERDOSE. MAY REPEAT IN OPPOSITE NOSTRIL WITH NEW SPRAY IN 2-3 MINUTES IF MINIMAL RESPONSE Patient not taking: No sig reported 12/17/20 12/17/21  Montez Morita, PA-C  oxyCODONE-acetaminophen (PERCOCET) 10-325 MG tablet TAKE 1-2 TABLETS BY MOUTH EVERY SIX HOURS AS NEEDED FOR PAIN. Patient not taking: No sig reported 12/17/20 06/15/21   Montez Morita, PA-C  rivaroxaban (XARELTO) 10 MG TABS tablet TAKE 1 TABLET (10 MG TOTAL) BY MOUTH DAILY. Patient not taking: No sig reported 12/17/20 12/17/21  Montez Morita, PA-C  vitamin C (ASCORBIC ACID) 500 MG tablet TAKE 1 TABLET (1,000 MG TOTAL) BY MOUTH DAILY. Patient not taking: No sig reported 07/28/20 07/28/21  Montez Morita, PA-C  Vitamin D3 (VITAMIN D) 25 MCG tablet TAKE 2 TABLETS (2,000 UNITS TOTAL) BY MOUTH DAILY. Patient not taking: No sig reported 08/16/20 08/16/21  Montez Morita, PA-C  enoxaparin (LOVENOX) 40 MG/0.4ML injection Inject 0.4 mLs (40 mg total) into the skin daily. 07/28/20 08/16/20  Montez Morita, PA-C  ferrous sulfate 325 (65 FE) MG tablet Take 1 tablet (325 mg total) by mouth 3 (three) times daily with meals. Patient not taking: Reported on 12/09/2020 07/28/20 12/17/20  Montez Morita, PA-C     Vital Signs: BP 127/74 (BP Location: Right Arm)   Pulse 74   Temp 98.6 F (37 C) (Oral)   Resp 17   Ht 5' 11.5" (1.816 m)   Wt 227 lb 1.2 oz (103 kg)   SpO2 98%   BMI 31.23 kg/m   Physical Exam awake, alert.  Right transgluteal drain intact, insertion site clean and dry, site mildly tender to palpation, output 20 cc of reddish-yellow fluid; drain irrigated without difficulty  Imaging: CT ABDOMEN PELVIS W CONTRAST  Result Date: 04/12/2021 CLINICAL DATA:  Status post ventral hernia repair. Still having abdominal pain. EXAM: CT ABDOMEN AND PELVIS WITH CONTRAST TECHNIQUE: Multidetector CT imaging of the abdomen and pelvis was performed using the standard protocol following bolus administration of  intravenous contrast. CONTRAST:  OMNIPAQUE IOHEXOL 300 MG/ML  SOLN COMPARISON:  CT abdomen pelvis 04/07/2021 FINDINGS: Lower chest: Bibasilar linear atelectasis. Hepatobiliary: Several fluid density lesions within the liver appears similar to prior and likely/statistically represents simple hepatic cyst. Subcentimeter hypodensities are too small to characterize. No focal liver abnormality. No  gallstones, gallbladder wall thickening, or pericholecystic fluid. No biliary dilatation. Pancreas: No focal lesion. Normal pancreatic contour. No surrounding inflammatory changes. No main pancreatic ductal dilatation. Spleen: Normal in size without focal abnormality. Adrenals/Urinary Tract: No adrenal nodule bilaterally. Bilateral kidneys enhance symmetrically. No hydronephrosis. No hydroureter. The urinary bladder is unremarkable. Stomach/Bowel: PO contrast reaches the colon. No extravasation of PO contrast. Right lower quadrant small bowel resection surgical changes again noted. Stomach is within normal limits. Slightly distended couple loops of small bowel with associated surrounding fat stranding. No pneumatosis. No evidence of bowel wall thickening or dilatation. Appendix appears normal. Vascular/Lymphatic: No abdominal aorta or iliac aneurysm. Mild atherosclerotic plaque of the aorta and its branches. No abdominal, pelvic, or inguinal lymphadenopathy. Reproductive: Prostate is unremarkable. Other: No intraperitoneal free gas. Trace simple free pelvic fluid. Interval development of a lobulated peripherally enhancing fluid collection measuring up to 5.8 x 2.8 cm (3:82) within the pelvis centered in the rectovesical pouch. Musculoskeletal: Surgical changes related to an anterior abdominal wall ventral hernia repair. Penrose drain again noted within the deep subcutaneus soft tissues just anterior to the peritoneum. Interval development of an associated anterior abdominal wall fluid and gas collection along the Penrose drain. The collection measures approximately 7 x 4.5 x 2 cm with no definite enterocutaneous fistula with the adjacent partially PO contrast filled small bowel noted just inferior to the peritoneum. This subcutaneus soft tissue fluid collection is noted to extend to the nervous along the skin staples (3:59). The fluid collection is also noted to extends slightly superiorly with a very thin fluid  collection with limited evaluation due to motion artifact (7:73). Slight surrounding subcutaneus soft tissue edema/fat stranding but no definite peripheral enhancement. No suspicious lytic or blastic osseous lesions. No acute displaced fracture. Multilevel degenerative changes of the spine. IMPRESSION: 1. Interval development of a 5.8 x 2.8 cm rectovesical pouch abscess. 2. Status post ventral wall hernia repair with interval development of an associated 7 x 4.5 x 2 cm abdominal wall fluid and gas collection with no definite peripheral enhancement along a subcutaneus soft tissue Penrose drain. The collection is contiguous with the superficial dermal incision. There is no definite enterocutaneous fistula with the subjacent small bowel located just inferior to the peritoneum. Differential diagnosis includes seroma versus developing abscess. 3. Improving postoperative ileus. Electronically Signed   By: Tish Frederickson M.D.   On: 04/12/2021 20:25   CT IMAGE GUIDED DRAINAGE BY PERCUTANEOUS CATHETER  Result Date: 04/13/2021 INDICATION: 70 year old male with a rim enhancing pelvic fluid collection following ventral hernia repair. In the setting of leukocytosis, findings are concerning for possible abscess. He presents for CT-guided drain placement. EXAM: CT-guided drain placement MEDICATIONS: The patient is currently admitted to the hospital and receiving intravenous antibiotics. The antibiotics were administered within an appropriate time frame prior to the initiation of the procedure. ANESTHESIA/SEDATION: 100 mcg fentanyl COMPLICATIONS: None immediate. PROCEDURE: Informed written consent was obtained from the patient after a thorough discussion of the procedural risks, benefits and alternatives. All questions were addressed. Maximal Sterile Barrier Technique was utilized including caps, mask, sterile gowns, sterile gloves, sterile drape, hand hygiene and skin antiseptic. A timeout was performed prior to the initiation  of the procedure. A planning axial CT scan was performed. The deep pelvic fluid collection was successfully visualized. The rectum was mildly dilated with gas and blocking the window into the fluid collection. Therefore, the patient was allowed to walk and attempts were made to evacuate his bowels. Ultimately, he was able to pass flatus and imaging was again obtained. With the rectum somewhat decompressed, a suitable window is now visible. A skin entry site was selected and marked. Local anesthesia was attained by infiltration with 1% lidocaine. A small dermatotomy was made. Under intermittent CT guidance, an 18 gauge trocar needle was advanced along a right parasacral trajectory between the rectum in the adjacent iliac vessels and into the pelvic fluid collection. A 0.035 wire was coiled in the fluid collection. The skin tract was dilated to 10 [place]. A flexible a 10 [place] all-purpose drainage catheter was advanced over the wire and formed in the fluid collection. Aspiration yields approximately 10 mL of serosanguineous fluid and debris. This was sent for Gram stain and culture. The catheter was gently flushed and connected to JP bulb suction before being secured to the skin with 0 Prolene suture. Final CT imaging demonstrates a well-positioned drainage catheter. No evidence of immediate complication. IMPRESSION: Successful placement of a 10 French drainage catheter via a right transgluteal approach with evacuation of 10 mL turbid serosanguineous fluid. Electronically Signed   By: Malachy MoanHeath  McCullough M.D.   On: 04/13/2021 15:09    Labs:  CBC: Recent Labs    04/10/21 0318 04/11/21 0302 04/12/21 1128 04/14/21 0421  WBC 13.5* 13.7* 17.4* 12.0*  HGB 11.4* 11.1* 12.1* 10.9*  HCT 36.4* 36.6* 39.2 35.4*  PLT 355 394 457* 422*    COAGS: Recent Labs    07/27/20 0325 08/12/20 0709 12/14/20 0903 04/13/21 1435  INR 1.1 1.1 1.1 1.1  APTT  --  30 30  --     BMP: Recent Labs    04/10/21 0318  04/11/21 0302 04/13/21 0324 04/14/21 0421  NA 137 137 134* 137  K 4.2 4.4 4.2 4.2  CL 104 105 102 105  CO2 27 26 26 26   GLUCOSE 131* 119* 124* 112*  BUN 8 10 12 16   CALCIUM 8.1* 8.2* 8.3* 8.5*  CREATININE 0.65 0.62 0.83 0.82  GFRNONAA >60 >60 >60 >60    LIVER FUNCTION TESTS: Recent Labs    04/04/21 1240 04/08/21 0432 04/11/21 0302 04/14/21 0421  BILITOT 0.9 0.6 0.4 0.4  AST 44* 19 17 22   ALT 49* 14 12 21   ALKPHOS 76 63 62 74  PROT 7.7 5.8* 6.1* 6.7  ALBUMIN 3.7 2.4* 2.4* 2.6*    Assessment and Plan: 70 y.o. male who is status post retrovascular recurrent incisional hernia repair with Vicryl mesh, reconstruction of the posterior layer and Phasix absorbable retrovascular mesh, and small bowel resection on 04/04/2021; now with post op pelvic/rectovesical pouch fluid collection, status post drain placement on 7/6; afebrile, WBC 12 down from 17.4, hemoglobin 10.9 down from 12.1, creatinine normal, drain fluid cultures pending; continue with current treatment/drain output monitoring/gentle irrigation/lab checks.  Send drain fluid for creatinine level.  Once drain output less than 10 to 15 cc over 2 to 3-day span or if WBC increases recommend follow-up CT; additional plans as per [facility].   Electronically Signed: D. Jeananne RamaKevin Damarius Karnes, PA-C 04/14/2021, 1:16 PM   I spent a total of 15 Minutes at the the patient's bedside AND on the patient's hospital floor or unit, greater than 50% of which was counseling/coordinating  care for pelvic fluid collection drain    Patient ID: Brian Atkinson, male   DOB: Aug 01, 1951, 70 y.o.   MRN: [id number]

## 2021-04-14 NOTE — Progress Notes (Signed)
Nutrition Follow-up  DOCUMENTATION CODES:   Obesity unspecified, Non-severe (moderate) malnutrition in context of acute illness/injury  INTERVENTION:   -Ensure Enlive po BID, each supplement provides 350 kcal and 20 grams of protein   NUTRITION DIAGNOSIS:   Moderate Malnutrition related to acute illness (s/p hernia repair, SBO) as evidenced by moderate fat depletion, mild muscle depletion, energy intake < 75% for > 7 days.  Ongoing.  GOAL:   Patient will meet greater than or equal to 90% of their needs  Progressing.  MONITOR:   Diet advancement, Labs, Weight trends, I & O's (TPN)  ASSESSMENT:   70 y.o. male who is here for nausea, vomiting and abdominal pain after ventral hernia repair.  Patient weaning off TPN today. Now on soft diet. Consuming 100% of meals at this time. Will order Ensure supplements for additional kcals and protein. Per chart review, pt may discharge 7/8.  Admission weight: 227 lbs. No other weights this admission.  Medications: Colace, Miralax   Labs reviewed:  CBGs: 107-116   Diet Order:   Diet Order             DIET SOFT Room service appropriate? Yes; Fluid consistency: Thin  Diet effective now                   EDUCATION NEEDS:   Education needs have been addressed  Skin:  Skin Assessment: Reviewed RN Assessment  Last BM:  6/19  Height:   Ht Readings from Last 1 Encounters:  04/04/21 5' 11.5" (1.816 m)    Weight:   Wt Readings from Last 1 Encounters:  04/04/21 103 kg    BMI:  Body mass index is 31.23 kg/m.  Estimated Nutritional Needs:   Kcal:  2150-2350  Protein:  100-115g  Fluid:  2.3L/day   Tilda Franco, MS, RD, LDN Inpatient Clinical Dietitian Contact information available via Amion

## 2021-04-14 NOTE — Progress Notes (Signed)
Progress Note  10 Days Post-Op  Subjective: Patient denies abdominal pain this AM. Denies nausea. Last BM 2 days ago, he would like to get up and walk around today. Tolerating soft diet. Discussed possible discharge tomorrow.   Objective: Vital signs in last 24 hours: Temp:  [97.4 F (36.3 C)-98.6 F (37 C)] 98.6 F (37 C) (07/07 0542) Pulse Rate:  [74-99] 74 (07/07 0542) Resp:  [12-22] 17 (07/07 0542) BP: (120-158)/(20-92) 127/74 (07/07 0542) SpO2:  [95 %-100 %] 98 % (07/07 0542) Last BM Date: 04/11/21  Intake/Output from previous day: 07/06 0701 - 07/07 0700 In: 2342.3 [P.O.:840; I.V.:1302.3; IV Piggyback:200] Out: 1880 [Urine:1850; Drains:30] Intake/Output this shift: No intake/output data recorded.  PE: General: pleasant, WD, overweight male who is laying in bed in NAD Heart: regular, rate, and rhythm.   Lungs: CTAB, no wheezes, rhonchi, or rales noted.  Respiratory effort nonlabored Abd: soft, NT, moderately distended, +BS, surgical drain with SS fluid, IR drain with SS appearing fluid, incision with staples present and penrose inferiorly with some purulent appearing drainage, no cellulitis around incision    Lab Results:  Recent Labs    04/12/21 1128 04/14/21 0421  WBC 17.4* 12.0*  HGB 12.1* 10.9*  HCT 39.2 35.4*  PLT 457* 422*   BMET Recent Labs    04/13/21 0324 04/14/21 0421  NA 134* 137  K 4.2 4.2  CL 102 105  CO2 26 26  GLUCOSE 124* 112*  BUN 12 16  CREATININE 0.83 0.82  CALCIUM 8.3* 8.5*   PT/INR Recent Labs    04/13/21 1435  LABPROT 14.3  INR 1.1   CMP     Component Value Date/Time   NA 137 04/14/2021 0421   K 4.2 04/14/2021 0421   CL 105 04/14/2021 0421   CO2 26 04/14/2021 0421   GLUCOSE 112 (H) 04/14/2021 0421   BUN 16 04/14/2021 0421   CREATININE 0.82 04/14/2021 0421   CREATININE 0.98 04/17/2017 1049   CALCIUM 8.5 (L) 04/14/2021 0421   PROT 6.7 04/14/2021 0421   ALBUMIN 2.6 (L) 04/14/2021 0421   AST 22 04/14/2021 0421    ALT 21 04/14/2021 0421   ALKPHOS 74 04/14/2021 0421   BILITOT 0.4 04/14/2021 0421   GFRNONAA >60 04/14/2021 0421   GFRAA >60 09/25/2017 0648   Lipase     Component Value Date/Time   LIPASE 26 04/04/2021 1240       Studies/Results: CT ABDOMEN PELVIS W CONTRAST  Result Date: 04/12/2021 CLINICAL DATA:  Status post ventral hernia repair. Still having abdominal pain. EXAM: CT ABDOMEN AND PELVIS WITH CONTRAST TECHNIQUE: Multidetector CT imaging of the abdomen and pelvis was performed using the standard protocol following bolus administration of intravenous contrast. CONTRAST:  OMNIPAQUE IOHEXOL 300 MG/ML  SOLN COMPARISON:  CT abdomen pelvis 04/07/2021 FINDINGS: Lower chest: Bibasilar linear atelectasis. Hepatobiliary: Several fluid density lesions within the liver appears similar to prior and likely/statistically represents simple hepatic cyst. Subcentimeter hypodensities are too small to characterize. No focal liver abnormality. No gallstones, gallbladder wall thickening, or pericholecystic fluid. No biliary dilatation. Pancreas: No focal lesion. Normal pancreatic contour. No surrounding inflammatory changes. No main pancreatic ductal dilatation. Spleen: Normal in size without focal abnormality. Adrenals/Urinary Tract: No adrenal nodule bilaterally. Bilateral kidneys enhance symmetrically. No hydronephrosis. No hydroureter. The urinary bladder is unremarkable. Stomach/Bowel: PO contrast reaches the colon. No extravasation of PO contrast. Right lower quadrant small bowel resection surgical changes again noted. Stomach is within normal limits. Slightly distended couple loops of  small bowel with associated surrounding fat stranding. No pneumatosis. No evidence of bowel wall thickening or dilatation. Appendix appears normal. Vascular/Lymphatic: No abdominal aorta or iliac aneurysm. Mild atherosclerotic plaque of the aorta and its branches. No abdominal, pelvic, or inguinal lymphadenopathy.  Reproductive: Prostate is unremarkable. Other: No intraperitoneal free gas. Trace simple free pelvic fluid. Interval development of a lobulated peripherally enhancing fluid collection measuring up to 5.8 x 2.8 cm (3:82) within the pelvis centered in the rectovesical pouch. Musculoskeletal: Surgical changes related to an anterior abdominal wall ventral hernia repair. Penrose drain again noted within the deep subcutaneus soft tissues just anterior to the peritoneum. Interval development of an associated anterior abdominal wall fluid and gas collection along the Penrose drain. The collection measures approximately 7 x 4.5 x 2 cm with no definite enterocutaneous fistula with the adjacent partially PO contrast filled small bowel noted just inferior to the peritoneum. This subcutaneus soft tissue fluid collection is noted to extend to the nervous along the skin staples (3:59). The fluid collection is also noted to extends slightly superiorly with a very thin fluid collection with limited evaluation due to motion artifact (7:73). Slight surrounding subcutaneus soft tissue edema/fat stranding but no definite peripheral enhancement. No suspicious lytic or blastic osseous lesions. No acute displaced fracture. Multilevel degenerative changes of the spine. IMPRESSION: 1. Interval development of a 5.8 x 2.8 cm rectovesical pouch abscess. 2. Status post ventral wall hernia repair with interval development of an associated 7 x 4.5 x 2 cm abdominal wall fluid and gas collection with no definite peripheral enhancement along a subcutaneus soft tissue Penrose drain. The collection is contiguous with the superficial dermal incision. There is no definite enterocutaneous fistula with the subjacent small bowel located just inferior to the peritoneum. Differential diagnosis includes seroma versus developing abscess. 3. Improving postoperative ileus. Electronically Signed   By: Tish Frederickson M.D.   On: 04/12/2021 20:25   CT IMAGE GUIDED  DRAINAGE BY PERCUTANEOUS CATHETER  Result Date: 04/13/2021 INDICATION: 70 year old male with a rim enhancing pelvic fluid collection following ventral hernia repair. In the setting of leukocytosis, findings are concerning for possible abscess. He presents for CT-guided drain placement. EXAM: CT-guided drain placement MEDICATIONS: The patient is currently admitted to the hospital and receiving intravenous antibiotics. The antibiotics were administered within an appropriate time frame prior to the initiation of the procedure. ANESTHESIA/SEDATION: 100 mcg fentanyl COMPLICATIONS: None immediate. PROCEDURE: Informed written consent was obtained from the patient after a thorough discussion of the procedural risks, benefits and alternatives. All questions were addressed. Maximal Sterile Barrier Technique was utilized including caps, mask, sterile gowns, sterile gloves, sterile drape, hand hygiene and skin antiseptic. A timeout was performed prior to the initiation of the procedure. A planning axial CT scan was performed. The deep pelvic fluid collection was successfully visualized. The rectum was mildly dilated with gas and blocking the window into the fluid collection. Therefore, the patient was allowed to walk and attempts were made to evacuate his bowels. Ultimately, he was able to pass flatus and imaging was again obtained. With the rectum somewhat decompressed, a suitable window is now visible. A skin entry site was selected and marked. Local anesthesia was attained by infiltration with 1% lidocaine. A small dermatotomy was made. Under intermittent CT guidance, an 18 gauge trocar needle was advanced along a right parasacral trajectory between the rectum in the adjacent iliac vessels and into the pelvic fluid collection. A 0.035 wire was coiled in the fluid collection. The skin tract was  dilated to 10 [place]. A flexible a 10 [place] all-purpose drainage catheter was advanced over the wire and formed in the fluid  collection. Aspiration yields approximately 10 mL of serosanguineous fluid and debris. This was sent for Gram stain and culture. The catheter was gently flushed and connected to JP bulb suction before being secured to the skin with 0 Prolene suture. Final CT imaging demonstrates a well-positioned drainage catheter. No evidence of immediate complication. IMPRESSION: Successful placement of a 10 French drainage catheter via a right transgluteal approach with evacuation of 10 mL turbid serosanguineous fluid. Electronically Signed   By: Malachy Moan M.D.   On: 04/13/2021 15:09    Anti-infectives: Anti-infectives (From admission, onward)    Start     Dose/Rate Route Frequency Ordered Stop   04/14/21 1030  amoxicillin-clavulanate (AUGMENTIN) 875-125 MG per tablet 1 tablet        1 tablet Oral Every 12 hours 04/14/21 0944     04/07/21 1400  piperacillin-tazobactam (ZOSYN) IVPB 3.375 g  Status:  Discontinued        3.375 g 12.5 mL/hr over 240 Minutes Intravenous Every 8 hours 04/07/21 0938 04/14/21 0944   04/05/21 0200  ceFAZolin (ANCEF) IVPB 1 g/50 mL premix  Status:  Discontinued        1 g 100 mL/hr over 30 Minutes Intravenous Every 8 hours 04/04/21 1948 04/07/21 0938   04/04/21 2200  metroNIDAZOLE (FLAGYL) IVPB 500 mg  Status:  Discontinued        500 mg 100 mL/hr over 60 Minutes Intravenous Every 8 hours 04/04/21 1948 04/07/21 0938   04/04/21 1745  ceFAZolin (ANCEF) IVPB 2g/100 mL premix        2 g 200 mL/hr over 30 Minutes Intravenous  Once 04/04/21 1730 04/04/21 1815   04/04/21 1715  ceFAZolin (ANCEF) 2-4 GM/100ML-% IVPB       Note to Pharmacy: Sharyn Creamer   : cabinet override      04/04/21 1715 04/04/21 1818        Assessment/Plan POD#17/10 status post retromuscular recurrent incisional hernia repair with vicryl mesh, reconstruction of the posterior layer and Phasix absorbable retromuscular mesh, small bowel resection - Dr. Dossie Der 04-04-21 - s/p IR drain placement for  rectovesical abscess 7/6 - WBC 12, fluid in drain SS - incision with penrose inferiorly and draining some purulent material - surgical drain with SS fluid - remove this drain today  - continue soft diet, add colace and miralax - continue to mobilize - change IV zosyn to PO augmentin - discontinue TPN and remove PICC - likely home tomorrow if doing well    FEN: soft diet, DC TPN VTE: LMWH ID: Zosyn 6/30>7/7; PO augmentin 7/7>>  LOS: 10 days    Juliet Rude, [facility] 04/14/2021, 9:45 AM Please see Amion for pager number during day hours 7:00am-4:30pm

## 2021-04-14 NOTE — Care Management Important Message (Signed)
Important Message  Patient Details IM Letter given to the Patient. Name: Brian Atkinson MRN: [id number] Date of Birth: Oct 12, 1950   Medicare Important Message Given:  Yes     Caren Macadam 04/14/2021, 10:27 AM

## 2021-04-14 NOTE — Progress Notes (Signed)
PHARMACY - TOTAL PARENTERAL NUTRITION CONSULT NOTE   Indication: SBO  Patient Measurements: Height: 5' 11.5" (181.6 cm) Weight: 103 kg (227 lb 1.2 oz) IBW/kg (Calculated) : 76.45 TPN AdjBW (KG): 83.1 Body mass index is 31.23 kg/m.   Assessment: Patient is a 70 y.o M with hx recurrent ventral hernia surgery (s/p umbilical hernia repair on 03/28/21), presented to the ED on 6/27 with c/o n/v and constiptation. Abdominal CT on 6/27 showed SBO and findings with concern for abscess at the hernia repair surgical area.  He underwent exp lap and small bowel resection with mesh on 04/04/21. He was started on TPN on 6/30.  Glucose / Insulin: No hx DM. On sensitive SSI achs - has not used any insulin the past 24 hrs -CBG (goal <150): wnl Electrolytes: All lytes WNL Renal: SCr, BUN WNL and stable Hepatic: LFTs wnl TG: 93 (7/1), 106 (7/4) Pre-Albumin: 5.6 (7/1), inc to 9.9 (7/4) Intake / Output; MIVF: - MIVF: D5 1/2 NS at 10 mL/hr -  I/O: +462 ml - drain 30 ml - UOP 1850 mL GI Imaging: -6/30 CT Abd: persistent SBO vs post-op ileus GI Surgeries / Procedures:   -6/20 repair recurrent ventral hernia w/mesh.  -6/27 Ex lap, small bowel resection w/mesh -7/6 IR consult for percutaneous drainage, advanced to soft diet once seen by IR and can figure out if he can have drain placed per notes  Central access: Double lumen PICC TPN start date: 6/30  Nutritional Goals (per RD recommendation on 6/30): kCal: 2150-2350, Protein: 100-115gm, Fluid: 2.3 L per day   Goal TPN rate is 100 mL/hr (provides 110 g of protein, 360 g dextrose, and 2194 kcals per day)  Current Nutrition: - TPN - 7/4 tolerated CLD and advanced to FLD today. - 7/6: adv diet as tolerated  Plan:  - per [facility], d/c TPN today (7/7) - Reduce TPN by half to 50 ml/hr and run at this rate for two hrs, then d/c TPN - pharmacy will sign off. Re-consult [place] if need further assistance  Dorna Leitz, PharmD, BCPS 04/14/2021 7:23 AM

## 2021-04-15 ENCOUNTER — Other Ambulatory Visit: Payer: Self-pay

## 2021-04-15 ENCOUNTER — Inpatient Hospital Stay (HOSPITAL_COMMUNITY)
Admission: EM | Admit: 2021-04-15 | Discharge: 2021-05-01 | DRG: 799 | Disposition: A | Discharge status: Home / Self Care | Payer: Medicare Other | Attending: Surgery | Admitting: Surgery

## 2021-04-15 ENCOUNTER — Other Ambulatory Visit (HOSPITAL_COMMUNITY): Payer: Self-pay

## 2021-04-15 DIAGNOSIS — N179 Acute kidney failure, unspecified: Secondary | ICD-10-CM | POA: Diagnosis present

## 2021-04-15 DIAGNOSIS — D638 Anemia in other chronic diseases classified elsewhere: Secondary | ICD-10-CM | POA: Diagnosis present

## 2021-04-15 DIAGNOSIS — E860 Dehydration: Secondary | ICD-10-CM | POA: Diagnosis present

## 2021-04-15 DIAGNOSIS — R739 Hyperglycemia, unspecified: Secondary | ICD-10-CM | POA: Diagnosis not present

## 2021-04-15 DIAGNOSIS — Z6831 Body mass index (BMI) 31.0-31.9, adult: Secondary | ICD-10-CM

## 2021-04-15 DIAGNOSIS — D75838 Other thrombocytosis: Secondary | ICD-10-CM | POA: Diagnosis not present

## 2021-04-15 DIAGNOSIS — R0602 Shortness of breath: Secondary | ICD-10-CM

## 2021-04-15 DIAGNOSIS — E871 Hypo-osmolality and hyponatremia: Secondary | ICD-10-CM | POA: Diagnosis not present

## 2021-04-15 DIAGNOSIS — R410 Disorientation, unspecified: Secondary | ICD-10-CM | POA: Diagnosis not present

## 2021-04-15 DIAGNOSIS — I959 Hypotension, unspecified: Secondary | ICD-10-CM | POA: Diagnosis present

## 2021-04-15 DIAGNOSIS — D735 Infarction of spleen: Principal | ICD-10-CM | POA: Diagnosis present

## 2021-04-15 DIAGNOSIS — J9811 Atelectasis: Secondary | ICD-10-CM | POA: Diagnosis not present

## 2021-04-15 DIAGNOSIS — Z781 Physical restraint status: Secondary | ICD-10-CM

## 2021-04-15 DIAGNOSIS — E669 Obesity, unspecified: Secondary | ICD-10-CM | POA: Diagnosis present

## 2021-04-15 DIAGNOSIS — J90 Pleural effusion, not elsewhere classified: Secondary | ICD-10-CM

## 2021-04-15 DIAGNOSIS — K567 Ileus, unspecified: Secondary | ICD-10-CM | POA: Diagnosis not present

## 2021-04-15 DIAGNOSIS — Z79899 Other long term (current) drug therapy: Secondary | ICD-10-CM

## 2021-04-15 DIAGNOSIS — D62 Acute posthemorrhagic anemia: Secondary | ICD-10-CM | POA: Diagnosis not present

## 2021-04-15 DIAGNOSIS — Z20822 Contact with and (suspected) exposure to covid-19: Secondary | ICD-10-CM | POA: Diagnosis present

## 2021-04-15 DIAGNOSIS — K661 Hemoperitoneum: Secondary | ICD-10-CM

## 2021-04-15 DIAGNOSIS — S3609XA Other injury of spleen, initial encounter: Secondary | ICD-10-CM

## 2021-04-15 DIAGNOSIS — R109 Unspecified abdominal pain: Secondary | ICD-10-CM

## 2021-04-15 DIAGNOSIS — E440 Moderate protein-calorie malnutrition: Secondary | ICD-10-CM | POA: Diagnosis present

## 2021-04-15 DIAGNOSIS — Z888 Allergy status to other drugs, medicaments and biological substances status: Secondary | ICD-10-CM

## 2021-04-15 DIAGNOSIS — Z9889 Other specified postprocedural states: Secondary | ICD-10-CM

## 2021-04-15 DIAGNOSIS — D72829 Elevated white blood cell count, unspecified: Secondary | ICD-10-CM | POA: Diagnosis present

## 2021-04-15 DIAGNOSIS — K651 Peritoneal abscess: Secondary | ICD-10-CM | POA: Diagnosis present

## 2021-04-15 DIAGNOSIS — Z8614 Personal history of Methicillin resistant Staphylococcus aureus infection: Secondary | ICD-10-CM

## 2021-04-15 LAB — CBC
HCT: 37.7 % — ABNORMAL LOW (ref 39.0–52.0)
Hemoglobin: 11.5 g/dL — ABNORMAL LOW (ref 13.0–17.0)
MCH: 26.8 pg (ref 26.0–34.0)
MCHC: 30.5 g/dL (ref 30.0–36.0)
MCV: 87.9 fL (ref 80.0–100.0)
Platelets: 467 10*3/uL — ABNORMAL HIGH (ref 150–400)
RBC: 4.29 MIL/uL (ref 4.22–5.81)
RDW: 15.3 % (ref 11.5–15.5)
WBC: 12.5 10*3/uL — ABNORMAL HIGH (ref 4.0–10.5)
nRBC: 0 % (ref 0.0–0.2)

## 2021-04-15 LAB — GLUCOSE, CAPILLARY: Glucose-Capillary: 101 mg/dL — ABNORMAL HIGH (ref 70–99)

## 2021-04-15 MED ORDER — SODIUM CHLORIDE 0.9% FLUSH: 5.0000 mL | Freq: Every day | INTRAVENOUS | 2 refills | Status: AC

## 2021-04-15 MED ORDER — DOCUSATE SODIUM 100 MG PO CAPS
100.0000 mg | ORAL_CAPSULE | Freq: Two times a day (BID) | ORAL | 0 refills | Status: AC
  Filled 2021-04-15: qty 20, 10d supply, fill #0

## 2021-04-15 MED ORDER — AMOXICILLIN-POT CLAVULANATE 875-125 MG PO TABS
1.0000 | ORAL_TABLET | Freq: Two times a day (BID) | ORAL | 0 refills | Status: DC
  Filled 2021-04-15: qty 14, 7d supply, fill #0

## 2021-04-15 MED ORDER — OXYCODONE HCL 5 MG PO TABS
5.0000 mg | ORAL_TABLET | ORAL | 0 refills | Status: AC | PRN
  Filled 2021-04-15: qty 30, 5d supply, fill #0

## 2021-04-15 MED ORDER — PANTOPRAZOLE SODIUM 40 MG PO TBEC
40.0000 mg | DELAYED_RELEASE_TABLET | Freq: Two times a day (BID) | ORAL | 0 refills | Status: AC
  Filled 2021-04-15: qty 30, 15d supply, fill #0

## 2021-04-15 MED ORDER — POLYETHYLENE GLYCOL 3350 17 G PO PACK
17.0000 g | PACK | Freq: Every day | ORAL | 0 refills | Status: AC
  Filled 2021-04-15: qty 14, 14d supply, fill #0

## 2021-04-15 MED ORDER — ACETAMINOPHEN 500 MG PO TABS: 1000.0000 mg | ORAL_TABLET | Freq: Four times a day (QID) | ORAL | Status: AC | PRN

## 2021-04-15 MED ORDER — GABAPENTIN 300 MG PO CAPS
300.0000 mg | ORAL_CAPSULE | Freq: Three times a day (TID) | ORAL | 0 refills | Status: AC
  Filled 2021-04-15: qty 60, 20d supply, fill #0

## 2021-04-15 NOTE — ED Triage Notes (Signed)
Pt came in with c/o syncope, dizziness, and weakness that started at approx 1700 tonight. Pt had hernia surgery on 6/27. He had a drain removed around 1300 today. Pt is tachycardic at 120 and B/P 93/60. Pt pale and diaphoretic

## 2021-04-15 NOTE — Discharge Summary (Signed)
[facility] Discharge Summary   Patient ID: Brian Atkinson MRN: [id number] DOB/AGE: September 05, 1951 70 y.o.  Admit date: 04/04/2021 Discharge date: 04/15/2021  Admitting Diagnosis: Post-operative ileus vs SBO  Discharge Diagnosis POD#18/11 status post retromuscular recurrent incisional hernia repair with vicryl mesh, reconstruction of the posterior layer and Phasix absorbable retromuscular mesh, small bowel resection  Consultants IR  Imaging: CT IMAGE GUIDED DRAINAGE BY PERCUTANEOUS CATHETER  Result Date: 04/13/2021 INDICATION: 70 year old male with a rim enhancing pelvic fluid collection following ventral hernia repair. In the setting of leukocytosis, findings are concerning for possible abscess. He presents for CT-guided drain placement. EXAM: CT-guided drain placement MEDICATIONS: The patient is currently admitted to the hospital and receiving intravenous antibiotics. The antibiotics were administered within an appropriate time frame prior to the initiation of the procedure. ANESTHESIA/SEDATION: 100 mcg fentanyl COMPLICATIONS: None immediate. PROCEDURE: Informed written consent was obtained from the patient after a thorough discussion of the procedural risks, benefits and alternatives. All questions were addressed. Maximal Sterile Barrier Technique was utilized including caps, mask, sterile gowns, sterile gloves, sterile drape, hand hygiene and skin antiseptic. A timeout was performed prior to the initiation of the procedure. A planning axial CT scan was performed. The deep pelvic fluid collection was successfully visualized. The rectum was mildly dilated with gas and blocking the window into the fluid collection. Therefore, the patient was allowed to walk and attempts were made to evacuate his bowels. Ultimately, he was able to pass flatus and imaging was again obtained. With the rectum somewhat decompressed, a suitable window is now visible. A skin entry site was selected and  marked. Local anesthesia was attained by infiltration with 1% lidocaine. A small dermatotomy was made. Under intermittent CT guidance, an 18 gauge trocar needle was advanced along a right parasacral trajectory between the rectum in the adjacent iliac vessels and into the pelvic fluid collection. A 0.035 wire was coiled in the fluid collection. The skin tract was dilated to 10 [place]. A flexible a 10 [place] all-purpose drainage catheter was advanced over the wire and formed in the fluid collection. Aspiration yields approximately 10 mL of serosanguineous fluid and debris. This was sent for Gram stain and culture. The catheter was gently flushed and connected to JP bulb suction before being secured to the skin with 0 Prolene suture. Final CT imaging demonstrates a well-positioned drainage catheter. No evidence of immediate complication. IMPRESSION: Successful placement of a 10 French drainage catheter via a right transgluteal approach with evacuation of 10 mL turbid serosanguineous fluid. Electronically Signed   By: Brian Atkinson M.D.   On: 04/13/2021 15:09    Procedures Dr. Renae Fickle Atkinson (03/28/21) - REPAIR OF RECURRENT VENTRAL HERNIA WITH MESH, Bilateral rectus myofascial release, OPEN EXCISION OF INTRABDOMINAL MESH  Dr. Renae Fickle Atkinson (04/04/21) - retromuscular recurrent incisional hernia repair with vicryl mesh, reconstruction of the posterior layer and Phasix absorbable retromuscular mesh, small bowel resection  Dr. Malachy Atkinson (04/13/21) - CT guided drain placement   Hospital Course:  Patient is a 70 year old male s/p ventral hernia repair who presented to [facility] with nausea/vomiting. Patient was admitted. CT showed recurrent hernia and patient underwent procedure listed above.  Tolerated procedure well and was transferred to the floor. Patient developed post-operative ileus  and required TPN. Ileus improved and diet was advanced as tolerated. Patient had increased leukocytosis 7/5 and CT  showed rectovesical abscess. IR consulted for percutaneous drainage. Surgical drain removed 7/7. Penrose drain removed from incision prior to discharge. On POD18/11, the  patient was voiding well, tolerating diet, ambulating well, pain well controlled, vital signs stable, incisions c/d/i and felt stable for discharge home.  Patient will follow up in our office in 1 weeks and knows to call with questions or concerns.  He will call to confirm appointment date/time.    Physical Exam: General: pleasant, WD, overweight male who is laying in bed in NAD Heart: regular, rate, and rhythm.   Lungs: CTAB, no wheezes, rhonchi, or rales noted.  Respiratory effort nonlabored Abd: soft, NT, moderately distended, +BS, IR drain with SS appearing fluid, incision with staples present and penrose inferiorly with some purulent appearing drainage, no cellulitis around incision   I or a member of my team have reviewed this patient in the Controlled Substance Database.   Allergies as of 04/15/2021       Reactions   Versed [midazolam] Other (See Comments)   Memory problems        Medication List     STOP taking these medications    oxyCODONE-acetaminophen 5-325 MG tablet Commonly known as: Percocet       TAKE these medications    acetaminophen 500 MG tablet Commonly known as: TYLENOL Take 2 tablets (1,000 mg total) by mouth every 6 (six) hours as needed for mild pain or fever. What changed:  how much to take how to take this when to take this reasons to take this   amoxicillin-clavulanate 875-125 MG tablet Commonly known as: AUGMENTIN Take 1 tablet by mouth every 12 (twelve) hours for 7 days.   cetirizine 10 MG tablet Commonly known as: ZYRTEC Take 10 mg by mouth daily as needed for allergies.   docusate sodium 100 MG capsule Commonly known as: COLACE Take 1 capsule (100 mg total) by mouth 2 (two) times daily.   gabapentin 300 MG capsule Commonly known as: NEURONTIN Take 1 capsule (300 mg  total) by mouth 3 (three) times daily.   One-A-Day Mens 50+ Tabs Take 1 tablet by mouth daily.   oxyCODONE 5 MG immediate release tablet Commonly known as: Oxy IR/ROXICODONE Take 1 tablet (5 mg total) by mouth every 4 (four) hours as needed for moderate pain.   pantoprazole 40 MG tablet Commonly known as: PROTONIX Take 1 tablet (40 mg total) by mouth 2 (two) times daily.   polyethylene glycol 17 g packet Commonly known as: MIRALAX / GLYCOLAX Take 17 g by mouth daily.   sodium chloride flush 0.9 % Soln Commonly known as: NS 5 mLs by Intracatheter route daily.          Follow-up Information     Atkinson, Hyman Hopes, MD. Go on 04/22/2021.   Specialty: Surgery Why: As scheduled for follow up - 2:10 PM. Please arrive 30 min prior to appointment time for check in Contact information: [address] [place] [address] [phone]         Sterling Big, MD. Schedule an appointment as soon as possible for a visit in 2 week(s).   Specialties: Interventional Radiology, Radiology Why: Follow up in 1-2 weeks to check drain and possibly remove Contact information: [address] [place] [address] [phone]                 Signed: Juliet Rude , [facility] 04/15/2021, 9:31 AM Please see Amion for pager number during day hours 7:00am-4:30pm

## 2021-04-15 NOTE — Discharge Instructions (Signed)
[facility]      [facility] °[phone] ° °OPEN ABDOMINAL SURGERY: POST OP INSTRUCTIONS ° °Always review your discharge instruction sheet given to you by the facility where your surgery was performed. ° °IF YOU HAVE DISABILITY OR FAMILY LEAVE FORMS, YOU MUST BRING THEM TO THE OFFICE FOR PROCESSING.  PLEASE DO NOT GIVE THEM TO YOUR DOCTOR. ° °A prescription for pain medication may be given to you upon discharge.  Take your pain medication as prescribed, if needed.  If narcotic pain medicine is not needed, then you may take acetaminophen (Tylenol) or ibuprofen (Advil) as needed. °Take your usually prescribed medications unless otherwise directed. °If you need a refill on your pain medication, please contact your pharmacy. They will contact our office to request authorization.  Prescriptions will not be filled after 5pm or on week-ends. °You should follow a light diet the first few days after arrival home, such as soup and crackers, pudding, etc.unless your doctor has advised otherwise. A high-fiber, low fat diet can be resumed as tolerated.   Be sure to include lots of fluids daily. Most patients will experience some swelling and bruising on the chest and neck area.  Ice packs will help.  Swelling and bruising can take several days to resolve °Most patients will experience some swelling and bruising in the area of the incision. Ice pack will help. Swelling and bruising can take several days to resolve..  °It is common to experience some constipation if taking pain medication after surgery.  Increasing fluid intake and taking a stool softener will usually help or prevent this problem from occurring.  A mild laxative (Milk of Magnesia or Miralax) should be taken according to package directions if there are no bowel movements after 48 hours. ° You may have steri-strips (small skin tapes) in place directly over the incision.  These strips should be left on the skin for 7-10 days.  If your surgeon used skin  glue on the incision, you may shower in 24 hours.  The glue will flake off over the next 2-3 weeks.  Any sutures or staples will be removed at the office during your follow-up visit. You may find that a light gauze bandage over your incision may keep your staples from being rubbed or pulled. You may shower and replace the bandage daily. °ACTIVITIES:  You may resume regular (light) daily activities beginning the next day--such as daily self-care, walking, climbing stairs--gradually increasing activities as tolerated.  You may have sexual intercourse when it is comfortable.  Refrain from any heavy lifting or straining until approved by your doctor. °You may drive when you no longer are taking prescription pain medication, you can comfortably wear a seatbelt, and you can safely maneuver your car and apply brakes ° °You should see your doctor in the office for a follow-up appointment approximately two weeks after your surgery.  Make sure that you call for this appointment within a day or two after you arrive home to insure a convenient appointment time. ° °WHEN TO CALL YOUR DOCTOR: °Fever over 101.0 °Inability to urinate °Nausea and/or vomiting °Extreme swelling or bruising °Continued bleeding from incision. °Increased pain, redness, or drainage from the incision. °Difficulty swallowing or breathing °Muscle cramping or spasms. °Numbness or tingling in hands or feet or around lips. ° °The clinic staff is available to answer your questions during regular business hours.  Please don’t hesitate to call and ask to speak to one of the nurses if you have concerns. ° °For   For further questions, please visit [email or web address]   OK to shower and allow soap and warm water to rinse over incision and drain. Dry abdomen well after you get out of the shower and apply split gauze to drain site and dry gauze dressing to open portion of midline incision. Change dressing to midline twice daily.

## 2021-04-15 NOTE — Progress Notes (Signed)
Patient and spouse were given discharge instructions, and all questions were answered. Instructions were also given for intracatheter flushes and demonstrated by patient's spouse. Patient was stable for discharge and was taken to the main exit by wheelchair.

## 2021-04-16 ENCOUNTER — Inpatient Hospital Stay (HOSPITAL_COMMUNITY): Payer: Medicare Other

## 2021-04-16 DIAGNOSIS — J90 Pleural effusion, not elsewhere classified: Secondary | ICD-10-CM | POA: Diagnosis not present

## 2021-04-16 DIAGNOSIS — E860 Dehydration: Secondary | ICD-10-CM | POA: Diagnosis present

## 2021-04-16 DIAGNOSIS — Z8614 Personal history of Methicillin resistant Staphylococcus aureus infection: Secondary | ICD-10-CM | POA: Diagnosis not present

## 2021-04-16 DIAGNOSIS — D72829 Elevated white blood cell count, unspecified: Secondary | ICD-10-CM | POA: Diagnosis not present

## 2021-04-16 DIAGNOSIS — Z888 Allergy status to other drugs, medicaments and biological substances status: Secondary | ICD-10-CM | POA: Diagnosis not present

## 2021-04-16 DIAGNOSIS — E871 Hypo-osmolality and hyponatremia: Secondary | ICD-10-CM | POA: Diagnosis not present

## 2021-04-16 DIAGNOSIS — Z79899 Other long term (current) drug therapy: Secondary | ICD-10-CM | POA: Diagnosis not present

## 2021-04-16 DIAGNOSIS — R410 Disorientation, unspecified: Secondary | ICD-10-CM | POA: Diagnosis not present

## 2021-04-16 DIAGNOSIS — R739 Hyperglycemia, unspecified: Secondary | ICD-10-CM | POA: Diagnosis not present

## 2021-04-16 DIAGNOSIS — N179 Acute kidney failure, unspecified: Secondary | ICD-10-CM | POA: Diagnosis present

## 2021-04-16 DIAGNOSIS — D735 Infarction of spleen: Secondary | ICD-10-CM | POA: Diagnosis present

## 2021-04-16 DIAGNOSIS — I959 Hypotension, unspecified: Secondary | ICD-10-CM | POA: Diagnosis present

## 2021-04-16 DIAGNOSIS — Z6831 Body mass index (BMI) 31.0-31.9, adult: Secondary | ICD-10-CM | POA: Diagnosis not present

## 2021-04-16 DIAGNOSIS — D75838 Other thrombocytosis: Secondary | ICD-10-CM | POA: Diagnosis not present

## 2021-04-16 DIAGNOSIS — K567 Ileus, unspecified: Secondary | ICD-10-CM | POA: Diagnosis not present

## 2021-04-16 DIAGNOSIS — J189 Pneumonia, unspecified organism: Secondary | ICD-10-CM | POA: Diagnosis not present

## 2021-04-16 DIAGNOSIS — K651 Peritoneal abscess: Secondary | ICD-10-CM | POA: Diagnosis present

## 2021-04-16 DIAGNOSIS — D638 Anemia in other chronic diseases classified elsewhere: Secondary | ICD-10-CM | POA: Diagnosis present

## 2021-04-16 DIAGNOSIS — Z781 Physical restraint status: Secondary | ICD-10-CM | POA: Diagnosis not present

## 2021-04-16 DIAGNOSIS — J9811 Atelectasis: Secondary | ICD-10-CM | POA: Diagnosis not present

## 2021-04-16 DIAGNOSIS — Z20822 Contact with and (suspected) exposure to covid-19: Secondary | ICD-10-CM | POA: Diagnosis present

## 2021-04-16 DIAGNOSIS — D62 Acute posthemorrhagic anemia: Secondary | ICD-10-CM | POA: Diagnosis not present

## 2021-04-16 DIAGNOSIS — E669 Obesity, unspecified: Secondary | ICD-10-CM | POA: Diagnosis present

## 2021-04-16 DIAGNOSIS — E440 Moderate protein-calorie malnutrition: Secondary | ICD-10-CM | POA: Diagnosis present

## 2021-04-16 HISTORY — PX: IR ANGIOGRAM SELECTIVE EACH ADDITIONAL VESSEL: IMG667

## 2021-04-16 HISTORY — PX: IR EMBO ART  VEN HEMORR LYMPH EXTRAV  INC GUIDE ROADMAPPING: IMG5450

## 2021-04-16 HISTORY — PX: IR US GUIDE VASC ACCESS RIGHT: IMG2390

## 2021-04-16 HISTORY — PX: IR ANGIOGRAM VISCERAL SELECTIVE: IMG657

## 2021-04-16 LAB — CBC WITH DIFFERENTIAL/PLATELET
Band Neutrophils: 1 %
Basophils Relative: 0 %
Blasts: NONE SEEN %
Eosinophils Relative: 0 %
HCT: 35.8 % — ABNORMAL LOW (ref 39.0–52.0)
Hemoglobin: 10.9 g/dL — ABNORMAL LOW (ref 13.0–17.0)
Lymphocytes Relative: 3 %
MCH: 26.8 pg (ref 26.0–34.0)
MCHC: 30.4 g/dL (ref 30.0–36.0)
MCV: 88 fL (ref 80.0–100.0)
Metamyelocytes Relative: NONE SEEN %
Monocytes Relative: 4 %
Myelocytes: 1 %
Neutrophils Relative %: 91 %
Platelets: 785 10*3/uL — ABNORMAL HIGH (ref 150–400)
Promyelocytes Relative: NONE SEEN %
RBC Morphology: NORMAL
RBC: 4.07 MIL/uL — ABNORMAL LOW (ref 4.22–5.81)
RDW: 15.4 % (ref 11.5–15.5)
WBC Morphology: NORMAL
WBC: 31.2 10*3/uL — ABNORMAL HIGH (ref 4.0–10.5)
nRBC: 0 % (ref 0.0–0.2)
nRBC: NONE SEEN /100 WBC

## 2021-04-16 LAB — COMPREHENSIVE METABOLIC PANEL
ALT: 26 U/L (ref 0–44)
AST: 25 U/L (ref 15–41)
Albumin: 3.2 g/dL — ABNORMAL LOW (ref 3.5–5.0)
Alkaline Phosphatase: 78 U/L (ref 38–126)
Anion gap: 12 (ref 5–15)
BUN: 19 mg/dL (ref 8–23)
CO2: 21 mmol/L — ABNORMAL LOW (ref 22–32)
Calcium: 8.8 mg/dL — ABNORMAL LOW (ref 8.9–10.3)
Chloride: 102 mmol/L (ref 98–111)
Creatinine, Ser: 1.71 mg/dL — ABNORMAL HIGH (ref 0.61–1.24)
GFR, Estimated: 43 mL/min — ABNORMAL LOW (ref >60)
Glucose, Bld: 189 mg/dL — ABNORMAL HIGH (ref 70–99)
Potassium: 3.9 mmol/L (ref 3.5–5.1)
Sodium: 135 mmol/L (ref 135–145)
Total Bilirubin: 0.4 mg/dL (ref 0.3–1.2)
Total Protein: 7.7 g/dL (ref 6.5–8.1)

## 2021-04-16 LAB — GLUCOSE, CAPILLARY
Glucose-Capillary: 107 mg/dL — ABNORMAL HIGH (ref 70–99)
Glucose-Capillary: 115 mg/dL — ABNORMAL HIGH (ref 70–99)
Glucose-Capillary: 129 mg/dL — ABNORMAL HIGH (ref 70–99)
Glucose-Capillary: 134 mg/dL — ABNORMAL HIGH (ref 70–99)
Glucose-Capillary: 140 mg/dL — ABNORMAL HIGH (ref 70–99)

## 2021-04-16 LAB — BASIC METABOLIC PANEL
Anion gap: 9 (ref 5–15)
BUN: 21 mg/dL (ref 8–23)
CO2: 22 mmol/L (ref 22–32)
Calcium: 8 mg/dL — ABNORMAL LOW (ref 8.9–10.3)
Chloride: 105 mmol/L (ref 98–111)
Creatinine, Ser: 1.23 mg/dL (ref 0.61–1.24)
GFR, Estimated: >60 mL/min (ref >60)
Glucose, Bld: 169 mg/dL — ABNORMAL HIGH (ref 70–99)
Potassium: 5.1 mmol/L (ref 3.5–5.1)
Sodium: 136 mmol/L (ref 135–145)

## 2021-04-16 LAB — CBC
HCT: 36.4 % — ABNORMAL LOW (ref 39.0–52.0)
Hemoglobin: 10.9 g/dL — ABNORMAL LOW (ref 13.0–17.0)
MCH: 27 pg (ref 26.0–34.0)
MCHC: 29.9 g/dL — ABNORMAL LOW (ref 30.0–36.0)
MCV: 90.1 fL (ref 80.0–100.0)
Platelets: 807 10*3/uL — ABNORMAL HIGH (ref 150–400)
RBC: 4.04 MIL/uL — ABNORMAL LOW (ref 4.22–5.81)
RDW: 15.4 % (ref 11.5–15.5)
WBC: 31.2 10*3/uL — ABNORMAL HIGH (ref 4.0–10.5)

## 2021-04-16 LAB — RESP PANEL BY RT-PCR (FLU A&B, COVID) ARPGX2
Influenza A by PCR: NEGATIVE
Influenza B by PCR: NEGATIVE
SARS Coronavirus 2 by RT PCR: NEGATIVE

## 2021-04-16 LAB — TROPONIN I (HIGH SENSITIVITY)
Troponin I (High Sensitivity): 3 ng/L (ref <18)
Troponin I (High Sensitivity): 3 ng/L (ref <18)

## 2021-04-16 LAB — LACTIC ACID, PLASMA
Lactic Acid, Venous: 2.8 mmol/L (ref 0.5–1.9)
Lactic Acid, Venous: 5 mmol/L (ref 0.5–1.9)

## 2021-04-16 LAB — LIPASE, BLOOD: Lipase: 41 U/L (ref 11–51)

## 2021-04-16 LAB — MRSA NEXT GEN BY PCR, NASAL: MRSA by PCR Next Gen: NOT DETECTED

## 2021-04-16 LAB — HEMOGLOBIN AND HEMATOCRIT, BLOOD
HCT: 29.5 % — ABNORMAL LOW (ref 39.0–52.0)
Hemoglobin: 9.4 g/dL — ABNORMAL LOW (ref 13.0–17.0)

## 2021-04-16 LAB — PREPARE RBC (CROSSMATCH)

## 2021-04-16 MED ORDER — ONDANSETRON HCL 4 MG/2ML IJ SOLN
4.0000 mg | Freq: Once | INTRAMUSCULAR | Status: AC
Start: 2021-04-16 — End: 2021-04-16

## 2021-04-16 MED ORDER — METRONIDAZOLE 500 MG/100ML IV SOLN
500.0000 mg | Freq: Four times a day (QID) | INTRAVENOUS | Status: DC
  Administered 2021-04-16 (×2): 500 mg via INTRAVENOUS
  Filled 2021-04-16 (×2): qty 100

## 2021-04-16 MED ORDER — CALCIUM POLYCARBOPHIL 625 MG PO TABS
625.0000 mg | ORAL_TABLET | Freq: Two times a day (BID) | ORAL | Status: DC
  Administered 2021-04-16 – 2021-04-30 (×25): 625 mg via ORAL
  Filled 2021-04-16 (×31): qty 1

## 2021-04-16 MED ORDER — BISACODYL 10 MG RE SUPP: 10.0000 mg | Freq: Two times a day (BID) | RECTAL | Status: DC | PRN

## 2021-04-16 MED ORDER — LACTATED RINGERS IV BOLUS: 1000.0000 mL | Freq: Three times a day (TID) | INTRAVENOUS | Status: DC | PRN

## 2021-04-16 MED ORDER — ACETAMINOPHEN 500 MG PO TABS
1000.0000 mg | ORAL_TABLET | Freq: Four times a day (QID) | ORAL | Status: DC
  Administered 2021-04-16 – 2021-04-25 (×34): 1000 mg via ORAL
  Filled 2021-04-16 (×34): qty 2

## 2021-04-16 MED ORDER — PIPERACILLIN-TAZOBACTAM 3.375 G IVPB: 3.3750 g | Freq: Three times a day (TID) | INTRAVENOUS | Status: DC

## 2021-04-16 MED ORDER — LIDOCAINE HCL 1 % IJ SOLN
INTRAMUSCULAR | Status: AC
  Filled 2021-04-16: qty 20

## 2021-04-16 MED ORDER — PHENOL 1.4 % MT LIQD: 2.0000 | OROMUCOSAL | Status: DC | PRN

## 2021-04-16 MED ORDER — ENOXAPARIN SODIUM 40 MG/0.4ML IJ SOSY: 40.0000 mg | PREFILLED_SYRINGE | INTRAMUSCULAR | Status: DC

## 2021-04-16 MED ORDER — PIPERACILLIN-TAZOBACTAM 3.375 G IVPB
3.3750 g | Freq: Three times a day (TID) | INTRAVENOUS | Status: DC
  Administered 2021-04-16 – 2021-04-18 (×5): 3.375 g via INTRAVENOUS
  Filled 2021-04-16 (×6): qty 50

## 2021-04-16 MED ORDER — ALUM & MAG HYDROXIDE-SIMETH 200-200-20 MG/5ML PO SUSP: 30.0000 mL | Freq: Four times a day (QID) | ORAL | Status: DC | PRN

## 2021-04-16 MED ORDER — ONDANSETRON HCL 4 MG/2ML IJ SOLN
INTRAMUSCULAR | Status: AC
  Administered 2021-04-16: 4 mg via INTRAVENOUS
  Filled 2021-04-16: qty 2

## 2021-04-16 MED ORDER — OXYCODONE HCL 5 MG PO TABS
5.0000 mg | ORAL_TABLET | ORAL | Status: DC | PRN
  Administered 2021-04-16: 10 mg via ORAL
  Administered 2021-04-16: 5 mg via ORAL
  Administered 2021-04-19 – 2021-04-21 (×9): 10 mg via ORAL
  Administered 2021-04-22: 5 mg via ORAL
  Administered 2021-04-22 – 2021-04-23 (×6): 10 mg via ORAL
  Administered 2021-04-24: 5 mg via ORAL
  Administered 2021-04-24 – 2021-04-25 (×6): 10 mg via ORAL
  Filled 2021-04-16 (×5): qty 2
  Filled 2021-04-16: qty 1
  Filled 2021-04-16 (×17): qty 2
  Filled 2021-04-16 (×2): qty 1
  Filled 2021-04-16 (×4): qty 2

## 2021-04-16 MED ORDER — HYDROMORPHONE HCL 1 MG/ML IJ SOLN
0.5000 mg | INTRAMUSCULAR | Status: DC | PRN
  Administered 2021-04-16 (×3): 0.5 mg via INTRAVENOUS
  Filled 2021-04-16 (×3): qty 1

## 2021-04-16 MED ORDER — MENTHOL 3 MG MT LOZG
1.0000 | LOZENGE | OROMUCOSAL | Status: DC | PRN
  Administered 2021-04-22: 3 mg via ORAL
  Filled 2021-04-16: qty 9

## 2021-04-16 MED ORDER — MIDAZOLAM HCL 2 MG/2ML IJ SOLN
INTRAMUSCULAR | Status: AC
  Filled 2021-04-16: qty 2

## 2021-04-16 MED ORDER — ONDANSETRON 4 MG PO TBDP: 4.0000 mg | ORAL_TABLET | Freq: Four times a day (QID) | ORAL | Status: DC | PRN

## 2021-04-16 MED ORDER — LACTATED RINGERS IV SOLN: INTRAVENOUS | Status: DC

## 2021-04-16 MED ORDER — LACTATED RINGERS IV BOLUS
1000.0000 mL | Freq: Once | INTRAVENOUS | Status: AC
  Administered 2021-04-16: 1000 mL via INTRAVENOUS

## 2021-04-16 MED ORDER — SODIUM CHLORIDE 0.9 % IV SOLN
500.0000 mg | Freq: Once | INTRAVENOUS | Status: AC
  Administered 2021-04-16: 500 mg via INTRAVENOUS
  Filled 2021-04-16: qty 10

## 2021-04-16 MED ORDER — ACETAMINOPHEN 500 MG PO TABS: 1000.0000 mg | ORAL_TABLET | Freq: Four times a day (QID) | ORAL | Status: DC | PRN

## 2021-04-16 MED ORDER — LIDOCAINE HCL 1 % IJ SOLN
INTRAMUSCULAR | Status: AC | PRN
  Administered 2021-04-16: 5 mL via INTRADERMAL

## 2021-04-16 MED ORDER — PIPERACILLIN-TAZOBACTAM 3.375 G IVPB
3.3750 g | Freq: Once | INTRAVENOUS | Status: AC
  Administered 2021-04-16: 3.375 g via INTRAVENOUS
  Filled 2021-04-16: qty 50

## 2021-04-16 MED ORDER — INSULIN ASPART 100 UNIT/ML IJ SOLN
0.0000 [IU] | INTRAMUSCULAR | Status: DC
  Administered 2021-04-16 – 2021-04-21 (×10): 1 [IU] via SUBCUTANEOUS
  Administered 2021-04-21: 3 [IU] via SUBCUTANEOUS
  Administered 2021-04-22 (×4): 1 [IU] via SUBCUTANEOUS
  Administered 2021-04-22: 2 [IU] via SUBCUTANEOUS
  Administered 2021-04-22 – 2021-04-25 (×11): 1 [IU] via SUBCUTANEOUS
  Filled 2021-04-16: qty 0.09

## 2021-04-16 MED ORDER — MAGIC MOUTHWASH: 15.0000 mL | Freq: Four times a day (QID) | ORAL | Status: DC | PRN

## 2021-04-16 MED ORDER — SODIUM CHLORIDE 0.9 % IV SOLN
2.0000 g | INTRAVENOUS | Status: DC
  Administered 2021-04-16: 2 g via INTRAVENOUS
  Filled 2021-04-16: qty 20

## 2021-04-16 MED ORDER — SODIUM CHLORIDE 0.9% FLUSH
3.0000 mL | Freq: Two times a day (BID) | INTRAVENOUS | Status: DC
  Administered 2021-04-16 – 2021-04-18 (×6): 3 mL via INTRAVENOUS

## 2021-04-16 MED ORDER — FENTANYL CITRATE (PF) 100 MCG/2ML IJ SOLN
INTRAMUSCULAR | Status: AC
  Filled 2021-04-16: qty 2

## 2021-04-16 MED ORDER — PROCHLORPERAZINE EDISYLATE 10 MG/2ML IJ SOLN
5.0000 mg | INTRAMUSCULAR | Status: DC | PRN
Start: 2021-04-16 — End: 2021-05-01
  Administered 2021-04-16 – 2021-04-17 (×2): 10 mg via INTRAVENOUS
  Filled 2021-04-16 (×2): qty 2

## 2021-04-16 MED ORDER — ENOXAPARIN SODIUM 40 MG/0.4ML IJ SOSY
40.0000 mg | PREFILLED_SYRINGE | INTRAMUSCULAR | Status: DC
  Filled 2021-04-16: qty 0.4

## 2021-04-16 MED ORDER — IOHEXOL 300 MG/ML  SOLN
100.0000 mL | Freq: Once | INTRAMUSCULAR | Status: AC | PRN
  Administered 2021-04-16: 12 mL via INTRA_ARTERIAL

## 2021-04-16 MED ORDER — CHLORHEXIDINE GLUCONATE CLOTH 2 % EX PADS
6.0000 | MEDICATED_PAD | Freq: Every day | CUTANEOUS | Status: DC
  Administered 2021-04-16 – 2021-04-23 (×8): 6 via TOPICAL

## 2021-04-16 MED ORDER — FENTANYL CITRATE (PF) 100 MCG/2ML IJ SOLN
100.0000 ug | Freq: Once | INTRAMUSCULAR | Status: AC
Start: 2021-04-16 — End: 2021-04-16
  Administered 2021-04-16: 100 ug via INTRAVENOUS

## 2021-04-16 MED ORDER — SODIUM CHLORIDE 0.9% FLUSH
3.0000 mL | INTRAVENOUS | Status: DC | PRN
Start: 2021-04-16 — End: 2021-04-22

## 2021-04-16 MED ORDER — DIPHENHYDRAMINE HCL 50 MG/ML IJ SOLN: 12.5000 mg | Freq: Four times a day (QID) | INTRAMUSCULAR | Status: DC | PRN

## 2021-04-16 MED ORDER — IOHEXOL 300 MG/ML  SOLN
100.0000 mL | Freq: Once | INTRAMUSCULAR | Status: AC | PRN
  Administered 2021-04-16: 45 mL via INTRA_ARTERIAL

## 2021-04-16 MED ORDER — ONDANSETRON HCL 4 MG/2ML IJ SOLN: 4.0000 mg | Freq: Four times a day (QID) | INTRAMUSCULAR | Status: DC | PRN

## 2021-04-16 MED ORDER — SODIUM CHLORIDE 0.9 % IV SOLN
25.0000 mg | Freq: Once | INTRAVENOUS | Status: AC
  Administered 2021-04-16: 25 mg via INTRAVENOUS
  Filled 2021-04-16: qty 0.5

## 2021-04-16 MED ORDER — HYDROMORPHONE HCL 1 MG/ML IJ SOLN
0.5000 mg | INTRAMUSCULAR | Status: DC | PRN
Start: 2021-04-16 — End: 2021-04-26
  Administered 2021-04-16: 1 mg via INTRAVENOUS
  Administered 2021-04-17 – 2021-04-20 (×8): 2 mg via INTRAVENOUS
  Administered 2021-04-22 – 2021-04-24 (×5): 1 mg via INTRAVENOUS
  Administered 2021-04-24 – 2021-04-25 (×5): 2 mg via INTRAVENOUS
  Administered 2021-04-25: 1 mg via INTRAVENOUS
  Administered 2021-04-25 – 2021-04-26 (×5): 2 mg via INTRAVENOUS
  Administered 2021-04-26: 1 mg via INTRAVENOUS
  Filled 2021-04-16 (×3): qty 2
  Filled 2021-04-16: qty 1
  Filled 2021-04-16: qty 2
  Filled 2021-04-16: qty 1
  Filled 2021-04-16 (×3): qty 2
  Filled 2021-04-16 (×2): qty 1
  Filled 2021-04-16: qty 2
  Filled 2021-04-16 (×2): qty 1
  Filled 2021-04-16 (×5): qty 2
  Filled 2021-04-16: qty 1
  Filled 2021-04-16: qty 2
  Filled 2021-04-16: qty 1
  Filled 2021-04-16: qty 2
  Filled 2021-04-16: qty 1
  Filled 2021-04-16 (×4): qty 2

## 2021-04-16 MED ORDER — SIMETHICONE 40 MG/0.6ML PO SUSP
80.0000 mg | Freq: Four times a day (QID) | ORAL | Status: DC | PRN
  Administered 2021-04-26 – 2021-04-28 (×5): 80 mg via ORAL
  Filled 2021-04-16 (×9): qty 1.2

## 2021-04-16 MED ORDER — FENTANYL CITRATE (PF) 100 MCG/2ML IJ SOLN
INTRAMUSCULAR | Status: AC | PRN
  Administered 2021-04-16: 25 ug via INTRAVENOUS
  Administered 2021-04-16: 50 ug via INTRAVENOUS
  Administered 2021-04-16: 25 ug via INTRAVENOUS

## 2021-04-16 MED ORDER — LIP MEDEX EX OINT
1.0000 "application " | TOPICAL_OINTMENT | Freq: Two times a day (BID) | CUTANEOUS | Status: DC
  Administered 2021-04-16 – 2021-05-01 (×28): 1 via TOPICAL
  Filled 2021-04-16 (×2): qty 7

## 2021-04-16 MED ORDER — GELATIN ABSORBABLE 12-7 MM EX MISC
CUTANEOUS | Status: AC
  Filled 2021-04-16: qty 2

## 2021-04-16 MED ORDER — SODIUM CHLORIDE 0.9 % IV SOLN: 250.0000 mL | INTRAVENOUS | Status: DC | PRN

## 2021-04-16 MED ORDER — ONDANSETRON HCL 4 MG/2ML IJ SOLN
4.0000 mg | Freq: Four times a day (QID) | INTRAMUSCULAR | Status: DC | PRN
  Administered 2021-04-16 – 2021-04-19 (×4): 4 mg via INTRAVENOUS
  Filled 2021-04-16 (×5): qty 2

## 2021-04-16 MED ORDER — SODIUM CHLORIDE 0.9 % IV BOLUS
2000.0000 mL | Freq: Once | INTRAVENOUS | Status: AC
  Administered 2021-04-16: 2000 mL via INTRAVENOUS

## 2021-04-16 MED ORDER — PANTOPRAZOLE SODIUM 40 MG PO TBEC
40.0000 mg | DELAYED_RELEASE_TABLET | Freq: Two times a day (BID) | ORAL | Status: DC
  Administered 2021-04-17 – 2021-05-01 (×28): 40 mg via ORAL
  Filled 2021-04-16 (×30): qty 1

## 2021-04-16 MED ORDER — SODIUM CHLORIDE 0.9% IV SOLUTION: Freq: Once | INTRAVENOUS | Status: AC

## 2021-04-16 MED ORDER — SODIUM CHLORIDE 0.9 % IV SOLN
8.0000 mg | Freq: Four times a day (QID) | INTRAVENOUS | Status: DC | PRN
  Filled 2021-04-16: qty 4

## 2021-04-16 NOTE — Progress Notes (Signed)
Patient transferred to IR via gurney with RN and CNA. Vital signs stable on transport, patient was transported with an extra unit of blood that was retrieved from the lab by the surgeon. Blood transfusion currently running during transport after consent obtained from patient. 15 min post vital signs were within normal limits.

## 2021-04-16 NOTE — ED Notes (Signed)
Fluids given upon arrival by verbal order

## 2021-04-16 NOTE — ED Provider Notes (Signed)
[facility]-EMERGENCY DEPT Provider Note   CSN: [id number] Arrival date & time: 04/15/21  2324     History Chief Complaint  Patient presents with   Hypotension   Weakness    Brian Atkinson is a 70 y.o. male.  Patient is a 70 year old male with past medical history of recent admission for ventral hernia repair complicated with rectovesicular abscess requiring percutaneous drain placement.  Patient was discharged earlier today.  Shortly after returning home, patient began to feel extremely weak, short of breath, generalized abdominal discomfort, and reports "passing out" several times.  His wife spoke with general surgery and patient was advised to come to the ER to be evaluated.  He does report having a bowel movement earlier today that was normal.  He denies blood or melena.  He denies fevers or chills.  The history is provided by the patient.  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   Recurrent incisional hernia 03/28/2021   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   Acute blood loss anemia 07/28/2020   Closed bicondylar fracture of left tibial plateau 07/23/2020   History of MRSA infection left patella  09/26/2017   MRSA (methicillin resistant Staphylococcus aureus)    Unspecified constipation 06/20/2014   Iron deficiency anemia 06/18/2014    Past Surgical History:  Procedure  Laterality Date   BONE EXCISION Left 09/25/2017   Procedure: PARTIAL EXCISION OF LEFT PATELLA;  Surgeon: Myrene Galas, MD;  [facility]: [facility] OR;  Service: Orthopedics;  Laterality: Left;   CATARACT EXTRACTION W/ INTRAOCULAR LENS  IMPLANT, BILATERAL     COLONOSCOPY     DEBRIDEMENT AND CLOSURE WOUND Left 06/19/2014   Procedure: DEBRIDEMENT AND CLOSURE LEFT KNEE;  Surgeon: Kathryne Hitch, MD;  Location: [facility] ORS;  Service: Orthopedics;  Laterality: Left;   EXTERNAL FIXATION LEG Left 07/23/2020   Procedure: EXTERNAL FIXATION LEG;  Surgeon: Yolonda Kida, MD;  Location: [facility] OR;  Service: Orthopedics;  Laterality: Left;   EXTERNAL FIXATION LEG Left 07/27/2020   Procedure: Adjustment of external fixator left leg;  Surgeon: Myrene Galas, MD;  Location: [facility] OR;  Service: Orthopedics;  Laterality: Left;   EXTERNAL FIXATION REMOVAL Left 08/12/2020   Procedure: REMOVAL EXTERNAL FIXATION LEG;  Surgeon: Myrene Galas, MD;  Location: [facility] OR;  Service: Orthopedics;  Laterality: Left;   FOREIGN BODY REMOVAL N/A 03/28/2021   Procedure: OPEN EXCISION OF INTRABDOMINAL MESH;  Surgeon: Quentin Ore, MD;  Location: [facility] ORS;  Service: General;  Laterality: N/A;   IRRIGATION AND DEBRIDEMENT KNEE Left 06/17/2014   Procedure: IRRIGATION AND DEBRIDEMENT KNEE;  Surgeon: Budd Palmer, MD;  Location: [facility]S;  Service: Orthopedics;  Laterality: Left;   KNEE BURSECTOMY Left 05/27/2015   KNEE BURSECTOMY Left 05/27/2015   Procedure: KNEE BURSECTOMY, suture removal patellar tendon;  Surgeon: Myrene Galas, MD;  Location: [facility] OR;  Service: Orthopedics;  Laterality: Left;   LAPAROSCOPY N/A 04/04/2021   Procedure: Diagnostic laparotomy, exploratory laparotomy, small bowel resection with mesh;  Surgeon: Quentin OreStechschulte, Paul J, MD;  Location: [facility]S;  Service: General;  Laterality: N/A;   ORIF PATELLA Left 06/12/2014   Procedure: OPEN REDUCTION INTERNAL (ORIF) FIXATION PATELLA;  Surgeon: Budd PalmerMichael H Handy, MD;  [facility]  OR;  Service: Orthopedics;  Laterality: Left;   ORIF TIBIA PLATEAU Left 08/12/2020   ORIF TIBIA PLATEAU Left 08/12/2020   Procedure: OPEN REDUCTION INTERNAL FIXATION (ORIF) BICONDYLAR TIBIAL PLATEAU;  Surgeon: Myrene GalasHandy, Michael, MD;  Location: [facility] OR;  Service: Orthopedics;  Laterality: Left;   ORIF TIBIA PLATEAU Left 12/14/2020   Procedure: TIBIA REPAIR USING ILLIAC CREST BONE GRAFT VERSUS REAMED INTRAMEDULLARY ASPIRATE;  Surgeon: Myrene GalasHandy, Michael, MD;  [facility];  Service: Orthopedics;  Laterality: Left;   REFRACTIVE SURGERY Bilateral    TONSILLECTOMY     VENTRAL HERNIA REPAIR N/A 03/28/2021   Procedure: REPAIR OF RECURRENT VENTRAL HERNIA WITH MESH;  Surgeon: Stechschulte, Hyman HopesPaul J, MD;  Location: [facility]S;  Service: General;  Laterality: N/A;       No family history on file.  Social History   Tobacco Use   Smoking status: Never   Smokeless tobacco: Never  Vaping Use   Vaping Use: Never used  Substance Use Topics   Alcohol use: Not Currently   Drug use: No    Home Medications Prior to Admission medications   Medication Sig Start Date End Date Taking? Authorizing Provider  acetaminophen (TYLENOL) 500 MG tablet Take 2 tablets (1,000 mg total) by mouth every 6 (six) hours as needed for mild pain or fever. 04/15/21   Juliet RudeJohnson, Kelly R, PA-C  amoxicillin-clavulanate (AUGMENTIN) 875-125 MG tablet Take 1 tablet by mouth every 12 (twelve) hours for 7 days. 04/15/21 04/22/21  Juliet RudeJohnson, Kelly R, PA-C  cetirizine (ZYRTEC) 10 MG tablet Take 10 mg by mouth daily as needed for allergies.    [provider]  docusate sodium (COLACE) 100 MG capsule Take 1 capsule (100 mg total) by mouth 2 (two) times daily. 04/15/21   Juliet RudeJohnson, Kelly R, PA-C  gabapentin (NEURONTIN) 300 MG capsule Take 1 capsule (300 mg total) by mouth 3 (three) times daily. 04/15/21   Juliet RudeJohnson, Kelly R, PA-C  Multiple Vitamins-Minerals (ONE-A-DAY MENS 50+) TABS Take 1 tablet by mouth daily.    [provider]  oxyCODONE (OXY  IR/ROXICODONE) 5 MG immediate release tablet Take 1 tablet (5 mg total) by mouth every 4 (four) hours as needed for moderate pain. 04/15/21   Juliet RudeJohnson, Kelly R, PA-C  pantoprazole (PROTONIX) 40 MG tablet Take 1 tablet (40 mg total) by mouth 2 (two) times daily. 04/15/21   Juliet RudeJohnson, Kelly R, PA-C  polyethylene glycol (MIRALAX / GLYCOLAX) 17 g packet Take 17 g by mouth daily. 04/15/21   Juliet RudeJohnson, Kelly R, PA-C  sodium chloride flush (NS) 0.9 % SOLN 5 mLs by Intracatheter route daily. 04/15/21   Juliet RudeJohnson, Kelly R, PA-C  enoxaparin (LOVENOX) 40 MG/0.4ML injection Inject 0.4 mLs (40 mg total) into the skin daily. 07/28/20 08/16/20  Montez MoritaPaul, Keith, PA-C  ferrous sulfate 325 (65 FE) MG tablet Take 1 tablet (325 mg total) by mouth 3 (three) times daily with meals. Patient not taking: Reported on 12/09/2020 07/28/20 12/17/20  Montez MoritaPaul, Keith, PA-C  rivaroxaban (XARELTO) 10 MG TABS tablet TAKE 1 TABLET (10 MG TOTAL) BY MOUTH DAILY. Patient not taking: No sig reported 12/17/20 04/15/21  Montez MoritaPaul, Keith, PA-C    Allergies  Versed [midazolam]  Review of Systems   Review of Systems  Neurological:  Positive for weakness.  All other systems reviewed and are negative.  Physical Exam Updated Vital Signs BP 92/63   Pulse (!) 109   Temp 97.8 F (36.6 C) (Oral)   Resp 20   SpO2 98%   Physical Exam Vitals and nursing note reviewed.  Constitutional:      General: He is not in acute distress.    Appearance: He is well-developed. He is ill-appearing. He is not diaphoretic.     Comments: Patient is a 69 year old male.  He is pale appearing.  HENT:     Head: Normocephalic and atraumatic.  Cardiovascular:     Rate and Rhythm: Normal rate and regular rhythm.     Heart sounds: No murmur heard.   No friction rub.  Pulmonary:     Effort: Pulmonary effort is normal. No respiratory distress.     Breath sounds: Normal breath sounds. No wheezing or rales.  Abdominal:     General: Bowel sounds are normal. There is no distension.      Palpations: Abdomen is soft.     Tenderness: There is abdominal tenderness.     Comments: There is generalized abdominal tenderness, but most notable in the left lower quadrant.  The surgical incision to the epigastric region appears well.  Staples are in place with no erythema or purulent drainage.  The percutaneous drain site also appears well with no surrounding erythema or purulent drainage.  Musculoskeletal:        General: Normal range of motion.     Cervical back: Normal range of motion and neck supple.  Skin:    General: Skin is warm and dry.  Neurological:     Mental Status: He is alert and oriented to person, place, and time.     Coordination: Coordination normal.    ED Results / Procedures / Treatments   Labs (all labs ordered are listed, but only abnormal results are displayed) Labs Reviewed  CULTURE, BLOOD (ROUTINE X 2)  CULTURE, BLOOD (ROUTINE X 2)  CBC WITH DIFFERENTIAL/PLATELET  COMPREHENSIVE METABOLIC PANEL  LIPASE, BLOOD  LACTIC ACID, PLASMA  LACTIC ACID, PLASMA  URINALYSIS, ROUTINE W REFLEX MICROSCOPIC  TYPE AND SCREEN  TROPONIN I (HIGH SENSITIVITY)    EKG None  Radiology No results found.  Procedures Procedures   Medications Ordered in ED Medications - No data to display  ED Course  I have reviewed the triage vital signs and the nursing notes.  Pertinent labs & imaging results that were available during my care of the patient were reviewed by me and considered in my medical decision making (see chart for details).    MDM Rules/Calculators/A&P  Patient with history of recent abdominal surgery as described in the HPI.  He was just discharged earlier today, then developed increasing abdominal pain, weakness, and syncopal episodes at home.  IV fluids initiated and laboratory studies obtained.  Patient then sent for a CT scan of the abdomen and pelvis showing what appears to be spontaneous splenic rupture with moderate volume hemoperitoneum.  Care has  been discussed with Dr. Freida Busman from general surgery.  She has evaluated the patient in the ER and will admit to her service.  CRITICAL CARE Performed by: Geoffery Lyons Total critical care time: 40 minutes Critical care time was exclusive of separately billable procedures and treating other patients. Critical care was necessary to treat or prevent imminent or life-threatening deterioration. Critical care was time  spent personally by me on the following activities: development of treatment plan with patient and/or surrogate as well as nursing, discussions with consultants, evaluation of patient's response to treatment, examination of patient, obtaining history from patient or surrogate, ordering and performing treatments and interventions, ordering and review of laboratory studies, ordering and review of radiographic studies, pulse oximetry and re-evaluation of patient's condition.   Final Clinical Impression(s) / ED Diagnoses Final diagnoses:  None    Rx / DC Orders ED Discharge Orders     None        Geoffery Lyons, MD 04/16/21 [phone]

## 2021-04-16 NOTE — Progress Notes (Signed)
CT scan reviewed and shows splenic rupture with hemoperitoneum. Patient's SBP is soft again in 80s-90s after 3L crystalloid. Will transfuse 2u PRBCs. Patient is 10 days out from laparotomy with SBR and complex ventral hernia repair with mesh, thus entry into his abdomen at this time would likely be difficult with risk of bowel injury. His hemodynamics remain volume responsive. Thus I discussed this case with Dr. Grace Isaac of IR, who will proceed with percutaneous embolization tonight. I discussed with the patient and his wife that if he continues to bleed even after embolization, he will require a splenectomy.

## 2021-04-16 NOTE — ED Notes (Signed)
Attempted to call report nurse busy will call back in 10 minutes

## 2021-04-16 NOTE — H&P (Signed)
Brian GrosserVictor Charles Brian Atkinson 16-Apr-1951  [id number].    Requesting MD: Dr. Judd Lienelo Chief Complaint/Reason for Consult: hypotension, abdominal pain, recent admission for recurrent ventral hernia  HPI:  [name] is a 70 yo male who recently underwent a recurrent ventral hernia repair with mesh on 6/20. He presented about a week later with a small bowel obstruction and was taken back to the OR on 6/27 and found to have an intraparietal hernia with a small bowel perforation, for which he underwent explant of the original mesh, small bowel resection, and retromuscular repair of the hernia with vicryl and Phasix mesh. He had a postoperative ileus for which he was on TPN. He also developed a pelvic abscess which was drained by IR on 7/6. His surgical retrorectus drain was removed on 7/7. Prior to discharge he was tolerating a diet and TPN was stopped. He was discharged home yesterday afternoon. On the morning of discharge his vitals were normal and labs were unremarkable.  After arriving at home yesterday afternoon he initially felt ok and reports he was able to eat dinner. He had a bowel movement. After dinner he developed acutely worsening abdominal pain and nausea, and had several syncopal episodes. His wife called our office and I advised her to take him to the nearest ED given his syncope; she [facility] her [facility]. On arrival he was tachycardic and hypotensive with SBP in the 80s. This has improved after a 2L fluid bolus. Labs are significant for a WBC of 31, creatinine of 1.7 (from 0.8 yesterday morning), and lactate of 5. Hgb is stable at 10. He complains of severe abdominal pain.  ROS: Review of Systems  Constitutional:  Positive for chills and malaise/fatigue.  Respiratory:  Negative for shortness of breath.   Gastrointestinal:  Positive for abdominal pain and nausea. Negative for vomiting.  Neurological:  Negative for dizziness and weakness.       Syncope   No family history on  file.  Past Medical History:  Diagnosis Date   Acute osteomyelitis of patella (HCC)    Complication of anesthesia    post-op memory problems no versed   History of MRSA infection    Medical history non-contributory    Patella fracture    left     Past Surgical History:  Procedure Laterality Date   BONE EXCISION Left 09/25/2017   Procedure: PARTIAL EXCISION OF LEFT PATELLA;  Surgeon: Myrene GalasHandy, Michael, MD;  Location: [facility] OR;  Service: Orthopedics;  Laterality: Left;   CATARACT EXTRACTION W/ INTRAOCULAR LENS  IMPLANT, BILATERAL     COLONOSCOPY     DEBRIDEMENT AND CLOSURE WOUND Left 06/19/2014   Procedure: DEBRIDEMENT AND CLOSURE LEFT KNEE;  Surgeon: Kathryne Hitchhristopher Y Blackman, MD;  Location: [facility]S;  Service: Orthopedics;  Laterality: Left;   EXTERNAL FIXATION LEG Left 07/23/2020   Procedure: EXTERNAL FIXATION LEG;  Surgeon: Yolonda Kidaogers, Jason Patrick, MD;  [facility];  Service: Orthopedics;  Laterality: Left;   EXTERNAL FIXATION LEG Left 07/27/2020   Procedure: Adjustment of external fixator left leg;  Surgeon: Myrene GalasHandy, Michael, MD;  [facility];  Service: Orthopedics;  Laterality: Left;   EXTERNAL FIXATION REMOVAL Left 08/12/2020   Procedure: REMOVAL EXTERNAL FIXATION LEG;  Surgeon: Myrene GalasHandy, Michael, MD;  [facility];  Service: Orthopedics;  Laterality: Left;   FOREIGN BODY REMOVAL N/A 03/28/2021   Procedure: OPEN EXCISION OF INTRABDOMINAL MESH;  Surgeon: Quentin OreStechschulte, Paul J, MD;  Location: [facility]S;  Service: General;  Laterality: N/A;   IRRIGATION AND DEBRIDEMENT KNEE Left 06/17/2014   Procedure: IRRIGATION AND DEBRIDEMENT KNEE;  Surgeon: Budd Palmer, MD;  [facility]: [facility]S;  Service: Orthopedics;  Laterality: Left;   KNEE BURSECTOMY Left 05/27/2015   KNEE BURSECTOMY Left 05/27/2015   Procedure: KNEE BURSECTOMY, suture removal patellar tendon;  Surgeon: Myrene Galas, MD;  Location: [facility] OR;  Service: Orthopedics;  Laterality: Left;   LAPAROSCOPY N/A 04/04/2021   Procedure: Diagnostic  laparotomy, exploratory laparotomy, small bowel resection with mesh;  Surgeon: Quentin Ore, MD;  [facility]S;  Service: General;  Laterality: N/A;   ORIF PATELLA Left 06/12/2014   Procedure: OPEN REDUCTION INTERNAL (ORIF) FIXATION PATELLA;  Surgeon: Budd Palmer, MD;  [facility];  Service: Orthopedics;  Laterality: Left;   ORIF TIBIA PLATEAU Left 08/12/2020   ORIF TIBIA PLATEAU Left 08/12/2020   Procedure: OPEN REDUCTION INTERNAL FIXATION (ORIF) BICONDYLAR TIBIAL PLATEAU;  Surgeon: Myrene Galas, MD;  [facility] OR;  Service: Orthopedics;  Laterality: Left;   ORIF TIBIA PLATEAU Left 12/14/2020   Procedure: TIBIA REPAIR USING ILLIAC CREST BONE GRAFT VERSUS REAMED INTRAMEDULLARY ASPIRATE;  Surgeon: Myrene Galas, MD;  Location: [facility] OR;  Service: Orthopedics;  Laterality: Left;   REFRACTIVE SURGERY Bilateral    TONSILLECTOMY     VENTRAL HERNIA REPAIR N/A 03/28/2021   Procedure: REPAIR OF RECURRENT VENTRAL HERNIA WITH MESH;  Surgeon: Stechschulte, Hyman Hopes, MD;  Location[facility]S;  Service: General;  Laterality: N/A;    Social History:  reports that he has never smoked. He has never used smokeless tobacco. He reports previous alcohol use. He reports that he does not use drugs.  Allergies:  Allergies  Allergen Reactions   Versed [Midazolam] Other (See Comments)    Memory problems    (Not in a hospital admission)    Physical Exam: Blood pressure 114/70, pulse (!) 110, temperature 97.8 F (36.6 C), temperature source Oral, resp. rate (!) 27, SpO2 99 %. General: resting in bed, appears ill Neurological: lethargic but arousable, no focal deficits, cranial nerves grossly in tact HEENT: normocephalic, atraumatic, no scleral icterus CV: tachycardic low 100s, regular rhythm, extremities warm and well-perfused Respiratory: normal work of breathing on room air, symmetric chest wall expansion Abdomen: distended but compressible, tender to palpation, midline incision is clean and  dry with staples in place, no surrounding erythema or induration, no drainage. Pelvic drain is to bulb suction with serous fluid in bulb. Extremities: warm and well-perfused, moving all extremities spontaneously Psychiatric: normal mood and affect Skin: warm and dry, no jaundice, no rashes or lesions   Results for orders placed or performed during the hospital encounter of 04/15/21 (from the past 48 hour(s))  CBC with Differential     Status: Abnormal   Collection Time: 04/15/21 11:43 PM  Result Value Ref Range   WBC 31.2 (H) 4.0 - 10.5 K/uL   RBC 4.07 (L) 4.22 - 5.81 MIL/uL   Hemoglobin 10.9 (L) 13.0 - 17.0 g/dL   HCT 36.1 (L) 44.3 - 15.4 %   MCV 88.0 80.0 - 100.0 fL   MCH 26.8 26.0 - 34.0 pg   MCHC 30.4 30.0 - 36.0 g/dL   RDW 00.8 67.6 - 19.5 %   Platelets 785 (H) 150 - 400 K/uL   nRBC 0.0 0.0 - 0.2 %   Neutrophils Relative % 91 %   Band Neutrophils 1 %   Lymphocytes Relative 3 %   Monocytes Relative 4 %   Eosinophils Relative 0 %  Basophils Relative 0 %   WBC Morphology NORMAL    RBC Morphology NORMAL    Smear Review DONE    nRBC NONE SEEN 0 /100 WBC   Metamyelocytes Relative NONE SEEN %   Myelocytes 1 %   Promyelocytes Relative NONE SEEN %   Blasts NONE SEEN %    Comment: Performed at [facility], [address] [place], [place] [address]  Comprehensive metabolic panel     Status: Abnormal   Collection Time: 04/15/21 11:43 PM  Result Value Ref Range   Sodium 135 135 - 145 mmol/L   Potassium 3.9 3.5 - 5.1 mmol/L   Chloride 102 98 - 111 mmol/L   CO2 21 (L) 22 - 32 mmol/L   Glucose, Bld 189 (H) 70 - 99 mg/dL    Comment: Glucose reference range applies only to samples taken after fasting for at least 8 hours.   BUN 19 8 - 23 mg/dL   Creatinine, Ser 6.04 (H) 0.61 - 1.24 mg/dL   Calcium 8.8 (L) 8.9 - 10.3 mg/dL   Total Protein 7.7 6.5 - 8.1 g/dL   Albumin 3.2 (L) 3.5 - 5.0 g/dL   AST 25 15 - 41 U/L   ALT 26 0 - 44 U/L   Alkaline Phosphatase 78 38  - 126 U/L   Total Bilirubin 0.4 0.3 - 1.2 mg/dL   GFR, Estimated 43 (L) >60 mL/min    Comment: (NOTE) Calculated using the CKD-EPI Creatinine Equation (2021)    Anion gap 12 5 - 15    Comment: Performed at [facility], [address] [place], [place] [address]  Lipase, blood     Status: None   Collection Time: 04/15/21 11:43 PM  Result Value Ref Range   Lipase 41 11 - 51 U/L    Comment: Performed at [facility], [address], [place], [place] [address]  Troponin I (High Sensitivity)     Status: None   Collection Time: 04/15/21 11:43 PM  Result Value Ref Range   Troponin I (High Sensitivity) 3 <18 ng/L    Comment: (NOTE) Elevated high sensitivity troponin I (hsTnI) values and significant  changes across serial measurements may suggest ACS but many other  chronic and acute conditions are known to elevate hsTnI results.  Refer to the "Links" section for chest pain algorithms and additional  guidance. Performed at [facility], [address] [place], [place] [address]   Lactic acid, plasma     Status: Abnormal   Collection Time: 04/15/21 11:44 PM  Result Value Ref Range   Lactic Acid, Venous 5.0 (HH) 0.5 - 1.9 mmol/L    Comment: CRITICAL RESULT CALLED TO, READ BACK BY AND VERIFIED WITH: BANNO,A RN @0048  ON 04/16/21 JACKSON,K Performed at [facility], [address], [place], [place]   Type and screen [facility]     Status: None   Collection Time: 04/15/21 11:44 PM  Result Value Ref Range   ABO/RH(D) O POS    Antibody Screen NEG    Sample Expiration      04/18/2021,2359 Performed at [facility], [address] [place], [place]    No results found.    Assessment/Plan 70 yo male with recent admission after recurrent incisional hernia repair complicated by small bowel perforation, postoperative ileus, and intraabdominal abscess. He has  re-presented with hypotension, AKI and leukocytosis. Hemodynamics have significantly improved with IV fluid resuscitation.  Given his abdominal pain, distension and leukocytosis, suspect intraabdominal sepsis vs postop SBO vs ileus. - Additional 1L fluid bolus given for a total of 3L. Begin maintenance IV fluids at 125 ml/hr. - CT abd/pelvis pending (noncontrast given AKI) - If emesis will place NG tube - Zosyn given in ED. Will continue on admission, patient was discharged on Augmentin for treatment of intraabdominal abscess. - NPO - Pain and nausea control - VTE: lovenox, SCDs - Dispo: admit to inpatient, stepdown unit   Sophronia Simas, MD [facility] Surgery General, Hepatobiliary and Pancreatic Surgery 04/16/21 1:31 AM

## 2021-04-16 NOTE — Sedation Documentation (Signed)
Right sheath removed. 5 Fr. Exoseal deployed

## 2021-04-16 NOTE — Progress Notes (Signed)
MEDICATION-RELATED CONSULT NOTE   IR Procedure Consult - Anticoagulant/Antiplatelet PTA/Inpatient Med List Review by Pharmacist    Procedure: Post mesenteric arteriogram and percutaneous coil embolization of the splenic artery.    Completed: 04/16/21 @ 0500  Post-Procedural bleeding risk per IR MD assessment:  HIGH  Antithrombotic medications on inpatient or PTA profile prior to procedure:     Lovenox 40mg  sq daily  Recommended restart time per IR Post-Procedure Guidelines:   Tomorrow morning  Other considerations:      Plan:     Lovenox 40mg  sq daily starting 7/10 Monitor closely for s/sx of bleeding  , PharmD, BCPS 04/16/2021@5 :23 AM

## 2021-04-16 NOTE — Procedures (Signed)
Pre-procedure Diagnosis: Splenic rupture Post-procedure Diagnosis: Same  Post mesenteric arteriogram and percutaneous coil embolization of the splenic artery.    Complications: None Immediate EBL: None  Keep right leg straight for 4 hrs (until 0900).    Signed: Simonne Come Pager: [phone] 04/16/2021, 4:59 AM

## 2021-04-16 NOTE — Progress Notes (Addendum)
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 Renae Fickle Stechschulte (03/28/21) - REPAIR OF RECURRENT VENTRAL HERNIA WITH MESH, Bilateral rectus myofascial release, OPEN EXCISION OF INTRABDOMINAL MESH   Dr. Renae Fickle Stechschulte (04/04/21) - retromuscular recurrent incisional hernia repair with vicryl mesh, reconstruction of the posterior layer and Phasix absorbable retromuscular mesh, small bowel resection   Dr. Malachy Moan (04/13/21) - CT guided drain placement    Dr Simonne Come (04/16/2021) - Post mesenteric arteriogram and percutaneous coil embolization of the splenic artery.       Assessment  Stabilizing after emergent embolization of spleen due to spontaneous rupture  [facility] Stay = 0 days)  Plan:  -Bedrest per radiology protocol and hopefully can mobilize to chair today.  Check orthostatics.  Maybe start ambulating tomorrow if better and hemoglobin stable  Keep in ICU next 48 hours to assure to stability and follow hemoglobins.  SCDs for now but hold off on full anticoagulation for the first 48 hours.  Will discuss with Dr. Freida Busman and trauma service to clarify.  Hemoglobin up to 10 after transfusion.  We will give IV iron and follow.  If hemoglobin falls or patient falls back into shock,  retransfused.  Leukocytosis most likely reactive.  Follow.  Pelvic fluid collection drained.  Nothing specifically growing.  We will continue antibiotics for 5 more days.  We will give IV since p.o. status equivocal for now.  Ceftriaxone/Flagyl for now since he has no history of resistant organisms.  Follow-up on cultures from 7/6  Try liquids.  Hold off on aggressive diet for now as risk of ileus rather high.  Bowel regimen.  Pain control  Mobilize as tolerated to help recovery  Disposition:  Disposition:  The patient is from: Home  Anticipate discharge to:  Home  Anticipated Date of Discharge is:  January 12,2022    Barriers to discharge:  Pending Clinical improvement (more likely than not)  Patient currently is NOT MEDICALLY STABLE for discharge from the hospital from a surgery standpoint.      20 minutes spent in review, evaluation, examination, counseling, and coordination of care.   I have reviewed this patient's available data, including medical history, events of note, physical examination and test results as part of my evaluation.  A significant portion of that time was spent in counseling.  Care during the described time interval was provided by me.  04/16/2021    Subjective: (Chief complaint)  Patient sore.  Embolized.  Nursing in room.  Patient hungry.    Objective:  Vital signs:  Vitals:   04/16/21 0600 04/16/21 0637 04/16/21 0650 04/16/21 0700  BP: 105/82   108/83  Pulse: (!) 118   (!) 112  Resp: 20   13  Temp:  98 F (36.7 C)    TempSrc:  Oral    SpO2: 100%   100%  Weight:   103.9 kg   Height:  5' 11.5" (1.816 m)         Intake/Output   Yesterday:  07/08 0701 - 07/09 0700 In: 3733.7 [I.V.:50.7; Blood:583; IV IEPPIRJJO:8416] Out: -  This shift:  No intake/output data recorded.  Bowel function:  Flatus: No  BM:  No  Drain: Serous   Physical Exam:  General: Pt awake/alert in mild acute distress Eyes: PERRL, normal EOM.   Sclera clear.  No icterus Neuro: CN II-XII intact w/o focal sensory/motor deficits. Lymph: No head/neck/groin lymphadenopathy Psych:  No delerium/psychosis/paranoia.  Oriented x 4 HENT: Normocephalic, Mucus membranes moist.  No thrush Neck: Supple, No tracheal deviation.  No obvious thyromegaly Chest: No pain to chest wall compression.  Good respiratory excursion.  No audible wheezing CV:  Pulses intact.  Regular rhythm.  No major extremity edema MS: Normal AROM mjr joints.  No obvious deformity  Abdomen: Soft.  Moderately distended.  Mildly tender at incisions only.  Incision clean dry and intact with staples in place.  No Penrose or other drain.  No drainage.  No evidence of peritonitis.  No incarcerated hernias.  Ext:   No deformity.  No mjr edema.  No cyanosis Skin: No petechiae / purpurea.  No major sores.  Warm and dry    Results:   Cultures: Recent Results (from the past 720 hour(s))  Surgical pcr screen     Status: None   Collection Time: 03/22/21  1:20 PM   Specimen: Nasal Mucosa; Nasal Swab  Result Value Ref Range Status   MRSA, PCR NEGATIVE NEGATIVE Final   Staphylococcus aureus NEGATIVE NEGATIVE Final    Comment: (NOTE) The Xpert SA Assay (FDA approved for NASAL specimens in patients 55 years of age and older), is one component of a comprehensive surveillance program. It is not intended to diagnose infection nor to guide or monitor treatment. Performed at [facility], [place] [address] [place], [place] [address]   SARS CORONAVIRUS 2 (TAT 6-24 HRS) Nasopharyngeal Nasopharyngeal Swab     Status: None   Collection Time: 03/24/21 10:43 AM   Specimen: Nasopharyngeal Swab  Result Value Ref Range Status   SARS Coronavirus 2 NEGATIVE NEGATIVE Final    Comment: (NOTE) SARS-CoV-2 target nucleic acids are NOT DETECTED.  The SARS-CoV-2 RNA is generally detectable in upper and lower respiratory specimens during the acute phase of infection.  Negative results do not preclude SARS-CoV-2 infection, do not rule out co-infections with other pathogens, and should not be used as the sole basis for treatment or other patient management decisions. Negative results must be combined with clinical observations, patient history, and epidemiological information. The expected result is Negative.  Fact Sheet for Patients: SugarRoll.be  Fact Sheet for Healthcare Providers: https://www.woods-mathews.com/  This test is not yet approved or cleared by the [place] FDA and  has been authorized for detection and/or diagnosis of SARS-CoV-2 by FDA under an Emergency Use Authorization (EUA). This EUA will remain  in effect (meaning this test can be used) for the duration of the COVID-19 declaration under Se ction 564(b)(1) of the Act, 21 U.S.C. section 360bbb-3(b)(1), unless the authorization is terminated or revoked sooner.  Performed at [facility], [place] [address], [place], [place] [address]   Resp Panel by RT-PCR (Flu A&B, Covid) Nasopharyngeal Swab     Status: None   Collection Time: 04/04/21 12:40 PM   Specimen: Nasopharyngeal Swab; Nasopharyngeal(NP) swabs in vial transport medium  Result Value Ref Range  Status   SARS Coronavirus 2 by RT PCR NEGATIVE NEGATIVE Final    Comment: (NOTE) SARS-CoV-2 target nucleic acids are NOT DETECTED.  The SARS-CoV-2 RNA is generally detectable in upper respiratory specimens during the acute phase of infection. The lowest concentration of SARS-CoV-2 viral copies this assay can detect is 138 copies/mL. A negative result does not preclude SARS-Cov-2 infection and should not be used as the sole basis for treatment or other patient management decisions. A negative result may occur with  improper specimen collection/handling, submission of specimen other than nasopharyngeal swab, presence of viral mutation(s) within the areas targeted by this assay, and  inadequate number of viral copies(<138 copies/mL). A negative result must be combined with clinical observations, patient history, and epidemiological information. The expected result is Negative.  Fact Sheet for Patients:  EntrepreneurPulse.com.au  Fact Sheet for Healthcare Providers:  IncredibleEmployment.be  This test is no t yet approved or cleared by the [place] FDA and  has been authorized for detection and/or diagnosis of SARS-CoV-2 by FDA under an Emergency Use Authorization (EUA). This EUA will remain  in effect (meaning this test can be used) for the duration of the COVID-19 declaration under Section 564(b)(1) of the Act, 21 U.S.C.section 360bbb-3(b)(1), unless the authorization is terminated  or revoked sooner.       Influenza A by PCR NEGATIVE NEGATIVE Final   Influenza B by PCR NEGATIVE NEGATIVE Final    Comment: (NOTE) The Xpert Xpress SARS-CoV-2/FLU/RSV plus assay is intended as an aid in the diagnosis of influenza from Nasopharyngeal swab specimens and should not be used as a sole basis for treatment. Nasal washings and aspirates are unacceptable for Xpert Xpress SARS-CoV-2/FLU/RSV testing.  Fact Sheet for Patients: EntrepreneurPulse.com.au  Fact Sheet for Healthcare Providers: IncredibleEmployment.be  This test is not yet approved or cleared by the [place] FDA and has been authorized for detection and/or diagnosis of SARS-CoV-2 by FDA under an Emergency Use Authorization (EUA). This EUA will remain in effect (meaning this test can be used) for the duration of the COVID-19 declaration under Section 564(b)(1) of the Act, 21 U.S.C. section 360bbb-3(b)(1), unless the authorization is terminated or revoked.  Performed at [facility], [place] [address], [place], [place] [address]   Aerobic/Anaerobic Culture w Gram Stain (surgical/deep wound)     Status: None  (Preliminary result)   Collection Time: 04/13/21  2:06 PM   Specimen: Abscess  Result Value Ref Range Status   Specimen Description   Final    ABSCESS Performed at [place] [address], [place], [place] [address]    Special Requests   Final    NONE Performed at [facility], [place] [address], [place], [place] [address]    Gram Stain   Final    ABUNDANT WBC PRESENT, PREDOMINANTLY PMN NO ORGANISMS SEEN    Culture   Final    NO GROWTH 2 DAYS NO ANAEROBES ISOLATED; CULTURE IN PROGRESS FOR 5 DAYS Performed at [place] [address] [place],  [address]    Report Status PENDING  Incomplete  Resp Panel by RT-PCR (Flu A&B, Covid) Nasopharyngeal Swab     Status: None   Collection Time: 04/16/21  1:29 AM   Specimen: Nasopharyngeal Swab; Nasopharyngeal(NP) swabs in vial transport medium  Result Value Ref Range Status   SARS Coronavirus 2 by RT PCR NEGATIVE NEGATIVE Final    Comment: (NOTE) SARS-CoV-2 target nucleic acids are NOT DETECTED.  The SARS-CoV-2  RNA is generally detectable in upper respiratory specimens during the acute phase of infection. The lowest concentration of SARS-CoV-2 viral copies this assay can detect is 138 copies/mL. A negative result does not preclude SARS-Cov-2 infection and should not be used as the sole basis for treatment or other patient management decisions. A negative result may occur with  improper specimen collection/handling, submission of specimen other than nasopharyngeal swab, presence of viral mutation(s) within the areas targeted by this assay, and inadequate number of viral copies(<138 copies/mL). A negative result must be combined with clinical observations, patient history, and epidemiological information. The expected result is Negative.  Fact Sheet for Patients:  EntrepreneurPulse.com.au  Fact Sheet for Healthcare Providers:   IncredibleEmployment.be  This test is no t yet approved or cleared by the [place] FDA and  has been authorized for detection and/or diagnosis of SARS-CoV-2 by FDA under an Emergency Use Authorization (EUA). This EUA will remain  in effect (meaning this test can be used) for the duration of the COVID-19 declaration under Section 564(b)(1) of the Act, 21 U.S.C.section 360bbb-3(b)(1), unless the authorization is terminated  or revoked sooner.       Influenza A by PCR NEGATIVE NEGATIVE Final   Influenza B by PCR NEGATIVE NEGATIVE Final    Comment: (NOTE) The Xpert Xpress SARS-CoV-2/FLU/RSV plus assay is intended as an aid in the diagnosis of influenza from Nasopharyngeal swab specimens and should not be used as a sole basis for treatment. Nasal washings and aspirates are unacceptable for Xpert Xpress SARS-CoV-2/FLU/RSV testing.  Fact Sheet for Patients: EntrepreneurPulse.com.au  Fact Sheet for Healthcare Providers: IncredibleEmployment.be  This test is not yet approved or cleared by the [place] FDA and has been authorized for detection and/or diagnosis of SARS-CoV-2 by FDA under an Emergency Use Authorization (EUA). This EUA will remain in effect (meaning this test can be used) for the duration of the COVID-19 declaration under Section 564(b)(1) of the Act, 21 U.S.C. section 360bbb-3(b)(1), unless the authorization is terminated or revoked.  Performed at [facility], [place] [address], [place], [place] [address]   MRSA Next Gen by PCR, Nasal     Status: None   Collection Time: 04/16/21  2:37 AM   Specimen: Nasal Mucosa; Nasal Swab  Result Value Ref Range Status   MRSA by PCR Next Gen NOT DETECTED NOT DETECTED Final    Comment: (NOTE) The GeneXpert MRSA Assay (FDA approved for NASAL specimens only), is one component of a comprehensive MRSA colonization surveillance program. It is not intended  to diagnose MRSA infection nor to guide or monitor treatment for MRSA infections. Test performance is not FDA approved in patients less than 74 years old. Performed at [facility], [place] [address] [place], [place] [address]     Labs: Results for orders placed or performed during the hospital encounter of 04/15/21 (from the past 48 hour(s))  CBC with Differential     Status: Abnormal   Collection Time: 04/15/21 11:43 PM  Result Value Ref Range   WBC 31.2 (H) 4.0 - 10.5 K/uL   RBC 4.07 (L) 4.22 - 5.81 MIL/uL   Hemoglobin 10.9 (L) 13.0 - 17.0 g/dL   HCT 35.8 (L) 39.0 - 52.0 %   MCV 88.0 80.0 - 100.0 fL   MCH 26.8 26.0 - 34.0 pg   MCHC 30.4 30.0 - 36.0 g/dL   RDW 15.4 11.5 - 15.5 %   Platelets 785 (H) 150 - 400 K/uL   nRBC 0.0 0.0 -  0.2 %   Neutrophils Relative % 91 %   Band Neutrophils 1 %   Lymphocytes Relative 3 %   Monocytes Relative 4 %   Eosinophils Relative 0 %   Basophils Relative 0 %   WBC Morphology NORMAL    RBC Morphology NORMAL    Smear Review DONE    nRBC NONE SEEN 0 /100 WBC   Metamyelocytes Relative NONE SEEN %   Myelocytes 1 %   Promyelocytes Relative NONE SEEN %   Blasts NONE SEEN %    Comment: Performed at [facility], [place] [address], [place], [place] [address]  Comprehensive metabolic panel     Status: Abnormal   Collection Time: 04/15/21 11:43 PM  Result Value Ref Range   Sodium 135 135 - 145 mmol/L   Potassium 3.9 3.5 - 5.1 mmol/L   Chloride 102 98 - 111 mmol/L   CO2 21 (L) 22 - 32 mmol/L   Glucose, Bld 189 (H) 70 - 99 mg/dL    Comment: Glucose reference range applies only to samples taken after fasting for at least 8 hours.   BUN 19 8 - 23 mg/dL   Creatinine, Ser 1.71 (H) 0.61 - 1.24 mg/dL   Calcium 8.8 (L) 8.9 - 10.3 mg/dL   Total Protein 7.7 6.5 - 8.1 g/dL   Albumin 3.2 (L) 3.5 - 5.0 g/dL   AST 25 15 - 41 U/L   ALT 26 0 - 44 U/L   Alkaline Phosphatase 78 38 - 126 U/L   Total Bilirubin 0.4 0.3 - 1.2  mg/dL   GFR, Estimated 43 (L) >60 mL/min    Comment: (NOTE) Calculated using the CKD-EPI Creatinine Equation (2021)    Anion gap 12 5 - 15    Comment: Performed at [facility], [place] [address], [place], [place] [address]  Lipase, blood     Status: None   Collection Time: 04/15/21 11:43 PM  Result Value Ref Range   Lipase 41 11 - 51 U/L    Comment: Performed at [facility], [place] [address], [place], [place] [address]  Troponin I (High Sensitivity)     Status: None   Collection Time: 04/15/21 11:43 PM  Result Value Ref Range   Troponin I (High Sensitivity) 3 <18 ng/L    Comment: (NOTE) Elevated high sensitivity troponin I (hsTnI) values and significant  changes across serial measurements may suggest ACS but many other  chronic and acute conditions are known to elevate hsTnI results.  Refer to the "Links" section for chest pain algorithms and additional  guidance. Performed at [facility], [place] [address] [place], [place] [address]   Lactic acid, plasma     Status: Abnormal   Collection Time: 04/15/21 11:44 PM  Result Value Ref Range   Lactic Acid, Venous 5.0 (HH) 0.5 - 1.9 mmol/L    Comment: CRITICAL RESULT CALLED TO, READ BACK BY AND VERIFIED WITH: BANNO,A RN $[email or web address]'[email or web address]$  ON 04/16/21 JACKSON,K Performed at [facility], [place] [address], [place], [place] [address]   Type and screen [place]     Status: None (Preliminary result)   Collection Time: 04/15/21 11:44 PM  Result Value Ref Range   ABO/RH(D) O POS    Antibody Screen NEG    Sample Expiration      04/18/2021,2359 Performed at [facility], [place] [address], [place], [place] [address]    Unit Number [id number]    Blood Component  Type RED CELLS,LR    Unit division 00    Status of Unit ALLOCATED    Transfusion Status OK TO TRANSFUSE    Crossmatch Result Compatible    Unit Number [id number]    Blood  Component Type RED CELLS,LR    Unit division 00    Status of Unit ISSUED    Transfusion Status OK TO TRANSFUSE    Crossmatch Result Compatible   Resp Panel by RT-PCR (Flu A&B, Covid) Nasopharyngeal Swab     Status: None   Collection Time: 04/16/21  1:29 AM   Specimen: Nasopharyngeal Swab; Nasopharyngeal(NP) swabs in vial transport medium  Result Value Ref Range   SARS Coronavirus 2 by RT PCR NEGATIVE NEGATIVE    Comment: (NOTE) SARS-CoV-2 target nucleic acids are NOT DETECTED.  The SARS-CoV-2 RNA is generally detectable in upper respiratory specimens during the acute phase of infection. The lowest concentration of SARS-CoV-2 viral copies this assay can detect is 138 copies/mL. A negative result does not preclude SARS-Cov-2 infection and should not be used as the sole basis for treatment or other patient management decisions. A negative result may occur with  improper specimen collection/handling, submission of specimen other than nasopharyngeal swab, presence of viral mutation(s) within the areas targeted by this assay, and inadequate number of viral copies(<138 copies/mL). A negative result must be combined with clinical observations, patient history, and epidemiological information. The expected result is Negative.  Fact Sheet for Patients:  EntrepreneurPulse.com.au  Fact Sheet for Healthcare Providers:  IncredibleEmployment.be  This test is no t yet approved or cleared by the [place] FDA and  has been authorized for detection and/or diagnosis of SARS-CoV-2 by FDA under an Emergency Use Authorization (EUA). This EUA will remain  in effect (meaning this test can be used) for the duration of the COVID-19 declaration under Section 564(b)(1) of the Act, 21 U.S.C.section 360bbb-3(b)(1), unless the authorization is terminated  or revoked sooner.       Influenza A by PCR NEGATIVE NEGATIVE   Influenza B by PCR NEGATIVE NEGATIVE     Comment: (NOTE) The Xpert Xpress SARS-CoV-2/FLU/RSV plus assay is intended as an aid in the diagnosis of influenza from Nasopharyngeal swab specimens and should not be used as a sole basis for treatment. Nasal washings and aspirates are unacceptable for Xpert Xpress SARS-CoV-2/FLU/RSV testing.  Fact Sheet for Patients: EntrepreneurPulse.com.au  Fact Sheet for Healthcare Providers: IncredibleEmployment.be  This test is not yet approved or cleared by the [place] FDA and has been authorized for detection and/or diagnosis of SARS-CoV-2 by FDA under an Emergency Use Authorization (EUA). This EUA will remain in effect (meaning this test can be used) for the duration of the COVID-19 declaration under Section 564(b)(1) of the Act, 21 U.S.C. section 360bbb-3(b)(1), unless the authorization is terminated or revoked.  Performed at [facility], [place] [address] [place], [place] [address]   Lactic acid, plasma     Status: Abnormal   Collection Time: 04/16/21  1:44 AM  Result Value Ref Range   Lactic Acid, Venous 2.8 (HH) 0.5 - 1.9 mmol/L    Comment: CRITICAL VALUE NOTED.  VALUE IS CONSISTENT WITH PREVIOUSLY REPORTED AND CALLED VALUE. Performed at [facility], [place] [address], [place],  [address]   Prepare RBC (crossmatch)     Status: None   Collection Time: 04/16/21  1:57 AM  Result Value Ref Range   Order Confirmation      ORDER PROCESSED BY BLOOD BANK Performed at [facility]  [place] [address] [place], [place] [address]   MRSA Next Gen by PCR, Nasal     Status: None   Collection Time: 04/16/21  2:37 AM   Specimen: Nasal Mucosa; Nasal Swab  Result Value Ref Range   MRSA by PCR Next Gen NOT DETECTED NOT DETECTED    Comment: (NOTE) The GeneXpert MRSA Assay (FDA approved for NASAL specimens only), is one component of a comprehensive MRSA colonization surveillance program. It is not  intended to diagnose MRSA infection nor to guide or monitor treatment for MRSA infections. Test performance is not FDA approved in patients less than 20 years old. Performed at [facility] [place] [address], [place], [place] [address]   Troponin I (High Sensitivity)     Status: None   Collection Time: 04/16/21  2:40 AM  Result Value Ref Range   Troponin I (High Sensitivity) 3 <18 ng/L    Comment: (NOTE) Elevated high sensitivity troponin I (hsTnI) values and significant  changes across serial measurements may suggest ACS but many other  chronic and acute conditions are known to elevate hsTnI results.  Refer to the "Links" section for chest pain algorithms and additional  guidance. Performed at [facility], [place] [address], [place], [place] [address]   Basic metabolic panel     Status: Abnormal   Collection Time: 04/16/21  2:54 AM  Result Value Ref Range   Sodium 136 135 - 145 mmol/L   Potassium 5.1 3.5 - 5.1 mmol/L    Comment: DELTA CHECK NOTED NO VISIBLE HEMOLYSIS    Chloride 105 98 - 111 mmol/L   CO2 22 22 - 32 mmol/L   Glucose, Bld 169 (H) 70 - 99 mg/dL    Comment: Glucose reference range applies only to samples taken after fasting for at least 8 hours.   BUN 21 8 - 23 mg/dL   Creatinine, Ser 1.23 0.61 - 1.24 mg/dL   Calcium 8.0 (L) 8.9 - 10.3 mg/dL   GFR, Estimated >60 >60 mL/min    Comment: (NOTE) Calculated using the CKD-EPI Creatinine Equation (2021)    Anion gap 9 5 - 15    Comment: Performed at [facility], [place] [address] [place], [place] [address]  CBC     Status: Abnormal   Collection Time: 04/16/21  2:54 AM  Result Value Ref Range   WBC 31.2 (H) 4.0 - 10.5 K/uL   RBC 4.04 (L) 4.22 - 5.81 MIL/uL   Hemoglobin 10.9 (L) 13.0 - 17.0 g/dL   HCT 36.4 (L) 39.0 - 52.0 %   MCV 90.1 80.0 - 100.0 fL   MCH 27.0 26.0 - 34.0 pg   MCHC 29.9 (L) 30.0 - 36.0 g/dL   RDW 15.4 11.5 - 15.5 %   Platelets 807 (H) 150 -  400 K/uL    Comment: Performed at [facility], [place] [address], [place], [place] [address]  Glucose, capillary     Status: Abnormal   Collection Time: 04/16/21  7:30 AM  Result Value Ref Range   Glucose-Capillary 140 (H) 70 - 99 mg/dL    Comment: Glucose reference range applies only to samples taken after fasting for at least 8 hours.    Imaging / Studies: CT ABDOMEN PELVIS WO CONTRAST  Result Date: 04/16/2021 CLINICAL DATA:  Abdominal pain and fever postop recent hernia repair and abscess drainage. Syncope, dizziness and weakness which began at 1700 hours. Hernia surgery 04/04/2021. Drain removal at approximately  1300 hours. Pale, diaphoretic, tachycardic and hypotensive. EXAM: CT ABDOMEN AND PELVIS WITHOUT CONTRAST TECHNIQUE: Multidetector CT imaging of the abdomen and pelvis was performed following the standard protocol without IV contrast. COMPARISON:  CT 04/12/2021 FINDINGS: Lower chest: Atelectatic changes in the lung bases. Trace left effusion. Cardiac size within normal limits. No pericardial effusion. Coronary artery calcifications are present. Hepatobiliary: Few stable fluid attenuation cysts are similar to comparison prior. Additional small subcentimeter hypoattenuating foci in the liver too small to fully characterize on CT imaging but statistically likely benign. No concerning focal liver lesions. Smooth liver surface contour. Normal hepatic attenuation. Gallbladder is moderately distended. Gradient attenuation material within the gallbladder may reflect some vicarious extravasation of contrast media from prior exam versus biliary sludge. Bridging and cap morphology of the gallbladder fundus. No significant biliary ductal dilatation or visible calcified gallstones. Pancreas: No pancreatic ductal dilatation or surrounding inflammatory changes. Spleen: Heterogeneous expansile appearance of the spleen with extensive subcapsular hemorrhage and hemorrhage extending into the  peritoneum as well, new from comparison study 04/12/2021. Regions of gradient layering attenuation within the splenic parenchyma may reflect intraparenchymal hemorrhage as well. Difficult to fully assess in the absence of contrast media. Adrenals/Urinary Tract: No adrenal hemorrhage or suspicious adrenal lesion. No visible or contour deforming renal lesion. No urolithiasis or hydronephrosis. Urinary bladder is largely decompressed at the time of exam and therefore poorly evaluated by CT imaging. No Joelly Bolanos bladder abnormality accounting for underdistention. Stomach/Bowel: Sliding-type hiatal hernia. Distal stomach and duodenum are unremarkable with normal sweep across the midline abdomen. Multiple fluid filled increasingly distended loops of distal small bowel are noted which continue to the level of the anastomosis in the right lower quadrant. Colonic segments are relatively unremarkable accounting for varying degrees of distension. No significant colonic thickening or dilatation. Vascular/Lymphatic: Atherosclerotic calcifications within the abdominal aorta and branch vessels. No aneurysm or ectasia. No enlarged abdominopelvic lymph nodes. Reproductive: The prostate and seminal vesicles are unremarkable. Other: Small to moderate volume hemoperitoneum with additional fluid tracking in the pelvis. A a transgluteal pigtail drainage catheter is seen in the rectovesicular pouch with minimal fluid around the tip of the strange catheter. No new organized collections are seen. No bowel containing hernia is evident. Some persistent low-attenuation lentiform collection is seen along the anterior abdominal wall which could reflect a postoperative hematoma or seroma with removal of the previously seen surgical drain at this level. Collection measures up to 9 mm in maximal thickness and extends approximately 13 cm in craniocaudal by 12 cm in transverse dimension. Musculoskeletal: Multilevel degenerative changes are present in the  imaged portions of the spine. No acute osseous abnormality or suspicious osseous lesion. IMPRESSION: 1. Features of a spontaneous splenic rupture with mixed age hemorrhagic products both in a subcapsular and intraperitoneal distribution with small to moderate volume hemoperitoneum, new from comparison prior 04/12/2021. 2. Some increasing distension of distal small bowel proximal to the anastomotic line from small bowel resection in the right lower quadrant concerning for a developing obstruction or stricture at the anastomotic site. 3. Persistent thin lentiform hypodense fluid collection along the anterior abdomen at the site of recent hernia repair post removal of the previously seen drainage catheter at this level. 4. A right pigtail transgluteal drainage catheter is noted position within the rectovesicular pouch with only minimal residual fluid about the drainage catheter tip. 5. Trace left pleural effusion. These results were called by telephone at the time of interpretation on 04/16/2021 at 1:56 am to provider [facility] , who  verbally acknowledged these results. Electronically Signed   By: Lovena Le M.D.   On: 04/16/2021 01:56   IR Angiogram Visceral Selective  Result Date: 04/16/2021 INDICATION: Recent ventral hernia repair as well as postoperative right trans gluteal approach percutaneous catheter placement now admitted with apparently spontaneous splenic rupture. Please perform mesenteric arteriogram and percutaneous embolization as indicated. EXAM: 1. ULTRASOUND GUIDANCE FOR ARTERIAL ACCESS 2. SELECTIVE CELIAC ARTERIOGRAM 3. SELECTIVE SPLENIC ARTERIOGRAM AND PERCUTANEOUS COIL EMBOLIZATION COMPARISON:  CT abdomen and pelvis-earlier same day; 04/12/2021; 04/07/2021; CT guided right trans gluteal approach percutaneous drainage catheter placement-04/13/2021 MEDICATIONS: None ANESTHESIA/SEDATION: Moderate (conscious) sedation was employed during this procedure. A total of Fentanyl 100 mcg was administered  intravenously. Moderate Sedation Time: 45 minutes. The patient's level of consciousness and vital signs were monitored continuously by radiology nursing throughout the procedure under my direct supervision. CONTRAST:  60 cc Omnipaque 300 FLUOROSCOPY TIME:  10 minutes, 12 seconds (0,712 mGy) COMPLICATIONS: None immediate. PROCEDURE: Informed consent was obtained from the patient following explanation of the procedure, risks, benefits and alternatives. All questions were addressed. A time out was performed prior to the initiation of the procedure. Maximal barrier sterile technique utilized including caps, mask, sterile gowns, sterile gloves, large sterile drape, hand hygiene, and Betadine prep. The right femoral head was marked fluoroscopically. Under sterile conditions and local anesthesia, the right common femoral artery access was performed with a micropuncture needle. Under direct ultrasound guidance, the right common femoral was accessed with a micropuncture kit. An ultrasound image was saved for documentation purposes. This allowed for placement of a 5-French vascular sheath. A limited arteriogram was performed through the side arm of the sheath confirming appropriate access within the right common femoral artery. Over a Bentson wire, a Mickelson catheter was advanced the caudal aspect of the thoracic aorta where was reformed, back bled and flushed. The Mickelson catheter was then utilized to select the celiac artery and a selective celiac arteriogram was performed. Several additional selective celiac arteriograms were performed in various obliquities demonstrating the origin of the left renal artery. With the use of a fathom 16 microwire, a regular Renegade microcatheter was utilized to select the splenic artery and a selective splenic arteriogram was performed. The microcatheter was advanced to the level of the bifurcation of the splenic artery and the vessel was percutaneously coil embolized from this  location to its mid aspect with multiple overlapping 8 mm, 6 mm, 5 mm and 4 mm interlock coils. Multiple selective arteriograms were performed during the embolization. Ultimately, the microcatheter was retracted to the proximal-mid aspect of the splenic artery and a post embolization splenic arteriogram was performed. The microcatheter was removed and a completion celiac arteriogram was performed via the [facility] catheter. Images were reviewed and the procedure was terminated. All wires, catheters and sheaths were removed from the patient. Hemostasis was achieved at the right groin access site with deployment of an ExoSeal closure device and manual compression. The patient tolerated the procedure well without immediate post procedural complication. FINDINGS: Selective celiac arteriogram demonstrates a conventional branching pattern, though the main celiac trunk is deviated to the patient's right secondary to mass effect due to the splenic rupture and hematoma. There is mottled enhancement of the spleen without discrete area contrast extravasation or vessel irregularity. Following percutaneous embolization there is complete occlusion of the distal aspect of the splenic artery with minimal persistent splenic perfusion via collateral supply from the short gastrics. IMPRESSION: Technically successful percutaneous coil embolization of the splenic artery for splenic rupture.  PLAN: - The patient is to remain flat for 4 hours with right leg straight. - Continue resuscitation efforts as per the providing clinical team. Electronically Signed   By: Sandi Mariscal M.D.   On: 04/16/2021 07:34   IR Angiogram Selective Each Additional Vessel  Result Date: 04/16/2021 INDICATION: Recent ventral hernia repair as well as postoperative right trans gluteal approach percutaneous catheter placement now admitted with apparently spontaneous splenic rupture. Please perform mesenteric arteriogram and percutaneous embolization as indicated.  EXAM: 1. ULTRASOUND GUIDANCE FOR ARTERIAL ACCESS 2. SELECTIVE CELIAC ARTERIOGRAM 3. SELECTIVE SPLENIC ARTERIOGRAM AND PERCUTANEOUS COIL EMBOLIZATION COMPARISON:  CT abdomen and pelvis-earlier same day; 04/12/2021; 04/07/2021; CT guided right trans gluteal approach percutaneous drainage catheter placement-04/13/2021 MEDICATIONS: None ANESTHESIA/SEDATION: Moderate (conscious) sedation was employed during this procedure. A total of Fentanyl 100 mcg was administered intravenously. Moderate Sedation Time: 45 minutes. The patient's level of consciousness and vital signs were monitored continuously by radiology nursing throughout the procedure under my direct supervision. CONTRAST:  60 cc Omnipaque 300 FLUOROSCOPY TIME:  10 minutes, 12 seconds (2,951 mGy) COMPLICATIONS: None immediate. PROCEDURE: Informed consent was obtained from the patient following explanation of the procedure, risks, benefits and alternatives. All questions were addressed. A time out was performed prior to the initiation of the procedure. Maximal barrier sterile technique utilized including caps, mask, sterile gowns, sterile gloves, large sterile drape, hand hygiene, and Betadine prep. The right femoral head was marked fluoroscopically. Under sterile conditions and local anesthesia, the right common femoral artery access was performed with a micropuncture needle. Under direct ultrasound guidance, the right common femoral was accessed with a micropuncture kit. An ultrasound image was saved for documentation purposes. This allowed for placement of a 5-French vascular sheath. A limited arteriogram was performed through the side arm of the sheath confirming appropriate access within the right common femoral artery. Over a Bentson wire, a Mickelson catheter was advanced the caudal aspect of the thoracic aorta where was reformed, back bled and flushed. The Mickelson catheter was then utilized to select the celiac artery and a selective celiac arteriogram  was performed. Several additional selective celiac arteriograms were performed in various obliquities demonstrating the origin of the left renal artery. With the use of a fathom 16 microwire, a regular Renegade microcatheter was utilized to select the splenic artery and a selective splenic arteriogram was performed. The microcatheter was advanced to the level of the bifurcation of the splenic artery and the vessel was percutaneously coil embolized from this location to its mid aspect with multiple overlapping 8 mm, 6 mm, 5 mm and 4 mm interlock coils. Multiple selective arteriograms were performed during the embolization. Ultimately, the microcatheter was retracted to the proximal-mid aspect of the splenic artery and a post embolization splenic arteriogram was performed. The microcatheter was removed and a completion celiac arteriogram was performed via the [facility] catheter. Images were reviewed and the procedure was terminated. All wires, catheters and sheaths were removed from the patient. Hemostasis was achieved at the right groin access site with deployment of an ExoSeal closure device and manual compression. The patient tolerated the procedure well without immediate post procedural complication. FINDINGS: Selective celiac arteriogram demonstrates a conventional branching pattern, though the main celiac trunk is deviated to the patient's right secondary to mass effect due to the splenic rupture and hematoma. There is mottled enhancement of the spleen without discrete area contrast extravasation or vessel irregularity. Following percutaneous embolization there is complete occlusion of the distal aspect of the splenic artery with minimal  persistent splenic perfusion via collateral supply from the short gastrics. IMPRESSION: Technically successful percutaneous coil embolization of the splenic artery for splenic rupture. PLAN: - The patient is to remain flat for 4 hours with right leg straight. - Continue  resuscitation efforts as per the providing clinical team. Electronically Signed   By: Sandi Mariscal M.D.   On: 04/16/2021 07:34   IR US Guide Vasc Access Right  Result Date: 04/16/2021 INDICATION: Recent ventral hernia repair as well as postoperative right trans gluteal approach percutaneous catheter placement now admitted with apparently spontaneous splenic rupture. Please perform mesenteric arteriogram and percutaneous embolization as indicated. EXAM: 1. ULTRASOUND GUIDANCE FOR ARTERIAL ACCESS 2. SELECTIVE CELIAC ARTERIOGRAM 3. SELECTIVE SPLENIC ARTERIOGRAM AND PERCUTANEOUS COIL EMBOLIZATION COMPARISON:  CT abdomen and pelvis-earlier same day; 04/12/2021; 04/07/2021; CT guided right trans gluteal approach percutaneous drainage catheter placement-04/13/2021 MEDICATIONS: None ANESTHESIA/SEDATION: Moderate (conscious) sedation was employed during this procedure. A total of Fentanyl 100 mcg was administered intravenously. Moderate Sedation Time: 45 minutes. The patient's level of consciousness and vital signs were monitored continuously by radiology nursing throughout the procedure under my direct supervision. CONTRAST:  60 cc Omnipaque 300 FLUOROSCOPY TIME:  10 minutes, 12 seconds (5,329 mGy) COMPLICATIONS: None immediate. PROCEDURE: Informed consent was obtained from the patient following explanation of the procedure, risks, benefits and alternatives. All questions were addressed. A time out was performed prior to the initiation of the procedure. Maximal barrier sterile technique utilized including caps, mask, sterile gowns, sterile gloves, large sterile drape, hand hygiene, and Betadine prep. The right femoral head was marked fluoroscopically. Under sterile conditions and local anesthesia, the right common femoral artery access was performed with a micropuncture needle. Under direct ultrasound guidance, the right common femoral was accessed with a micropuncture kit. An ultrasound image was saved for documentation  purposes. This allowed for placement of a 5-French vascular sheath. A limited arteriogram was performed through the side arm of the sheath confirming appropriate access within the right common femoral artery. Over a Bentson wire, a Mickelson catheter was advanced the caudal aspect of the thoracic aorta where was reformed, back bled and flushed. The Mickelson catheter was then utilized to select the celiac artery and a selective celiac arteriogram was performed. Several additional selective celiac arteriograms were performed in various obliquities demonstrating the origin of the left renal artery. With the use of a fathom 16 microwire, a regular Renegade microcatheter was utilized to select the splenic artery and a selective splenic arteriogram was performed. The microcatheter was advanced to the level of the bifurcation of the splenic artery and the vessel was percutaneously coil embolized from this location to its mid aspect with multiple overlapping 8 mm, 6 mm, 5 mm and 4 mm interlock coils. Multiple selective arteriograms were performed during the embolization. Ultimately, the microcatheter was retracted to the proximal-mid aspect of the splenic artery and a post embolization splenic arteriogram was performed. The microcatheter was removed and a completion celiac arteriogram was performed via the [facility] catheter. Images were reviewed and the procedure was terminated. All wires, catheters and sheaths were removed from the patient. Hemostasis was achieved at the right groin access site with deployment of an ExoSeal closure device and manual compression. The patient tolerated the procedure well without immediate post procedural complication. FINDINGS: Selective celiac arteriogram demonstrates a conventional branching pattern, though the main celiac trunk is deviated to the patient's right secondary to mass effect due to the splenic rupture and hematoma. There is mottled enhancement of the spleen without discrete  area contrast extravasation or vessel irregularity. Following percutaneous embolization there is complete occlusion of the distal aspect of the splenic artery with minimal persistent splenic perfusion via collateral supply from the short gastrics. IMPRESSION: Technically successful percutaneous coil embolization of the splenic artery for splenic rupture. PLAN: - The patient is to remain flat for 4 hours with right leg straight. - Continue resuscitation efforts as per the providing clinical team. Electronically Signed   By: Sandi Mariscal M.D.   On: 04/16/2021 07:34   IR EMBO ART  VEN HEMORR LYMPH EXTRAV  INC GUIDE ROADMAPPING  Result Date: 04/16/2021 INDICATION: Recent ventral hernia repair as well as postoperative right trans gluteal approach percutaneous catheter placement now admitted with apparently spontaneous splenic rupture. Please perform mesenteric arteriogram and percutaneous embolization as indicated. EXAM: 1. ULTRASOUND GUIDANCE FOR ARTERIAL ACCESS 2. SELECTIVE CELIAC ARTERIOGRAM 3. SELECTIVE SPLENIC ARTERIOGRAM AND PERCUTANEOUS COIL EMBOLIZATION COMPARISON:  CT abdomen and pelvis-earlier same day; 04/12/2021; 04/07/2021; CT guided right trans gluteal approach percutaneous drainage catheter placement-04/13/2021 MEDICATIONS: None ANESTHESIA/SEDATION: Moderate (conscious) sedation was employed during this procedure. A total of Fentanyl 100 mcg was administered intravenously. Moderate Sedation Time: 45 minutes. The patient's level of consciousness and vital signs were monitored continuously by radiology nursing throughout the procedure under my direct supervision. CONTRAST:  60 cc Omnipaque 300 FLUOROSCOPY TIME:  10 minutes, 12 seconds (3,710 mGy) COMPLICATIONS: None immediate. PROCEDURE: Informed consent was obtained from the patient following explanation of the procedure, risks, benefits and alternatives. All questions were addressed. A time out was performed prior to the initiation of the procedure.  Maximal barrier sterile technique utilized including caps, mask, sterile gowns, sterile gloves, large sterile drape, hand hygiene, and Betadine prep. The right femoral head was marked fluoroscopically. Under sterile conditions and local anesthesia, the right common femoral artery access was performed with a micropuncture needle. Under direct ultrasound guidance, the right common femoral was accessed with a micropuncture kit. An ultrasound image was saved for documentation purposes. This allowed for placement of a 5-French vascular sheath. A limited arteriogram was performed through the side arm of the sheath confirming appropriate access within the right common femoral artery. Over a Bentson wire, a Mickelson catheter was advanced the caudal aspect of the thoracic aorta where was reformed, back bled and flushed. The Mickelson catheter was then utilized to select the celiac artery and a selective celiac arteriogram was performed. Several additional selective celiac arteriograms were performed in various obliquities demonstrating the origin of the left renal artery. With the use of a fathom 16 microwire, a regular Renegade microcatheter was utilized to select the splenic artery and a selective splenic arteriogram was performed. The microcatheter was advanced to the level of the bifurcation of the splenic artery and the vessel was percutaneously coil embolized from this location to its mid aspect with multiple overlapping 8 mm, 6 mm, 5 mm and 4 mm interlock coils. Multiple selective arteriograms were performed during the embolization. Ultimately, the microcatheter was retracted to the proximal-mid aspect of the splenic artery and a post embolization splenic arteriogram was performed. The microcatheter was removed and a completion celiac arteriogram was performed via the [facility] catheter. Images were reviewed and the procedure was terminated. All wires, catheters and sheaths were removed from the patient. Hemostasis  was achieved at the right groin access site with deployment of an ExoSeal closure device and manual compression. The patient tolerated the procedure well without immediate post procedural complication. FINDINGS: Selective celiac arteriogram demonstrates a conventional branching pattern, though the  main celiac trunk is deviated to the patient's right secondary to mass effect due to the splenic rupture and hematoma. There is mottled enhancement of the spleen without discrete area contrast extravasation or vessel irregularity. Following percutaneous embolization there is complete occlusion of the distal aspect of the splenic artery with minimal persistent splenic perfusion via collateral supply from the short gastrics. IMPRESSION: Technically successful percutaneous coil embolization of the splenic artery for splenic rupture. PLAN: - The patient is to remain flat for 4 hours with right leg straight. - Continue resuscitation efforts as per the providing clinical team. Electronically Signed   By: Sandi Mariscal M.D.   On: 04/16/2021 07:34    Medications / Allergies: per chart  Antibiotics: Anti-infectives (From admission, onward)    Start     Dose/Rate Route Frequency Ordered Stop   04/16/21 0100  piperacillin-tazobactam (ZOSYN) IVPB 3.375 g        3.375 g 12.5 mL/hr over 240 Minutes Intravenous  Once 04/16/21 0057 04/16/21 0143         Note: Portions of this report may have been transcribed using voice recognition software. Every effort was made to ensure accuracy; however, inadvertent computerized transcription errors may be present.   Any transcriptional errors that result from this process are unintentional.    Adin Hector, MD, FACS, MASCRS Esophageal, Gastrointestinal & Colorectal Surgery Robotic and Minimally Invasive Surgery  [facility] [address] [place], [place] [address] [phone] Fax [phone] Main/Paging  CONTACT INFORMATION: Weekday  (9AM-5PM) concerns: Call [facility] main office at [phone] Weeknight (5PM-9AM) or Weekend/Holiday concerns: Check [email or web address] for General Surgery [facility] coverage (Please, do not use SecureChat as it is not reliable communication to operating surgeons for immediate patient care)      04/16/2021  8:06 AM

## 2021-04-17 LAB — BASIC METABOLIC PANEL
Anion gap: 6 (ref 5–15)
BUN: 16 mg/dL (ref 8–23)
CO2: 25 mmol/L (ref 22–32)
Calcium: 7.9 mg/dL — ABNORMAL LOW (ref 8.9–10.3)
Chloride: 103 mmol/L (ref 98–111)
Creatinine, Ser: 0.83 mg/dL (ref 0.61–1.24)
GFR, Estimated: >60 mL/min (ref >60)
Glucose, Bld: 117 mg/dL — ABNORMAL HIGH (ref 70–99)
Potassium: 4.5 mmol/L (ref 3.5–5.1)
Sodium: 134 mmol/L — ABNORMAL LOW (ref 135–145)

## 2021-04-17 LAB — HEMOGLOBIN AND HEMATOCRIT, BLOOD
HCT: 21.1 % — ABNORMAL LOW (ref 39.0–52.0)
HCT: 25.3 % — ABNORMAL LOW (ref 39.0–52.0)
Hemoglobin: 6.7 g/dL — CL (ref 13.0–17.0)
Hemoglobin: 8.1 g/dL — ABNORMAL LOW (ref 13.0–17.0)

## 2021-04-17 LAB — CBC
HCT: 23.1 % — ABNORMAL LOW (ref 39.0–52.0)
Hemoglobin: 7.3 g/dL — ABNORMAL LOW (ref 13.0–17.0)
MCH: 27.1 pg (ref 26.0–34.0)
MCHC: 31.6 g/dL (ref 30.0–36.0)
MCV: 85.9 fL (ref 80.0–100.0)
Platelets: 446 10*3/uL — ABNORMAL HIGH (ref 150–400)
RBC: 2.69 MIL/uL — ABNORMAL LOW (ref 4.22–5.81)
RDW: 15.1 % (ref 11.5–15.5)
WBC: 16.8 10*3/uL — ABNORMAL HIGH (ref 4.0–10.5)
nRBC: 0 % (ref 0.0–0.2)

## 2021-04-17 LAB — GLUCOSE, CAPILLARY
Glucose-Capillary: 111 mg/dL — ABNORMAL HIGH (ref 70–99)
Glucose-Capillary: 106 mg/dL — ABNORMAL HIGH (ref 70–99)
Glucose-Capillary: 102 mg/dL — ABNORMAL HIGH (ref 70–99)
Glucose-Capillary: 112 mg/dL — ABNORMAL HIGH (ref 70–99)
Glucose-Capillary: 97 mg/dL (ref 70–99)

## 2021-04-17 LAB — PREPARE RBC (CROSSMATCH)

## 2021-04-17 MED ORDER — SODIUM CHLORIDE 0.9% FLUSH: 3.0000 mL | INTRAVENOUS | Status: DC | PRN

## 2021-04-17 MED ORDER — SODIUM CHLORIDE 0.9% IV SOLUTION: Freq: Once | INTRAVENOUS | Status: AC

## 2021-04-17 MED ORDER — FUROSEMIDE 10 MG/ML IJ SOLN: 20.0000 mg | Freq: Once | INTRAMUSCULAR | Status: DC

## 2021-04-17 MED ORDER — ENOXAPARIN SODIUM 40 MG/0.4ML IJ SOSY
40.0000 mg | PREFILLED_SYRINGE | INTRAMUSCULAR | Status: DC
  Administered 2021-04-18 – 2021-05-01 (×14): 40 mg via SUBCUTANEOUS
  Filled 2021-04-17 (×14): qty 0.4

## 2021-04-17 MED ORDER — ORAL CARE MOUTH RINSE
15.0000 mL | Freq: Two times a day (BID) | OROMUCOSAL | Status: DC
  Administered 2021-04-17 – 2021-05-01 (×23): 15 mL via OROMUCOSAL

## 2021-04-17 MED ORDER — POLYETHYLENE GLYCOL 3350 17 G PO PACK
17.0000 g | PACK | Freq: Every day | ORAL | Status: DC
  Administered 2021-04-17 – 2021-04-28 (×8): 17 g via ORAL
  Filled 2021-04-17 (×12): qty 1

## 2021-04-17 MED ORDER — FUROSEMIDE 10 MG/ML IJ SOLN
20.0000 mg | Freq: Once | INTRAMUSCULAR | Status: AC
  Administered 2021-04-17: 20 mg via INTRAVENOUS
  Filled 2021-04-17: qty 2

## 2021-04-17 MED ORDER — SODIUM CHLORIDE 0.9 % IV SOLN
250.0000 mL | INTRAVENOUS | Status: DC | PRN
  Administered 2021-04-17: 250 mL via INTRAVENOUS

## 2021-04-17 MED ORDER — GABAPENTIN 300 MG PO CAPS
300.0000 mg | ORAL_CAPSULE | Freq: Three times a day (TID) | ORAL | Status: DC
  Administered 2021-04-17 – 2021-05-01 (×42): 300 mg via ORAL
  Filled 2021-04-17 (×42): qty 1

## 2021-04-17 MED ORDER — SODIUM CHLORIDE 0.9% FLUSH
3.0000 mL | Freq: Two times a day (BID) | INTRAVENOUS | Status: DC
  Administered 2021-04-17 – 2021-04-20 (×7): 3 mL via INTRAVENOUS

## 2021-04-17 MED ORDER — FUROSEMIDE 10 MG/ML IJ SOLN: 40.0000 mg | Freq: Once | INTRAMUSCULAR | Status: DC

## 2021-04-17 NOTE — Progress Notes (Signed)
H/H reviewed, hgb 6.7. Given tachycardia and drop in hgb, will transfuse 1u PRBCs.

## 2021-04-17 NOTE — Progress Notes (Signed)
Brian Atkinson [id number] 1951-01-12  CARE TEAM:  PCP: Verdie Drown, Samuel Germany, MD  Outpatient Care Team: Patient Care Team: [facility], Samuel Germany, MD as PCP - General Stechschulte, Nickola Major, MD as Consulting Physician (Surgery)  Inpatient Treatment Team: Treatment Team: Attending Provider: Edison Pace Md, MD; Rounding Team: [facility], MD; Consulting Physician: Thermon Leyland, Nickola Major, MD; Technician: Antoine Primas; Technician: Eden Lathe, NT; Utilization Review: Alease Medina, RN; Registered Nurse: Lawana Chambers, RN   Problem List:   Principal Problem:   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 Eddie Dibbles Stechschulte (03/28/21) - REPAIR OF RECURRENT VENTRAL HERNIA WITH MESH, Bilateral rectus myofascial release, OPEN EXCISION OF INTRABDOMINAL MESH   Dr. Eddie Dibbles Stechschulte (04/04/21) - retromuscular recurrent incisional hernia repair with vicryl mesh, reconstruction of the posterior layer and Phasix absorbable retromuscular mesh, small bowel resection   Dr. Jacqulynn Cadet (04/13/21) - CT guided drain placement    Dr Sandi Mariscal (04/16/2021) - Post mesenteric arteriogram and percutaneous coil embolization of the splenic artery.       Assessment  Stabilizing after emergent embolization of spleen due to spontaneous rupture  [facility] Stay = 1 days)  Plan:  Mobilize to chair today.  Check orthostatics.  Maybe start ambulating tomorrow if better and hemoglobin stable  Keep in ICU next 48 hours to assure to stability and follow hemoglobins.  SCDs for now but hold off on full anticoagulation for the first 48 hours.  If hemoglobin above 7, can start enoxaparin tomorrow, 711  Anemia related to splenic rupture  hemoglobin up to 10 after transfusion.    Received IV iron.    Hemoglobin drifted down to the  7s but is ++ on I's and O's.  Blood pressure okay.  Some tachycardia but seems more pain related.    Rechecking hemoglobin this morning.   If less than 7, transfuse.    If greater than 7 then consider  1 dose of IV Lasix for diuresis to follow.    Leukocytosis most likely reactive & falling.  Follow.  Pelvic fluid collection drained.  Nothing specifically growing.  We will continue antibiotics for 5 more days.  We will give IV since p.o. status equivocal for now.  Ceftriaxone/Flagyl for now since he has no history of resistant organisms.  Follow-up on cultures from 7/6  Try liquids.  Hold off on aggressive diet for now given his distention and probable ileus.  Nausea control.  NG tube if worse.  Bowel regimen.  Pain control  Mobilize as tolerated to help recovery.  Try and get him up today.  Disposition:  Disposition:  The patient is from: Home  Anticipate discharge to:  Home  Anticipated Date of Discharge is:  January 14,2022    Barriers to discharge:  Pending Clinical improvement (more likely than not)  Patient currently is NOT MEDICALLY STABLE for discharge from the hospital from a surgery standpoint.      20 minutes spent in review, evaluation, examination, counseling, and coordination of care.   I have reviewed this patient's available data, including medical history, events of note, physical examination and test results as part of my evaluation.  A significant portion of that time was spent in counseling.  Care during the described time interval was provided by me.  04/17/2021    Subjective: (Chief complaint)  Patient tolerating liquids but feeling somewhat bloated.  No severe nausea or emesis.  No flatus.  Did not want to get out of bed.  Still with some pain.  Medicine helps.    Objective:  Vital signs:  Vitals:   04/17/21 0200 04/17/21 0300 04/17/21 0400 04/17/21 0700  BP:   136/71   Pulse: (!) 110 (!) 115 (!) 114 (!) 111  Resp: _0 Temp:    98.6 F (37 C)   TempSrc:   Oral   SpO2: 93% 93% 91% 90%  Weight:      Height:        Last BM Date: 04/14/21  Intake/Output   Yesterday:  07/09 0701 - 07/10 0700 In: 2035.9 [P.O.:1060; I.V.:455.6; IV Piggyback:520.3] Out: 1210 [Urine:1200; Drains:10] This shift:  No intake/output data recorded.  Bowel function:  Flatus: No  BM:  No  Drain: Serous   Physical Exam:  General: Pt awake/alert in mild acute distress Eyes: PERRL, normal EOM.  Sclera clear.  No icterus Neuro: CN II-XII intact w/o focal sensory/motor deficits. Lymph: No head/neck/groin lymphadenopathy Psych:  No delerium/psychosis/paranoia.  Oriented x 4 HENT: Normocephalic, Mucus membranes moist.  No thrush Neck: Supple, No tracheal deviation.  No obvious thyromegaly Chest: No pain to chest wall compression.  Good respiratory excursion.  No audible wheezing CV:  Pulses intact.  Regular rhythm.  No major extremity edema MS: Normal AROM mjr joints.  No obvious deformity  Abdomen: Soft.  Moderately distended.  Mildly tender at incisions only.  Incision clean dry and intact with staples in place.  No Penrose or other drain.  No drainage.  No hematoma or ecchymosis.  No evidence of peritonitis.  No incarcerated hernias.  Ext:   No deformity.  No mjr edema.  No cyanosis Skin: No petechiae / purpurea.  No major sores.  Warm and dry    Results:   Cultures: Recent Results (from the past 720 hour(s))  Surgical pcr screen     Status: None   Collection Time: 03/22/21  1:20 PM   Specimen: Nasal Mucosa; Nasal Swab  Result Value Ref Range Status   MRSA, PCR NEGATIVE NEGATIVE Final   Staphylococcus aureus NEGATIVE NEGATIVE Final    Comment: (NOTE) The Xpert SA Assay (FDA approved for NASAL specimens in patients 23 years of age and older), is one component of a comprehensive surveillance program. It is not intended to diagnose infection nor to guide or monitor treatment. Performed at [facility], [place] [address], [place], [place] [address]   SARS CORONAVIRUS 2 (TAT 6-24 HRS) Nasopharyngeal Nasopharyngeal Swab     Status: None   Collection Time: 03/24/21 10:43 AM   Specimen: Nasopharyngeal Swab  Result Value Ref Range Status   SARS Coronavirus 2 NEGATIVE NEGATIVE Final    Comment: (NOTE) SARS-CoV-2 target nucleic acids are NOT DETECTED.  The SARS-CoV-2 RNA is generally detectable in upper and lower respiratory specimens during the acute phase of infection. Negative results do not preclude SARS-CoV-2 infection, do not rule out co-infections with other pathogens, and should not be used as the sole basis for treatment or other patient management decisions. Negative results must be combined with clinical observations, patient history, and epidemiological information. The expected result is Negative.  Fact Sheet for Patients: SugarRoll.be  Fact Sheet for Healthcare Providers: https://www.woods-mathews.com/  This test is not yet approved or cleared by the [place] FDA and  has been authorized for detection and/or diagnosis of SARS-CoV-2 by  FDA under an Emergency Use Authorization (EUA). This EUA will remain  in effect (meaning this test can be used) for the duration of the COVID-19 declaration under Se ction 564(b)(1) of the Act, 21 U.S.C. section 360bbb-3(b)(1), unless the authorization is terminated or revoked sooner.  Performed at [facility], [place] [address], [place], [place] [address]   Resp Panel by RT-PCR (Flu A&B, Covid) Nasopharyngeal Swab     Status: None   Collection Time: 04/04/21 12:40 PM   Specimen: Nasopharyngeal Swab; Nasopharyngeal(NP) swabs in vial transport medium  Result Value Ref Range Status   SARS Coronavirus 2 by RT PCR NEGATIVE NEGATIVE Final    Comment: (NOTE) SARS-CoV-2 target nucleic acids are NOT DETECTED.  The SARS-CoV-2 RNA is generally detectable in upper respiratory specimens  during the acute phase of infection. The lowest concentration of SARS-CoV-2 viral copies this assay can detect is 138 copies/mL. A negative result does not preclude SARS-Cov-2 infection and should not be used as the sole basis for treatment or other patient management decisions. A negative result may occur with  improper specimen collection/handling, submission of specimen other than nasopharyngeal swab, presence of viral mutation(s) within the areas targeted by this assay, and inadequate number of viral copies(<138 copies/mL). A negative result must be combined with clinical observations, patient history, and epidemiological information. The expected result is Negative.  Fact Sheet for Patients:  EntrepreneurPulse.com.au  Fact Sheet for Healthcare Providers:  IncredibleEmployment.be  This test is no t yet approved or cleared by the [place] FDA and  has been authorized for detection and/or diagnosis of SARS-CoV-2 by FDA under an Emergency Use Authorization (EUA). This EUA will remain  in effect (meaning this test can be used) for the duration of the COVID-19 declaration under Section 564(b)(1) of the Act, 21 U.S.C.section 360bbb-3(b)(1), unless the authorization is terminated  or revoked sooner.       Influenza A by PCR NEGATIVE NEGATIVE Final   Influenza B by PCR NEGATIVE NEGATIVE Final    Comment: (NOTE) The Xpert Xpress SARS-CoV-2/FLU/RSV plus assay is intended as an aid in the diagnosis of influenza from Nasopharyngeal swab specimens and should not be used as a sole basis for treatment. Nasal washings and aspirates are unacceptable for Xpert Xpress SARS-CoV-2/FLU/RSV testing.  Fact Sheet for Patients: EntrepreneurPulse.com.au  Fact Sheet for Healthcare Providers: IncredibleEmployment.be  This test is not yet approved or cleared by the [place] FDA and has been authorized for detection  and/or diagnosis of SARS-CoV-2 by FDA under an Emergency Use Authorization (EUA). This EUA will remain in effect (meaning this test can be used) for the duration of the COVID-19 declaration under Section 564(b)(1) of the Act, 21 U.S.C. section 360bbb-3(b)(1), unless the authorization is terminated or revoked.  Performed at [facility], [place] [address] [place], [place] [address]   Aerobic/Anaerobic Culture w Gram Stain (surgical/deep wound)     Status: None (Preliminary result)   Collection Time: 04/13/21  2:06 PM   Specimen: Abscess  Result Value Ref Range Status   Specimen Description   Final    ABSCESS Performed at [place] [address], [place], [place] [address]    Special Requests   Final    NONE Performed at [facility], [place] [address], [place], [place] [address]    Gram Stain   Final    ABUNDANT WBC PRESENT, PREDOMINANTLY PMN NO ORGANISMS SEEN    Culture   Final    NO  GROWTH 3 DAYS NO ANAEROBES ISOLATED; CULTURE IN PROGRESS FOR 5 DAYS Performed at [facility], [place] [address] [place], [place] [address]    Report Status PENDING  Incomplete  Resp Panel by RT-PCR (Flu A&B, Covid) Nasopharyngeal Swab     Status: None   Collection Time: 04/16/21  1:29 AM   Specimen: Nasopharyngeal Swab; Nasopharyngeal(NP) swabs in vial transport medium  Result Value Ref Range Status   SARS Coronavirus 2 by RT PCR NEGATIVE NEGATIVE Final    Comment: (NOTE) SARS-CoV-2 target nucleic acids are NOT DETECTED.  The SARS-CoV-2 RNA is generally detectable in upper respiratory specimens during the acute phase of infection. The lowest concentration of SARS-CoV-2 viral copies this assay can detect is 138 copies/mL. A negative result does not preclude SARS-Cov-2 infection and should not be used as the sole basis for treatment or other patient management decisions. A negative result may occur with  improper specimen  collection/handling, submission of specimen other than nasopharyngeal swab, presence of viral mutation(s) within the areas targeted by this assay, and inadequate number of viral copies(<138 copies/mL). A negative result must be combined with clinical observations, patient history, and epidemiological information. The expected result is Negative.  Fact Sheet for Patients:  EntrepreneurPulse.com.au  Fact Sheet for Healthcare Providers:  IncredibleEmployment.be  This test is no t yet approved or cleared by the [place] FDA and  has been authorized for detection and/or diagnosis of SARS-CoV-2 by FDA under an Emergency Use Authorization (EUA). This EUA will remain  in effect (meaning this test can be used) for the duration of the COVID-19 declaration under Section 564(b)(1) of the Act, 21 U.S.C.section 360bbb-3(b)(1), unless the authorization is terminated  or revoked sooner.       Influenza A by PCR NEGATIVE NEGATIVE Final   Influenza B by PCR NEGATIVE NEGATIVE Final    Comment: (NOTE) The Xpert Xpress SARS-CoV-2/FLU/RSV plus assay is intended as an aid in the diagnosis of influenza from Nasopharyngeal swab specimens and should not be used as a sole basis for treatment. Nasal washings and aspirates are unacceptable for Xpert Xpress SARS-CoV-2/FLU/RSV testing.  Fact Sheet for Patients: EntrepreneurPulse.com.au  Fact Sheet for Healthcare Providers: IncredibleEmployment.be  This test is not yet approved or cleared by the [place] FDA and has been authorized for detection and/or diagnosis of SARS-CoV-2 by FDA under an Emergency Use Authorization (EUA). This EUA will remain in effect (meaning this test can be used) for the duration of the COVID-19 declaration under Section 564(b)(1) of the Act, 21 U.S.C. section 360bbb-3(b)(1), unless the authorization is terminated or revoked.  Performed at [facility], [place] [address] [place], [place] [address]   MRSA Next Gen by PCR, Nasal     Status: None   Collection Time: 04/16/21  2:37 AM   Specimen: Nasal Mucosa; Nasal Swab  Result Value Ref Range Status   MRSA by PCR Next Gen NOT DETECTED NOT DETECTED Final    Comment: (NOTE) The GeneXpert MRSA Assay (FDA approved for NASAL specimens only), is one component of a comprehensive MRSA colonization surveillance program. It is not intended to diagnose MRSA infection nor to guide or monitor treatment for MRSA infections. Test performance is not FDA approved in patients less than 101 years old. Performed at [facility], [place] [address] [place], [place] [address]     Labs: Results for orders placed or performed during the hospital encounter of 04/15/21 (from the past 48 hour(s))  CBC with Differential  Status: Abnormal   Collection Time: 04/15/21 11:43 PM  Result Value Ref Range   WBC 31.2 (H) 4.0 - 10.5 K/uL   RBC 4.07 (L) 4.22 - 5.81 MIL/uL   Hemoglobin 10.9 (L) 13.0 - 17.0 g/dL   HCT 35.8 (L) 39.0 - 52.0 %   MCV 88.0 80.0 - 100.0 fL   MCH 26.8 26.0 - 34.0 pg   MCHC 30.4 30.0 - 36.0 g/dL   RDW 15.4 11.5 - 15.5 %   Platelets 785 (H) 150 - 400 K/uL   nRBC 0.0 0.0 - 0.2 %   Neutrophils Relative % 91 %   Band Neutrophils 1 %   Lymphocytes Relative 3 %   Monocytes Relative 4 %   Eosinophils Relative 0 %   Basophils Relative 0 %   WBC Morphology NORMAL    RBC Morphology NORMAL    Smear Review DONE    nRBC NONE SEEN 0 /100 WBC   Metamyelocytes Relative NONE SEEN %   Myelocytes 1 %   Promyelocytes Relative NONE SEEN %   Blasts NONE SEEN %    Comment: Performed at [facility], [place] [address] [place], [place] [address]  Comprehensive metabolic panel     Status: Abnormal   Collection Time: 04/15/21 11:43 PM  Result Value Ref Range   Sodium 135 135 - 145 mmol/L   Potassium 3.9 3.5 - 5.1 mmol/L   Chloride 102 98 - 111  mmol/L   CO2 21 (L) 22 - 32 mmol/L   Glucose, Bld 189 (H) 70 - 99 mg/dL    Comment: Glucose reference range applies only to samples taken after fasting for at least 8 hours.   BUN 19 8 - 23 mg/dL   Creatinine, Ser 1.71 (H) 0.61 - 1.24 mg/dL   Calcium 8.8 (L) 8.9 - 10.3 mg/dL   Total Protein 7.7 6.5 - 8.1 g/dL   Albumin 3.2 (L) 3.5 - 5.0 g/dL   AST 25 15 - 41 U/L   ALT 26 0 - 44 U/L   Alkaline Phosphatase 78 38 - 126 U/L   Total Bilirubin 0.4 0.3 - 1.2 mg/dL   GFR, Estimated 43 (L) >60 mL/min    Comment: (NOTE) Calculated using the CKD-EPI Creatinine Equation (2021)    Anion gap 12 5 - 15    Comment: Performed at [facility], [place] [address], [place], [place] [address]  Lipase, blood     Status: None   Collection Time: 04/15/21 11:43 PM  Result Value Ref Range   Lipase 41 11 - 51 U/L    Comment: Performed at [facility], [place] [address], [place], [place] [address]  Troponin I (High Sensitivity)     Status: None   Collection Time: 04/15/21 11:43 PM  Result Value Ref Range   Troponin I (High Sensitivity) 3 <18 ng/L    Comment: (NOTE) Elevated high sensitivity troponin I (hsTnI) values and significant  changes across serial measurements may suggest ACS but many other  chronic and acute conditions are known to elevate hsTnI results.  Refer to the "Links" section for chest pain algorithms and additional  guidance. Performed at [facility], [place] [address], [place], [place] [address]   Lactic acid, plasma     Status: Abnormal   Collection Time: 04/15/21 11:44 PM  Result Value Ref Range   Lactic Acid, Venous 5.0 (HH) 0.5 - 1.9 mmol/L    Comment: CRITICAL RESULT CALLED TO, READ BACK BY  AND VERIFIED WITH: BANNO,A RN _0  ON 04/16/21 JACKSON,K Performed at [facility], [place] [address] [place], [place] [address]   Type and screen [place]     Status: None (Preliminary result)    Collection Time: 04/15/21 11:44 PM  Result Value Ref Range   ABO/RH(D) O POS    Antibody Screen NEG    Sample Expiration 04/18/2021,2359    Unit Number [id number]    Blood Component Type RED CELLS,LR    Unit division 00    Status of Unit ALLOCATED    Transfusion Status OK TO TRANSFUSE    Crossmatch Result Compatible    Unit Number [id number]    Blood Component Type RED CELLS,LR    Unit division 00    Status of Unit ISSUED,FINAL    Transfusion Status OK TO TRANSFUSE    Crossmatch Result      Compatible Performed at [facility], [place] [address], [place], [place] [address]   Resp Panel by RT-PCR (Flu A&B, Covid) Nasopharyngeal Swab     Status: None   Collection Time: 04/16/21  1:29 AM   Specimen: Nasopharyngeal Swab; Nasopharyngeal(NP) swabs in vial transport medium  Result Value Ref Range   SARS Coronavirus 2 by RT PCR NEGATIVE NEGATIVE    Comment: (NOTE) SARS-CoV-2 target nucleic acids are NOT DETECTED.  The SARS-CoV-2 RNA is generally detectable in upper respiratory specimens during the acute phase of infection. The lowest concentration of SARS-CoV-2 viral copies this assay can detect is 138 copies/mL. A negative result does not preclude SARS-Cov-2 infection and should not be used as the sole basis for treatment or other patient management decisions. A negative result may occur with  improper specimen collection/handling, submission of specimen other than nasopharyngeal swab, presence of viral mutation(s) within the areas targeted by this assay, and inadequate number of viral copies(<138 copies/mL). A negative result must be combined with clinical observations, patient history, and epidemiological information. The expected result is Negative.  Fact Sheet for Patients:  EntrepreneurPulse.com.au  Fact Sheet for Healthcare Providers:  IncredibleEmployment.be  This test is no t yet approved or cleared by the [place] FDA and  has been authorized for detection and/or diagnosis of SARS-CoV-2 by FDA under an Emergency Use Authorization (EUA). This EUA will remain  in effect (meaning this test can be used) for the duration of the COVID-19 declaration under Section 564(b)(1) of the Act, 21 U.S.C.section 360bbb-3(b)(1), unless the authorization is terminated  or revoked sooner.       Influenza A by PCR NEGATIVE NEGATIVE   Influenza B by PCR NEGATIVE NEGATIVE    Comment: (NOTE) The Xpert Xpress SARS-CoV-2/FLU/RSV plus assay is intended as an aid in the diagnosis of influenza from Nasopharyngeal swab specimens and should not be used as a sole basis for treatment. Nasal washings and aspirates are unacceptable for Xpert Xpress SARS-CoV-2/FLU/RSV testing.  Fact Sheet for Patients: EntrepreneurPulse.com.au  Fact Sheet for Healthcare Providers: IncredibleEmployment.be  This test is not yet approved or cleared by the [place] FDA and has been authorized for detection and/or diagnosis of SARS-CoV-2 by FDA under an Emergency Use Authorization (EUA). This EUA will remain in effect (meaning this test can be used) for the duration of the COVID-19 declaration under Section 564(b)(1) of the Act, 21 U.S.C. section 360bbb-3(b)(1), unless the authorization is terminated or revoked.  Performed at [facility], [place] [address] [place], [place] [address]   Lactic acid, plasma  Status: Abnormal   Collection Time: 04/16/21  1:44 AM  Result Value Ref Range   Lactic Acid, Venous 2.8 (HH) 0.5 - 1.9 mmol/L    Comment: CRITICAL VALUE NOTED.  VALUE IS CONSISTENT WITH PREVIOUSLY REPORTED AND CALLED VALUE. Performed at [facility], [place] [address] [place], [place] [address]   Prepare RBC (crossmatch)     Status: None   Collection Time: 04/16/21  1:57 AM  Result Value Ref Range   Order Confirmation      ORDER PROCESSED BY BLOOD  BANK Performed at [place] [address], [place], [place] [address]   MRSA Next Gen by PCR, Nasal     Status: None   Collection Time: 04/16/21  2:37 AM   Specimen: Nasal Mucosa; Nasal Swab  Result Value Ref Range   MRSA by PCR Next Gen NOT DETECTED NOT DETECTED    Comment: (NOTE) The GeneXpert MRSA Assay (FDA approved for NASAL specimens only), is one component of a comprehensive MRSA colonization surveillance program. It is not intended to diagnose MRSA infection nor to guide or monitor treatment for MRSA infections. Test performance is not FDA approved in patients less than 73 years old. Performed at [facility], [place] [address], [place], [place] [address]   Troponin I (High Sensitivity)     Status: None   Collection Time: 04/16/21  2:40 AM  Result Value Ref Range   Troponin I (High Sensitivity) 3 <18 ng/L    Comment: (NOTE) Elevated high sensitivity troponin I (hsTnI) values and significant  changes across serial measurements may suggest ACS but many other  chronic and acute conditions are known to elevate hsTnI results.  Refer to the "Links" section for chest pain algorithms and additional  guidance. Performed at [facility], [place] [address], [place], [place] [address]   Basic metabolic panel     Status: Abnormal   Collection Time: 04/16/21  2:54 AM  Result Value Ref Range   Sodium 136 135 - 145 mmol/L   Potassium 5.1 3.5 - 5.1 mmol/L    Comment: DELTA CHECK NOTED NO VISIBLE HEMOLYSIS    Chloride 105 98 - 111 mmol/L   CO2 22 22 - 32 mmol/L   Glucose, Bld 169 (H) 70 - 99 mg/dL    Comment: Glucose reference range applies only to samples taken after fasting for at least 8 hours.   BUN 21 8 - 23 mg/dL   Creatinine, Ser 1.23 0.61 - 1.24 mg/dL   Calcium 8.0 (L) 8.9 - 10.3 mg/dL   GFR, Estimated >60 >60 mL/min    Comment: (NOTE) Calculated using the CKD-EPI Creatinine Equation (2021)    Anion gap 9 5 - 15     Comment: Performed at [facility], [place] [address] [place], [place] [address]  CBC     Status: Abnormal   Collection Time: 04/16/21  2:54 AM  Result Value Ref Range   WBC 31.2 (H) 4.0 - 10.5 K/uL   RBC 4.04 (L) 4.22 - 5.81 MIL/uL   Hemoglobin 10.9 (L) 13.0 - 17.0 g/dL   HCT 36.4 (L) 39.0 - 52.0 %   MCV 90.1 80.0 - 100.0 fL   MCH 27.0 26.0 - 34.0 pg   MCHC 29.9 (L) 30.0 - 36.0 g/dL   RDW 15.4 11.5 - 15.5 %   Platelets 807 (H) 150 - 400 K/uL    Comment: Performed at [facility], [place] [address] [place],  [place] [address]  Glucose, capillary     Status: Abnormal   Collection Time: 04/16/21  7:30 AM  Result Value Ref Range   Glucose-Capillary 140 (H) 70 - 99 mg/dL    Comment: Glucose reference range applies only to samples taken after fasting for at least 8 hours.  Hemoglobin and hematocrit, blood     Status: Abnormal   Collection Time: 04/16/21  7:58 AM  Result Value Ref Range   Hemoglobin 9.4 (L) 13.0 - 17.0 g/dL   HCT 29.5 (L) 39.0 - 52.0 %    Comment: Performed at [facility], [place] [address], [place], [place] [address]  Glucose, capillary     Status: Abnormal   Collection Time: 04/16/21 12:21 PM  Result Value Ref Range   Glucose-Capillary 134 (H) 70 - 99 mg/dL    Comment: Glucose reference range applies only to samples taken after fasting for at least 8 hours.  Glucose, capillary     Status: Abnormal   Collection Time: 04/16/21  4:00 PM  Result Value Ref Range   Glucose-Capillary 115 (H) 70 - 99 mg/dL    Comment: Glucose reference range applies only to samples taken after fasting for at least 8 hours.  Glucose, capillary     Status: Abnormal   Collection Time: 04/16/21  7:41 PM  Result Value Ref Range   Glucose-Capillary 107 (H) 70 - 99 mg/dL    Comment: Glucose reference range applies only to samples taken after fasting for at least 8 hours.   Comment 1 Notify RN    Comment 2 Document in Chart   Glucose, capillary      Status: Abnormal   Collection Time: 04/16/21 11:32 PM  Result Value Ref Range   Glucose-Capillary 129 (H) 70 - 99 mg/dL    Comment: Glucose reference range applies only to samples taken after fasting for at least 8 hours.   Comment 1 Notify RN    Comment 2 Document in Chart   Basic metabolic panel     Status: Abnormal   Collection Time: 04/17/21  2:16 AM  Result Value Ref Range   Sodium 134 (L) 135 - 145 mmol/L   Potassium 4.5 3.5 - 5.1 mmol/L   Chloride 103 98 - 111 mmol/L   CO2 25 22 - 32 mmol/L   Glucose, Bld 117 (H) 70 - 99 mg/dL    Comment: Glucose reference range applies only to samples taken after fasting for at least 8 hours.   BUN 16 8 - 23 mg/dL   Creatinine, Ser 0.83 0.61 - 1.24 mg/dL   Calcium 7.9 (L) 8.9 - 10.3 mg/dL   GFR, Estimated >60 >60 mL/min    Comment: (NOTE) Calculated using the CKD-EPI Creatinine Equation (2021)    Anion gap 6 5 - 15    Comment: Performed at [facility], [place] [address] [place], [place] [address]  CBC     Status: Abnormal   Collection Time: 04/17/21  2:16 AM  Result Value Ref Range   WBC 16.8 (H) 4.0 - 10.5 K/uL   RBC 2.69 (L) 4.22 - 5.81 MIL/uL   Hemoglobin 7.3 (L) 13.0 - 17.0 g/dL   HCT 23.1 (L) 39.0 - 52.0 %   MCV 85.9 80.0 - 100.0 fL   MCH 27.1 26.0 - 34.0 pg   MCHC 31.6 30.0 - 36.0 g/dL   RDW 15.1 11.5 - 15.5 %   Platelets 446 (H) 150 - 400 K/uL   nRBC 0.0 0.0 -  0.2 %    Comment: Performed at [facility], [place] [address] [place], [place] [address]  Glucose, capillary     Status: Abnormal   Collection Time: 04/17/21  4:00 AM  Result Value Ref Range   Glucose-Capillary 111 (H) 70 - 99 mg/dL    Comment: Glucose reference range applies only to samples taken after fasting for at least 8 hours.   Comment 1 Notify RN    Comment 2 Document in Chart   Glucose, capillary     Status: Abnormal   Collection Time: 04/17/21  7:48 AM  Result Value Ref Range   Glucose-Capillary 106 (H) 70 - 99  mg/dL    Comment: Glucose reference range applies only to samples taken after fasting for at least 8 hours.    Imaging / Studies: CT ABDOMEN PELVIS WO CONTRAST  Result Date: 04/16/2021 CLINICAL DATA:  Abdominal pain and fever postop recent hernia repair and abscess drainage. Syncope, dizziness and weakness which began at 1700 hours. Hernia surgery 04/04/2021. Drain removal at approximately 1300 hours. Pale, diaphoretic, tachycardic and hypotensive. EXAM: CT ABDOMEN AND PELVIS WITHOUT CONTRAST TECHNIQUE: Multidetector CT imaging of the abdomen and pelvis was performed following the standard protocol without IV contrast. COMPARISON:  CT 04/12/2021 FINDINGS: Lower chest: Atelectatic changes in the lung bases. Trace left effusion. Cardiac size within normal limits. No pericardial effusion. Coronary artery calcifications are present. Hepatobiliary: Few stable fluid attenuation cysts are similar to comparison prior. Additional small subcentimeter hypoattenuating foci in the liver too small to fully characterize on CT imaging but statistically likely benign. No concerning focal liver lesions. Smooth liver surface contour. Normal hepatic attenuation. Gallbladder is moderately distended. Gradient attenuation material within the gallbladder may reflect some vicarious extravasation of contrast media from prior exam versus biliary sludge. Bridging and cap morphology of the gallbladder fundus. No significant biliary ductal dilatation or visible calcified gallstones. Pancreas: No pancreatic ductal dilatation or surrounding inflammatory changes. Spleen: Heterogeneous expansile appearance of the spleen with extensive subcapsular hemorrhage and hemorrhage extending into the peritoneum as well, new from comparison study 04/12/2021. Regions of gradient layering attenuation within the splenic parenchyma may reflect intraparenchymal hemorrhage as well. Difficult to fully assess in the absence of contrast media. Adrenals/Urinary  Tract: No adrenal hemorrhage or suspicious adrenal lesion. No visible or contour deforming renal lesion. No urolithiasis or hydronephrosis. Urinary bladder is largely decompressed at the time of exam and therefore poorly evaluated by CT imaging. No Ras Kollman bladder abnormality accounting for underdistention. Stomach/Bowel: Sliding-type hiatal hernia. Distal stomach and duodenum are unremarkable with normal sweep across the midline abdomen. Multiple fluid filled increasingly distended loops of distal small bowel are noted which continue to the level of the anastomosis in the right lower quadrant. Colonic segments are relatively unremarkable accounting for varying degrees of distension. No significant colonic thickening or dilatation. Vascular/Lymphatic: Atherosclerotic calcifications within the abdominal aorta and branch vessels. No aneurysm or ectasia. No enlarged abdominopelvic lymph nodes. Reproductive: The prostate and seminal vesicles are unremarkable. Other: Small to moderate volume hemoperitoneum with additional fluid tracking in the pelvis. A a transgluteal pigtail drainage catheter is seen in the rectovesicular pouch with minimal fluid around the tip of the strange catheter. No new organized collections are seen. No bowel containing hernia is evident. Some persistent low-attenuation lentiform collection is seen along the anterior abdominal wall which could reflect a postoperative hematoma or seroma with removal of the previously seen surgical drain at this level. Collection measures up to 9 mm in maximal  thickness and extends approximately 13 cm in craniocaudal by 12 cm in transverse dimension. Musculoskeletal: Multilevel degenerative changes are present in the imaged portions of the spine. No acute osseous abnormality or suspicious osseous lesion. IMPRESSION: 1. Features of a spontaneous splenic rupture with mixed age hemorrhagic products both in a subcapsular and intraperitoneal distribution with small to  moderate volume hemoperitoneum, new from comparison prior 04/12/2021. 2. Some increasing distension of distal small bowel proximal to the anastomotic line from small bowel resection in the right lower quadrant concerning for a developing obstruction or stricture at the anastomotic site. 3. Persistent thin lentiform hypodense fluid collection along the anterior abdomen at the site of recent hernia repair post removal of the previously seen drainage catheter at this level. 4. A right pigtail transgluteal drainage catheter is noted position within the rectovesicular pouch with only minimal residual fluid about the drainage catheter tip. 5. Trace left pleural effusion. These results were called by telephone at the time of interpretation on 04/16/2021 at 1:56 am to provider [facility] , who verbally acknowledged these results. Electronically Signed   By: Lovena Le M.D.   On: 04/16/2021 01:56   IR Angiogram Visceral Selective  Result Date: 04/16/2021 INDICATION: Recent ventral hernia repair as well as postoperative right trans gluteal approach percutaneous catheter placement now admitted with apparently spontaneous splenic rupture. Please perform mesenteric arteriogram and percutaneous embolization as indicated. EXAM: 1. ULTRASOUND GUIDANCE FOR ARTERIAL ACCESS 2. SELECTIVE CELIAC ARTERIOGRAM 3. SELECTIVE SPLENIC ARTERIOGRAM AND PERCUTANEOUS COIL EMBOLIZATION COMPARISON:  CT abdomen and pelvis-earlier same day; 04/12/2021; 04/07/2021; CT guided right trans gluteal approach percutaneous drainage catheter placement-04/13/2021 MEDICATIONS: None ANESTHESIA/SEDATION: Moderate (conscious) sedation was employed during this procedure. A total of Fentanyl 100 mcg was administered intravenously. Moderate Sedation Time: 45 minutes. The patient's level of consciousness and vital signs were monitored continuously by radiology nursing throughout the procedure under my direct supervision. CONTRAST:  60 cc Omnipaque 300 FLUOROSCOPY  TIME:  10 minutes, 12 seconds (9,509 mGy) COMPLICATIONS: None immediate. PROCEDURE: Informed consent was obtained from the patient following explanation of the procedure, risks, benefits and alternatives. All questions were addressed. A time out was performed prior to the initiation of the procedure. Maximal barrier sterile technique utilized including caps, mask, sterile gowns, sterile gloves, large sterile drape, hand hygiene, and Betadine prep. The right femoral head was marked fluoroscopically. Under sterile conditions and local anesthesia, the right common femoral artery access was performed with a micropuncture needle. Under direct ultrasound guidance, the right common femoral was accessed with a micropuncture kit. An ultrasound image was saved for documentation purposes. This allowed for placement of a 5-French vascular sheath. A limited arteriogram was performed through the side arm of the sheath confirming appropriate access within the right common femoral artery. Over a Bentson wire, a Mickelson catheter was advanced the caudal aspect of the thoracic aorta where was reformed, back bled and flushed. The Mickelson catheter was then utilized to select the celiac artery and a selective celiac arteriogram was performed. Several additional selective celiac arteriograms were performed in various obliquities demonstrating the origin of the left renal artery. With the use of a fathom 16 microwire, a regular Renegade microcatheter was utilized to select the splenic artery and a selective splenic arteriogram was performed. The microcatheter was advanced to the level of the bifurcation of the splenic artery and the vessel was percutaneously coil embolized from this location to its mid aspect with multiple overlapping 8 mm, 6 mm, 5 mm and 4 mm interlock  coils. Multiple selective arteriograms were performed during the embolization. Ultimately, the microcatheter was retracted to the proximal-mid aspect of the splenic  artery and a post embolization splenic arteriogram was performed. The microcatheter was removed and a completion celiac arteriogram was performed via the [facility] catheter. Images were reviewed and the procedure was terminated. All wires, catheters and sheaths were removed from the patient. Hemostasis was achieved at the right groin access site with deployment of an ExoSeal closure device and manual compression. The patient tolerated the procedure well without immediate post procedural complication. FINDINGS: Selective celiac arteriogram demonstrates a conventional branching pattern, though the main celiac trunk is deviated to the patient's right secondary to mass effect due to the splenic rupture and hematoma. There is mottled enhancement of the spleen without discrete area contrast extravasation or vessel irregularity. Following percutaneous embolization there is complete occlusion of the distal aspect of the splenic artery with minimal persistent splenic perfusion via collateral supply from the short gastrics. IMPRESSION: Technically successful percutaneous coil embolization of the splenic artery for splenic rupture. PLAN: - The patient is to remain flat for 4 hours with right leg straight. - Continue resuscitation efforts as per the providing clinical team. Electronically Signed   By: Sandi Mariscal M.D.   On: 04/16/2021 07:34   IR Angiogram Selective Each Additional Vessel  Result Date: 04/16/2021 INDICATION: Recent ventral hernia repair as well as postoperative right trans gluteal approach percutaneous catheter placement now admitted with apparently spontaneous splenic rupture. Please perform mesenteric arteriogram and percutaneous embolization as indicated. EXAM: 1. ULTRASOUND GUIDANCE FOR ARTERIAL ACCESS 2. SELECTIVE CELIAC ARTERIOGRAM 3. SELECTIVE SPLENIC ARTERIOGRAM AND PERCUTANEOUS COIL EMBOLIZATION COMPARISON:  CT abdomen and pelvis-earlier same day; 04/12/2021; 04/07/2021; CT guided right trans gluteal  approach percutaneous drainage catheter placement-04/13/2021 MEDICATIONS: None ANESTHESIA/SEDATION: Moderate (conscious) sedation was employed during this procedure. A total of Fentanyl 100 mcg was administered intravenously. Moderate Sedation Time: 45 minutes. The patient's level of consciousness and vital signs were monitored continuously by radiology nursing throughout the procedure under my direct supervision. CONTRAST:  60 cc Omnipaque 300 FLUOROSCOPY TIME:  10 minutes, 12 seconds (2,683 mGy) COMPLICATIONS: None immediate. PROCEDURE: Informed consent was obtained from the patient following explanation of the procedure, risks, benefits and alternatives. All questions were addressed. A time out was performed prior to the initiation of the procedure. Maximal barrier sterile technique utilized including caps, mask, sterile gowns, sterile gloves, large sterile drape, hand hygiene, and Betadine prep. The right femoral head was marked fluoroscopically. Under sterile conditions and local anesthesia, the right common femoral artery access was performed with a micropuncture needle. Under direct ultrasound guidance, the right common femoral was accessed with a micropuncture kit. An ultrasound image was saved for documentation purposes. This allowed for placement of a 5-French vascular sheath. A limited arteriogram was performed through the side arm of the sheath confirming appropriate access within the right common femoral artery. Over a Bentson wire, a Mickelson catheter was advanced the caudal aspect of the thoracic aorta where was reformed, back bled and flushed. The Mickelson catheter was then utilized to select the celiac artery and a selective celiac arteriogram was performed. Several additional selective celiac arteriograms were performed in various obliquities demonstrating the origin of the left renal artery. With the use of a fathom 16 microwire, a regular Renegade microcatheter was utilized to select the  splenic artery and a selective splenic arteriogram was performed. The microcatheter was advanced to the level of the bifurcation of the splenic artery and the vessel  was percutaneously coil embolized from this location to its mid aspect with multiple overlapping 8 mm, 6 mm, 5 mm and 4 mm interlock coils. Multiple selective arteriograms were performed during the embolization. Ultimately, the microcatheter was retracted to the proximal-mid aspect of the splenic artery and a post embolization splenic arteriogram was performed. The microcatheter was removed and a completion celiac arteriogram was performed via the [facility] catheter. Images were reviewed and the procedure was terminated. All wires, catheters and sheaths were removed from the patient. Hemostasis was achieved at the right groin access site with deployment of an ExoSeal closure device and manual compression. The patient tolerated the procedure well without immediate post procedural complication. FINDINGS: Selective celiac arteriogram demonstrates a conventional branching pattern, though the main celiac trunk is deviated to the patient's right secondary to mass effect due to the splenic rupture and hematoma. There is mottled enhancement of the spleen without discrete area contrast extravasation or vessel irregularity. Following percutaneous embolization there is complete occlusion of the distal aspect of the splenic artery with minimal persistent splenic perfusion via collateral supply from the short gastrics. IMPRESSION: Technically successful percutaneous coil embolization of the splenic artery for splenic rupture. PLAN: - The patient is to remain flat for 4 hours with right leg straight. - Continue resuscitation efforts as per the providing clinical team. Electronically Signed   By: Sandi Mariscal M.D.   On: 04/16/2021 07:34   IR US Guide Vasc Access Right  Result Date: 04/16/2021 INDICATION: Recent ventral hernia repair as well as postoperative right  trans gluteal approach percutaneous catheter placement now admitted with apparently spontaneous splenic rupture. Please perform mesenteric arteriogram and percutaneous embolization as indicated. EXAM: 1. ULTRASOUND GUIDANCE FOR ARTERIAL ACCESS 2. SELECTIVE CELIAC ARTERIOGRAM 3. SELECTIVE SPLENIC ARTERIOGRAM AND PERCUTANEOUS COIL EMBOLIZATION COMPARISON:  CT abdomen and pelvis-earlier same day; 04/12/2021; 04/07/2021; CT guided right trans gluteal approach percutaneous drainage catheter placement-04/13/2021 MEDICATIONS: None ANESTHESIA/SEDATION: Moderate (conscious) sedation was employed during this procedure. A total of Fentanyl 100 mcg was administered intravenously. Moderate Sedation Time: 45 minutes. The patient's level of consciousness and vital signs were monitored continuously by radiology nursing throughout the procedure under my direct supervision. CONTRAST:  60 cc Omnipaque 300 FLUOROSCOPY TIME:  10 minutes, 12 seconds (4,235 mGy) COMPLICATIONS: None immediate. PROCEDURE: Informed consent was obtained from the patient following explanation of the procedure, risks, benefits and alternatives. All questions were addressed. A time out was performed prior to the initiation of the procedure. Maximal barrier sterile technique utilized including caps, mask, sterile gowns, sterile gloves, large sterile drape, hand hygiene, and Betadine prep. The right femoral head was marked fluoroscopically. Under sterile conditions and local anesthesia, the right common femoral artery access was performed with a micropuncture needle. Under direct ultrasound guidance, the right common femoral was accessed with a micropuncture kit. An ultrasound image was saved for documentation purposes. This allowed for placement of a 5-French vascular sheath. A limited arteriogram was performed through the side arm of the sheath confirming appropriate access within the right common femoral artery. Over a Bentson wire, a Mickelson catheter was  advanced the caudal aspect of the thoracic aorta where was reformed, back bled and flushed. The Mickelson catheter was then utilized to select the celiac artery and a selective celiac arteriogram was performed. Several additional selective celiac arteriograms were performed in various obliquities demonstrating the origin of the left renal artery. With the use of a fathom 16 microwire, a regular Renegade microcatheter was utilized to select the splenic artery and  a selective splenic arteriogram was performed. The microcatheter was advanced to the level of the bifurcation of the splenic artery and the vessel was percutaneously coil embolized from this location to its mid aspect with multiple overlapping 8 mm, 6 mm, 5 mm and 4 mm interlock coils. Multiple selective arteriograms were performed during the embolization. Ultimately, the microcatheter was retracted to the proximal-mid aspect of the splenic artery and a post embolization splenic arteriogram was performed. The microcatheter was removed and a completion celiac arteriogram was performed via the [facility] catheter. Images were reviewed and the procedure was terminated. All wires, catheters and sheaths were removed from the patient. Hemostasis was achieved at the right groin access site with deployment of an ExoSeal closure device and manual compression. The patient tolerated the procedure well without immediate post procedural complication. FINDINGS: Selective celiac arteriogram demonstrates a conventional branching pattern, though the main celiac trunk is deviated to the patient's right secondary to mass effect due to the splenic rupture and hematoma. There is mottled enhancement of the spleen without discrete area contrast extravasation or vessel irregularity. Following percutaneous embolization there is complete occlusion of the distal aspect of the splenic artery with minimal persistent splenic perfusion via collateral supply from the short gastrics.  IMPRESSION: Technically successful percutaneous coil embolization of the splenic artery for splenic rupture. PLAN: - The patient is to remain flat for 4 hours with right leg straight. - Continue resuscitation efforts as per the providing clinical team. Electronically Signed   By: Sandi Mariscal M.D.   On: 04/16/2021 07:34   IR EMBO ART  VEN HEMORR LYMPH EXTRAV  INC GUIDE ROADMAPPING  Result Date: 04/16/2021 INDICATION: Recent ventral hernia repair as well as postoperative right trans gluteal approach percutaneous catheter placement now admitted with apparently spontaneous splenic rupture. Please perform mesenteric arteriogram and percutaneous embolization as indicated. EXAM: 1. ULTRASOUND GUIDANCE FOR ARTERIAL ACCESS 2. SELECTIVE CELIAC ARTERIOGRAM 3. SELECTIVE SPLENIC ARTERIOGRAM AND PERCUTANEOUS COIL EMBOLIZATION COMPARISON:  CT abdomen and pelvis-earlier same day; 04/12/2021; 04/07/2021; CT guided right trans gluteal approach percutaneous drainage catheter placement-04/13/2021 MEDICATIONS: None ANESTHESIA/SEDATION: Moderate (conscious) sedation was employed during this procedure. A total of Fentanyl 100 mcg was administered intravenously. Moderate Sedation Time: 45 minutes. The patient's level of consciousness and vital signs were monitored continuously by radiology nursing throughout the procedure under my direct supervision. CONTRAST:  60 cc Omnipaque 300 FLUOROSCOPY TIME:  10 minutes, 12 seconds (6,286 mGy) COMPLICATIONS: None immediate. PROCEDURE: Informed consent was obtained from the patient following explanation of the procedure, risks, benefits and alternatives. All questions were addressed. A time out was performed prior to the initiation of the procedure. Maximal barrier sterile technique utilized including caps, mask, sterile gowns, sterile gloves, large sterile drape, hand hygiene, and Betadine prep. The right femoral head was marked fluoroscopically. Under sterile conditions and local anesthesia, the  right common femoral artery access was performed with a micropuncture needle. Under direct ultrasound guidance, the right common femoral was accessed with a micropuncture kit. An ultrasound image was saved for documentation purposes. This allowed for placement of a 5-French vascular sheath. A limited arteriogram was performed through the side arm of the sheath confirming appropriate access within the right common femoral artery. Over a Bentson wire, a Mickelson catheter was advanced the caudal aspect of the thoracic aorta where was reformed, back bled and flushed. The Mickelson catheter was then utilized to select the celiac artery and a selective celiac arteriogram was performed. Several additional selective celiac arteriograms were performed in various  obliquities demonstrating the origin of the left renal artery. With the use of a fathom 16 microwire, a regular Renegade microcatheter was utilized to select the splenic artery and a selective splenic arteriogram was performed. The microcatheter was advanced to the level of the bifurcation of the splenic artery and the vessel was percutaneously coil embolized from this location to its mid aspect with multiple overlapping 8 mm, 6 mm, 5 mm and 4 mm interlock coils. Multiple selective arteriograms were performed during the embolization. Ultimately, the microcatheter was retracted to the proximal-mid aspect of the splenic artery and a post embolization splenic arteriogram was performed. The microcatheter was removed and a completion celiac arteriogram was performed via the [facility] catheter. Images were reviewed and the procedure was terminated. All wires, catheters and sheaths were removed from the patient. Hemostasis was achieved at the right groin access site with deployment of an ExoSeal closure device and manual compression. The patient tolerated the procedure well without immediate post procedural complication. FINDINGS: Selective celiac arteriogram demonstrates  a conventional branching pattern, though the main celiac trunk is deviated to the patient's right secondary to mass effect due to the splenic rupture and hematoma. There is mottled enhancement of the spleen without discrete area contrast extravasation or vessel irregularity. Following percutaneous embolization there is complete occlusion of the distal aspect of the splenic artery with minimal persistent splenic perfusion via collateral supply from the short gastrics. IMPRESSION: Technically successful percutaneous coil embolization of the splenic artery for splenic rupture. PLAN: - The patient is to remain flat for 4 hours with right leg straight. - Continue resuscitation efforts as per the providing clinical team. Electronically Signed   By: Sandi Mariscal M.D.   On: 04/16/2021 07:34    Medications / Allergies: per chart  Antibiotics: Anti-infectives (From admission, onward)    Start     Dose/Rate Route Frequency Ordered Stop   04/16/21 1800  piperacillin-tazobactam (ZOSYN) IVPB 3.375 g       Note to Pharmacy: Pharmacy may adjust dosing strength, schedule, rate of infusion, etc as needed to optimize therapy   3.375 g 12.5 mL/hr over 240 Minutes Intravenous Every 8 hours 04/16/21 1640 04/20/21 1759   04/16/21 1730  piperacillin-tazobactam (ZOSYN) IVPB 3.375 g  Status:  Discontinued       Note to Pharmacy: Pharmacy may adjust dosing strength, schedule, rate of infusion, etc as needed to optimize therapy   3.375 g 12.5 mL/hr over 240 Minutes Intravenous Every 8 hours 04/16/21 1640 04/16/21 1640   04/16/21 1000  cefTRIAXone (ROCEPHIN) 2 g in sodium chloride 0.9 % 100 mL IVPB  Status:  Discontinued       Note to Pharmacy: Pharmacy may adjust dosing strength / duration / interval for maximal efficacy   2 g 200 mL/hr over 30 Minutes Intravenous Every 24 hours 04/16/21 0828 04/16/21 1640   04/16/21 1000  metroNIDAZOLE (FLAGYL) IVPB 500 mg  Status:  Discontinued        500 mg 100 mL/hr over 60 Minutes  Intravenous Every 6 hours 04/16/21 0828 04/16/21 1640   04/16/21 0100  piperacillin-tazobactam (ZOSYN) IVPB 3.375 g        3.375 g 12.5 mL/hr over 240 Minutes Intravenous  Once 04/16/21 0057 04/16/21 0143         Note: Portions of this report may have been transcribed using voice recognition software. Every effort was made to ensure accuracy; however, inadvertent computerized transcription errors may be present.   Any transcriptional errors that result from this  process are unintentional.    Adin Hector, MD, FACS, MASCRS Esophageal, Gastrointestinal & Colorectal Surgery Robotic and Minimally Invasive Surgery  [facility] [address] [place], [place] [address] [phone] Fax [phone] Main/Paging  CONTACT INFORMATION: Weekday (9AM-5PM) concerns: Call [facility] main office at [phone] Weeknight (5PM-9AM) or Weekend/Holiday concerns: Check [email or web address] for General Surgery [facility] coverage (Please, do not use SecureChat as it is not reliable communication to operating surgeons for immediate patient care)      04/17/2021  8:02 AM

## 2021-04-18 LAB — BASIC METABOLIC PANEL
Anion gap: 6 (ref 5–15)
BUN: 9 mg/dL (ref 8–23)
CO2: 27 mmol/L (ref 22–32)
Calcium: 8 mg/dL — ABNORMAL LOW (ref 8.9–10.3)
Chloride: 101 mmol/L (ref 98–111)
Creatinine, Ser: 0.87 mg/dL (ref 0.61–1.24)
GFR, Estimated: >60 mL/min (ref >60)
Glucose, Bld: 106 mg/dL — ABNORMAL HIGH (ref 70–99)
Potassium: 3.8 mmol/L (ref 3.5–5.1)
Sodium: 134 mmol/L — ABNORMAL LOW (ref 135–145)

## 2021-04-18 LAB — GLUCOSE, CAPILLARY
Glucose-Capillary: 102 mg/dL — ABNORMAL HIGH (ref 70–99)
Glucose-Capillary: 105 mg/dL — ABNORMAL HIGH (ref 70–99)
Glucose-Capillary: 109 mg/dL — ABNORMAL HIGH (ref 70–99)
Glucose-Capillary: 110 mg/dL — ABNORMAL HIGH (ref 70–99)
Glucose-Capillary: 111 mg/dL — ABNORMAL HIGH (ref 70–99)
Glucose-Capillary: 118 mg/dL — ABNORMAL HIGH (ref 70–99)
Glucose-Capillary: 126 mg/dL — ABNORMAL HIGH (ref 70–99)
Glucose-Capillary: 84 mg/dL (ref 70–99)

## 2021-04-18 LAB — TYPE AND SCREEN
ABO/RH(D): O POS
Antibody Screen: NEGATIVE
Unit division: 0
Unit division: 0

## 2021-04-18 LAB — HEMOGLOBIN AND HEMATOCRIT, BLOOD
HCT: 22.6 % — ABNORMAL LOW (ref 39.0–52.0)
Hemoglobin: 7.3 g/dL — ABNORMAL LOW (ref 13.0–17.0)

## 2021-04-18 LAB — AEROBIC/ANAEROBIC CULTURE W GRAM STAIN (SURGICAL/DEEP WOUND): Culture: NO GROWTH

## 2021-04-18 LAB — CBC
HCT: 23 % — ABNORMAL LOW (ref 39.0–52.0)
Hemoglobin: 7.4 g/dL — ABNORMAL LOW (ref 13.0–17.0)
MCH: 28.2 pg (ref 26.0–34.0)
MCHC: 32.2 g/dL (ref 30.0–36.0)
MCV: 87.8 fL (ref 80.0–100.0)
Platelets: 445 10*3/uL — ABNORMAL HIGH (ref 150–400)
RBC: 2.62 MIL/uL — ABNORMAL LOW (ref 4.22–5.81)
RDW: 14.9 % (ref 11.5–15.5)
WBC: 18.5 10*3/uL — ABNORMAL HIGH (ref 4.0–10.5)
nRBC: 0 % (ref 0.0–0.2)

## 2021-04-18 LAB — BPAM RBC
Blood Product Expiration Date: 202208112359
Blood Product Expiration Date: 202208112359
ISSUE DATE / TIME: 202207090251
ISSUE DATE / TIME: 202207101235
Unit Type and Rh: 5100
Unit Type and Rh: 5100

## 2021-04-18 LAB — APTT: aPTT: 34 seconds (ref 24–36)

## 2021-04-18 LAB — PROTIME-INR
INR: 1.3 — ABNORMAL HIGH (ref 0.8–1.2)
Prothrombin Time: 16.4 seconds — ABNORMAL HIGH (ref 11.4–15.2)

## 2021-04-18 MED ORDER — ENSURE ENLIVE PO LIQD
237.0000 mL | Freq: Two times a day (BID) | ORAL | Status: DC
  Administered 2021-04-18 – 2021-04-20 (×3): 237 mL via ORAL

## 2021-04-18 NOTE — Progress Notes (Signed)
Progress Note: General Surgery Service   Chief Complaint/Subjective: Feeling slowly better.  Tolerating liquid diet.  No bowel movements.  Has an appetite.  Objective: Vital signs in last 24 hours: Temp:  [97.9 F (36.6 C)-99.3 F (37.4 C)] 98.5 F (36.9 C) (07/11 0800) Pulse Rate:  [88-117] 97 (07/11 0800) Resp:  [10-27] 17 (07/11 0800) BP: (86-145)/(41-94) 122/83 (07/11 0800) SpO2:  [90 %-99 %] 93 % (07/11 0800) Last BM Date: 04/14/21  Intake/Output from previous day: 07/10 0701 - 07/11 0700 In: 1656 [P.O.:240; I.V.:778.3; Blood:390; IV Piggyback:239.7] Out: 1251 [Urine:1250; Drains:1] Intake/Output this shift: Total I/O In: 116 [I.V.:16; IV Piggyback:100] Out: 2 [Drains:2]  GI: Abd Distended, incision c/d/I w/ staples healing, JP drain out of buttock with no drainage   Lab Results: CBC  Recent Labs    04/17/21 0216 04/17/21 1030 04/17/21 1854 04/18/21 0317  WBC 16.8*  --   --  18.5*  HGB 7.3*   < > 8.1* 7.4*  HCT 23.1*   < > 25.3* 23.0*  PLT 446*  --   --  445*   < > = values in this interval not displayed.   BMET Recent Labs    04/17/21 0216 04/18/21 0317  NA 134* 134*  K 4.5 3.8  CL 103 101  CO2 25 27  GLUCOSE 117* 106*  BUN 16 9  CREATININE 0.83 0.87  CALCIUM 7.9* 8.0*   PT/INR Recent Labs    04/18/21 0711  LABPROT 16.4*  INR 1.3*   ABG No results for input(s): PHART, HCO3 in the last 72 hours.  Invalid input(s): PCO2, PO2  Anti-infectives: Anti-infectives (From admission, onward)    Start     Dose/Rate Route Frequency Ordered Stop   04/16/21 1800  piperacillin-tazobactam (ZOSYN) IVPB 3.375 g       Note to Pharmacy: Pharmacy may adjust dosing strength, schedule, rate of infusion, etc as needed to optimize therapy   3.375 g 12.5 mL/hr over 240 Minutes Intravenous Every 8 hours 04/16/21 1640 04/20/21 1759   04/16/21 1730  piperacillin-tazobactam (ZOSYN) IVPB 3.375 g  Status:  Discontinued       Note to Pharmacy: Pharmacy may adjust  dosing strength, schedule, rate of infusion, etc as needed to optimize therapy   3.375 g 12.5 mL/hr over 240 Minutes Intravenous Every 8 hours 04/16/21 1640 04/16/21 1640   04/16/21 1000  cefTRIAXone (ROCEPHIN) 2 g in sodium chloride 0.9 % 100 mL IVPB  Status:  Discontinued       Note to Pharmacy: Pharmacy may adjust dosing strength / duration / interval for maximal efficacy   2 g 200 mL/hr over 30 Minutes Intravenous Every 24 hours 04/16/21 0828 04/16/21 1640   04/16/21 1000  metroNIDAZOLE (FLAGYL) IVPB 500 mg  Status:  Discontinued        500 mg 100 mL/hr over 60 Minutes Intravenous Every 6 hours 04/16/21 0828 04/16/21 1640   04/16/21 0100  piperacillin-tazobactam (ZOSYN) IVPB 3.375 g        3.375 g 12.5 mL/hr over 240 Minutes Intravenous  Once 04/16/21 0057 04/16/21 0143       Medications: Scheduled Meds:  acetaminophen  1,000 mg Oral Q6H   Chlorhexidine Gluconate Cloth  6 each Topical Q0600   enoxaparin (LOVENOX) injection  40 mg Subcutaneous Q24H   gabapentin  300 mg Oral TID   insulin aspart  0-9 Units Subcutaneous Q4H   lip balm  1 application Topical BID   mouth rinse  15 mL Mouth Rinse BID  pantoprazole  40 mg Oral BID   polycarbophil  625 mg Oral BID   polyethylene glycol  17 g Oral Daily   sodium chloride flush  3 mL Intravenous Q12H   sodium chloride flush  3 mL Intravenous Q12H   Continuous Infusions:  sodium chloride     sodium chloride 10 mL/hr at 04/18/21 0744   ondansetron (ZOFRAN) IV     piperacillin-tazobactam (ZOSYN)  IV Stopped (04/18/21 0621)   PRN Meds:.sodium chloride, sodium chloride, alum & mag hydroxide-simeth, bisacodyl, diphenhydrAMINE, HYDROmorphone (DILAUDID) injection, magic mouthwash, menthol-cetylpyridinium, ondansetron (ZOFRAN) IV **OR** ondansetron (ZOFRAN) IV, ondansetron **OR** [DISCONTINUED] ondansetron (ZOFRAN) IV, oxyCODONE, phenol, prochlorperazine, simethicone, sodium chloride flush, sodium chloride flush  Assessment/Plan: Mr. Kant  is a 70 year old male who underwent recurrent incisional hernia repair with removal of previous intraperitoneal mesh and rives stoppa style repair.  His posterior layer broke down where the previous mesh had been removed requiring takeback to the operating room for diagnostic laparotomy converted to exploratory laparotomy, small bowel resection with vicryl mesh reconstruction of the posterior layer and phasix retromuscular mesh placement on 04/04/2021. He recovered from this in the hospital, with a prolonged postoperative ileus which was not unexpected due to the large interparietal hernia.  A CT guided drain was placed on 04/13/21, to drain a pelvic fluid collection.  He was discharged on 04/15/21.  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  On 04/16/21, he underwent coil embolization of the splenic artery.  He is now recovering in the step-down unit with improving tachycardia and stabilizing hemoglobin.  Advance diet Discontinue IVF Discuss removal of IR drain as output has decreased and fluid collection improved on CT Continue to monitor hemoglobin and tachycardia in stepdown today Hold off on heparin ppx today, INR 1.3 on check today    LOS: 2 days   FEN: Reg ID: None VTE: SCDs, hold off on medical ppx Foley: None Dispo: Continued care in stepdown    Quentin Ore, MD  [facility] Use [email or web address] to contact on call provider

## 2021-04-18 NOTE — Plan of Care (Signed)

## 2021-04-18 NOTE — Progress Notes (Signed)
Referring Physician(s): Stechschulte,P  Supervising Physician: Simonne Come  Patient Status:  [facility] - In-pt  Chief Complaint:  Left upper abdominal pain  Subjective: Patient familiar to IR service from recent pelvic fluid collection drainage on 7/6 followed by percutaneous coil embolization of splenic artery for spontaneous splenic rupture on 7/9 ;complaining of intermittent left upper quadrant discomfort, worse with deep breathing; denies fever, headache, chest pain, nausea, vomiting, back pain or visible bleeding; BP okay, remains tachycardic   Allergies: Versed [midazolam]  Medications: Prior to Admission medications   Medication Sig Start Date End Date Taking? Authorizing Provider  acetaminophen (TYLENOL) 500 MG tablet Take 2 tablets (1,000 mg total) by mouth every 6 (six) hours as needed for mild pain or fever. 04/15/21   Juliet Rude, PA-C  amoxicillin-clavulanate (AUGMENTIN) 875-125 MG tablet Take 1 tablet by mouth every 12 (twelve) hours for 7 days. 04/15/21 04/22/21  Juliet Rude, PA-C  cetirizine (ZYRTEC) 10 MG tablet Take 10 mg by mouth daily as needed for allergies.    [provider]  docusate sodium (COLACE) 100 MG capsule Take 1 capsule (100 mg total) by mouth 2 (two) times daily. 04/15/21   Juliet Rude, PA-C  gabapentin (NEURONTIN) 300 MG capsule Take 1 capsule (300 mg total) by mouth 3 (three) times daily. 04/15/21   Juliet Rude, PA-C  Multiple Vitamins-Minerals (ONE-A-DAY MENS 50+) TABS Take 1 tablet by mouth daily.    [provider]  oxyCODONE (OXY IR/ROXICODONE) 5 MG immediate release tablet Take 1 tablet (5 mg total) by mouth every 4 (four) hours as needed for moderate pain. 04/15/21   Juliet Rude, PA-C  pantoprazole (PROTONIX) 40 MG tablet Take 1 tablet (40 mg total) by mouth 2 (two) times daily. 04/15/21   Juliet Rude, PA-C  polyethylene glycol (MIRALAX / GLYCOLAX) 17 g packet Take 17 g by mouth daily. 04/15/21   Juliet Rude,  PA-C  sodium chloride flush (NS) 0.9 % SOLN 5 mLs by Intracatheter route daily. 04/15/21   Juliet Rude, PA-C  enoxaparin (LOVENOX) 40 MG/0.4ML injection Inject 0.4 mLs (40 mg total) into the skin daily. 07/28/20 08/16/20  Montez Morita, PA-C  ferrous sulfate 325 (65 FE) MG tablet Take 1 tablet (325 mg total) by mouth 3 (three) times daily with meals. Patient not taking: Reported on 12/09/2020 07/28/20 12/17/20  Montez Morita, PA-C  rivaroxaban (XARELTO) 10 MG TABS tablet TAKE 1 TABLET (10 MG TOTAL) BY MOUTH DAILY. Patient not taking: No sig reported 12/17/20 04/15/21  Montez Morita, PA-C     Vital Signs: BP 136/74   Pulse (!) 114   Temp 98.9 F (37.2 C) (Oral)   Resp (!) 22   Ht 5' 11.5" (1.816 m)   Wt 229 lb 0.9 oz (103.9 kg)   SpO2 91%   BMI 31.50 kg/m   Physical Exam awake, alert.  Right transgluteal drain intact, minimal output yellow fluid, insertion site mildly tender, right common femoral artery access site soft, clean, dry, nontender, no hematoma; intact distal pulses  Imaging: CT ABDOMEN PELVIS WO CONTRAST  Result Date: 04/16/2021 CLINICAL DATA:  Abdominal pain and fever postop recent hernia repair and abscess drainage. Syncope, dizziness and weakness which began at 1700 hours. Hernia surgery 04/04/2021. Drain removal at approximately 1300 hours. Pale, diaphoretic, tachycardic and hypotensive. EXAM: CT ABDOMEN AND PELVIS WITHOUT CONTRAST TECHNIQUE: Multidetector CT imaging of the abdomen and pelvis was performed following the standard protocol without IV contrast. COMPARISON:  CT 04/12/2021  FINDINGS: Lower chest: Atelectatic changes in the lung bases. Trace left effusion. Cardiac size within normal limits. No pericardial effusion. Coronary artery calcifications are present. Hepatobiliary: Few stable fluid attenuation cysts are similar to comparison prior. Additional small subcentimeter hypoattenuating foci in the liver too small to fully characterize on CT imaging but statistically likely  benign. No concerning focal liver lesions. Smooth liver surface contour. Normal hepatic attenuation. Gallbladder is moderately distended. Gradient attenuation material within the gallbladder may reflect some vicarious extravasation of contrast media from prior exam versus biliary sludge. Bridging and cap morphology of the gallbladder fundus. No significant biliary ductal dilatation or visible calcified gallstones. Pancreas: No pancreatic ductal dilatation or surrounding inflammatory changes. Spleen: Heterogeneous expansile appearance of the spleen with extensive subcapsular hemorrhage and hemorrhage extending into the peritoneum as well, new from comparison study 04/12/2021. Regions of gradient layering attenuation within the splenic parenchyma may reflect intraparenchymal hemorrhage as well. Difficult to fully assess in the absence of contrast media. Adrenals/Urinary Tract: No adrenal hemorrhage or suspicious adrenal lesion. No visible or contour deforming renal lesion. No urolithiasis or hydronephrosis. Urinary bladder is largely decompressed at the time of exam and therefore poorly evaluated by CT imaging. No gross bladder abnormality accounting for underdistention. Stomach/Bowel: Sliding-type hiatal hernia. Distal stomach and duodenum are unremarkable with normal sweep across the midline abdomen. Multiple fluid filled increasingly distended loops of distal small bowel are noted which continue to the level of the anastomosis in the right lower quadrant. Colonic segments are relatively unremarkable accounting for varying degrees of distension. No significant colonic thickening or dilatation. Vascular/Lymphatic: Atherosclerotic calcifications within the abdominal aorta and branch vessels. No aneurysm or ectasia. No enlarged abdominopelvic lymph nodes. Reproductive: The prostate and seminal vesicles are unremarkable. Other: Small to moderate volume hemoperitoneum with additional fluid tracking in the pelvis. A a  transgluteal pigtail drainage catheter is seen in the rectovesicular pouch with minimal fluid around the tip of the strange catheter. No new organized collections are seen. No bowel containing hernia is evident. Some persistent low-attenuation lentiform collection is seen along the anterior abdominal wall which could reflect a postoperative hematoma or seroma with removal of the previously seen surgical drain at this level. Collection measures up to 9 mm in maximal thickness and extends approximately 13 cm in craniocaudal by 12 cm in transverse dimension. Musculoskeletal: Multilevel degenerative changes are present in the imaged portions of the spine. No acute osseous abnormality or suspicious osseous lesion. IMPRESSION: 1. Features of a spontaneous splenic rupture with mixed age hemorrhagic products both in a subcapsular and intraperitoneal distribution with small to moderate volume hemoperitoneum, new from comparison prior 04/12/2021. 2. Some increasing distension of distal small bowel proximal to the anastomotic line from small bowel resection in the right lower quadrant concerning for a developing obstruction or stricture at the anastomotic site. 3. Persistent thin lentiform hypodense fluid collection along the anterior abdomen at the site of recent hernia repair post removal of the previously seen drainage catheter at this level. 4. A right pigtail transgluteal drainage catheter is noted position within the rectovesicular pouch with only minimal residual fluid about the drainage catheter tip. 5. Trace left pleural effusion. These results were called by telephone at the time of interpretation on 04/16/2021 at 1:56 am to provider [facility] , who verbally acknowledged these results. Electronically Signed   By: Lovena Le M.D.   On: 04/16/2021 01:56   IR Angiogram Visceral Selective  Result Date: 04/16/2021 INDICATION: Recent ventral hernia repair as well as  postoperative right trans gluteal approach  percutaneous catheter placement now admitted with apparently spontaneous splenic rupture. Please perform mesenteric arteriogram and percutaneous embolization as indicated. EXAM: 1. ULTRASOUND GUIDANCE FOR ARTERIAL ACCESS 2. SELECTIVE CELIAC ARTERIOGRAM 3. SELECTIVE SPLENIC ARTERIOGRAM AND PERCUTANEOUS COIL EMBOLIZATION COMPARISON:  CT abdomen and pelvis-earlier same day; 04/12/2021; 04/07/2021; CT guided right trans gluteal approach percutaneous drainage catheter placement-04/13/2021 MEDICATIONS: None ANESTHESIA/SEDATION: Moderate (conscious) sedation was employed during this procedure. A total of Fentanyl 100 mcg was administered intravenously. Moderate Sedation Time: 45 minutes. The patient's level of consciousness and vital signs were monitored continuously by radiology nursing throughout the procedure under my direct supervision. CONTRAST:  60 cc Omnipaque 300 FLUOROSCOPY TIME:  10 minutes, 12 seconds (7,672 mGy) COMPLICATIONS: None immediate. PROCEDURE: Informed consent was obtained from the patient following explanation of the procedure, risks, benefits and alternatives. All questions were addressed. A time out was performed prior to the initiation of the procedure. Maximal barrier sterile technique utilized including caps, mask, sterile gowns, sterile gloves, large sterile drape, hand hygiene, and Betadine prep. The right femoral head was marked fluoroscopically. Under sterile conditions and local anesthesia, the right common femoral artery access was performed with a micropuncture needle. Under direct ultrasound guidance, the right common femoral was accessed with a micropuncture kit. An ultrasound image was saved for documentation purposes. This allowed for placement of a 5-French vascular sheath. A limited arteriogram was performed through the side arm of the sheath confirming appropriate access within the right common femoral artery. Over a Bentson wire, a Mickelson catheter was advanced the caudal aspect  of the thoracic aorta where was reformed, back bled and flushed. The Mickelson catheter was then utilized to select the celiac artery and a selective celiac arteriogram was performed. Several additional selective celiac arteriograms were performed in various obliquities demonstrating the origin of the left renal artery. With the use of a fathom 16 microwire, a regular Renegade microcatheter was utilized to select the splenic artery and a selective splenic arteriogram was performed. The microcatheter was advanced to the level of the bifurcation of the splenic artery and the vessel was percutaneously coil embolized from this location to its mid aspect with multiple overlapping 8 mm, 6 mm, 5 mm and 4 mm interlock coils. Multiple selective arteriograms were performed during the embolization. Ultimately, the microcatheter was retracted to the proximal-mid aspect of the splenic artery and a post embolization splenic arteriogram was performed. The microcatheter was removed and a completion celiac arteriogram was performed via the [facility] catheter. Images were reviewed and the procedure was terminated. All wires, catheters and sheaths were removed from the patient. Hemostasis was achieved at the right groin access site with deployment of an ExoSeal closure device and manual compression. The patient tolerated the procedure well without immediate post procedural complication. FINDINGS: Selective celiac arteriogram demonstrates a conventional branching pattern, though the main celiac trunk is deviated to the patient's right secondary to mass effect due to the splenic rupture and hematoma. There is mottled enhancement of the spleen without discrete area contrast extravasation or vessel irregularity. Following percutaneous embolization there is complete occlusion of the distal aspect of the splenic artery with minimal persistent splenic perfusion via collateral supply from the short gastrics. IMPRESSION: Technically successful  percutaneous coil embolization of the splenic artery for splenic rupture. PLAN: - The patient is to remain flat for 4 hours with right leg straight. - Continue resuscitation efforts as per the providing clinical team. Electronically Signed   By: Eldridge Abrahams.D.  On: 04/16/2021 07:34   IR Angiogram Selective Each Additional Vessel  Result Date: 04/16/2021 INDICATION: Recent ventral hernia repair as well as postoperative right trans gluteal approach percutaneous catheter placement now admitted with apparently spontaneous splenic rupture. Please perform mesenteric arteriogram and percutaneous embolization as indicated. EXAM: 1. ULTRASOUND GUIDANCE FOR ARTERIAL ACCESS 2. SELECTIVE CELIAC ARTERIOGRAM 3. SELECTIVE SPLENIC ARTERIOGRAM AND PERCUTANEOUS COIL EMBOLIZATION COMPARISON:  CT abdomen and pelvis-earlier same day; 04/12/2021; 04/07/2021; CT guided right trans gluteal approach percutaneous drainage catheter placement-04/13/2021 MEDICATIONS: None ANESTHESIA/SEDATION: Moderate (conscious) sedation was employed during this procedure. A total of Fentanyl 100 mcg was administered intravenously. Moderate Sedation Time: 45 minutes. The patient's level of consciousness and vital signs were monitored continuously by radiology nursing throughout the procedure under my direct supervision. CONTRAST:  60 cc Omnipaque 300 FLUOROSCOPY TIME:  10 minutes, 12 seconds (9,833 mGy) COMPLICATIONS: None immediate. PROCEDURE: Informed consent was obtained from the patient following explanation of the procedure, risks, benefits and alternatives. All questions were addressed. A time out was performed prior to the initiation of the procedure. Maximal barrier sterile technique utilized including caps, mask, sterile gowns, sterile gloves, large sterile drape, hand hygiene, and Betadine prep. The right femoral head was marked fluoroscopically. Under sterile conditions and local anesthesia, the right common femoral artery access was performed  with a micropuncture needle. Under direct ultrasound guidance, the right common femoral was accessed with a micropuncture kit. An ultrasound image was saved for documentation purposes. This allowed for placement of a 5-French vascular sheath. A limited arteriogram was performed through the side arm of the sheath confirming appropriate access within the right common femoral artery. Over a Bentson wire, a Mickelson catheter was advanced the caudal aspect of the thoracic aorta where was reformed, back bled and flushed. The Mickelson catheter was then utilized to select the celiac artery and a selective celiac arteriogram was performed. Several additional selective celiac arteriograms were performed in various obliquities demonstrating the origin of the left renal artery. With the use of a fathom 16 microwire, a regular Renegade microcatheter was utilized to select the splenic artery and a selective splenic arteriogram was performed. The microcatheter was advanced to the level of the bifurcation of the splenic artery and the vessel was percutaneously coil embolized from this location to its mid aspect with multiple overlapping 8 mm, 6 mm, 5 mm and 4 mm interlock coils. Multiple selective arteriograms were performed during the embolization. Ultimately, the microcatheter was retracted to the proximal-mid aspect of the splenic artery and a post embolization splenic arteriogram was performed. The microcatheter was removed and a completion celiac arteriogram was performed via the [facility] catheter. Images were reviewed and the procedure was terminated. All wires, catheters and sheaths were removed from the patient. Hemostasis was achieved at the right groin access site with deployment of an ExoSeal closure device and manual compression. The patient tolerated the procedure well without immediate post procedural complication. FINDINGS: Selective celiac arteriogram demonstrates a conventional branching pattern, though the main  celiac trunk is deviated to the patient's right secondary to mass effect due to the splenic rupture and hematoma. There is mottled enhancement of the spleen without discrete area contrast extravasation or vessel irregularity. Following percutaneous embolization there is complete occlusion of the distal aspect of the splenic artery with minimal persistent splenic perfusion via collateral supply from the short gastrics. IMPRESSION: Technically successful percutaneous coil embolization of the splenic artery for splenic rupture. PLAN: - The patient is to remain flat for 4 hours with right  leg straight. - Continue resuscitation efforts as per the providing clinical team. Electronically Signed   By: Sandi Mariscal M.D.   On: 04/16/2021 07:34   IR US Guide Vasc Access Right  Result Date: 04/16/2021 INDICATION: Recent ventral hernia repair as well as postoperative right trans gluteal approach percutaneous catheter placement now admitted with apparently spontaneous splenic rupture. Please perform mesenteric arteriogram and percutaneous embolization as indicated. EXAM: 1. ULTRASOUND GUIDANCE FOR ARTERIAL ACCESS 2. SELECTIVE CELIAC ARTERIOGRAM 3. SELECTIVE SPLENIC ARTERIOGRAM AND PERCUTANEOUS COIL EMBOLIZATION COMPARISON:  CT abdomen and pelvis-earlier same day; 04/12/2021; 04/07/2021; CT guided right trans gluteal approach percutaneous drainage catheter placement-04/13/2021 MEDICATIONS: None ANESTHESIA/SEDATION: Moderate (conscious) sedation was employed during this procedure. A total of Fentanyl 100 mcg was administered intravenously. Moderate Sedation Time: 45 minutes. The patient's level of consciousness and vital signs were monitored continuously by radiology nursing throughout the procedure under my direct supervision. CONTRAST:  60 cc Omnipaque 300 FLUOROSCOPY TIME:  10 minutes, 12 seconds (8,264 mGy) COMPLICATIONS: None immediate. PROCEDURE: Informed consent was obtained from the patient following explanation of the  procedure, risks, benefits and alternatives. All questions were addressed. A time out was performed prior to the initiation of the procedure. Maximal barrier sterile technique utilized including caps, mask, sterile gowns, sterile gloves, large sterile drape, hand hygiene, and Betadine prep. The right femoral head was marked fluoroscopically. Under sterile conditions and local anesthesia, the right common femoral artery access was performed with a micropuncture needle. Under direct ultrasound guidance, the right common femoral was accessed with a micropuncture kit. An ultrasound image was saved for documentation purposes. This allowed for placement of a 5-French vascular sheath. A limited arteriogram was performed through the side arm of the sheath confirming appropriate access within the right common femoral artery. Over a Bentson wire, a Mickelson catheter was advanced the caudal aspect of the thoracic aorta where was reformed, back bled and flushed. The Mickelson catheter was then utilized to select the celiac artery and a selective celiac arteriogram was performed. Several additional selective celiac arteriograms were performed in various obliquities demonstrating the origin of the left renal artery. With the use of a fathom 16 microwire, a regular Renegade microcatheter was utilized to select the splenic artery and a selective splenic arteriogram was performed. The microcatheter was advanced to the level of the bifurcation of the splenic artery and the vessel was percutaneously coil embolized from this location to its mid aspect with multiple overlapping 8 mm, 6 mm, 5 mm and 4 mm interlock coils. Multiple selective arteriograms were performed during the embolization. Ultimately, the microcatheter was retracted to the proximal-mid aspect of the splenic artery and a post embolization splenic arteriogram was performed. The microcatheter was removed and a completion celiac arteriogram was performed via the [facility]  catheter. Images were reviewed and the procedure was terminated. All wires, catheters and sheaths were removed from the patient. Hemostasis was achieved at the right groin access site with deployment of an ExoSeal closure device and manual compression. The patient tolerated the procedure well without immediate post procedural complication. FINDINGS: Selective celiac arteriogram demonstrates a conventional branching pattern, though the main celiac trunk is deviated to the patient's right secondary to mass effect due to the splenic rupture and hematoma. There is mottled enhancement of the spleen without discrete area contrast extravasation or vessel irregularity. Following percutaneous embolization there is complete occlusion of the distal aspect of the splenic artery with minimal persistent splenic perfusion via collateral supply from the short gastrics. IMPRESSION: Technically successful  percutaneous coil embolization of the splenic artery for splenic rupture. PLAN: - The patient is to remain flat for 4 hours with right leg straight. - Continue resuscitation efforts as per the providing clinical team. Electronically Signed   By: Sandi Mariscal M.D.   On: 04/16/2021 07:34   IR EMBO ART  VEN HEMORR LYMPH EXTRAV  INC GUIDE ROADMAPPING  Result Date: 04/16/2021 INDICATION: Recent ventral hernia repair as well as postoperative right trans gluteal approach percutaneous catheter placement now admitted with apparently spontaneous splenic rupture. Please perform mesenteric arteriogram and percutaneous embolization as indicated. EXAM: 1. ULTRASOUND GUIDANCE FOR ARTERIAL ACCESS 2. SELECTIVE CELIAC ARTERIOGRAM 3. SELECTIVE SPLENIC ARTERIOGRAM AND PERCUTANEOUS COIL EMBOLIZATION COMPARISON:  CT abdomen and pelvis-earlier same day; 04/12/2021; 04/07/2021; CT guided right trans gluteal approach percutaneous drainage catheter placement-04/13/2021 MEDICATIONS: None ANESTHESIA/SEDATION: Moderate (conscious) sedation was employed during  this procedure. A total of Fentanyl 100 mcg was administered intravenously. Moderate Sedation Time: 45 minutes. The patient's level of consciousness and vital signs were monitored continuously by radiology nursing throughout the procedure under my direct supervision. CONTRAST:  60 cc Omnipaque 300 FLUOROSCOPY TIME:  10 minutes, 12 seconds (5,170 mGy) COMPLICATIONS: None immediate. PROCEDURE: Informed consent was obtained from the patient following explanation of the procedure, risks, benefits and alternatives. All questions were addressed. A time out was performed prior to the initiation of the procedure. Maximal barrier sterile technique utilized including caps, mask, sterile gowns, sterile gloves, large sterile drape, hand hygiene, and Betadine prep. The right femoral head was marked fluoroscopically. Under sterile conditions and local anesthesia, the right common femoral artery access was performed with a micropuncture needle. Under direct ultrasound guidance, the right common femoral was accessed with a micropuncture kit. An ultrasound image was saved for documentation purposes. This allowed for placement of a 5-French vascular sheath. A limited arteriogram was performed through the side arm of the sheath confirming appropriate access within the right common femoral artery. Over a Bentson wire, a Mickelson catheter was advanced the caudal aspect of the thoracic aorta where was reformed, back bled and flushed. The Mickelson catheter was then utilized to select the celiac artery and a selective celiac arteriogram was performed. Several additional selective celiac arteriograms were performed in various obliquities demonstrating the origin of the left renal artery. With the use of a fathom 16 microwire, a regular Renegade microcatheter was utilized to select the splenic artery and a selective splenic arteriogram was performed. The microcatheter was advanced to the level of the bifurcation of the splenic artery and  the vessel was percutaneously coil embolized from this location to its mid aspect with multiple overlapping 8 mm, 6 mm, 5 mm and 4 mm interlock coils. Multiple selective arteriograms were performed during the embolization. Ultimately, the microcatheter was retracted to the proximal-mid aspect of the splenic artery and a post embolization splenic arteriogram was performed. The microcatheter was removed and a completion celiac arteriogram was performed via the [facility] catheter. Images were reviewed and the procedure was terminated. All wires, catheters and sheaths were removed from the patient. Hemostasis was achieved at the right groin access site with deployment of an ExoSeal closure device and manual compression. The patient tolerated the procedure well without immediate post procedural complication. FINDINGS: Selective celiac arteriogram demonstrates a conventional branching pattern, though the main celiac trunk is deviated to the patient's right secondary to mass effect due to the splenic rupture and hematoma. There is mottled enhancement of the spleen without discrete area contrast extravasation or vessel irregularity. Following  percutaneous embolization there is complete occlusion of the distal aspect of the splenic artery with minimal persistent splenic perfusion via collateral supply from the short gastrics. IMPRESSION: Technically successful percutaneous coil embolization of the splenic artery for splenic rupture. PLAN: - The patient is to remain flat for 4 hours with right leg straight. - Continue resuscitation efforts as per the providing clinical team. Electronically Signed   By: Sandi Mariscal M.D.   On: 04/16/2021 07:34    Labs:  CBC: Recent Labs    04/15/21 2343 04/16/21 0254 04/16/21 0758 04/17/21 0216 04/17/21 1030 04/17/21 1854 04/18/21 0317  WBC 31.2* 31.2*  --  16.8*  --   --  18.5*  HGB 10.9* 10.9*   < > 7.3* 6.7* 8.1* 7.4*  HCT 35.8* 36.4*   < > 23.1* 21.1* 25.3* 23.0*  PLT 785*  807*  --  446*  --   --  445*   < > = values in this interval not displayed.    COAGS: Recent Labs    08/12/20 0709 12/14/20 0903 04/13/21 1435 04/18/21 0711  INR 1.1 1.1 1.1 1.3*  APTT 30 30  --  34    BMP: Recent Labs    04/15/21 2343 04/16/21 0254 04/17/21 0216 04/18/21 0317  NA 135 136 134* 134*  K 3.9 5.1 4.5 3.8  CL 102 105 103 101  CO2 21* $Remov'22 25 27  'ZztVgL$ GLUCOSE 189* 169* 117* 106*  BUN $Re'19 21 16 9  'tyb$ CALCIUM 8.8* 8.0* 7.9* 8.0*  CREATININE 1.71* 1.23 0.83 0.87  GFRNONAA 43* >60 >60 >60    LIVER FUNCTION TESTS: Recent Labs    04/08/21 0432 04/11/21 0302 04/14/21 0421 04/15/21 2343  BILITOT 0.6 0.4 0.4 0.4  AST $Re'19 17 22 25  'xCE$ ALT $R'14 12 21 26  'vt$ ALKPHOS 63 62 74 78  PROT 5.8* 6.1* 6.7 7.7  ALBUMIN 2.4* 2.4* 2.6* 3.2*    Assessment and Plan: 70 y.o. male who is status post retrovascular recurrent incisional hernia repair with Vicryl mesh, reconstruction of the posterior layer and Phasix absorbable retrovascular mesh, and small bowel resection on 04/04/2021; now with post op pelvic/rectovesical pouch fluid collection, status post drain placement on 7/6; status post coil embolization of splenic artery for spontaneous splenic rupture on 7/9; afebrile, WBC 18.5 up from 16.8, hemoglobin 7.3 down from 7.4, platelets 445k, creatinine normal; drain fluid cultures negative to date, latest CT reviewed by Dr. Pascal Lux again today-at this time and with recent hemorrhage recommend continuation of current drain in case blood products from recent splenic rupture drift to pelvic region; monitor hemoglobin, vitals and patient's symptoms very closely   Electronically Signed: D. Rowe Robert, PA-C 04/18/2021, 2:26 PM   I spent a total of 15 Minutes at the the patient's bedside AND on the patient's hospital floor or unit, greater than 50% of which was counseling/coordinating care for pelvic fluid collection drain and splenic artery coil embolization    Patient ID: Brian Atkinson,  male   DOB: 1951-07-24, 70 y.o.   MRN: [id number]

## 2021-04-19 ENCOUNTER — Inpatient Hospital Stay: Payer: Self-pay

## 2021-04-19 LAB — GLUCOSE, CAPILLARY
Glucose-Capillary: 109 mg/dL — ABNORMAL HIGH (ref 70–99)
Glucose-Capillary: 108 mg/dL — ABNORMAL HIGH (ref 70–99)
Glucose-Capillary: 129 mg/dL — ABNORMAL HIGH (ref 70–99)
Glucose-Capillary: 111 mg/dL — ABNORMAL HIGH (ref 70–99)
Glucose-Capillary: 121 mg/dL — ABNORMAL HIGH (ref 70–99)

## 2021-04-19 LAB — CBC
HCT: 22 % — ABNORMAL LOW (ref 39.0–52.0)
Hemoglobin: 7 g/dL — ABNORMAL LOW (ref 13.0–17.0)
MCH: 28 pg (ref 26.0–34.0)
MCHC: 31.8 g/dL (ref 30.0–36.0)
MCV: 88 fL (ref 80.0–100.0)
Platelets: 468 10*3/uL — ABNORMAL HIGH (ref 150–400)
RBC: 2.5 MIL/uL — ABNORMAL LOW (ref 4.22–5.81)
RDW: 15.1 % (ref 11.5–15.5)
WBC: 19 10*3/uL — ABNORMAL HIGH (ref 4.0–10.5)
nRBC: 0.1 % (ref 0.0–0.2)

## 2021-04-19 LAB — HEMOGLOBIN AND HEMATOCRIT, BLOOD
HCT: 27.3 % — ABNORMAL LOW (ref 39.0–52.0)
Hemoglobin: 8.6 g/dL — ABNORMAL LOW (ref 13.0–17.0)

## 2021-04-19 NOTE — Progress Notes (Signed)
Pharmacy: TPN Brief Note  70 yo M to start TPN for nutrition support due to prolonged ileus post-op.    Plan: TPN order placed after noon cut off. PICC line ordered for TPN access. TPN labs ordered for 7/13. TPN will begin 7/13 @ 1800   Thanks Herby Abraham, Pharm.D 04/19/2021 4:56 PM

## 2021-04-19 NOTE — Progress Notes (Signed)
Progress Note: General Surgery Service   Chief Complaint/Subjective: Left sided pain.  Not drinking much due to pain.  Gas pains come and go.  Objective: Vital signs in last 24 hours: Temp:  [98.6 F (37 C)-99.1 F (37.3 C)] 98.6 F (37 C) (07/12 1257) Pulse Rate:  [93-120] 109 (07/12 1300) Resp:  [12-25] 18 (07/12 1300) BP: (110-139)/(62-76) 125/68 (07/12 1200) SpO2:  [83 %-97 %] 92 % (07/12 1300) Last BM Date: 04/14/21  Intake/Output from previous day: 07/11 0701 - 07/12 0700 In: 1292 [P.O.:1140; I.V.:52; IV Piggyback:100] Out: 1202 [Urine:1200; Drains:2] Intake/Output this shift: Total I/O In: 480 [P.O.:480] Out: -   GI: Abd Distended, incision c/d/I w/ staples healing, JP drain out of buttock with no drainage   Lab Results: CBC  Recent Labs    04/18/21 0317 04/18/21 1427 04/19/21 0241 04/19/21 1255  WBC 18.5*  --  19.0*  --   HGB 7.4*   < > 7.0* 8.6*  HCT 23.0*   < > 22.0* 27.3*  PLT 445*  --  468*  --    < > = values in this interval not displayed.    BMET Recent Labs    04/17/21 0216 04/18/21 0317  NA 134* 134*  K 4.5 3.8  CL 103 101  CO2 25 27  GLUCOSE 117* 106*  BUN 16 9  CREATININE 0.83 0.87  CALCIUM 7.9* 8.0*    PT/INR Recent Labs    04/18/21 0711  LABPROT 16.4*  INR 1.3*    ABG No results for input(s): PHART, HCO3 in the last 72 hours.  Invalid input(s): PCO2, PO2  Anti-infectives: Anti-infectives (From admission, onward)    Start     Dose/Rate Route Frequency Ordered Stop   04/16/21 1800  piperacillin-tazobactam (ZOSYN) IVPB 3.375 g  Status:  Discontinued       Note to Pharmacy: Pharmacy may adjust dosing strength, schedule, rate of infusion, etc as needed to optimize therapy   3.375 g 12.5 mL/hr over 240 Minutes Intravenous Every 8 hours 04/16/21 1640 04/18/21 1014   04/16/21 1730  piperacillin-tazobactam (ZOSYN) IVPB 3.375 g  Status:  Discontinued       Note to Pharmacy: Pharmacy may adjust dosing strength, schedule, rate  of infusion, etc as needed to optimize therapy   3.375 g 12.5 mL/hr over 240 Minutes Intravenous Every 8 hours 04/16/21 1640 04/16/21 1640   04/16/21 1000  cefTRIAXone (ROCEPHIN) 2 g in sodium chloride 0.9 % 100 mL IVPB  Status:  Discontinued       Note to Pharmacy: Pharmacy may adjust dosing strength / duration / interval for maximal efficacy   2 g 200 mL/hr over 30 Minutes Intravenous Every 24 hours 04/16/21 0828 04/16/21 1640   04/16/21 1000  metroNIDAZOLE (FLAGYL) IVPB 500 mg  Status:  Discontinued        500 mg 100 mL/hr over 60 Minutes Intravenous Every 6 hours 04/16/21 0828 04/16/21 1640   04/16/21 0100  piperacillin-tazobactam (ZOSYN) IVPB 3.375 g        3.375 g 12.5 mL/hr over 240 Minutes Intravenous  Once 04/16/21 0057 04/16/21 0143       Medications: Scheduled Meds:  acetaminophen  1,000 mg Oral Q6H   Chlorhexidine Gluconate Cloth  6 each Topical Q0600   enoxaparin (LOVENOX) injection  40 mg Subcutaneous Q24H   feeding supplement  237 mL Oral BID BM   gabapentin  300 mg Oral TID   insulin aspart  0-9 Units Subcutaneous Q4H   lip balm  1 application Topical BID   mouth rinse  15 mL Mouth Rinse BID   pantoprazole  40 mg Oral BID   polycarbophil  625 mg Oral BID   polyethylene glycol  17 g Oral Daily   sodium chloride flush  3 mL Intravenous Q12H   sodium chloride flush  3 mL Intravenous Q12H   Continuous Infusions:  sodium chloride     sodium chloride Stopped (04/18/21 1120)   ondansetron (ZOFRAN) IV     PRN Meds:.sodium chloride, sodium chloride, alum & mag hydroxide-simeth, bisacodyl, diphenhydrAMINE, HYDROmorphone (DILAUDID) injection, magic mouthwash, menthol-cetylpyridinium, ondansetron (ZOFRAN) IV **OR** ondansetron (ZOFRAN) IV, ondansetron **OR** [DISCONTINUED] ondansetron (ZOFRAN) IV, oxyCODONE, phenol, prochlorperazine, simethicone, sodium chloride flush, sodium chloride flush  Assessment/Plan: Brian Atkinson is a 70 year old male who underwent recurrent  incisional hernia repair with removal of previous intraperitoneal mesh and rives stoppa style repair.  His posterior layer broke down where the previous mesh had been removed requiring takeback to the operating room for diagnostic laparotomy converted to exploratory laparotomy, small bowel resection with vicryl mesh reconstruction of the posterior layer and phasix retromuscular mesh placement on 04/04/2021. He recovered from this in the hospital, with a prolonged postoperative ileus which was not unexpected due to the large interparietal hernia.  A CT guided drain was placed on 04/13/21, to drain a pelvic fluid collection.  He was discharged on 04/15/21.  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  On 04/16/21, he underwent coil embolization of the splenic artery.  He is now recovering in the step-down unit with improving tachycardia and stabilizing hemoglobin.  Not tolerating diet Sips of CLD, TPN,  IR plans to keep drain for now - blood may eventually drain to pelvis HGB stabilizing, transition to daily HGB If HGB stable in AM will restart heparin and transfer to floor    LOS: 3 days   FEN: Sips of CLD, TPN ID: None VTE: SCDs, hold off on medical ppx Foley: None Dispo: Continued care in stepdown    Brian Ore, MD  [facility] Use [email or web address] to contact on call provider

## 2021-04-20 LAB — COMPREHENSIVE METABOLIC PANEL
ALT: 10 U/L (ref 0–44)
AST: 12 U/L — ABNORMAL LOW (ref 15–41)
Albumin: 2.4 g/dL — ABNORMAL LOW (ref 3.5–5.0)
Alkaline Phosphatase: 58 U/L (ref 38–126)
Anion gap: 8 (ref 5–15)
BUN: 8 mg/dL (ref 8–23)
CO2: 27 mmol/L (ref 22–32)
Calcium: 8.1 mg/dL — ABNORMAL LOW (ref 8.9–10.3)
Chloride: 99 mmol/L (ref 98–111)
Creatinine, Ser: 0.62 mg/dL (ref 0.61–1.24)
GFR, Estimated: >60 mL/min (ref >60)
Glucose, Bld: 105 mg/dL — ABNORMAL HIGH (ref 70–99)
Potassium: 3.8 mmol/L (ref 3.5–5.1)
Sodium: 134 mmol/L — ABNORMAL LOW (ref 135–145)
Total Bilirubin: 0.5 mg/dL (ref 0.3–1.2)
Total Protein: 6.3 g/dL — ABNORMAL LOW (ref 6.5–8.1)

## 2021-04-20 LAB — PREALBUMIN: Prealbumin: 10.5 mg/dL — ABNORMAL LOW (ref 18–38)

## 2021-04-20 LAB — DIFFERENTIAL
Abs Immature Granulocytes: 0.24 10*3/uL — ABNORMAL HIGH (ref 0.00–0.07)
Basophils Absolute: 0.1 10*3/uL (ref 0.0–0.1)
Basophils Relative: 0 %
Eosinophils Absolute: 0.1 10*3/uL (ref 0.0–0.5)
Eosinophils Relative: 1 %
Immature Granulocytes: 1 %
Lymphocytes Relative: 9 %
Lymphs Abs: 1.8 10*3/uL (ref 0.7–4.0)
Monocytes Absolute: 1.8 10*3/uL — ABNORMAL HIGH (ref 0.1–1.0)
Monocytes Relative: 9 %
Neutro Abs: 15.8 10*3/uL — ABNORMAL HIGH (ref 1.7–7.7)
Neutrophils Relative %: 80 %

## 2021-04-20 LAB — GLUCOSE, CAPILLARY
Glucose-Capillary: 101 mg/dL — ABNORMAL HIGH (ref 70–99)
Glucose-Capillary: 102 mg/dL — ABNORMAL HIGH (ref 70–99)
Glucose-Capillary: 112 mg/dL — ABNORMAL HIGH (ref 70–99)
Glucose-Capillary: 117 mg/dL — ABNORMAL HIGH (ref 70–99)
Glucose-Capillary: 118 mg/dL — ABNORMAL HIGH (ref 70–99)
Glucose-Capillary: 130 mg/dL — ABNORMAL HIGH (ref 70–99)
Glucose-Capillary: 145 mg/dL — ABNORMAL HIGH (ref 70–99)

## 2021-04-20 LAB — CBC
HCT: 23.6 % — ABNORMAL LOW (ref 39.0–52.0)
Hemoglobin: 7.3 g/dL — ABNORMAL LOW (ref 13.0–17.0)
MCH: 28.1 pg (ref 26.0–34.0)
MCHC: 30.9 g/dL (ref 30.0–36.0)
MCV: 90.8 fL (ref 80.0–100.0)
Platelets: 512 10*3/uL — ABNORMAL HIGH (ref 150–400)
RBC: 2.6 MIL/uL — ABNORMAL LOW (ref 4.22–5.81)
RDW: 15.7 % — ABNORMAL HIGH (ref 11.5–15.5)
WBC: 19.8 10*3/uL — ABNORMAL HIGH (ref 4.0–10.5)
nRBC: 0.2 % (ref 0.0–0.2)

## 2021-04-20 LAB — TRIGLYCERIDES: Triglycerides: 85 mg/dL (ref <150)

## 2021-04-20 LAB — PHOSPHORUS: Phosphorus: 3 mg/dL (ref 2.5–4.6)

## 2021-04-20 LAB — MAGNESIUM: Magnesium: 1.8 mg/dL (ref 1.7–2.4)

## 2021-04-20 MED ORDER — BOOST / RESOURCE BREEZE PO LIQD CUSTOM
1.0000 | ORAL | Status: DC
  Administered 2021-04-20 – 2021-04-25 (×5): 1 via ORAL

## 2021-04-20 MED ORDER — TRACE MINERALS CU-MN-SE-ZN 300-55-60-3000 MCG/ML IV SOLN
INTRAVENOUS | Status: AC
  Filled 2021-04-20: qty 422.4

## 2021-04-20 MED ORDER — PROSOURCE PLUS PO LIQD
30.0000 mL | Freq: Every day | ORAL | Status: DC
  Administered 2021-04-20 – 2021-04-24 (×5): 30 mL via ORAL
  Filled 2021-04-20 (×5): qty 30

## 2021-04-20 MED ORDER — SODIUM CHLORIDE 0.9% FLUSH
10.0000 mL | Freq: Two times a day (BID) | INTRAVENOUS | Status: DC
  Administered 2021-04-20 – 2021-04-21 (×3): 10 mL

## 2021-04-20 MED ORDER — SODIUM CHLORIDE 0.9% FLUSH: 10.0000 mL | INTRAVENOUS | Status: DC | PRN

## 2021-04-20 NOTE — Progress Notes (Signed)
Peripherally Inserted Central Catheter Placement  The IV Nurse has discussed with the patient and/or persons authorized to consent for the patient, the purpose of this procedure and the potential benefits and risks involved with this procedure.  The benefits include less needle sticks, lab draws from the catheter, and the patient may be discharged home with the catheter. Risks include, but not limited to, infection, bleeding, blood clot (thrombus formation), and puncture of an artery; nerve damage and irregular heartbeat and possibility to perform a PICC exchange if needed/ordered by physician.  Alternatives to this procedure were also discussed.  Bard Power PICC patient education guide, fact sheet on infection prevention and patient information card has been provided to patient /or left at bedside.    PICC Placement Documentation  PICC Double Lumen 04/20/21 PICC Right Basilic 38 cm 0 cm (Active)  Indication for Insertion or Continuance of Line Administration of hyperosmolar/irritating solutions (i.e. TPN, Vancomycin, etc.) 04/20/21 1657  Exposed Catheter (cm) 0 cm 04/20/21 1657  Site Assessment Clean;Dry;Intact 04/20/21 1657  Lumen #1 Status Flushed;Saline locked;Blood return noted 04/20/21 1657  Lumen #2 Status Flushed;Saline locked;Blood return noted 04/20/21 1657  Dressing Type Transparent;Securing device 04/20/21 1657  Dressing Status Clean;Dry;Intact 04/20/21 1657  Antimicrobial disc in place? Yes 04/20/21 1657  Dressing Intervention New dressing 04/20/21 1657  Dressing Change Due 04/27/21 04/20/21 1657       Annett Fabian 04/20/2021, 4:58 PM

## 2021-04-20 NOTE — Progress Notes (Signed)
PHARMACY - TOTAL PARENTERAL NUTRITION CONSULT NOTE   Indication: Prolonged ileus  Patient Measurements: Height: 5' 11.5" (181.6 cm) Weight: 103.9 kg (229 lb 0.9 oz) IBW/kg (Calculated) : 76.45   Body mass index is 31.5 kg/m. Usual Weight:   Assessment:  70 yo male s/p ventral hernia repair with mesh on 6/20. 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 He had a postoperative ileus for which he was on TPN. He also developed a pelvic abscess which was drained by IR on 7/6. His surgical retrorectus drain was removed on 7/7. Prior to discharge he was tolerating a diet and TPN was stopped. He was discharged home 7/8.  He was readmitted 7/9 with hypotension, AKI and leukocytosis.  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  On 04/16/21, he underwent coil embolization of the splenic artery.  He has not been tolerating diet, so Pharmacy has been consulted to start TPN.  Glucose / Insulin: No hx DM, CBGs controled on SSI q4h - 2 required in past 24h Electrolytes: Na low 134, others WNL including CorrCa 9.38 Renal: BUN/SCr WNL, UOP 850/24h Hepatic: WNL Prealb: 10.5 TG: 85 Intake / Output: net -370 MIVF: none GI Imaging: GI Surgeries / Procedures: - 6/20: repair of recurrent ventral hernia with mesh - 6/27: retromuscular recurrent incisional hernia repair with vicral mesh, SB resection - 7/6: CT guided pelvic fluid collection drain placement - 7/9: coil embolization of splenic artery  Central access: plan PICC 7/13 TPN start date: plan 7/13  Nutritional Goals (per RD recommendation on 7/7): kCal: 2150-2350, Protein: 100-115, Fluid: 2.3L/day Goal TPN rate is 100 mL/hr (provides 106 g of protein and 2246 kcals per day)  Current Nutrition:  Clear  liquids  Plan:  Start TPN at 82mL/hr at 1800 - this will provide 42g protein, 898kcal Electrolytes in TPN: standard Na 28mEq/L,  K 66mEq/L,  Ca 28mEq/L,  Mg 13mEq/L,  Phos 87mmol/L.  Cl:Ac 1:1 Add standard MVI and trace elements to TPN Continue Sensitive q4h SSI and adjust as needed  IVF per MD - none currently Monitor TPN labs on Mon/Thurs  Loralee Pacas, PharmD, BCPS Pharmacy: [phone] 04/20/2021,7:13 AM

## 2021-04-20 NOTE — Progress Notes (Addendum)
Progress Note: General Surgery Service   Chief Complaint/Subjective: Passing flatus, no bowel movements.  Feels full with just a little clear liquids.  Objective: Vital signs in last 24 hours: Temp:  [97.9 F (36.6 C)-100.8 F (38.2 C)] 98.9 F (37.2 C) (07/13 1250) Pulse Rate:  [98-117] 107 (07/13 1200) Resp:  [12-26] 18 (07/13 1200) BP: (120-157)/(66-91) 139/91 (07/13 1200) SpO2:  [88 %-98 %] 88 % (07/13 1200) Last BM Date: 04/15/21  Intake/Output from previous day: 07/12 0701 - 07/13 0700 In: 480 [P.O.:480] Out: 850 [Urine:850] Intake/Output this shift: Total I/O In: -  Out: 450 [Urine:450]  GI: Abd Distended, incision c/d/I w/ staples healing, JP drain out of buttock with no drainage   Lab Results: CBC  Recent Labs    04/19/21 0241 04/19/21 1255 04/20/21 0242  WBC 19.0*  --  19.8*  HGB 7.0* 8.6* 7.3*  HCT 22.0* 27.3* 23.6*  PLT 468*  --  512*    BMET Recent Labs    04/18/21 0317 04/20/21 0242  NA 134* 134*  K 3.8 3.8  CL 101 99  CO2 27 27  GLUCOSE 106* 105*  BUN 9 8  CREATININE 0.87 0.62  CALCIUM 8.0* 8.1*    PT/INR Recent Labs    04/18/21 0711  LABPROT 16.4*  INR 1.3*    ABG No results for input(s): PHART, HCO3 in the last 72 hours.  Invalid input(s): PCO2, PO2  Anti-infectives: Anti-infectives (From admission, onward)    Start     Dose/Rate Route Frequency Ordered Stop   04/16/21 1800  piperacillin-tazobactam (ZOSYN) IVPB 3.375 g  Status:  Discontinued       Note to Pharmacy: Pharmacy may adjust dosing strength, schedule, rate of infusion, etc as needed to optimize therapy   3.375 g 12.5 mL/hr over 240 Minutes Intravenous Every 8 hours 04/16/21 1640 04/18/21 1014   04/16/21 1730  piperacillin-tazobactam (ZOSYN) IVPB 3.375 g  Status:  Discontinued       Note to Pharmacy: Pharmacy may adjust dosing strength, schedule, rate of infusion, etc as needed to optimize therapy   3.375 g 12.5 mL/hr over 240 Minutes Intravenous Every 8 hours  04/16/21 1640 04/16/21 1640   04/16/21 1000  cefTRIAXone (ROCEPHIN) 2 g in sodium chloride 0.9 % 100 mL IVPB  Status:  Discontinued       Note to Pharmacy: Pharmacy may adjust dosing strength / duration / interval for maximal efficacy   2 g 200 mL/hr over 30 Minutes Intravenous Every 24 hours 04/16/21 0828 04/16/21 1640   04/16/21 1000  metroNIDAZOLE (FLAGYL) IVPB 500 mg  Status:  Discontinued        500 mg 100 mL/hr over 60 Minutes Intravenous Every 6 hours 04/16/21 0828 04/16/21 1640   04/16/21 0100  piperacillin-tazobactam (ZOSYN) IVPB 3.375 g        3.375 g 12.5 mL/hr over 240 Minutes Intravenous  Once 04/16/21 0057 04/16/21 0143       Medications: Scheduled Meds:  (feeding supplement) PROSource Plus  30 mL Oral Daily   acetaminophen  1,000 mg Oral Q6H   Chlorhexidine Gluconate Cloth  6 each Topical Q0600   enoxaparin (LOVENOX) injection  40 mg Subcutaneous Q24H   feeding supplement  1 Container Oral Q24H   gabapentin  300 mg Oral TID   insulin aspart  0-9 Units Subcutaneous Q4H   lip balm  1 application Topical BID   mouth rinse  15 mL Mouth Rinse BID   pantoprazole  40 mg Oral BID  polycarbophil  625 mg Oral BID   polyethylene glycol  17 g Oral Daily   sodium chloride flush  3 mL Intravenous Q12H   sodium chloride flush  3 mL Intravenous Q12H   Continuous Infusions:  sodium chloride     sodium chloride Stopped (04/18/21 1120)   ondansetron (ZOFRAN) IV     TPN ADULT (ION)     PRN Meds:.sodium chloride, sodium chloride, alum & mag hydroxide-simeth, bisacodyl, diphenhydrAMINE, HYDROmorphone (DILAUDID) injection, magic mouthwash, menthol-cetylpyridinium, ondansetron (ZOFRAN) IV **OR** ondansetron (ZOFRAN) IV, ondansetron **OR** [DISCONTINUED] ondansetron (ZOFRAN) IV, oxyCODONE, phenol, prochlorperazine, simethicone, sodium chloride flush, sodium chloride flush  Assessment/Plan: Brian Atkinson is a 70 year old male who underwent recurrent incisional hernia repair with removal of  previous intraperitoneal mesh and rives stoppa style repair.  His posterior layer broke down where the previous mesh had been removed requiring takeback to the operating room for diagnostic laparotomy converted to exploratory laparotomy, small bowel resection with vicryl mesh reconstruction of the posterior layer and phasix retromuscular mesh placement on 04/04/2021. He recovered from this in the hospital, with a prolonged postoperative ileus which was not unexpected due to the large interparietal hernia.  A CT guided drain was placed on 04/13/21, to drain a pelvic fluid collection.  He was discharged on 04/15/21.  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  On 04/16/21, he underwent coil embolization of the splenic artery.  He is now recovering in the step-down unit with improving tachycardia and stabilizing hemoglobin.  Not tolerating diet Sips of CLD, TPN,  IR plans to keep drain for now - blood may eventually drain to pelvis HGB stabilizing, transition to daily HGB     LOS: 4 days   FEN: Sips of CLD, TPN ID: None VTE: SCDs, lovenox Foley: None Dispo: Continued care in stepdown    Brian Ore, MD  [facility] Use [email or web address] to contact on call provider

## 2021-04-20 NOTE — Progress Notes (Signed)
Initial Nutrition Assessment  DOCUMENTATION CODES:   Non-severe (moderate) malnutrition in context of acute illness/injury, Obesity unspecified  INTERVENTION:  - will order Boost Breeze once/day, each supplement provides 250 kcal and 9 grams of protein. - will order 30 ml Prosource once/day, each supplement provides 100 kcal and 15 grams protein.  - TPN initiation and management per Pharmacist. - diet advancement as medically feasible.    NUTRITION DIAGNOSIS:   Moderate Malnutrition related to acute illness (recent abdominal surgeries) as evidenced by mild fat depletion, mild muscle depletion.  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 He required TPN during that admission and was discharged off TPN, on Soft diet on 7/8. He returned to the ED on 7/9 d/t worsening severe abdominal pain, nausea, and several syncopal episodes.  Patient was last seen by a RD on 7/7 at which time he was being weaned off TPN and was eating 100% on a Soft diet. He was discharged on 7/8.   Patient re-admitted on 7/9 and ordered CLD on that date at 0825, advanced to Regular on 7/11 at 1013, and downgraded back to CLD yesterday at 1625.  He was consuming 75-100% of all meals since admission on 7/9.  Patient laying in bed with no family or visitors present. He has consumed a few sips of soda this AM. He confirms that he was informed of plan to resume TPN today.  Patient reports being very tired, and he fell back to sleep frequently during brief RD visit. Unable  to obtain information from patient today.  Weight on 7/9 was 229 lb, weight on 6/27 was 227 lb, and weight on 12/14/20 was 229 lb.   He does not currently have a PICC or central line. Order in place for TPN at 40 ml/hr to start today at 1800 d/t prolonged post-op ileus.     Labs reviewed; CBGs: 102, 112, 101, 118 mg/dl, Na: 448 mmol/l, Ca: 8.1 mg/dl. Medications reviewed; sliding scale novolog, 40 mg oral protonix BID, 625 mg fibercon BID, 17 g miralax/day.     NUTRITION - FOCUSED PHYSICAL EXAM:  Flowsheet Row Most Recent Value  Orbital Region Mild depletion  Upper Arm Region Mild depletion  Thoracic and Lumbar Region Unable to assess  Buccal Region No depletion  Temple Region No depletion  Clavicle Bone Region Mild depletion  Clavicle and Acromion Bone Region Mild depletion  Scapular Bone Region Unable to assess  Dorsal Hand Mild depletion  Patellar Region No depletion  Anterior Thigh Region No depletion  Posterior Calf Region No depletion  Edema (RD Assessment) None  Hair Reviewed  Eyes Unable to assess  Mouth Unable to assess  Skin Reviewed  Nails Reviewed       Diet Order:   Diet Order             Diet clear liquid Room service appropriate? Yes; Fluid consistency: Thin  Diet effective now                   EDUCATION NEEDS:   Not appropriate for education at this time  Skin:  Skin Assessment: Skin Integrity Issues: Skin Integrity Issues:: Incisions Incisions: abdomen (6/27); R groin (7/9)  Last BM:  7/8  Height:   Ht Readings from Last 1 Encounters:  04/16/21 5' 11.5" (1.816 m)    Weight:   Wt Readings from Last 1 Encounters:  04/16/21 103.9 kg     Estimated Nutritional Needs:  Kcal:  2250-2500 kcal Protein:  115-130 grams Fluid:  >/= 2.4 L/day     Trenton Gammon, MS, RD, LDN, CNSC Inpatient Clinical Dietitian RD pager # available in AMION  After hours/weekend pager # available in [facility]

## 2021-04-21 LAB — COMPREHENSIVE METABOLIC PANEL
ALT: 13 U/L (ref 0–44)
AST: 16 U/L (ref 15–41)
Albumin: 2.4 g/dL — ABNORMAL LOW (ref 3.5–5.0)
Alkaline Phosphatase: 71 U/L (ref 38–126)
Anion gap: 7 (ref 5–15)
BUN: 10 mg/dL (ref 8–23)
CO2: 28 mmol/L (ref 22–32)
Calcium: 8.2 mg/dL — ABNORMAL LOW (ref 8.9–10.3)
Chloride: 99 mmol/L (ref 98–111)
Creatinine, Ser: 0.67 mg/dL (ref 0.61–1.24)
GFR, Estimated: >60 mL/min (ref >60)
Glucose, Bld: 126 mg/dL — ABNORMAL HIGH (ref 70–99)
Potassium: 4 mmol/L (ref 3.5–5.1)
Sodium: 134 mmol/L — ABNORMAL LOW (ref 135–145)
Total Bilirubin: 0.7 mg/dL (ref 0.3–1.2)
Total Protein: 6.5 g/dL (ref 6.5–8.1)

## 2021-04-21 LAB — CBC
HCT: 23 % — ABNORMAL LOW (ref 39.0–52.0)
Hemoglobin: 7.1 g/dL — ABNORMAL LOW (ref 13.0–17.0)
MCH: 27.5 pg (ref 26.0–34.0)
MCHC: 30.9 g/dL (ref 30.0–36.0)
MCV: 89.1 fL (ref 80.0–100.0)
Platelets: 464 10*3/uL — ABNORMAL HIGH (ref 150–400)
RBC: 2.58 MIL/uL — ABNORMAL LOW (ref 4.22–5.81)
RDW: 15.5 % (ref 11.5–15.5)
WBC: 18 10*3/uL — ABNORMAL HIGH (ref 4.0–10.5)
nRBC: 0.2 % (ref 0.0–0.2)

## 2021-04-21 LAB — GLUCOSE, CAPILLARY
Glucose-Capillary: 126 mg/dL — ABNORMAL HIGH (ref 70–99)
Glucose-Capillary: 128 mg/dL — ABNORMAL HIGH (ref 70–99)
Glucose-Capillary: 120 mg/dL — ABNORMAL HIGH (ref 70–99)
Glucose-Capillary: 105 mg/dL — ABNORMAL HIGH (ref 70–99)
Glucose-Capillary: 250 mg/dL — ABNORMAL HIGH (ref 70–99)

## 2021-04-21 LAB — CULTURE, BLOOD (ROUTINE X 2)
Culture: NO GROWTH
Culture: NO GROWTH
Special Requests: ADEQUATE
Special Requests: ADEQUATE

## 2021-04-21 LAB — MAGNESIUM: Magnesium: 2 mg/dL (ref 1.7–2.4)

## 2021-04-21 LAB — PHOSPHORUS: Phosphorus: 3.5 mg/dL (ref 2.5–4.6)

## 2021-04-21 MED ORDER — TRAVASOL 10 % IV SOLN
INTRAVENOUS | Status: AC
  Filled 2021-04-21: qty 900

## 2021-04-21 NOTE — Progress Notes (Signed)
Pt ex-wife Jakiah Goree) will be the MAIN point of contact. ALL pt info is to go through her and no one else. Please call her for ALL updates.   [phone] Cowgirl is the password  Also, Rosey Bath would like staff to know due to her own health, she will not be able to be physically present as much as she would like to, however, she would like to be called by the MDs/Rns for updates.

## 2021-04-21 NOTE — Progress Notes (Signed)
Spoke to patients family members early this morning about their concerns with his care. Patient has not had a bowel movement since 04/15/21, and has recently had an ileus, following a hernia repair that led to a bowel perforation that required another surgical procedure. Patient was re-admitted a day after being discharged the first time for a ruptured spleen. Per his wife she asked prior to discharge that the patient not be sent home because something did not seem right with him. There has been no antibiotics, or follow up imaging ordered since his emergent procedure that was done on 04/16/21 by IR. I was informed by Clovis Riley RN on 04/19/21 that family members had been present and expressed their concerns, and requested imaging from the provider for worsening left upper quadrant pain. When I gave report to the oncoming nurse Lurena Joiner at 0700 on 04/20/21 I told her that there wasn't currently a consult with CCM or TRH and to try to request an order from the provider during rounds. Lurena Joiner told me at shift change on the night of 04/20/21 that she was unable to speak to the provider because she was in another patient's room when he rounded. Last diagnostic image recorded on 04/16/21 was a CT of the abdomen prior to going to IR. Patient was getting out of bed during the day, but has been unable to for a few days now due to lethargy and pain. Wife and daughter are insistent that something is not right with patient. I have spoken to my charge nurse who has messaged the provider with the concerns. Patient does not currently have a medical doctor reviewing care. WBC's have trended slightly upwards. Labs still pending for this AM.

## 2021-04-21 NOTE — Progress Notes (Addendum)
Touched base this AM w/CCS MD Stechschulte regarding patient concerns; no BM since 7/8, abdomen firm/distended, pt reports bloating and "feels off". He is having intermittent abd pain, intermittent periods of SOB overnight and night sweats. CCS MD discussed all patient concerns with patient during bedside visit. MD denied need for NG at this time d/t no N/V.

## 2021-04-21 NOTE — Progress Notes (Signed)
PHARMACY - TOTAL PARENTERAL NUTRITION CONSULT NOTE   Indication: Prolonged ileus  Patient Measurements: Height: 5' 11.5" (181.6 cm) Weight: 103.9 kg (229 lb 0.9 oz) IBW/kg (Calculated) : 76.45   Body mass index is 31.5 kg/m. Usual Weight:   Assessment:  70 yo male s/p ventral hernia repair with mesh on 6/20. 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 He had a postoperative ileus for which he was on TPN. He also developed a pelvic abscess which was drained by IR on 7/6. His surgical retrorectus drain was removed on 7/7. Prior to discharge he was tolerating a diet and TPN was stopped. He was discharged home 7/8.  He was readmitted 7/9 with hypotension, AKI and leukocytosis.  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  On 04/16/21, he underwent coil embolization of the splenic artery.  He has not been tolerating diet, so Pharmacy has been consulted to start TPN.  Glucose / Insulin: No hx DM - CBGs controled on SSI q4h  - 3 required in past 24h Electrolytes: Na low 134, others WNL including CorrCa 9.48 Renal: BUN/SCr WNL, UOP 1L/24h Hepatic: WNL Prealb: 10.5 TG: 85 Intake / Output: net -570; 35ml drain output MIVF: none GI Imaging: GI Surgeries / Procedures: - 6/20: repair of recurrent ventral hernia with mesh - 6/27: retromuscular recurrent incisional hernia repair with vicral mesh, SB resection - 7/6: CT guided pelvic fluid collection drain placement - 7/9: coil embolization of splenic artery  Central access: plan PICC 7/13 TPN start date: plan 7/13  Nutritional Goals (updated RD recommendation on 7/13): kCal: 2250-2350, Protein: 115-130, Fluid: 2.4L/day Goal TPN rate is 100 mL/hr (provides 120 g of protein and 2347 kcals per  day)  Current Nutrition:  Clear liquids  Plan:  Increase TPN to 82mL/hr at 1800 - this will provide 90g protein, 1760kcal Electrolytes in TPN:  Na 72mEq/L - increase K 30mEq/L Ca 22mEq/L  Mg 56mEq/L  Phos 41mmol/L  Cl:Ac 1:1 Add standard MVI and trace elements to TPN Continue Sensitive q4h SSI and adjust as needed  IVF per MD - none currently Monitor TPN labs on Mon/Thurs, CMET/Mg/Phos in AM  Loralee Pacas, PharmD, BCPS Pharmacy: [phone] 04/21/2021,7:27 AM

## 2021-04-21 NOTE — Progress Notes (Signed)
Wife and daughter at bedside; family would like to discuss additional medical concerns w/MD Stechshulte that patient has in addition to reason for recent admission. Family wants to review goals of care/plan of care for current patient stay, MD reviewed with patient this AM at bedside but wife would like to be contacted for update. Made family aware on call MD would address emergencies and family/patient/RN could pass along request to MD to reach out on 7/15, verbalized understanding.   VSS at time of discussion, intermittent chills noted/MD aware this AM, T 98.6, afebrile at this time. Ice pack placed to abdomen after returning to bed from chair d/t discomfort at incision site. Incision clean, dry and intact.

## 2021-04-21 NOTE — Progress Notes (Signed)
Referring Physician(s): Stechschulte,P  Supervising Physician: Dr. Archer Asa  Patient Status:  [facility] - In-pt  Chief Complaint:  Left upper abdominal pain  Subjective: Patient familiar to IR service from recent pelvic fluid collection drainage on 7/6 followed by percutaneous coil embolization of splenic artery for spontaneous splenic rupture on 7/9 Feeling some better, slow progress but pain improved overall. Not much output from drain   Allergies: Versed [midazolam]  Medications:  Current Facility-Administered Medications:    (feeding supplement) PROSource Plus liquid 30 mL, 30 mL, Oral, Daily, Stechschulte, Hyman Hopes, MD, 30 mL at 04/20/21 2101   0.9 %  sodium chloride infusion, 250 mL, Intravenous, PRN, Karie Soda, MD   0.9 %  sodium chloride infusion, 250 mL, Intravenous, PRN, Karie Soda, MD, Stopped at 04/18/21 1120   acetaminophen (TYLENOL) tablet 1,000 mg, 1,000 mg, Oral, Q6H, Karie Soda, MD, 1,000 mg at 04/21/21 0910   alum & mag hydroxide-simeth (MAALOX/MYLANTA) 200-200-20 MG/5ML suspension 30 mL, 30 mL, Oral, Q6H PRN, Karie Soda, MD   bisacodyl (DULCOLAX) suppository 10 mg, 10 mg, Rectal, Q12H PRN, Karie Soda, MD   Chlorhexidine Gluconate Cloth 2 % PADS 6 each, 6 each, Topical, Q0600, Karie Soda, MD, 6 each at 04/20/21 1404   diphenhydrAMINE (BENADRYL) injection 12.5-25 mg, 12.5-25 mg, Intravenous, Q6H PRN, Karie Soda, MD   enoxaparin (LOVENOX) injection 40 mg, 40 mg, Subcutaneous, Q24H, Karie Soda, MD, 40 mg at 04/21/21 0910   feeding supplement (BOOST / RESOURCE BREEZE) liquid 1 Container, 1 Container, Oral, Q24H, Stechschulte, Hyman Hopes, MD, 1 Container at 04/20/21 1406   gabapentin (NEURONTIN) capsule 300 mg, 300 mg, Oral, TID, Karie Soda, MD, 300 mg at 04/21/21 0910   HYDROmorphone (DILAUDID) injection 0.5-2 mg, 0.5-2 mg, Intravenous, Q2H PRN, Karie Soda, MD, 2 mg at 04/20/21 2226   insulin aspart (novoLOG) injection 0-9 Units, 0-9 Units,  Subcutaneous, Q4H, Fritzi Mandes, MD, 1 Units at 04/21/21 0912   lip balm (CARMEX) ointment 1 application, 1 application, Topical, BID, Karie Soda, MD, 1 application at 04/21/21 0912   magic mouthwash, 15 mL, Oral, QID PRN, Karie Soda, MD   MEDLINE mouth rinse, 15 mL, Mouth Rinse, BID, Gross, Viviann Spare, MD, 15 mL at 04/20/21 2315   menthol-cetylpyridinium (CEPACOL) lozenge 3 mg, 1 lozenge, Oral, PRN, Karie Soda, MD   ondansetron [facility]) injection 4 mg, 4 mg, Intravenous, Q6H PRN, 4 mg at 04/19/21 0813 **OR** ondansetron (ZOFRAN) 8 mg in sodium chloride 0.9 % 50 mL IVPB, 8 mg, Intravenous, Q6H PRN, Karie Soda, MD   ondansetron (ZOFRAN-ODT) disintegrating tablet 4 mg, 4 mg, Oral, Q6H PRN **OR** [DISCONTINUED] ondansetron (ZOFRAN) injection 4 mg, 4 mg, Intravenous, Q6H PRN, Fritzi Mandes, MD   oxyCODONE (Oxy IR/ROXICODONE) immediate release tablet 5-10 mg, 5-10 mg, Oral, Q4H PRN, Karie Soda, MD, 10 mg at 04/21/21 0446   pantoprazole (PROTONIX) EC tablet 40 mg, 40 mg, Oral, BID, Sophronia Simas L, MD, 40 mg at 04/21/21 0910   phenol (CHLORASEPTIC) mouth spray 2 spray, 2 spray, Mouth/Throat, PRN, Karie Soda, MD   polycarbophil (FIBERCON) tablet 625 mg, 625 mg, Oral, BID, Karie Soda, MD, 625 mg at 04/21/21 0936   polyethylene glycol (MIRALAX / GLYCOLAX) packet 17 g, 17 g, Oral, Daily, Karie Soda, MD, 17 g at 04/21/21 0910   prochlorperazine (COMPAZINE) injection 5-10 mg, 5-10 mg, Intravenous, Q4H PRN, Karie Soda, MD, 10 mg at 04/17/21 1059   simethicone (MYLICON) 40 MG/0.6ML suspension 80 mg, 80 mg, Oral, QID PRN, Karie Soda, MD  sodium chloride flush (NS) 0.9 % injection 10-40 mL, 10-40 mL, Intracatheter, Q12H, Stechschulte, Hyman Hopes, MD, 10 mL at 04/21/21 0913   sodium chloride flush (NS) 0.9 % injection 10-40 mL, 10-40 mL, Intracatheter, PRN, Stechschulte, Hyman Hopes, MD   sodium chloride flush (NS) 0.9 % injection 3 mL, 3 mL, Intravenous, Q12H, Gross, Viviann Spare, MD, 3 mL at  04/18/21 2219   sodium chloride flush (NS) 0.9 % injection 3 mL, 3 mL, Intravenous, PRN, Karie Soda, MD   sodium chloride flush (NS) 0.9 % injection 3 mL, 3 mL, Intravenous, Q12H, Gross, Viviann Spare, MD, 3 mL at 04/20/21 0925   sodium chloride flush (NS) 0.9 % injection 3 mL, 3 mL, Intravenous, PRN, Karie Soda, MD   TPN ADULT (ION), , Intravenous, Continuous TPN, Rollene Fare, [facility], Last Rate: 40 mL/hr at 04/21/21 0447, Infusion Verify at 04/21/21 0447   TPN ADULT (ION), , Intravenous, Continuous TPN, Rollene Fare, RPH    Vital Signs: BP 126/85 (BP Location: Left Arm)   Pulse (!) 105   Temp 98.3 F (36.8 C) (Oral)   Resp (!) 22   Ht 5' 11.5" (1.816 m)   Wt 103.9 kg   SpO2 97%   BMI 31.50 kg/m   Physical Exam awake, alert.  Right transgluteal drain intact, minimal output yellow fluid, insertion site mildly tender. Drain flushes slowly, return of flush and minimal dark yellow fluid. Reconnected to bulb suction right common femoral artery access site soft, clean, dry, nontender, no hematoma; intact distal pulses.  Imaging: [place] EKG SITE RITE  Result Date: 04/19/2021 If Site Rite image not attached, placement could not be confirmed due to current cardiac rhythm.   Labs:  CBC: Recent Labs    04/18/21 0317 04/18/21 1427 04/19/21 0241 04/19/21 1255 04/20/21 0242 04/21/21 0233  WBC 18.5*  --  19.0*  --  19.8* 18.0*  HGB 7.4*   < > 7.0* 8.6* 7.3* 7.1*  HCT 23.0*   < > 22.0* 27.3* 23.6* 23.0*  PLT 445*  --  468*  --  512* 464*   < > = values in this interval not displayed.     COAGS: Recent Labs    08/12/20 0709 12/14/20 0903 04/13/21 1435 04/18/21 0711  INR 1.1 1.1 1.1 1.3*  APTT 30 30  --  34     BMP: Recent Labs    04/17/21 0216 04/18/21 0317 04/20/21 0242 04/21/21 0233  NA 134* 134* 134* 134*  K 4.5 3.8 3.8 4.0  CL 103 101 99 99  CO2 25 27 27 28   GLUCOSE 117* 106* 105* 126*  BUN 16 9 8 10   CALCIUM 7.9* 8.0* 8.1* 8.2*  CREATININE 0.83 0.87  0.62 0.67  GFRNONAA >60 >60 >60 >60     LIVER FUNCTION TESTS: Recent Labs    04/14/21 0421 04/15/21 2343 04/20/21 0242 04/21/21 0233  BILITOT 0.4 0.4 0.5 0.7  AST 22 25 12* 16  ALT 21 26 10 13   ALKPHOS 74 78 58 71  PROT 6.7 7.7 6.3* 6.5  ALBUMIN 2.6* 3.2* 2.4* 2.4*     Assessment and Plan: 70 y.o. male who is status post retrovascular recurrent incisional hernia repair with Vicryl mesh, reconstruction of the posterior layer and Phasix absorbable retrovascular mesh, and small bowel resection on 04/04/2021; now with post op pelvic/rectovesical pouch fluid collection, status post drain placement on 7/6; status post coil embolization of splenic artery for spontaneous splenic rupture on 7/9;   Minimal output from TG drain past  few days. Left in place to facilitate drainage of any fluid/blood products that descend to pelvis but hasn't been anything for a few days. Will discuss with attending IR timing of repeat imaging and consideration of drain removal.  Electronically Signed: Brayton El, PA-C 04/21/2021, 10:02 AM   I spent a total of 15 Minutes at the the patient's bedside AND on the patient's hospital floor or unit, greater than 50% of which was counseling/coordinating care for pelvic fluid collection drain and splenic artery coil embolization

## 2021-04-21 NOTE — Progress Notes (Addendum)
Progress Note: General Surgery Service   Chief Complaint/Subjective: Passing flatus, no bowel movements.  Feels full with just a little clear liquids. Some sweats/chills last night.  No fever recorded.  Using incentive spirometer.  Pain left upper quadrant.  Objective: Vital signs in last 24 hours: Temp:  [98 F (36.7 C)-99.2 F (37.3 C)] 98.3 F (36.8 C) (07/14 0400) Pulse Rate:  [95-111] 102 (07/14 0454) Resp:  [8-20] 19 (07/14 0454) BP: (130-145)/(71-91) 137/71 (07/14 0454) SpO2:  [88 %-99 %] 97 % (07/14 0454) Last BM Date: 04/15/21  Intake/Output from previous day: 07/13 0701 - 07/14 0700 In: 444.9 [I.V.:434.9] Out: 1015 [Urine:1000; Drains:15] Intake/Output this shift: No intake/output data recorded.  GI: Abd Distended, incision c/d/I w/ staples healing, JP drain out of buttock with no drainage   Lab Results: CBC  Recent Labs    04/20/21 0242 04/21/21 0233  WBC 19.8* 18.0*  HGB 7.3* 7.1*  HCT 23.6* 23.0*  PLT 512* 464*    BMET Recent Labs    04/20/21 0242 04/21/21 0233  NA 134* 134*  K 3.8 4.0  CL 99 99  CO2 27 28  GLUCOSE 105* 126*  BUN 8 10  CREATININE 0.62 0.67  CALCIUM 8.1* 8.2*    PT/INR No results for input(s): LABPROT, INR in the last 72 hours.  ABG No results for input(s): PHART, HCO3 in the last 72 hours.  Invalid input(s): PCO2, PO2  Anti-infectives: Anti-infectives (From admission, onward)    Start     Dose/Rate Route Frequency Ordered Stop   04/16/21 1800  piperacillin-tazobactam (ZOSYN) IVPB 3.375 g  Status:  Discontinued       Note to Pharmacy: Pharmacy may adjust dosing strength, schedule, rate of infusion, etc as needed to optimize therapy   3.375 g 12.5 mL/hr over 240 Minutes Intravenous Every 8 hours 04/16/21 1640 04/18/21 1014   04/16/21 1730  piperacillin-tazobactam (ZOSYN) IVPB 3.375 g  Status:  Discontinued       Note to Pharmacy: Pharmacy may adjust dosing strength, schedule, rate of infusion, etc as needed to optimize  therapy   3.375 g 12.5 mL/hr over 240 Minutes Intravenous Every 8 hours 04/16/21 1640 04/16/21 1640   04/16/21 1000  cefTRIAXone (ROCEPHIN) 2 g in sodium chloride 0.9 % 100 mL IVPB  Status:  Discontinued       Note to Pharmacy: Pharmacy may adjust dosing strength / duration / interval for maximal efficacy   2 g 200 mL/hr over 30 Minutes Intravenous Every 24 hours 04/16/21 0828 04/16/21 1640   04/16/21 1000  metroNIDAZOLE (FLAGYL) IVPB 500 mg  Status:  Discontinued        500 mg 100 mL/hr over 60 Minutes Intravenous Every 6 hours 04/16/21 0828 04/16/21 1640   04/16/21 0100  piperacillin-tazobactam (ZOSYN) IVPB 3.375 g        3.375 g 12.5 mL/hr over 240 Minutes Intravenous  Once 04/16/21 0057 04/16/21 0143       Medications: Scheduled Meds:  (feeding supplement) PROSource Plus  30 mL Oral Daily   acetaminophen  1,000 mg Oral Q6H   Chlorhexidine Gluconate Cloth  6 each Topical Q0600   enoxaparin (LOVENOX) injection  40 mg Subcutaneous Q24H   feeding supplement  1 Container Oral Q24H   gabapentin  300 mg Oral TID   insulin aspart  0-9 Units Subcutaneous Q4H   lip balm  1 application Topical BID   mouth rinse  15 mL Mouth Rinse BID   pantoprazole  40 mg Oral BID  polycarbophil  625 mg Oral BID   polyethylene glycol  17 g Oral Daily   sodium chloride flush  10-40 mL Intracatheter Q12H   sodium chloride flush  3 mL Intravenous Q12H   sodium chloride flush  3 mL Intravenous Q12H   Continuous Infusions:  sodium chloride     sodium chloride Stopped (04/18/21 1120)   ondansetron (ZOFRAN) IV     TPN ADULT (ION) 40 mL/hr at 04/21/21 0447   TPN ADULT (ION)     PRN Meds:.sodium chloride, sodium chloride, alum & mag hydroxide-simeth, bisacodyl, diphenhydrAMINE, HYDROmorphone (DILAUDID) injection, magic mouthwash, menthol-cetylpyridinium, ondansetron (ZOFRAN) IV **OR** ondansetron (ZOFRAN) IV, ondansetron **OR** [DISCONTINUED] ondansetron (ZOFRAN) IV, oxyCODONE, phenol, prochlorperazine,  simethicone, sodium chloride flush, sodium chloride flush, sodium chloride flush  Assessment/Plan: Mr. Madlock is a 70 year old male who underwent recurrent incisional hernia repair with removal of previous intraperitoneal mesh and rives stoppa style repair.  His posterior layer broke down where the previous mesh had been removed requiring takeback to the operating room for diagnostic laparotomy converted to exploratory laparotomy, small bowel resection with vicryl mesh reconstruction of the posterior layer and phasix retromuscular mesh placement on 04/04/2021. He recovered from this in the hospital, with a prolonged postoperative ileus which was not unexpected due to the large interparietal hernia.  A CT guided drain was placed on 04/13/21, to drain a pelvic fluid collection.  He was discharged on 04/15/21.  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  On 04/16/21, he underwent coil embolization of the splenic artery.  He is now recovering in the step-down unit with improving tachycardia and stabilizing hemoglobin.  Not tolerating diet Sips of CLD, TPN IR plans to keep drain for now - blood may eventually drain to pelvis HGB stabilizing, transition to daily HGB Leukocytosis secondary to demarginalization from splenic embolization, not a reliable marker for infection.  Reassuring that patient is passing some flatus and has not recorded a fever.  Likely some atelectasis from abdominal pain and immobility - keep working on incentive spirometer.  Mobilize as able. EBV pending    LOS: 5 days   FEN: Sips of CLD, TPN ID: None VTE: SCDs, lovenox Foley: None Dispo: Continued care in stepdown    Quentin Ore, MD  [facility] Use [email or web address] to contact on call provider

## 2021-04-21 NOTE — Progress Notes (Signed)
After discussion with Dr. Archer Asa and review of imaging, feel pelvic collection is resolved. Given lack of output past several days, feel drain removal appropriate at this time. Drain removed at bedside without difficulty.  Brayton El PA-C Interventional Radiology 04/21/2021 1:25 PM

## 2021-04-22 LAB — GLUCOSE, CAPILLARY
Glucose-Capillary: 119 mg/dL — ABNORMAL HIGH (ref 70–99)
Glucose-Capillary: 126 mg/dL — ABNORMAL HIGH (ref 70–99)
Glucose-Capillary: 137 mg/dL — ABNORMAL HIGH (ref 70–99)
Glucose-Capillary: 140 mg/dL — ABNORMAL HIGH (ref 70–99)
Glucose-Capillary: 144 mg/dL — ABNORMAL HIGH (ref 70–99)
Glucose-Capillary: 145 mg/dL — ABNORMAL HIGH (ref 70–99)
Glucose-Capillary: 152 mg/dL — ABNORMAL HIGH (ref 70–99)

## 2021-04-22 LAB — COMPREHENSIVE METABOLIC PANEL
ALT: 12 U/L (ref 0–44)
AST: 17 U/L (ref 15–41)
Albumin: 2.4 g/dL — ABNORMAL LOW (ref 3.5–5.0)
Alkaline Phosphatase: 67 U/L (ref 38–126)
Anion gap: 9 (ref 5–15)
BUN: 10 mg/dL (ref 8–23)
CO2: 27 mmol/L (ref 22–32)
Calcium: 8.6 mg/dL — ABNORMAL LOW (ref 8.9–10.3)
Chloride: 99 mmol/L (ref 98–111)
Creatinine, Ser: 0.46 mg/dL — ABNORMAL LOW (ref 0.61–1.24)
GFR, Estimated: >60 mL/min (ref >60)
Glucose, Bld: 125 mg/dL — ABNORMAL HIGH (ref 70–99)
Potassium: 4.1 mmol/L (ref 3.5–5.1)
Sodium: 135 mmol/L (ref 135–145)
Total Bilirubin: 0.6 mg/dL (ref 0.3–1.2)
Total Protein: 6.7 g/dL (ref 6.5–8.1)

## 2021-04-22 LAB — EPSTEIN-BARR VIRUS (EBV) ANTIBODY PROFILE
EBV NA IgG: 516 U/mL — ABNORMAL HIGH (ref 0.0–17.9)
EBV VCA IgG: 150 U/mL — ABNORMAL HIGH (ref 0.0–17.9)
EBV VCA IgM: <36 U/mL (ref 0.0–35.9)

## 2021-04-22 LAB — PHOSPHORUS: Phosphorus: 3.4 mg/dL (ref 2.5–4.6)

## 2021-04-22 LAB — MAGNESIUM: Magnesium: 2 mg/dL (ref 1.7–2.4)

## 2021-04-22 MED ORDER — TRAVASOL 10 % IV SOLN
INTRAVENOUS | Status: AC
  Filled 2021-04-22: qty 1080

## 2021-04-22 NOTE — Progress Notes (Addendum)
PHARMACY - TOTAL PARENTERAL NUTRITION CONSULT NOTE   Indication: Prolonged ileus  Patient Measurements: Height: 5' 11.5" (181.6 cm) Weight: 103.9 kg (229 lb 0.9 oz) IBW/kg (Calculated) : 76.45   Body mass index is 31.5 kg/m. Usual Weight:   Recent Labs    04/20/21 0242 04/21/21 0233 04/22/21 0405  NA 134* 134* 135  K 3.8 4.0 4.1  CL 99 99 99  CO2 27 28 27   GLUCOSE 105* 126* 125*  BUN 8 10 10   CREATININE 0.62 0.67 0.46*  CALCIUM 8.1* 8.2* 8.6*  PHOS 3.0 3.5 3.4  MG 1.8 2.0 2.0  ALBUMIN 2.4* 2.4* 2.4*  ALKPHOS 58 71 67  AST 12* 16 17  ALT 10 13 12   BILITOT 0.5 0.7 0.6  TRIG 85  --   --   PREALBUMIN 10.5*  --   --   7/15 CorrCa 9.9     Assessment:  70 yo male s/p ventral hernia repair with mesh on 6/20. 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 He had a postoperative ileus for which he was on TPN. He also developed a pelvic abscess which was drained by IR on 7/6. His surgical retrorectus drain was removed on 7/7. Prior to discharge he was tolerating a diet and TPN was stopped. He was discharged home 7/8.  He was readmitted 7/9 with hypotension, AKI and leukocytosis.  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  On 04/16/21, he underwent coil embolization of the splenic artery.  He has not been tolerating diet, so Pharmacy has been consulted to start TPN.  Glucose / Insulin: No hx DM - CBGs 105 - 250 on SSI q4h  - 6 units required in past 24h Electrolytes: WNL  Renal: BUN, SCr stable; UOP recorded as 550 Hepatic: WNL Prealb: 10.5 TG: 85 Intake / Output: net +210 MIVF: none GI Imaging: GI Surgeries / Procedures: - 6/20: repair of recurrent ventral hernia with mesh - 6/27: retromuscular recurrent incisional hernia repair  with vicral mesh, SB resection - 7/6: CT guided pelvic fluid collection drain placement - 7/9: coil embolization of splenic artery -7/14: pelvic drain removed at bedside  Central access: PICC 7/13 TPN start date: 7/13  Nutritional Goals (updated RD recommendation on 7/13): kCal: 2250-2350, Protein: 115-130, Fluid: 2.4L/day Goal TPN rate is 100 mL/hr (provides 120 g of protein and 2347 kcals per day)  Current Nutrition:  Clear liquids Boost / Resource Breeze q24h ProSource 30 mL daily  Plan:  Increase TPN to 90 mL/hr at 1800 - this will provide 108 g protein, 2112 kcal Electrolytes in TPN:  Na 48mEq/L  K 80mEq/L Ca 72mEq/L  Mg 1mEq/L  Phos 43mmol/L  Cl:Ac 1:1 Add standard MVI and trace elements to TPN Continue Sensitive q4h SSI and adjust as needed  IVF per MD - none currently Monitor TPN labs on Mon/Thurs,  CMET/Mg/Phos in AM  4m, PharmD, BCPS 04/22/2021  7:28 AM

## 2021-04-22 NOTE — Progress Notes (Signed)
Progress Note: General Surgery Service   Chief Complaint/Subjective: Passing flatus, no bowel movements.  Feels full with just a little clear liquids. Some sweats/chills last night, similar to previous.  No fever recorded.  Using incentive spirometer.  Pain left upper quadrant.  Frustrated by not making any visible progress.  Frustrated being in the hospital still.  Objective: Vital signs in last 24 hours: Temp:  [98.1 F (36.7 C)-98.7 F (37.1 C)] 98.6 F (37 C) (07/15 1130) Pulse Rate:  [93-110] 93 (07/15 0400) Resp:  [14-20] 16 (07/15 0400) BP: (128-161)/(74-81) 147/74 (07/15 0400) SpO2:  [91 %-98 %] 98 % (07/15 0400) Last BM Date: 04/15/21  Intake/Output from previous day: 07/14 0701 - 07/15 0700 In: 2011 [P.O.:720; I.V.:1286] Out: 1180 [Urine:1175; Drains:5] Intake/Output this shift: No intake/output data recorded.  GI: Abd slightly less distended today, incision c/d/I, staples removed on exam today, JP drain out of buttock with no drainage   Lab Results: CBC  Recent Labs    04/20/21 0242 04/21/21 0233  WBC 19.8* 18.0*  HGB 7.3* 7.1*  HCT 23.6* 23.0*  PLT 512* 464*    BMET Recent Labs    04/21/21 0233 04/22/21 0405  NA 134* 135  K 4.0 4.1  CL 99 99  CO2 28 27  GLUCOSE 126* 125*  BUN 10 10  CREATININE 0.67 0.46*  CALCIUM 8.2* 8.6*    PT/INR No results for input(s): LABPROT, INR in the last 72 hours.  ABG No results for input(s): PHART, HCO3 in the last 72 hours.  Invalid input(s): PCO2, PO2  Anti-infectives: Anti-infectives (From admission, onward)    Start     Dose/Rate Route Frequency Ordered Stop   04/16/21 1800  piperacillin-tazobactam (ZOSYN) IVPB 3.375 g  Status:  Discontinued       Note to Pharmacy: Pharmacy may adjust dosing strength, schedule, rate of infusion, etc as needed to optimize therapy   3.375 g 12.5 mL/hr over 240 Minutes Intravenous Every 8 hours 04/16/21 1640 04/18/21 1014   04/16/21 1730  piperacillin-tazobactam (ZOSYN)  IVPB 3.375 g  Status:  Discontinued       Note to Pharmacy: Pharmacy may adjust dosing strength, schedule, rate of infusion, etc as needed to optimize therapy   3.375 g 12.5 mL/hr over 240 Minutes Intravenous Every 8 hours 04/16/21 1640 04/16/21 1640   04/16/21 1000  cefTRIAXone (ROCEPHIN) 2 g in sodium chloride 0.9 % 100 mL IVPB  Status:  Discontinued       Note to Pharmacy: Pharmacy may adjust dosing strength / duration / interval for maximal efficacy   2 g 200 mL/hr over 30 Minutes Intravenous Every 24 hours 04/16/21 0828 04/16/21 1640   04/16/21 1000  metroNIDAZOLE (FLAGYL) IVPB 500 mg  Status:  Discontinued        500 mg 100 mL/hr over 60 Minutes Intravenous Every 6 hours 04/16/21 0828 04/16/21 1640   04/16/21 0100  piperacillin-tazobactam (ZOSYN) IVPB 3.375 g        3.375 g 12.5 mL/hr over 240 Minutes Intravenous  Once 04/16/21 0057 04/16/21 0143       Medications: Scheduled Meds:  (feeding supplement) PROSource Plus  30 mL Oral Daily   acetaminophen  1,000 mg Oral Q6H   Chlorhexidine Gluconate Cloth  6 each Topical Q0600   enoxaparin (LOVENOX) injection  40 mg Subcutaneous Q24H   feeding supplement  1 Container Oral Q24H   gabapentin  300 mg Oral TID   insulin aspart  0-9 Units Subcutaneous Q4H   lip  balm  1 application Topical BID   mouth rinse  15 mL Mouth Rinse BID   pantoprazole  40 mg Oral BID   polycarbophil  625 mg Oral BID   polyethylene glycol  17 g Oral Daily   sodium chloride flush  10-40 mL Intracatheter Q12H   sodium chloride flush  3 mL Intravenous Q12H   sodium chloride flush  3 mL Intravenous Q12H   Continuous Infusions:  sodium chloride     sodium chloride Stopped (04/18/21 1120)   ondansetron (ZOFRAN) IV     TPN ADULT (ION) 75 mL/hr at 04/22/21 0414   TPN ADULT (ION)     PRN Meds:.sodium chloride, sodium chloride, alum & mag hydroxide-simeth, bisacodyl, diphenhydrAMINE, HYDROmorphone (DILAUDID) injection, magic mouthwash, menthol-cetylpyridinium,  ondansetron (ZOFRAN) IV **OR** ondansetron (ZOFRAN) IV, ondansetron **OR** [DISCONTINUED] ondansetron (ZOFRAN) IV, oxyCODONE, phenol, prochlorperazine, simethicone, sodium chloride flush, sodium chloride flush, sodium chloride flush  Assessment/Plan: Brian Atkinson is a 70 year old male who underwent recurrent incisional hernia repair with removal of previous intraperitoneal mesh and rives stoppa style repair.  His posterior layer broke down where the previous mesh had been removed requiring takeback to the operating room for diagnostic laparotomy converted to exploratory laparotomy, small bowel resection with vicryl mesh reconstruction of the posterior layer and phasix retromuscular mesh placement on 04/04/2021. He recovered from this in the hospital, with a prolonged postoperative ileus which was not unexpected due to the large interparietal hernia.  A CT guided drain was placed on 04/13/21, to drain a pelvic fluid collection.  He was discharged on 04/15/21.  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  On 04/16/21, he underwent coil embolization of the splenic artery.  He is now recovering in the step-down unit with improving tachycardia and stabilizing hemoglobin.  Not tolerating diet Sips of CLD, TPN IR plans to keep drain for now - blood may eventually drain to pelvis HGB stabilizing, transition to daily HGB Leukocytosis secondary to demarginalization from splenic embolization, not a reliable marker for infection.  Reassuring that patient is passing some flatus and has not recorded a fever.  Likely some atelectasis from abdominal pain and immobility - keep working on incentive spirometer.  Mobilize as able. 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 Use [email or web address] to contact on call provider

## 2021-04-23 ENCOUNTER — Inpatient Hospital Stay (HOSPITAL_COMMUNITY): Payer: Medicare Other

## 2021-04-23 ENCOUNTER — Encounter (HOSPITAL_COMMUNITY): Payer: Self-pay

## 2021-04-23 LAB — GLUCOSE, CAPILLARY
Glucose-Capillary: 129 mg/dL — ABNORMAL HIGH (ref 70–99)
Glucose-Capillary: 135 mg/dL — ABNORMAL HIGH (ref 70–99)
Glucose-Capillary: 133 mg/dL — ABNORMAL HIGH (ref 70–99)
Glucose-Capillary: 126 mg/dL — ABNORMAL HIGH (ref 70–99)
Glucose-Capillary: 138 mg/dL — ABNORMAL HIGH (ref 70–99)
Glucose-Capillary: 117 mg/dL — ABNORMAL HIGH (ref 70–99)

## 2021-04-23 LAB — COMPREHENSIVE METABOLIC PANEL
ALT: 19 U/L (ref 0–44)
AST: 28 U/L (ref 15–41)
Albumin: 2.4 g/dL — ABNORMAL LOW (ref 3.5–5.0)
Alkaline Phosphatase: 82 U/L (ref 38–126)
Anion gap: 6 (ref 5–15)
BUN: 12 mg/dL (ref 8–23)
CO2: 27 mmol/L (ref 22–32)
Calcium: 8.2 mg/dL — ABNORMAL LOW (ref 8.9–10.3)
Chloride: 101 mmol/L (ref 98–111)
Creatinine, Ser: 0.57 mg/dL — ABNORMAL LOW (ref 0.61–1.24)
GFR, Estimated: >60 mL/min (ref >60)
Glucose, Bld: 127 mg/dL — ABNORMAL HIGH (ref 70–99)
Potassium: 4.2 mmol/L (ref 3.5–5.1)
Sodium: 134 mmol/L — ABNORMAL LOW (ref 135–145)
Total Bilirubin: 0.6 mg/dL (ref 0.3–1.2)
Total Protein: 6.5 g/dL (ref 6.5–8.1)

## 2021-04-23 LAB — CBC
HCT: 23.5 % — ABNORMAL LOW (ref 39.0–52.0)
Hemoglobin: 7.1 g/dL — ABNORMAL LOW (ref 13.0–17.0)
MCH: 27.2 pg (ref 26.0–34.0)
MCHC: 30.2 g/dL (ref 30.0–36.0)
MCV: 90 fL (ref 80.0–100.0)
Platelets: 472 10*3/uL — ABNORMAL HIGH (ref 150–400)
RBC: 2.61 MIL/uL — ABNORMAL LOW (ref 4.22–5.81)
RDW: 15.4 % (ref 11.5–15.5)
WBC: 16.2 10*3/uL — ABNORMAL HIGH (ref 4.0–10.5)
nRBC: 0.1 % (ref 0.0–0.2)

## 2021-04-23 LAB — MAGNESIUM: Magnesium: 1.9 mg/dL (ref 1.7–2.4)

## 2021-04-23 LAB — PHOSPHORUS: Phosphorus: 3.8 mg/dL (ref 2.5–4.6)

## 2021-04-23 MED ORDER — TRAVASOL 10 % IV SOLN
INTRAVENOUS | Status: AC
  Filled 2021-04-23: qty 1200

## 2021-04-23 MED ORDER — MAGNESIUM SULFATE IN D5W 1-5 GM/100ML-% IV SOLN
1.0000 g | Freq: Once | INTRAVENOUS | Status: AC
  Administered 2021-04-23: 1 g via INTRAVENOUS
  Filled 2021-04-23: qty 100

## 2021-04-23 MED ORDER — IOHEXOL 350 MG/ML SOLN
80.0000 mL | Freq: Once | INTRAVENOUS | Status: AC | PRN
  Administered 2021-04-23: 80 mL via INTRAVENOUS

## 2021-04-23 NOTE — Consult Note (Signed)
[facility] Medical Consultation  Brian Atkinson FAO:[id number] DOB: 03/27/1951 DOA: 04/15/2021 PCP: Shelly Rubensteinh Park, Angela J, MD   Requesting physician: Dr. Luisa Hartornett General surgery Date of consultation: 04/23/2021 Reason for consultation: Medical management of possible pneumonia  Impression/Recommendations Principal Problem:   Nontraumatic splenic rupture s/p IR embolization 04/15/2021 Active Problems:   Malnutrition of moderate degree   Dehydration   Acute kidney injury (HCC)   Hypotension   Leukocytosis    #1 left lower lobe pneumonia: Per chest x-ray.  We will pursue CT angiogram of the chest to rule out PE.  If pneumonia confirmed will initiate treatment for HCAP.  Patient does not appear to be hypoxic at this point.  He has history of MRSA infection in the past so we will cover with vancomycin and cefepime if confirmed.  #2 splenic rupture: Status post embolization.  Continue per surgery  #3 transient hypotension: Appears resolved.  #4 abdominal ileus: No bowel movement but a lot of gas and abdominal distention.  Supportive care only.  #5 AKI: Largely resolved.  #6 sinus tachycardia: Continue to monitor  #7 anemia: Patient may be having symptomatic anemia.  Hemoglobin 7.1.  Recheck in the morning but she will consider blood transfusion especially with tachycardia and worsening symptoms.  #8 leukocytosis: Most likely secondary to infection versus inflammation.  Continue to monitor  I will followup again tomorrow. Please contact me if I can be of assistance in the meanwhile. Thank you for this consultation.  Chief Complaint: Pleuritic chest pain  HPI:  Patient with complex medical history with recurrent incisional hernia which was repaired about a month ago with intraperitoneal mesh later complicated about 8 days ago with spontaneous nontraumatic splenic rupture for which the patient was admitted and embolization was performed.  Patient recovered in the ICU but today  complaining of pleuritic chest pain and some shortness of breath.  Chest x-ray suggested possible pneumonia.  CT angiogram of the chest has been ordered but family are requesting medical consultation so patient is being seen by the medical service for medical consultation of possible pneumonia.  The patient describes his pleuritic chest pain only.  No fever or chills no nausea vomiting or diarrhea.  Review of Systems:  General: No fever or chills Respiratory: Shortness of breath with mild cough and pleuritic chest pain Cardiac: Chest pain and shortness of breath GI: Distended abdomen with no bowel movement but passing gas.  Past Medical History:  Diagnosis Date   Acute osteomyelitis of patella (HCC)    Complication of anesthesia    post-op memory problems no versed   History of MRSA infection    Medical history non-contributory    Patella fracture    left    Past Surgical History:  Procedure Laterality Date   BONE EXCISION Left 09/25/2017   Procedure: PARTIAL EXCISION OF LEFT PATELLA;  Surgeon: Myrene GalasHandy, Michael, MD;  Location: [facility] OR;  Service: Orthopedics;  Laterality: Left;   CATARACT EXTRACTION W/ INTRAOCULAR LENS  IMPLANT, BILATERAL     COLONOSCOPY     DEBRIDEMENT AND CLOSURE WOUND Left 06/19/2014   Procedure: DEBRIDEMENT AND CLOSURE LEFT KNEE;  Surgeon: Kathryne Hitchhristopher Y Blackman, MD;  Location: [facility] ORS;  Service: Orthopedics;  Laterality: Left;   EXTERNAL FIXATION LEG Left 07/23/2020   Procedure: EXTERNAL FIXATION LEG;  Surgeon: Yolonda Kidaogers, Jason Patrick, MD;  [facility];  Service: Orthopedics;  Laterality: Left;   EXTERNAL FIXATION LEG Left 07/27/2020   Procedure: Adjustment of external fixator left leg;  Surgeon: Myrene GalasHandy, Michael,  MD;  Location: [facility] OR;  Service: Orthopedics;  Laterality: Left;   EXTERNAL FIXATION REMOVAL Left 08/12/2020   Procedure: REMOVAL EXTERNAL FIXATION LEG;  Surgeon: Myrene Galas, MD;  Location: [facility] OR;  Service: Orthopedics;  Laterality: Left;   FOREIGN BODY  REMOVAL N/A 03/28/2021   Procedure: OPEN EXCISION OF INTRABDOMINAL MESH;  Surgeon: Quentin Ore, MD;  Location: [facility];  Service: General;  Laterality: N/A;   IR ANGIOGRAM SELECTIVE EACH ADDITIONAL VESSEL  04/16/2021   IR ANGIOGRAM VISCERAL SELECTIVE  04/16/2021   IR EMBO ART  VEN HEMORR LYMPH EXTRAV  INC GUIDE ROADMAPPING  04/16/2021   IR US GUIDE VASC ACCESS RIGHT  04/16/2021   IRRIGATION AND DEBRIDEMENT KNEE Left 06/17/2014   Procedure: IRRIGATION AND DEBRIDEMENT KNEE;  Surgeon: Budd Palmer, MD;  Location: [facility];  Service: Orthopedics;  Laterality: Left;   KNEE BURSECTOMY Left 05/27/2015   KNEE BURSECTOMY Left 05/27/2015   Procedure: KNEE BURSECTOMY, suture removal patellar tendon;  Surgeon: Myrene Galas, MD;  Location[facility] OR;  Service: Orthopedics;  Laterality: Left;   LAPAROSCOPY N/A 04/04/2021   Procedure: Diagnostic laparotomy, exploratory laparotomy, small bowel resection with mesh;  Surgeon: Quentin Ore, MD;  Location: [facility];  Service: General;  Laterality: N/A;   ORIF PATELLA Left 06/12/2014   Procedure: OPEN REDUCTION INTERNAL (ORIF) FIXATION PATELLA;  Surgeon: Budd Palmer, MD;  Location: [facility] OR;  Service: Orthopedics;  Laterality: Left;   ORIF TIBIA PLATEAU Left 08/12/2020   ORIF TIBIA PLATEAU Left 08/12/2020   Procedure: OPEN REDUCTION INTERNAL FIXATION (ORIF) BICONDYLAR TIBIAL PLATEAU;  Surgeon: Myrene Galas, MD;  Location: [facility] OR;  Service: Orthopedics;  Laterality: Left;   ORIF TIBIA PLATEAU Left 12/14/2020   Procedure: TIBIA REPAIR USING ILLIAC CREST BONE GRAFT VERSUS REAMED INTRAMEDULLARY ASPIRATE;  Surgeon: Myrene Galas, MD;  Location: [facility] OR;  Service: Orthopedics;  Laterality: Left;   REFRACTIVE SURGERY Bilateral    TONSILLECTOMY     VENTRAL HERNIA REPAIR N/A 03/28/2021   Procedure: REPAIR OF RECURRENT VENTRAL HERNIA WITH MESH;  Surgeon: Stechschulte, Hyman Hopes, MD;  Location: [facility] OR[facility];  Service: General;  Laterality: N/A;   Social History:  reports that he has never  smoked. He has never used smokeless tobacco. He reports previous alcohol use. He reports that he does not use drugs.  Allergies  Allergen Reactions   Versed [Midazolam] Other (See Comments)    Memory problems   History reviewed. No pertinent family history.  Prior to Admission medications   Medication Sig Start Date End Date Taking? Authorizing Provider  acetaminophen (TYLENOL) 500 MG tablet Take 2 tablets (1,000 mg total) by mouth every 6 (six) hours as needed for mild pain or fever. 04/15/21   Juliet Rude, PA-C  cetirizine (ZYRTEC) 10 MG tablet Take 10 mg by mouth daily as needed for allergies.    [provider]  docusate sodium (COLACE) 100 MG capsule Take 1 capsule (100 mg total) by mouth 2 (two) times daily. 04/15/21   Juliet Rude, PA-C  gabapentin (NEURONTIN) 300 MG capsule Take 1 capsule (300 mg total) by mouth 3 (three) times daily. 04/15/21   Juliet Rude, PA-C  Multiple Vitamins-Minerals (ONE-A-DAY MENS 50+) TABS Take 1 tablet by mouth daily.    [provider]  oxyCODONE (OXY IR/ROXICODONE) 5 MG immediate release tablet Take 1 tablet (5 mg total) by mouth every 4 (four) hours as needed for moderate pain. 04/15/21   Juliet Rude, PA-C  pantoprazole (PROTONIX) 40  MG tablet Take 1 tablet (40 mg total) by mouth 2 (two) times daily. 04/15/21   Juliet Rude, PA-C  polyethylene glycol (MIRALAX / GLYCOLAX) 17 g packet Take 17 g by mouth daily. 04/15/21   Juliet Rude, PA-C  sodium chloride flush (NS) 0.9 % SOLN 5 mLs by Intracatheter route daily. 04/15/21   Juliet Rude, PA-C  enoxaparin (LOVENOX) 40 MG/0.4ML injection Inject 0.4 mLs (40 mg total) into the skin daily. 07/28/20 08/16/20  Montez Morita, PA-C  ferrous sulfate 325 (65 FE) MG tablet Take 1 tablet (325 mg total) by mouth 3 (three) times daily with meals. Patient not taking: Reported on 12/09/2020 07/28/20 12/17/20  Montez Morita, PA-C  rivaroxaban (XARELTO) 10 MG TABS tablet TAKE 1 TABLET (10 MG TOTAL)  BY MOUTH DAILY. Patient not taking: No sig reported 12/17/20 04/15/21  Montez Morita, PA-C   Physical Exam: Blood pressure 138/62, pulse 94, temperature 98.3 F (36.8 C), temperature source Axillary, resp. rate 16, height 5' 11.5" (1.816 m), weight 103.9 kg, SpO2 99 %. Vitals:   04/23/21 1640 04/23/21 1700  BP:    Pulse:  94  Resp:  16  Temp: 98.3 F (36.8 C)   SpO2:  99%    General: Stable no distress Eyes: PERRLA  ENT: EOMI Neck: Supple no JVD or lymphadenopathy Cardiovascular: Sinus tachycardia, no murmurs Respiratory: Good air entry bilaterally with some mild crackles Abdomen: Distended abdomen tympanitic, mild diffuse tenderness Skin: No rashes no ulcers Musculoskeletal: No deformities Psychiatric: No depression not suicidal, fully awake and alert Neurologic: Nonfocal  Labs on Admission:  Basic Metabolic Panel: Recent Labs  Lab 04/18/21 0317 04/20/21 0242 04/21/21 0233 04/22/21 0405 04/23/21 0500  NA 134* 134* 134* 135 134*  K 3.8 3.8 4.0 4.1 4.2  CL 101 99 99 99 101  CO2 27 27 28 27 27   GLUCOSE 106* 105* 126* 125* 127*  BUN 9 8 10 10 12   CREATININE 0.87 0.62 0.67 0.46* 0.57*  CALCIUM 8.0* 8.1* 8.2* 8.6* 8.2*  MG  --  1.8 2.0 2.0 1.9  PHOS  --  3.0 3.5 3.4 3.8   Liver Function Tests: Recent Labs  Lab 04/20/21 0242 04/21/21 0233 04/22/21 0405 04/23/21 0500  AST 12* 16 17 28   ALT 10 13 12 19   ALKPHOS 58 71 67 82  BILITOT 0.5 0.7 0.6 0.6  PROT 6.3* 6.5 6.7 6.5  ALBUMIN 2.4* 2.4* 2.4* 2.4*   No results for input(s): LIPASE, AMYLASE in the last 168 hours. No results for input(s): AMMONIA in the last 168 hours. CBC: Recent Labs  Lab 04/18/21 0317 04/18/21 1427 04/19/21 0241 04/19/21 1255 04/20/21 0242 04/21/21 0233 04/23/21 0500  WBC 18.5*  --  19.0*  --  19.8* 18.0* 16.2*  NEUTROABS  --   --   --   --  15.8*  --   --   HGB 7.4*   < > 7.0* 8.6* 7.3* 7.1* 7.1*  HCT 23.0*   < > 22.0* 27.3* 23.6* 23.0* 23.5*  MCV 87.8  --  88.0  --  90.8 89.1 90.0   PLT 445*  --  468*  --  512* 464* 472*   < > = values in this interval not displayed.   Cardiac Enzymes: No results for input(s): CKTOTAL, CKMB, CKMBINDEX, TROPONINI in the last 168 hours. BNP: Invalid input(s): POCBNP CBG: Recent Labs  Lab 04/22/21 2306 04/23/21 0459 04/23/21 0807 04/23/21 1153 04/23/21 1550  GLUCAP 126* 129* 135* 133* 126*  Radiological Exams on Admission: DG CHEST PORT 1 VIEW  Result Date: 04/23/2021 CLINICAL DATA:  Sudden onset shortness of breath and left chest pain when inhaling this morning. EXAM: PORTABLE CHEST 1 VIEW COMPARISON:  04/06/2021 FINDINGS: Stable normal sized heart. Right PICC tip in the inferior aspect of the superior vena cava, approximately 1.5 cm proximal to the superior cavoatrial junction. Poor inspiration with improvement. Increased left basilar airspace opacity including dense left lower lobe opacity. Interval minimal patchy opacity at the right lung base. Moderate peribronchial thickening. Mild thoracolumbar spine degenerative changes. IMPRESSION: 1. Increased airspace opacity at the left lung base including dense left lower lobe atelectasis or pneumonia. 2. Interval small amount of right basilar patchy atelectasis or pneumonia. 3. Moderate bronchitic changes. Electronically Signed   By: Beckie Salts M.D.   On: 04/23/2021 12:54     Time spent: 45 minutes  [facility] Pager [phone]  If 7PM-7AM, please contact night-coverage [email or web address] Password [facility] 04/23/2021, 6:48 PM

## 2021-04-23 NOTE — Progress Notes (Signed)
Assessment & Plan: Per Dr. Dossie Der: "[name] is a 70 year old male who underwent recurrent incisional hernia repair with removal of previous intraperitoneal mesh and rives stoppa style repair.  His posterior layer broke down where the previous mesh had been removed requiring takeback to the operating room for diagnostic laparotomy converted to exploratory laparotomy, small bowel resection with vicryl mesh reconstruction of the posterior layer and phasix retromuscular mesh placement on 04/04/2021. He recovered from this in the hospital, with a prolonged postoperative ileus which was not unexpected due to the large interparietal hernia.  A CT guided drain was placed on 04/13/21, to drain a pelvic fluid collection.  He was discharged on 04/15/21.  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  On 04/16/21, he underwent coil embolization of the splenic artery.  He is now recovering in the step-down unit with improving tachycardia and stabilizing hemoglobin."   CLD, TPN IR plans to keep drain for now - blood may eventually drain to pelvis HGB 7.1 this AM, stable Leukocytosis improving - 16K this AM  Active BS this AM, passing flatus  FEN: Sips of CLD, TPN ID: None VTE: SCDs, lovenox Foley: None Dispo: Continued care in stepdown  Patient wanted to discuss repeat CT scan.  At this time, no indication for additional imaging.  Overall, slow improvement.  Will continue to monitor closely.         Brian Level, MD       [facility]       Office: [phone]   Chief Complaint: Ventral hernia repair, splenic embolization  Subjective: Patient in bed, getting up to chair.  Complains of "band like" tightness across mid abdomen.  Passing flatus.  Objective: Vital signs in last 24 hours: Temp:  [97.8 F (36.6 C)-99.4 F (37.4 C)] 98 F (36.7 C) (07/16 0453) Pulse Rate:  [95-110] 97 (07/16  0453) Resp:  [16-22] 16 (07/16 0453) BP: (122-166)/(70-85) 122/81 (07/16 0453) SpO2:  [93 %-99 %] 93 % (07/16 0453) Last BM Date: 04/15/21  Intake/Output from previous day: 07/15 0701 - 07/16 0700 In: 2369.8 [P.O.:350; I.V.:2019.8] Out: 1150 [Urine:1150] Intake/Output this shift: No intake/output data recorded.  Physical Exam: HEENT - sclerae clear, mucous membranes moist Neck - soft Abdomen - protuberant, mildly firm, active BS present; wound in midline dry and intact Ext - no edema, non-tender Neuro - alert & oriented, no focal deficits  Lab Results:  Recent Labs    04/21/21 0233 04/23/21 0500  WBC 18.0* 16.2*  HGB 7.1* 7.1*  HCT 23.0* 23.5*  PLT 464* 472*   BMET Recent Labs    04/22/21 0405 04/23/21 0500  NA 135 134*  K 4.1 4.2  CL 99 101  CO2 27 27  GLUCOSE 125* 127*  BUN 10 12  CREATININE 0.46* 0.57*  CALCIUM 8.6* 8.2*   PT/INR No results for input(s): LABPROT, INR in the last 72 hours. Comprehensive Metabolic Panel:    Component Value Date/Time   NA 134 (L) 04/23/2021 0500   NA 135 04/22/2021 0405   K 4.2 04/23/2021 0500   K 4.1 04/22/2021 0405   CL 101 04/23/2021 0500   CL 99 04/22/2021 0405   CO2 27 04/23/2021 0500   CO2 27 04/22/2021 0405   BUN 12 04/23/2021 0500   BUN 10 04/22/2021 0405   CREATININE 0.57 (L) 04/23/2021 0500   CREATININE 0.46 (L) 04/22/2021 0405   CREATININE  0.98 04/17/2017 1049   GLUCOSE 127 (H) 04/23/2021 0500   GLUCOSE 125 (H) 04/22/2021 0405   CALCIUM 8.2 (L) 04/23/2021 0500   CALCIUM 8.6 (L) 04/22/2021 0405   AST 28 04/23/2021 0500   AST 17 04/22/2021 0405   ALT 19 04/23/2021 0500   ALT 12 04/22/2021 0405   ALKPHOS 82 04/23/2021 0500   ALKPHOS 67 04/22/2021 0405   BILITOT 0.6 04/23/2021 0500   BILITOT 0.6 04/22/2021 0405   PROT 6.5 04/23/2021 0500   PROT 6.7 04/22/2021 0405   ALBUMIN 2.4 (L) 04/23/2021 0500   ALBUMIN 2.4 (L) 04/22/2021 0405    Studies/Results: No results found.    Brian Atkinson 04/23/2021   Patient ID: Brian Atkinson, male   DOB: 09/22/1951, 70 y.o.   MRN: [id number]

## 2021-04-23 NOTE — Progress Notes (Signed)
Wife requested hard copies of labs, vital signs, and imaging reports to be printed off and given to her; stated a CT report had been printed off for her earlier in the admission. This RN informed patient bedside staff was not able to print from the EMR and give the information to patients or their family members. Wife and patient were advised to contact medical records to pursue this request. This was reiterated by charge RN and [facility]. Wife also expressed concerns regarding patient's pain (a 3/10 at this time) and abdominal distention; they have been requesting a CT scan for the past three days. Surgery has reiterated over the past few days his symptoms are to be expected and a CT scan was not indicated. They expressed a desire for a second opinion from a medical doctor. [facility] was paged, spoke with Dr. Luisa Hart. He stated he would speak with [facility] about consulting on this patient. He also ordered a CT scan with contrast. The patient and family have been informed of this and seem agreeable.

## 2021-04-23 NOTE — Progress Notes (Signed)
Patient began c/o new sharp L side chest pain while inhaling, reports 9/10 pain. Began while on toilet. EKG obtained, showed sinus tachycardia at 104 bpm. Paged  Dr. Luisa Hart at 0900, have yet to hear back. 1mg  dilaudid given. Paged general surgery PA in amion, CXR ordered. Patient now asleep and appears comfortable

## 2021-04-23 NOTE — Progress Notes (Signed)
PHARMACY - TOTAL PARENTERAL NUTRITION CONSULT NOTE   Indication: Prolonged ileus  Patient Measurements: Height: 5' 11.5" (181.6 cm) Weight: 103.9 kg (229 lb 0.9 oz) IBW/kg (Calculated) : 76.45   Body mass index is 31.5 kg/m.  Recent Labs    04/22/21 0405 04/23/21 0500  NA 135 134*  K 4.1 4.2  CL 99 101  CO2 27 27  GLUCOSE 125* 127*  BUN 10 12  CREATININE 0.46* 0.57*  CALCIUM 8.6* 8.2*  PHOS 3.4 3.8  MG 2.0 1.9  ALBUMIN 2.4* 2.4*  ALKPHOS 67 82  AST 17 28  ALT 12 19  BILITOT 0.6 0.6   7/15 CorrCa 9.9  7/16 CorrCa 9.48    Assessment:  70 yo male s/p ventral hernia repair with mesh on 6/20. 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 He had a postoperative ileus for which he was on TPN. He also developed a pelvic abscess which was drained by IR on 7/6. His surgical retrorectus drain was removed on 7/7. Prior to discharge he was tolerating a diet and TPN was stopped. He was discharged home 7/8.  He was readmitted 7/9 with hypotension, AKI and leukocytosis.  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  On 04/16/21, he underwent coil embolization of the splenic artery.  He has not been tolerating diet, so Pharmacy has been consulted to start TPN.  Glucose / Insulin: No hx DM - CBGs 119-144 on SSI q4h  - 6 units required in past 24h Electrolytes: WNL (magnesium 1.9)  Renal: BUN, SCr stable; UOP recorded as 1150 Hepatic: WNL Prealb: 10.5 TG: 85 Intake / Output: net +1219.8 MIVF: none GI Imaging: GI Surgeries / Procedures: - 6/20: repair of recurrent ventral hernia with mesh - 6/27: retromuscular recurrent incisional hernia repair with vicral mesh, SB resection - 7/6: CT guided pelvic fluid collection drain placement - 7/9:  coil embolization of splenic artery -7/14: pelvic drain removed at bedside  Central access: PICC 7/13 TPN start date: 7/13  Nutritional Goals (updated RD recommendation on 7/13): kCal: 2250-2350, Protein: 115-130, Fluid: 2.4L/day Goal TPN rate is 100 mL/hr (provides 120 g of protein and 2347 kcals per day)  Current Nutrition:  Clear liquids Boost / Resource Breeze q24h ProSource 30 mL daily  Plan:  Magnesium 1 g IV x 1 Increase TPN to goal rate of 100 mL/hr at 1800 - this will provide 120 g protein, 2347 kcal Electrolytes in TPN:  Na 64mEq/L  K 87mEq/L Ca 42mEq/L  Mg 6mEq/L  Phos 41mmol/L  Cl:Ac 1:1 Add standard MVI and trace elements to TPN Continue Sensitive q4h SSI and adjust as needed  IVF per MD - none currently Monitor TPN labs on Mon/Thurs,  CMET/Mg/Phos in AM  Emmogene Simson P. Casimiro Needle, PharmD, BCPS Clinical Pharmacist [place] Please utilize Amion for appropriate phone number to reach the unit pharmacist [facility]) 04/23/2021 7:43 AM

## 2021-04-23 NOTE — Progress Notes (Addendum)
Patient ID: Brian Atkinson, male   DOB: Apr 24, 1951, 70 y.o.   MRN: [id number]  General Surgery [facility]  Called to see patient for onset left sided chest pain.  CXR just done appears to have left sided effusion - await radiology interpretation.  Patient appears comfortable in bed now.  Auscultation of chest with good breath sounds bilaterally, no rales, no wheeze.  On Lovenox, not tachycardic, oxygen saturation good - doubt PE.  Effusion may be due to recent splenic embolization.  Will follow.  [name], MD [facility] Office: [phone]

## 2021-04-23 NOTE — Plan of Care (Signed)
Discussed with patient plan of care for the evening, pain management and CT scan with contrast needed with some teach back displayed.  CT called since patient has finished drink contrast and will call transport to take him down,  Problem: Education: Goal: Knowledge of General Education information will improve Description: Including pain rating scale, medication(s)/side effects and non-pharmacologic comfort measures Outcome: Progressing   Problem: Health Behavior/Discharge Planning: Goal: Ability to manage health-related needs will improve Outcome: Progressing

## 2021-04-24 ENCOUNTER — Inpatient Hospital Stay (HOSPITAL_COMMUNITY): Payer: Medicare Other

## 2021-04-24 DIAGNOSIS — J189 Pneumonia, unspecified organism: Secondary | ICD-10-CM

## 2021-04-24 DIAGNOSIS — D72829 Elevated white blood cell count, unspecified: Secondary | ICD-10-CM

## 2021-04-24 DIAGNOSIS — J90 Pleural effusion, not elsewhere classified: Secondary | ICD-10-CM

## 2021-04-24 LAB — BODY FLUID CELL COUNT WITH DIFFERENTIAL
Eos, Fluid: 0 %
Lymphs, Fluid: 5 %
Monocyte-Macrophage-Serous Fluid: 27 % — ABNORMAL LOW (ref 50–90)
Neutrophil Count, Fluid: 68 % — ABNORMAL HIGH (ref 0–25)
Total Nucleated Cell Count, Fluid: 3862 cu mm — ABNORMAL HIGH (ref 0–1000)

## 2021-04-24 LAB — GLUCOSE, CAPILLARY
Glucose-Capillary: 113 mg/dL — ABNORMAL HIGH (ref 70–99)
Glucose-Capillary: 129 mg/dL — ABNORMAL HIGH (ref 70–99)
Glucose-Capillary: 145 mg/dL — ABNORMAL HIGH (ref 70–99)
Glucose-Capillary: 118 mg/dL — ABNORMAL HIGH (ref 70–99)
Glucose-Capillary: 142 mg/dL — ABNORMAL HIGH (ref 70–99)
Glucose-Capillary: 113 mg/dL — ABNORMAL HIGH (ref 70–99)

## 2021-04-24 LAB — COMPREHENSIVE METABOLIC PANEL
ALT: 29 U/L (ref 0–44)
AST: 39 U/L (ref 15–41)
Albumin: 2.4 g/dL — ABNORMAL LOW (ref 3.5–5.0)
Alkaline Phosphatase: 98 U/L (ref 38–126)
Anion gap: 7 (ref 5–15)
BUN: 12 mg/dL (ref 8–23)
CO2: 26 mmol/L (ref 22–32)
Calcium: 8.2 mg/dL — ABNORMAL LOW (ref 8.9–10.3)
Chloride: 99 mmol/L (ref 98–111)
Creatinine, Ser: 0.66 mg/dL (ref 0.61–1.24)
GFR, Estimated: >60 mL/min (ref >60)
Glucose, Bld: 112 mg/dL — ABNORMAL HIGH (ref 70–99)
Potassium: 4.3 mmol/L (ref 3.5–5.1)
Sodium: 132 mmol/L — ABNORMAL LOW (ref 135–145)
Total Bilirubin: 0.6 mg/dL (ref 0.3–1.2)
Total Protein: 6.7 g/dL (ref 6.5–8.1)

## 2021-04-24 LAB — CBC
HCT: 23.8 % — ABNORMAL LOW (ref 39.0–52.0)
Hemoglobin: 7.3 g/dL — ABNORMAL LOW (ref 13.0–17.0)
MCH: 27.2 pg (ref 26.0–34.0)
MCHC: 30.7 g/dL (ref 30.0–36.0)
MCV: 88.8 fL (ref 80.0–100.0)
Platelets: 449 10*3/uL — ABNORMAL HIGH (ref 150–400)
RBC: 2.68 MIL/uL — ABNORMAL LOW (ref 4.22–5.81)
RDW: 15.6 % — ABNORMAL HIGH (ref 11.5–15.5)
WBC: 18.3 10*3/uL — ABNORMAL HIGH (ref 4.0–10.5)
nRBC: 0.1 % (ref 0.0–0.2)

## 2021-04-24 LAB — LACTATE DEHYDROGENASE, PLEURAL OR PERITONEAL FLUID: LD, Fluid: 725 U/L — ABNORMAL HIGH (ref 3–23)

## 2021-04-24 LAB — GLUCOSE, PLEURAL OR PERITONEAL FLUID: Glucose, Fluid: 128 mg/dL

## 2021-04-24 LAB — MAGNESIUM: Magnesium: 2 mg/dL (ref 1.7–2.4)

## 2021-04-24 LAB — ALBUMIN, PLEURAL OR PERITONEAL FLUID: Albumin, Fluid: 1.8 g/dL

## 2021-04-24 LAB — PROTEIN, PLEURAL OR PERITONEAL FLUID: Total protein, fluid: 3.8 g/dL

## 2021-04-24 LAB — PHOSPHORUS: Phosphorus: 3.5 mg/dL (ref 2.5–4.6)

## 2021-04-24 MED ORDER — LIDOCAINE HCL 1 % IJ SOLN
INTRAMUSCULAR | Status: AC
  Filled 2021-04-24: qty 20

## 2021-04-24 MED ORDER — CHLORHEXIDINE GLUCONATE CLOTH 2 % EX PADS
6.0000 | MEDICATED_PAD | Freq: Every day | CUTANEOUS | Status: DC
  Administered 2021-04-24 – 2021-05-01 (×6): 6 via TOPICAL

## 2021-04-24 MED ORDER — VANCOMYCIN HCL 1000 MG/200ML IV SOLN: 1000.0000 mg | Freq: Two times a day (BID) | INTRAVENOUS | Status: DC

## 2021-04-24 MED ORDER — VANCOMYCIN HCL 2000 MG/400ML IV SOLN
2000.0000 mg | Freq: Once | INTRAVENOUS | Status: AC
  Administered 2021-04-24: 2000 mg via INTRAVENOUS
  Filled 2021-04-24: qty 400

## 2021-04-24 MED ORDER — TRACE MINERALS CU-MN-SE-ZN 300-55-60-3000 MCG/ML IV SOLN
INTRAVENOUS | Status: AC
  Filled 2021-04-24: qty 1200

## 2021-04-24 MED ORDER — SODIUM CHLORIDE 0.9 % IV SOLN
2.0000 g | Freq: Three times a day (TID) | INTRAVENOUS | Status: DC
  Administered 2021-04-24 – 2021-04-26 (×8): 2 g via INTRAVENOUS
  Filled 2021-04-24 (×8): qty 2

## 2021-04-24 NOTE — Progress Notes (Signed)
Pt sent to IR

## 2021-04-24 NOTE — Progress Notes (Signed)
PHARMACY - TOTAL PARENTERAL NUTRITION CONSULT NOTE   Indication: Prolonged ileus  Patient Measurements: Height: 5' 11.5" (181.6 cm) Weight: 109.4 kg (241 lb 2.9 oz) IBW/kg (Calculated) : 76.45   Body mass index is 33.17 kg/m.  Recent Labs    04/23/21 0500 04/24/21 0434  NA 134* 132*  K 4.2 4.3  CL 101 99  CO2 27 26  GLUCOSE 127* 112*  BUN 12 12  CREATININE 0.57* 0.66  CALCIUM 8.2* 8.2*  PHOS 3.8 3.5  MG 1.9 2.0  ALBUMIN 2.4* 2.4*  ALKPHOS 82 98  AST 28 39  ALT 19 29  BILITOT 0.6 0.6   7/15 CorrCa 9.9  7/16 CorrCa 9.48 7/17 CorrCa 9.48   Assessment:  70 yo male s/p ventral hernia repair with mesh on 6/20. 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 He had a postoperative ileus for which he was on TPN. He also developed a pelvic abscess which was drained by IR on 7/6. His surgical retrorectus drain was removed on 7/7. Prior to discharge he was tolerating a diet and TPN was stopped. He was discharged home 7/8.  He was readmitted 7/9 with hypotension, AKI and leukocytosis.  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  On 04/16/21, he underwent coil embolization of the splenic artery.  He has not been tolerating diet, so Pharmacy has been consulted to start TPN.  Glucose / Insulin: No hx DM - CBGs 112-138 on SSI q4h  - 4 units required in past 24h Electrolytes: WNL except Na down slightly at 132 Renal: BUN, SCr stable; UOP recorded as 1775 Hepatic: WNL Prealb: 10.5 TG: 85 Intake / Output: net +1219.8 MIVF: none GI Imaging: GI Surgeries / Procedures: - 6/20: repair of recurrent ventral hernia with mesh - 6/27: retromuscular recurrent incisional hernia repair with vicral mesh, SB resection - 7/6: CT guided pelvic fluid  collection drain placement - 7/9: coil embolization of splenic artery -7/14: pelvic drain removed at bedside  Central access: PICC 7/13 TPN start date: 7/13  Nutritional Goals (updated RD recommendation on 7/13): kCal: 2250-2350, Protein: 115-130, Fluid: 2.4L/day Goal TPN rate is 100 mL/hr (provides 120 g of protein and 2347 kcals per day)  Current Nutrition:  Clear liquids Boost / Resource Breeze q24h ProSource 30 mL daily  Plan:  Continue TPN at goal rate of 100 mL/hr at 1800 - provides 120 g protein, 2347 kcal Electrolytes in TPN:  Increase Na to 16mEq/L  K 26mEq/L Ca 46mEq/L  Mg 88mEq/L  Phos 34mmol/L  Cl:Ac 1:1 Add standard MVI and trace elements to TPN Continue Sensitive q4h SSI and adjust as needed  IVF per MD - none currently Monitor TPN labs on Mon/Thurs   Taquilla Downum P. Casimiro Needle, PharmD, BCPS Clinical Pharmacist [place] Please utilize Amion for appropriate phone number to reach the unit pharmacist [facility]) 04/24/2021 8:13 AM

## 2021-04-24 NOTE — Progress Notes (Signed)
Assessment & Plan: Per Dr. Dossie Der: "Brian Atkinson is a 70 year old male who underwent recurrent incisional hernia repair with removal of previous intraperitoneal mesh and rives stoppa style repair.  His posterior layer broke down where the previous mesh had been removed requiring takeback to the operating room for diagnostic laparotomy converted to exploratory laparotomy, small bowel resection with vicryl mesh reconstruction of the posterior layer and phasix retromuscular mesh placement on 04/04/2021. He recovered from this in the hospital, with a prolonged postoperative ileus which was not unexpected due to the large interparietal hernia.  A CT guided drain was placed on 04/13/21, to drain a pelvic fluid collection.  He was discharged on 04/15/21.  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  On 04/16/21, he underwent coil embolization of the splenic artery.  He is now recovering in the step-down unit with improving tachycardia and stabilizing hemoglobin."   CLD, TPN IR plans to keep drain for now - blood may eventually drain to pelvis HGB 7.3 this AM, stable Leukocytosis stable - 18K this AM             Active BS this AM, passing flatus, BM yesterday   CT scan last PM reviewed and discussed with patient   Left effusion with LLL collapse   CCM consulted - Dr. Felipa Eth to see this morning   Appreciate TRH - Dr. Mikeal Hawthorne - evaluating patient and assisting with care   FEN: Sips of CLD, TPN ID: None VTE: SCDs, lovenox Foley: None Dispo: Continued care in stepdown   Patient wanted to discuss repeat CT scan.  At this time, no indication for additional imaging.  Overall, slow improvement.  Will continue to monitor closely.        [name], MD       [facility]       Office: [phone]   Chief Complaint: Splenic rupture, recurrent ventral incisional hernia  Subjective: Patient in bed, concerned  about abdominal distension.  Passing flatus.  Objective: Vital signs in last 24 hours: Temp:  [97.9 F (36.6 C)-98.8 F (37.1 C)] 98.6 F (37 C) (07/17 0341) Pulse Rate:  [93-117] 98 (07/17 0749) Resp:  [13-22] 14 (07/17 0749) BP: (130-149)/(57-80) 132/80 (07/17 0331) SpO2:  [93 %-100 %] 94 % (07/17 0749) Weight:  [109.4 kg] 109.4 kg (07/17 0427) Last BM Date: 04/23/21  Intake/Output from previous day: 07/16 0701 - 07/17 0700 In: 3583.4 [P.O.:690; I.V.:2397.8; IV Piggyback:495.6] Out: 1775 [Urine:1775] Intake/Output this shift: No intake/output data recorded.  Physical Exam: HEENT - sclerae clear, mucous membranes moist Neck - soft Chest - clear bilaterally Cor - RRR Abdomen - moderate distension; active BS present Ext - mild LE edema, non-tender Neuro - alert & oriented, no focal deficits  Lab Results:  Recent Labs    04/23/21 0500 04/24/21 0434  WBC 16.2* 18.3*  HGB 7.1* 7.3*  HCT 23.5* 23.8*  PLT 472* 449*   BMET Recent Labs    04/23/21 0500 04/24/21 0434  NA 134* 132*  K 4.2 4.3  CL 101 99  CO2 27 26  GLUCOSE 127* 112*  BUN 12 12  CREATININE 0.57* 0.66  CALCIUM 8.2* 8.2*   PT/INR No results for input(s): LABPROT, INR in the last 72 hours. Comprehensive Metabolic Panel:    Component Value Date/Time   NA 132 (L) 04/24/2021 0434   NA 134 (L) 04/23/2021 0500   K 4.3  04/24/2021 0434   K 4.2 04/23/2021 0500   CL 99 04/24/2021 0434   CL 101 04/23/2021 0500   CO2 26 04/24/2021 0434   CO2 27 04/23/2021 0500   BUN 12 04/24/2021 0434   BUN 12 04/23/2021 0500   CREATININE 0.66 04/24/2021 0434   CREATININE 0.57 (L) 04/23/2021 0500   CREATININE 0.98 04/17/2017 1049   GLUCOSE 112 (H) 04/24/2021 0434   GLUCOSE 127 (H) 04/23/2021 0500   CALCIUM 8.2 (L) 04/24/2021 0434   CALCIUM 8.2 (L) 04/23/2021 0500   AST 39 04/24/2021 0434   AST 28 04/23/2021 0500   ALT 29 04/24/2021 0434   ALT 19 04/23/2021 0500   ALKPHOS 98 04/24/2021 0434   ALKPHOS 82  04/23/2021 0500   BILITOT 0.6 04/24/2021 0434   BILITOT 0.6 04/23/2021 0500   PROT 6.7 04/24/2021 0434   PROT 6.5 04/23/2021 0500   ALBUMIN 2.4 (L) 04/24/2021 0434   ALBUMIN 2.4 (L) 04/23/2021 0500    Studies/Results: CT CHEST ABDOMEN PELVIS W CONTRAST  Result Date: 04/23/2021 CLINICAL DATA:  Chest wall pain, acute nonlocalized abdominal pain EXAM: CT CHEST, ABDOMEN, AND PELVIS WITH CONTRAST TECHNIQUE: Multidetector CT imaging of the chest, abdomen and pelvis was performed following the standard protocol during bolus administration of intravenous contrast. CONTRAST:  28mL OMNIPAQUE IOHEXOL 350 MG/ML SOLN COMPARISON:  CT abdomen pelvis 04/16/2021, CT chest 07/23/2020 FINDINGS: CT CHEST FINDINGS Cardiovascular: No significant coronary artery calcification. Global cardiac size within normal limits. No pericardial effusion. Central pulmonary arteries are of normal caliber. Mild atherosclerotic calcifications seen within the thoracic aorta. No aortic aneurysm. Right upper extremity PICC line tip is seen at the superior cavoatrial junction. Mediastinum/Nodes: Esophagus unremarkable. Thyroid unremarkable. No pathologic thoracic adenopathy. Lungs/Pleura: Interval development of a moderate left pleural effusion with complete collapse of the left lower lobe. Minimal ground-glass infiltrate within the right upper lobe at the apex is nonspecific, but may be infectious or inflammatory in nature. Mild right basilar atelectasis. The lungs are otherwise clear. No pneumothorax. No central obstructing lesion. Musculoskeletal: No acute bone abnormality. No lytic or blastic bone lesion. CT ABDOMEN PELVIS FINDINGS Hepatobiliary: Scattered cysts and again identified within the liver. No enhancing intrahepatic mass identified. No intra or extrahepatic biliary ductal dilation. Layering high attenuation material is again seen within the gallbladder lumen possibly representing gallbladder sludge or vicarious excretion of  contrast. Pancreas: Unremarkable Spleen: A a wedge like perfusion defect has developed within the mid body of the spleen likely representing a a focal infarct related to recent splenic artery coil embolization. Multiple embolization coils are seen adjacent to the distal tail of the pancreas within the mid to distal splenic artery. Large mixed attenuation subcapsular splenic hematoma is again identified and is stable in size since prior examination measuring roughly 15.7 cm in greatest dimension. Small perisplenic hemorrhage is again identified and appears stable. Hemoperitoneum has decreased in the interval since prior examination. Adrenals/Urinary Tract: The adrenal glands are unremarkable. The kidneys are normal in size and position. Tiny probable cortical cyst within the interpolar region of the left kidney. The kidneys are otherwise unremarkable. Bladder unremarkable. Stomach/Bowel: The stomach is mildly displaced from the a left upper quadrant by the large splenic hematoma, similar to that noted on prior examination a small amount of hemorrhage seen along the greater curvature of the stomach, unchanged. The stomach, small bowel, and large bowel are otherwise unremarkable. Appendectomy has been performed. No free intraperitoneal gas. Vascular/Lymphatic: Mild aortoiliac atherosclerotic calcification. No aortic aneurysm. Interval coil embolization  of the a mid to distal splenic artery. Reproductive: Prostate is unremarkable. Other: Small fluid within the deep abdominal fascia of the anterior abdominal wall subjacent to the umbilicus in keeping with recent ventral hernia repair. No recurrent abdominal wall hernia. Small right inguinal fat containing hernia noted. Mild subcutaneous edema has developed within the flanks bilaterally in keeping with mild anasarca. Musculoskeletal: No acute bone abnormality within the abdomen and pelvis. Osseous structures are age-appropriate. IMPRESSION: Interval coil embolization of  the mid to distal splenic artery with development of a probable infarct within the mid body of the spleen. Stable large subcapsular splenic hematoma and moderate perisplenic hemorrhage. Decreasing hemoperitoneum. Interval development of a moderate left pleural effusion with complete collapse of the left lower lobe. Minimal ground-glass infiltrate within the right apex possibly infectious or inflammatory in nature. Surgical changes in keeping with ventral hernia repair, unchanged. Aortic Atherosclerosis (ICD10-I70.0). Electronically Signed   By: Helyn Numbers MD   On: 04/23/2021 23:18   DG CHEST PORT 1 VIEW  Result Date: 04/23/2021 CLINICAL DATA:  Sudden onset shortness of breath and left chest pain when inhaling this morning. EXAM: PORTABLE CHEST 1 VIEW COMPARISON:  04/06/2021 FINDINGS: Stable normal sized heart. Right PICC tip in the inferior aspect of the superior vena cava, approximately 1.5 cm proximal to the superior cavoatrial junction. Poor inspiration with improvement. Increased left basilar airspace opacity including dense left lower lobe opacity. Interval minimal patchy opacity at the right lung base. Moderate peribronchial thickening. Mild thoracolumbar spine degenerative changes. IMPRESSION: 1. Increased airspace opacity at the left lung base including dense left lower lobe atelectasis or pneumonia. 2. Interval small amount of right basilar patchy atelectasis or pneumonia. 3. Moderate bronchitic changes. Electronically Signed   By: Beckie Salts M.D.   On: 04/23/2021 12:54      [name] 04/24/2021   Patient ID: Brian Atkinson, male   DOB: 02-10-1951, 70 y.o.   MRN: [id number]

## 2021-04-24 NOTE — Consult Note (Signed)
Name: Brian Atkinson MRN: [id number] DOB: 05-23-1951    ADMISSION DATE:  04/15/2021 CONSULTATION DATE:  04/24/2021   REFERRING MD :  [name] CCS  CHIEF COMPLAINT: Left pleural effusion  BRIEF PATIENT DESCRIPTION: 70 year old never smoker with a complicated course post incisional hernia repair, requiring laparotomy and small bowel resection.  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  PCCM consulted for left lower lobe atelectasis and pleural effusion noted on CT abdomen 7/16  SIGNIFICANT EVENTS  6/27 laparotomy, small bowel resection, mesh reconstruction 7/9: Embolization of splenic artery  STUDIES:  CT chest/abdomen/pelvis 7/16 moderate left effusion, left lower lobe atelectasis, splenic infarct/hematoma with perisplenic hemorrhage Decreased pneumoperitoneum  CTA abdomen/pelvis 7/9 moderate hemoperitoneum, trace left effusion   HISTORY OF PRESENT ILLNESS: 70 year old who unfortunately has had a complicated course post incisional hernia repair.  He initially underwent surgery with removal of previous intraperitoneal mesh and repair.  He required another surgery in 6/27 for laparotomy with small bowel resection and mesh reconstruction.  He had a prolonged postoperative ileus and developed a pelvic fluid collection which required CT-guided drainage.  He was discharged on 7/8 but returned and was found to have a ruptured spleen and required coil obliteration of the splenic artery He complains of left-sided chest pain and left upper quadrant pain which was persistent, medical consultation was obtained.  Chest x-ray showed left lower lobe airspace disease and effusion.  CT chest/abdomen/pelvis showed left lower lobe atelectasis and left effusion.  Hence PCCM consulted  PAST MEDICAL HISTORY :   has a past medical history of Acute osteomyelitis of patella (HCC), Complication of anesthesia, History of MRSA infection, Medical history  non-contributory, and Patella fracture.  has a past surgical history that includes Tonsillectomy; ORIF patella (Left, 06/12/2014); Irrigation and debridement knee (Left, 06/17/2014); Debridement and closure wound (Left, 06/19/2014); Knee bursectomy (Left, 05/27/2015); Refractive surgery (Bilateral); Knee bursectomy (Left, 05/27/2015); Cataract extraction w/ intraocular lens  implant, bilateral; Colonoscopy; Bone excision (Left, 09/25/2017); External fixation leg (Left, 07/23/2020); External fixation leg (Left, 07/27/2020); ORIF tibia plateau (Left, 08/12/2020); ORIF tibia plateau (Left, 08/12/2020); External fixation removal (Left, 08/12/2020); ORIF tibia plateau (Left, 12/14/2020); Ventral hernia repair (N/A, 03/28/2021); Foreign Body Removal (N/A, 03/28/2021); laparoscopy (N/A, 04/04/2021); IR US Guide Vasc Access Right (04/16/2021); IR Angiogram Visceral Selective (04/16/2021); IR Angiogram Selective Each Additional Vessel (04/16/2021); and IR EMBO ART  VEN HEMORR LYMPH EXTRAV  INC GUIDE ROADMAPPING (04/16/2021). Prior to Admission medications   Medication Sig Start Date End Date Taking? Authorizing Provider  acetaminophen (TYLENOL) 500 MG tablet Take 2 tablets (1,000 mg total) by mouth every 6 (six) hours as needed for mild pain or fever. 04/15/21   Juliet Rude, PA-C  cetirizine (ZYRTEC) 10 MG tablet Take 10 mg by mouth daily as needed for allergies.    [provider]  docusate sodium (COLACE) 100 MG capsule Take 1 capsule (100 mg total) by mouth 2 (two) times daily. 04/15/21   Juliet Rude, PA-C  gabapentin (NEURONTIN) 300 MG capsule Take 1 capsule (300 mg total) by mouth 3 (three) times daily. 04/15/21   Juliet Rude, PA-C  Multiple Vitamins-Minerals (ONE-A-DAY MENS 50+) TABS Take 1 tablet by mouth daily.    [provider]  oxyCODONE (OXY IR/ROXICODONE) 5 MG immediate release tablet Take 1 tablet (5 mg total) by mouth every 4 (four) hours as needed for moderate pain. 04/15/21   Juliet Rude,  PA-C  pantoprazole (PROTONIX) 40 MG tablet Take 1  tablet (40 mg total) by mouth 2 (two) times daily. 04/15/21   Juliet Rude, PA-C  polyethylene glycol (MIRALAX / GLYCOLAX) 17 g packet Take 17 g by mouth daily. 04/15/21   Juliet Rude, PA-C  sodium chloride flush (NS) 0.9 % SOLN 5 mLs by Intracatheter route daily. 04/15/21   Juliet Rude, PA-C  enoxaparin (LOVENOX) 40 MG/0.4ML injection Inject 0.4 mLs (40 mg total) into the skin daily. 07/28/20 08/16/20  Montez Morita, PA-C  ferrous sulfate 325 (65 FE) MG tablet Take 1 tablet (325 mg total) by mouth 3 (three) times daily with meals. Patient not taking: Reported on 12/09/2020 07/28/20 12/17/20  Montez Morita, PA-C  rivaroxaban (XARELTO) 10 MG TABS tablet TAKE 1 TABLET (10 MG TOTAL) BY MOUTH DAILY. Patient not taking: No sig reported 12/17/20 04/15/21  Montez Morita, PA-C   Allergies  Allergen Reactions   Versed [Midazolam] Other (See Comments)    Memory problems    FAMILY HISTORY:  family history is not on file. SOCIAL HISTORY:  reports that he has never smoked. He has never used smokeless tobacco. He reports previous alcohol use. He reports that he does not use drugs.  REVIEW OF SYSTEMS:   Complains of abdominal pain   Constitutional: Negative for fever, chills, weight loss, malaise/fatigue and diaphoresis.  HENT: Negative for hearing loss, ear pain, nosebleeds, congestion, sore throat, neck pain, tinnitus and ear discharge.   Eyes: Negative for blurred vision, double vision, photophobia, pain, discharge and redness.  Respiratory: Negative for cough, hemoptysis, sputum production, shortness of breath, wheezing and stridor.   Cardiovascular: Negative for chest pain, palpitations, orthopnea, claudication, leg swelling and PND.  Gastrointestinal: Negative for heartburn, nausea, vomiting,  diarrhea, constipation, blood in stool and melena.  Genitourinary: Negative for dysuria, urgency, frequency, hematuria and flank pain.  Musculoskeletal:  Negative for myalgias, back pain, joint pain and falls.  Skin: Negative for itching and rash.  Neurological: Negative for dizziness, tingling, tremors, sensory change, speech change, focal weakness, seizures, loss of consciousness, weakness and headaches.  Endo/Heme/Allergies: Negative for environmental allergies and polydipsia. Does not bruise/bleed easily.  SUBJECTIVE:   VITAL SIGNS: Temp:  [98.1 F (36.7 C)-98.8 F (37.1 C)] 98.6 F (37 C) (07/17 0341) Pulse Rate:  [93-117] 98 (07/17 0749) Resp:  [13-22] 14 (07/17 0749) BP: (130-149)/(57-80) 132/80 (07/17 0331) SpO2:  [93 %-100 %] 94 % (07/17 0749) Weight:  [109.4 kg] 109.4 kg (07/17 0427)  PHYSICAL EXAMINATION: General: Well-built man, sitting up in bed, no distress Neuro: Alert, interactive, no focal deficits HEENT: Mild pallor, no icterus, no JVD, no lymphadenopathy Cardiovascular: S1-S2 regular Lungs: Decreased breath sounds on left, no rhonchi, no accessory muscle use Abdomen: Soft, mild tenderness left upper quadrant, no guarding Musculoskeletal: No deformity Skin: No rash  Recent Labs  Lab 04/22/21 0405 04/23/21 0500 04/24/21 0434  NA 135 134* 132*  K 4.1 4.2 4.3  CL 99 101 99  CO2 27 27 26   BUN 10 12 12   CREATININE 0.46* 0.57* 0.66  GLUCOSE 125* 127* 112*   Recent Labs  Lab 04/21/21 0233 04/23/21 0500 04/24/21 0434  HGB 7.1* 7.1* 7.3*  HCT 23.0* 23.5* 23.8*  WBC 18.0* 16.2* 18.3*  PLT 464* 472* 449*   CT CHEST ABDOMEN PELVIS W CONTRAST  Result Date: 04/23/2021 CLINICAL DATA:  Chest wall pain, acute nonlocalized abdominal pain EXAM: CT CHEST, ABDOMEN, AND PELVIS WITH CONTRAST TECHNIQUE: Multidetector CT imaging of the chest, abdomen and pelvis was performed following the standard protocol during bolus  administration of intravenous contrast. CONTRAST:  28mL OMNIPAQUE IOHEXOL 350 MG/ML SOLN COMPARISON:  CT abdomen pelvis 04/16/2021, CT chest 07/23/2020 FINDINGS: CT CHEST FINDINGS Cardiovascular: No  significant coronary artery calcification. Global cardiac size within normal limits. No pericardial effusion. Central pulmonary arteries are of normal caliber. Mild atherosclerotic calcifications seen within the thoracic aorta. No aortic aneurysm. Right upper extremity PICC line tip is seen at the superior cavoatrial junction. Mediastinum/Nodes: Esophagus unremarkable. Thyroid unremarkable. No pathologic thoracic adenopathy. Lungs/Pleura: Interval development of a moderate left pleural effusion with complete collapse of the left lower lobe. Minimal ground-glass infiltrate within the right upper lobe at the apex is nonspecific, but may be infectious or inflammatory in nature. Mild right basilar atelectasis. The lungs are otherwise clear. No pneumothorax. No central obstructing lesion. Musculoskeletal: No acute bone abnormality. No lytic or blastic bone lesion. CT ABDOMEN PELVIS FINDINGS Hepatobiliary: Scattered cysts and again identified within the liver. No enhancing intrahepatic mass identified. No intra or extrahepatic biliary ductal dilation. Layering high attenuation material is again seen within the gallbladder lumen possibly representing gallbladder sludge or vicarious excretion of contrast. Pancreas: Unremarkable Spleen: A a wedge like perfusion defect has developed within the mid body of the spleen likely representing a a focal infarct related to recent splenic artery coil embolization. Multiple embolization coils are seen adjacent to the distal tail of the pancreas within the mid to distal splenic artery. Large mixed attenuation subcapsular splenic hematoma is again identified and is stable in size since prior examination measuring roughly 15.7 cm in greatest dimension. Small perisplenic hemorrhage is again identified and appears stable. Hemoperitoneum has decreased in the interval since prior examination. Adrenals/Urinary Tract: The adrenal glands are unremarkable. The kidneys are normal in size and  position. Tiny probable cortical cyst within the interpolar region of the left kidney. The kidneys are otherwise unremarkable. Bladder unremarkable. Stomach/Bowel: The stomach is mildly displaced from the a left upper quadrant by the large splenic hematoma, similar to that noted on prior examination a small amount of hemorrhage seen along the greater curvature of the stomach, unchanged. The stomach, small bowel, and large bowel are otherwise unremarkable. Appendectomy has been performed. No free intraperitoneal gas. Vascular/Lymphatic: Mild aortoiliac atherosclerotic calcification. No aortic aneurysm. Interval coil embolization of the a mid to distal splenic artery. Reproductive: Prostate is unremarkable. Other: Small fluid within the deep abdominal fascia of the anterior abdominal wall subjacent to the umbilicus in keeping with recent ventral hernia repair. No recurrent abdominal wall hernia. Small right inguinal fat containing hernia noted. Mild subcutaneous edema has developed within the flanks bilaterally in keeping with mild anasarca. Musculoskeletal: No acute bone abnormality within the abdomen and pelvis. Osseous structures are age-appropriate. IMPRESSION: Interval coil embolization of the mid to distal splenic artery with development of a probable infarct within the mid body of the spleen. Stable large subcapsular splenic hematoma and moderate perisplenic hemorrhage. Decreasing hemoperitoneum. Interval development of a moderate left pleural effusion with complete collapse of the left lower lobe. Minimal ground-glass infiltrate within the right apex possibly infectious or inflammatory in nature. Surgical changes in keeping with ventral hernia repair, unchanged. Aortic Atherosclerosis (ICD10-I70.0). Electronically Signed   By: Helyn Numbers MD   On: 04/23/2021 23:18   DG CHEST PORT 1 VIEW  Result Date: 04/23/2021 CLINICAL DATA:  Sudden onset shortness of breath and left chest pain when inhaling this  morning. EXAM: PORTABLE CHEST 1 VIEW COMPARISON:  04/06/2021 FINDINGS: Stable normal sized heart. Right PICC tip in the inferior aspect of  the superior vena cava, approximately 1.5 cm proximal to the superior cavoatrial junction. Poor inspiration with improvement. Increased left basilar airspace opacity including dense left lower lobe opacity. Interval minimal patchy opacity at the right lung base. Moderate peribronchial thickening. Mild thoracolumbar spine degenerative changes. IMPRESSION: 1. Increased airspace opacity at the left lung base including dense left lower lobe atelectasis or pneumonia. 2. Interval small amount of right basilar patchy atelectasis or pneumonia. 3. Moderate bronchitic changes. Electronically Signed   By: Beckie SaltsSteven  Reid M.D.   On: 04/23/2021 12:54    ASSESSMENT / PLAN:  Left pleural effusion with left lower lobe atelectasis -this is new compared to 7/9 Large subcapsular splenic hematoma and perisplenic hemorrhage status postembolization Persistent leukocytosis  -Differential diagnosis includes reactive left effusion versus hemothorax related to perisplenic bleeding -Unlikely infection, more likely that leukocytosis is related to splenic infarction -Left lower lobe atelectasis more likely related to splinting or compressive from the effusion.  I performed bedside ultrasound but unable to visualize fluid pocket clearly for bedside thoracentesis.  Recommend Will request IR to perform -discussed risks and benefits in detail with patient, encouraged him to use incentive spirometry meantime Okay with empiric antibiotics until fluid clarified    Cyril Mourningakesh Symir Mah MD. FCCP. [facility] Pager : [phone]  If no response to pager , please call [phone] until 7 pm After 7:00 pm call Elink  [phone]     04/24/2021, 9:05 AM

## 2021-04-24 NOTE — Procedures (Signed)
Ultrasound-guided diagnostic and therapeutic left thoracentesis performed yielding 400 cc of dark reddish yellow fluid. No immediate complications. Follow-up chest x-ray pending. The fluid was sent to the lab for preordered studies. EBL < 1 cc.

## 2021-04-24 NOTE — Progress Notes (Signed)
Patient ID: Brian Atkinson, male   DOB: 1951/02/07, 70 y.o.   MRN: [id number]  PROGRESS NOTE    Brian Atkinson  [id number] DOB: 1951/02/07 DOA: 04/15/2021 PCP: Shelly Rubensteinh Park, Angela J, MD   Brief Narrative:  70 year old male with history of recurrent incisional hernia which was repaired a month ago with intraperitoneal mesh with subsequent need for laparotomy and small bowel resection and mesh reconstruction followed by postop ileus and pelvic fluid collection requiring CT-guided drainage and subsequent discharge was readmitted on 04/15/2021 under general surgery's service with ruptured spleen requiring Cholybar ligation of splenic artery on 04/16/2021.  GI service was consulted on 04/23/2021 for shortness of breath and pleuritic chest pain with chest x-ray suggestive of possible pneumonia.  He was started on IV antibiotics.  CT chest/abdomen/pelvis showed moderate left pleural effusion with collapse of left lower lobe along with groundglass infiltrate within the right apex.  Assessment & Plan:   Moderate left pleural effusion with left lower lobe collapse Concern for right upper lobe pneumonia -CT chest/abdomen/pelvis showed moderate left pleural effusion with collapse of left lower lobe along with groundglass infiltrate within the right apex. -Started on cefepime and vancomycin on 04/23/2021.  DC vancomycin.  Continue cefepime. 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  Currently on TPN  Leukocytosis -Still has significant leukocytosis -Will monitor  Anemia of chronic disease --hemoglobin 7.3 today.  No signs of bleeding  Thrombocytosis -Possibly reactive.  Monitor  Hyponatremia -Mild -Monitor.  Subjective: Patient seen and examined at bedside.  Complains of some shortness of breath and intermittent abdominal  pain.  No overnight fever or vomiting reported.  Objective: Vitals:   04/24/21 1000 04/24/21 1025 04/24/21 1047 04/24/21 1152  BP: (!) 197/71 115/73 (!) 143/65 125/66  Pulse:   (!) 102 92  Resp:   19 12  Temp:    98.3 F (36.8 C)  TempSrc:    Oral  SpO2:   96% 93%  Weight:      Height:        Intake/Output Summary (Last 24 hours) at 04/24/2021 1202 Last data filed at 04/24/2021 0559 Gross per 24 hour  Intake 3583.44 ml  Output 1550 ml  Net 2033.44 ml   Filed Weights   04/16/21 0650 04/24/21 0427  Weight: 103.9 kg 109.4 kg    Examination:  General exam: Appears calm and comfortable.  Currently on room air. Respiratory system: Bilateral decreased breath sounds at bases with some scattered crackles Cardiovascular system: S1 & S2 heard, intermittently tachycardic gastrointestinal system: Abdomen is distended, soft and tender left upper quadrant.  Bowel sounds sluggish. Extremities: No cyanosis, clubbing similar trace lower extremity nonpresent Central nervous system: Alert and oriented. No focal neurological deficits. Moving extremities Skin: No rashes, lesions or ulcers Psychiatry: Affect is mostly flat.    Data Reviewed: I have personally reviewed following labs and imaging studies  CBC: Recent Labs  Lab 04/19/21 0241 04/19/21 1255 04/20/21 0242 04/21/21 0233 04/23/21 0500 04/24/21 0434  WBC 19.0*  --  19.8* 18.0* 16.2* 18.3*  NEUTROABS  --   --  15.8*  --   --   --   HGB 7.0* 8.6* 7.3* 7.1* 7.1* 7.3*  HCT 22.0* 27.3* 23.6* 23.0* 23.5* 23.8*  MCV 88.0  --  90.8 89.1 90.0 88.8  PLT 468*  --  512* 464* 472* 449*  Basic Metabolic Panel: Recent Labs  Lab 04/20/21 0242 04/21/21 0233 04/22/21 0405 04/23/21 0500 04/24/21 0434  NA 134* 134* 135 134* 132*  K 3.8 4.0 4.1 4.2 4.3  CL 99 99 99 101 99  CO2 27 28 27 27 26   GLUCOSE 105* 126* 125* 127* 112*  BUN 8 10 10 12 12   CREATININE 0.62 0.67 0.46* 0.57* 0.66  CALCIUM 8.1* 8.2* 8.6* 8.2* 8.2*  MG 1.8 2.0 2.0  1.9 2.0  PHOS 3.0 3.5 3.4 3.8 3.5   GFR: Estimated Creatinine Clearance: 110.6 mL/min (by C-G formula based on SCr of 0.66 mg/dL). Liver Function Tests: Recent Labs  Lab 04/20/21 0242 04/21/21 0233 04/22/21 0405 04/23/21 0500 04/24/21 0434  AST 12* 16 17 28  39  ALT 10 13 12 19 29   ALKPHOS 58 71 67 82 98  BILITOT 0.5 0.7 0.6 0.6 0.6  PROT 6.3* 6.5 6.7 6.5 6.7  ALBUMIN 2.4* 2.4* 2.4* 2.4* 2.4*   No results for input(s): LIPASE, AMYLASE in the last 168 hours. No results for input(s): AMMONIA in the last 168 hours. Coagulation Profile: Recent Labs  Lab 04/18/21 0711  INR 1.3*   Cardiac Enzymes: No results for input(s): CKTOTAL, CKMB, CKMBINDEX, TROPONINI in the last 168 hours. BNP (last 3 results) No results for input(s): PROBNP in the last 8760 hours. HbA1C: No results for input(s): HGBA1C in the last 72 hours. CBG: Recent Labs  Lab 04/23/21 1954 04/23/21 2315 04/24/21 0425 04/24/21 0751 04/24/21 1135  GLUCAP 138* 117* 113* 129* 145*   Lipid Profile: No results for input(s): CHOL, HDL, LDLCALC, TRIG, CHOLHDL, LDLDIRECT in the last 72 hours. Thyroid Function Tests: No results for input(s): TSH, T4TOTAL, FREET4, T3FREE, THYROIDAB in the last 72 hours. Anemia Panel: No results for input(s): VITAMINB12, FOLATE, FERRITIN, TIBC, IRON, RETICCTPCT in the last 72 hours. Sepsis Labs: No results for input(s): PROCALCITON, LATICACIDVEN in the last 168 hours.  Recent Results (from the past 240 hour(s))  Blood culture (routine x 2)     Status: None   Collection Time: 04/15/21 11:44 PM   Specimen: BLOOD  Result Value Ref Range Status   Specimen Description   Final    BLOOD LEFT ANTECUBITAL Performed at [facility], [address], [place], [place] [facility]    Special Requests   Final    BOTTLES DRAWN AEROBIC AND ANAEROBIC Blood Culture adequate volume Performed at [facility], [address] [place], [place]     Culture   Final    NO GROWTH 5 DAYS Performed at [facility], [address] [place], [address] [facility]    Report Status 04/21/2021 FINAL  Final  Blood culture (routine x 2)     Status: None   Collection Time: 04/15/21 11:49 PM   Specimen: BLOOD  Result Value Ref Range Status   Specimen Description   Final    BLOOD RIGHT ANTECUBITAL Performed at [facility], [address] [place], [place] [facility]    Special Requests   Final    BOTTLES DRAWN AEROBIC AND ANAEROBIC Blood Culture adequate volume Performed at [facility], [address] [place], [place]    Culture   Final    NO GROWTH 5 DAYS Performed at [facility], [address], [place], [address] [facility]    Report Status 04/21/2021 FINAL  Final  Resp Panel by RT-PCR (Flu A&B, Covid) Nasopharyngeal Swab     Status:  None   Collection Time: 04/16/21  1:29 AM   Specimen: Nasopharyngeal Swab; Nasopharyngeal(NP) swabs in vial transport medium  Result Value Ref Range Status   SARS Coronavirus 2 by RT PCR NEGATIVE NEGATIVE Final    Comment: (NOTE) SARS-CoV-2 target nucleic acids are NOT DETECTED.  The SARS-CoV-2 RNA is generally detectable in upper respiratory specimens during the acute phase of infection. The lowest concentration of SARS-CoV-2 viral copies this assay can detect is 138 copies/mL. A negative result does not preclude SARS-Cov-2 infection and should not be used as the sole basis for treatment or other patient management decisions. A negative result may occur with  improper specimen collection/handling, submission of specimen other than nasopharyngeal swab, presence of viral mutation(s) within the areas targeted by this assay, and inadequate number of viral copies(<138 copies/mL). A negative result must be combined with clinical observations, patient history, and epidemiological information. The expected result is Negative.  Fact Sheet for Patients:   BloggerCourse.com  Fact Sheet for Healthcare Providers:  SeriousBroker.it  This test is no t yet approved or cleared by the [place] FDA and  has been authorized for detection and/or diagnosis of SARS-CoV-2 by FDA under an Emergency Use Authorization (EUA). This EUA will remain  in effect (meaning this test can be used) for the duration of the COVID-19 declaration under Section 564(b)(1) of the Act, 21 U.S.C.section 360bbb-3(b)(1), unless the authorization is terminated  or revoked sooner.       Influenza A by PCR NEGATIVE NEGATIVE Final   Influenza B by PCR NEGATIVE NEGATIVE Final    Comment: (NOTE) The Xpert Xpress SARS-CoV-2/FLU/RSV plus assay is intended as an aid in the diagnosis of influenza from Nasopharyngeal swab specimens and should not be used as a sole basis for treatment. Nasal washings and aspirates are unacceptable for Xpert Xpress SARS-CoV-2/FLU/RSV testing.  Fact Sheet for Patients: BloggerCourse.com  Fact Sheet for Healthcare Providers: SeriousBroker.it  This test is not yet approved or cleared by the [place] FDA and has been authorized for detection and/or diagnosis of SARS-CoV-2 by FDA under an Emergency Use Authorization (EUA). This EUA will remain in effect (meaning this test can be used) for the duration of the COVID-19 declaration under Section 564(b)(1) of the Act, 21 U.S.C. section 360bbb-3(b)(1), unless the authorization is terminated or revoked.  Performed at [facility], [address] [place], [place] [address]   MRSA Next Gen by PCR, Nasal     Status: None   Collection Time: 04/16/21  2:37 AM   Specimen: Nasal Mucosa; Nasal Swab  Result Value Ref Range Status   MRSA by PCR Next Gen NOT DETECTED NOT DETECTED Final    Comment: (NOTE) The GeneXpert MRSA Assay (FDA approved for NASAL specimens only), is one  component of a comprehensive MRSA colonization surveillance program. It is not intended to diagnose MRSA infection nor to guide or monitor treatment for MRSA infections. Test performance is not FDA approved in patients less than 95 years old. Performed at [facility], [address] [place], [place] [address]          Radiology Studies: DG Chest 1 View  Result Date: 04/24/2021 CLINICAL DATA:  LEFT thoracentesis EXAM: CHEST  1 VIEW COMPARISON:  Chest CT 04/23/2021 FINDINGS: Post LEFT thoracentesis. No pneumothorax appreciated. Decrease in LEFT effusion. Small persistent LEFT effusion. RIGHT PICC line place.  RIGHT lung clear IMPRESSION: Post LEFT thoracentesis without pneumothorax. Electronically Signed   By: Loura Halt.D.  On: 04/24/2021 10:54   CT CHEST ABDOMEN PELVIS W CONTRAST  Result Date: 04/23/2021 CLINICAL DATA:  Chest wall pain, acute nonlocalized abdominal pain EXAM: CT CHEST, ABDOMEN, AND PELVIS WITH CONTRAST TECHNIQUE: Multidetector CT imaging of the chest, abdomen and pelvis was performed following the standard protocol during bolus administration of intravenous contrast. CONTRAST:  55mL OMNIPAQUE IOHEXOL 350 MG/ML SOLN COMPARISON:  CT abdomen pelvis 04/16/2021, CT chest 07/23/2020 FINDINGS: CT CHEST FINDINGS Cardiovascular: No significant coronary artery calcification. Global cardiac size within normal limits. No pericardial effusion. Central pulmonary arteries are of normal caliber. Mild atherosclerotic calcifications seen within the thoracic aorta. No aortic aneurysm. Right upper extremity PICC line tip is seen at the superior cavoatrial junction. Mediastinum/Nodes: Esophagus unremarkable. Thyroid unremarkable. No pathologic thoracic adenopathy. Lungs/Pleura: Interval development of a moderate left pleural effusion with complete collapse of the left lower lobe. Minimal ground-glass infiltrate within the right upper lobe at the apex is nonspecific, but  may be infectious or inflammatory in nature. Mild right basilar atelectasis. The lungs are otherwise clear. No pneumothorax. No central obstructing lesion. Musculoskeletal: No acute bone abnormality. No lytic or blastic bone lesion. CT ABDOMEN PELVIS FINDINGS Hepatobiliary: Scattered cysts and again identified within the liver. No enhancing intrahepatic mass identified. No intra or extrahepatic biliary ductal dilation. Layering high attenuation material is again seen within the gallbladder lumen possibly representing gallbladder sludge or vicarious excretion of contrast. Pancreas: Unremarkable Spleen: A a wedge like perfusion defect has developed within the mid body of the spleen likely representing a a focal infarct related to recent splenic artery coil embolization. Multiple embolization coils are seen adjacent to the distal tail of the pancreas within the mid to distal splenic artery. Large mixed attenuation subcapsular splenic hematoma is again identified and is stable in size since prior examination measuring roughly 15.7 cm in greatest dimension. Small perisplenic hemorrhage is again identified and appears stable. Hemoperitoneum has decreased in the interval since prior examination. Adrenals/Urinary Tract: The adrenal glands are unremarkable. The kidneys are normal in size and position. Tiny probable cortical cyst within the interpolar region of the left kidney. The kidneys are otherwise unremarkable. Bladder unremarkable. Stomach/Bowel: The stomach is mildly displaced from the a left upper quadrant by the large splenic hematoma, similar to that noted on prior examination a small amount of hemorrhage seen along the greater curvature of the stomach, unchanged. The stomach, small bowel, and large bowel are otherwise unremarkable. Appendectomy has been performed. No free intraperitoneal gas. Vascular/Lymphatic: Mild aortoiliac atherosclerotic calcification. No aortic aneurysm. Interval coil embolization of the a  mid to distal splenic artery. Reproductive: Prostate is unremarkable. Other: Small fluid within the deep abdominal fascia of the anterior abdominal wall subjacent to the umbilicus in keeping with recent ventral hernia repair. No recurrent abdominal wall hernia. Small right inguinal fat containing hernia noted. Mild subcutaneous edema has developed within the flanks bilaterally in keeping with mild anasarca. Musculoskeletal: No acute bone abnormality within the abdomen and pelvis. Osseous structures are age-appropriate. IMPRESSION: Interval coil embolization of the mid to distal splenic artery with development of a probable infarct within the mid body of the spleen. Stable large subcapsular splenic hematoma and moderate perisplenic hemorrhage. Decreasing hemoperitoneum. Interval development of a moderate left pleural effusion with complete collapse of the left lower lobe. Minimal ground-glass infiltrate within the right apex possibly infectious or inflammatory in nature. Surgical changes in keeping with ventral hernia repair, unchanged. Aortic Atherosclerosis (ICD10-I70.0). Electronically Signed   By: Helyn Numbers MD   On:  04/23/2021 23:18   DG CHEST PORT 1 VIEW  Result Date: 04/23/2021 CLINICAL DATA:  Sudden onset shortness of breath and left chest pain when inhaling this morning. EXAM: PORTABLE CHEST 1 VIEW COMPARISON:  04/06/2021 FINDINGS: Stable normal sized heart. Right PICC tip in the inferior aspect of the superior vena cava, approximately 1.5 cm proximal to the superior cavoatrial junction. Poor inspiration with improvement. Increased left basilar airspace opacity including dense left lower lobe opacity. Interval minimal patchy opacity at the right lung base. Moderate peribronchial thickening. Mild thoracolumbar spine degenerative changes. IMPRESSION: 1. Increased airspace opacity at the left lung base including dense left lower lobe atelectasis or pneumonia. 2. Interval small amount of right basilar  patchy atelectasis or pneumonia. 3. Moderate bronchitic changes. Electronically Signed   By: Beckie Salts M.D.   On: 04/23/2021 12:54   US THORACENTESIS ASP PLEURAL SPACE W/IMG GUIDE  Result Date: 04/24/2021 INDICATION: Patient with history of recent incisional hernia repair requiring laparotomy and small-bowel resection, ruptured spleen on 04/15/21 with coil embolization of splenic artery on 04/16/21, left pleural effusion. Request received for diagnostic and therapeutic left thoracentesis. EXAM: ULTRASOUND GUIDED DIAGNOSTIC AND THERAPEUTIC LEFT  THORACENTESIS MEDICATIONS: 1% lidocaine to skin and subcutaneous tissue COMPLICATIONS: None immediate. PROCEDURE: An ultrasound guided thoracentesis was thoroughly discussed with the patient and questions answered. The benefits, risks, alternatives and complications were also discussed. The patient understands and wishes to proceed with the procedure. Written consent was obtained. Ultrasound was performed to localize and mark an adequate pocket of fluid in the left chest. The area was then prepped and draped in the normal sterile fashion. 1% Lidocaine was used for local anesthesia. Under ultrasound guidance a 6 Fr Safe-T-Centesis catheter was introduced. Thoracentesis was performed. The catheter was removed and a dressing applied. FINDINGS: A total of approximately 400 cc of dark reddish yellow fluid was removed. Samples were sent to the laboratory as requested by the clinical team. IMPRESSION: Successful ultrasound guided diagnostic and therapeutic left thoracentesis yielding 400 cc of pleural fluid. Read by: Jeananne Rama, PA-C Electronically Signed   By: Corlis Leak M.D.   On: 04/24/2021 10:33        Scheduled Meds:  (feeding supplement) PROSource Plus  30 mL Oral Daily   acetaminophen  1,000 mg Oral Q6H   Chlorhexidine Gluconate Cloth  6 each Topical Q0600   enoxaparin (LOVENOX) injection  40 mg Subcutaneous Q24H   feeding supplement  1 Container Oral Q24H    gabapentin  300 mg Oral TID   insulin aspart  0-9 Units Subcutaneous Q4H   lidocaine       lip balm  1 application Topical BID   mouth rinse  15 mL Mouth Rinse BID   pantoprazole  40 mg Oral BID   polycarbophil  625 mg Oral BID   polyethylene glycol  17 g Oral Daily   Continuous Infusions:  ceFEPime (MAXIPIME) IV Stopped (04/24/21 1047)   ondansetron (ZOFRAN) IV     TPN ADULT (ION) 100 mL/hr at 04/24/21 0559   TPN ADULT (ION)     vancomycin            Glade Lloyd, MD [facility] 04/24/2021, 12:02 PM

## 2021-04-24 NOTE — Progress Notes (Signed)
Pharmacy Antibiotic Note  Brian Atkinson is a 70 y.o. male admitted on 04/15/2021 with complex medical history with recurrent incisional hernia  Pharmacy has been consulted to dose vancomycin and cefepime for pna  Plan: Vancomycin 2gm IV x 1 then 1gm q12h (AUC 466, Scr 0.8) Cefepime 2gm IV q8h Follow renal function and clinical course  Height: 5' 11.5" (181.6 cm) Weight: 103.9 kg (229 lb 0.9 oz) IBW/kg (Calculated) : 76.45  Temp (24hrs), Avg:98.3 F (36.8 C), Min:97.9 F (36.6 C), Max:98.8 F (37.1 C)  Recent Labs  Lab 04/18/21 0317 04/19/21 0241 04/20/21 0242 04/21/21 0233 04/22/21 0405 04/23/21 0500  WBC 18.5* 19.0* 19.8* 18.0*  --  16.2*  CREATININE 0.87  --  0.62 0.67 0.46* 0.57*    Estimated Creatinine Clearance: 107.9 mL/min (A) (by C-G formula based on SCr of 0.57 mg/dL (L)).    Allergies  Allergen Reactions   Versed [Midazolam] Other (See Comments)    Memory problems      Thank you for allowing pharmacy to be a part of this patient's care.  Arley Phenix RPh 04/24/2021, 1:07 AM

## 2021-04-25 ENCOUNTER — Inpatient Hospital Stay (HOSPITAL_COMMUNITY): Payer: Medicare Other | Admitting: Anesthesiology

## 2021-04-25 ENCOUNTER — Encounter (HOSPITAL_COMMUNITY)
Admission: EM | Disposition: A | Discharge status: Home / Self Care | Payer: Self-pay | Source: Home / Self Care | Attending: Surgery

## 2021-04-25 ENCOUNTER — Inpatient Hospital Stay (HOSPITAL_COMMUNITY): Payer: Medicare Other

## 2021-04-25 DIAGNOSIS — J90 Pleural effusion, not elsewhere classified: Secondary | ICD-10-CM | POA: Diagnosis not present

## 2021-04-25 HISTORY — PX: SPLENECTOMY, TOTAL: SHX788

## 2021-04-25 LAB — GLUCOSE, CAPILLARY
Glucose-Capillary: 128 mg/dL — ABNORMAL HIGH (ref 70–99)
Glucose-Capillary: 132 mg/dL — ABNORMAL HIGH (ref 70–99)
Glucose-Capillary: 133 mg/dL — ABNORMAL HIGH (ref 70–99)
Glucose-Capillary: 140 mg/dL — ABNORMAL HIGH (ref 70–99)
Glucose-Capillary: 141 mg/dL — ABNORMAL HIGH (ref 70–99)

## 2021-04-25 LAB — CBC
HCT: 24.7 % — ABNORMAL LOW (ref 39.0–52.0)
HCT: 24.4 % — ABNORMAL LOW (ref 39.0–52.0)
Hemoglobin: 7.4 g/dL — ABNORMAL LOW (ref 13.0–17.0)
Hemoglobin: 7 g/dL — ABNORMAL LOW (ref 13.0–17.0)
MCH: 26.8 pg (ref 26.0–34.0)
MCH: 28.3 pg (ref 26.0–34.0)
MCHC: 30 g/dL (ref 30.0–36.0)
MCHC: 28.7 g/dL — ABNORMAL LOW (ref 30.0–36.0)
MCV: 89.5 fL (ref 80.0–100.0)
MCV: 98.8 fL (ref 80.0–100.0)
Platelets: 489 10*3/uL — ABNORMAL HIGH (ref 150–400)
Platelets: 482 10*3/uL — ABNORMAL HIGH (ref 150–400)
RBC: 2.76 MIL/uL — ABNORMAL LOW (ref 4.22–5.81)
RBC: 2.47 MIL/uL — ABNORMAL LOW (ref 4.22–5.81)
RDW: 16.2 % — ABNORMAL HIGH (ref 11.5–15.5)
RDW: 17.3 % — ABNORMAL HIGH (ref 11.5–15.5)
WBC: 18 10*3/uL — ABNORMAL HIGH (ref 4.0–10.5)
WBC: 18.9 10*3/uL — ABNORMAL HIGH (ref 4.0–10.5)
nRBC: 0.3 % — ABNORMAL HIGH (ref 0.0–0.2)
nRBC: 0.2 % (ref 0.0–0.2)

## 2021-04-25 LAB — PREALBUMIN: Prealbumin: 7.3 mg/dL — ABNORMAL LOW (ref 18–38)

## 2021-04-25 LAB — COMPREHENSIVE METABOLIC PANEL
ALT: 32 U/L (ref 0–44)
AST: 40 U/L (ref 15–41)
Albumin: 2.4 g/dL — ABNORMAL LOW (ref 3.5–5.0)
Alkaline Phosphatase: 113 U/L (ref 38–126)
Anion gap: 9 (ref 5–15)
BUN: 15 mg/dL (ref 8–23)
CO2: 24 mmol/L (ref 22–32)
Calcium: 8.2 mg/dL — ABNORMAL LOW (ref 8.9–10.3)
Chloride: 100 mmol/L (ref 98–111)
Creatinine, Ser: 0.57 mg/dL — ABNORMAL LOW (ref 0.61–1.24)
GFR, Estimated: >60 mL/min (ref >60)
Glucose, Bld: 136 mg/dL — ABNORMAL HIGH (ref 70–99)
Potassium: 4.3 mmol/L (ref 3.5–5.1)
Sodium: 133 mmol/L — ABNORMAL LOW (ref 135–145)
Total Bilirubin: 0.7 mg/dL (ref 0.3–1.2)
Total Protein: 6.8 g/dL (ref 6.5–8.1)

## 2021-04-25 LAB — DIFFERENTIAL
Abs Immature Granulocytes: 0.99 10*3/uL — ABNORMAL HIGH (ref 0.00–0.07)
Basophils Absolute: 0.1 10*3/uL (ref 0.0–0.1)
Basophils Relative: 1 %
Eosinophils Absolute: 0.4 10*3/uL (ref 0.0–0.5)
Eosinophils Relative: 2 %
Immature Granulocytes: 6 %
Lymphocytes Relative: 6 %
Lymphs Abs: 1.1 10*3/uL (ref 0.7–4.0)
Monocytes Absolute: 2.2 10*3/uL — ABNORMAL HIGH (ref 0.1–1.0)
Monocytes Relative: 12 %
Neutro Abs: 13.3 10*3/uL — ABNORMAL HIGH (ref 1.7–7.7)
Neutrophils Relative %: 73 %

## 2021-04-25 LAB — POCT I-STAT 7, (LYTES, BLD GAS, ICA,H+H)
Acid-Base Excess: 0 mmol/L (ref 0.0–2.0)
Bicarbonate: 25.2 mmol/L (ref 20.0–28.0)
Calcium, Ion: 1.13 mmol/L — ABNORMAL LOW (ref 1.15–1.40)
HCT: 24 % — ABNORMAL LOW (ref 39.0–52.0)
Hemoglobin: 8.2 g/dL — ABNORMAL LOW (ref 13.0–17.0)
O2 Saturation: 98 %
Potassium: 4.5 mmol/L (ref 3.5–5.1)
Sodium: 134 mmol/L — ABNORMAL LOW (ref 135–145)
TCO2: 26 mmol/L (ref 22–32)
pCO2 arterial: 41.2 mmHg (ref 32.0–48.0)
pH, Arterial: 7.395 (ref 7.350–7.450)
pO2, Arterial: 106 mmHg (ref 83.0–108.0)

## 2021-04-25 LAB — PREPARE RBC (CROSSMATCH)

## 2021-04-25 LAB — MAGNESIUM: Magnesium: 1.9 mg/dL (ref 1.7–2.4)

## 2021-04-25 LAB — PHOSPHORUS: Phosphorus: 3.5 mg/dL (ref 2.5–4.6)

## 2021-04-25 LAB — TRIGLYCERIDES: Triglycerides: 64 mg/dL (ref <150)

## 2021-04-25 SURGERY — SPLENECTOMY
Anesthesia: General | Site: Abdomen

## 2021-04-25 MED ORDER — ONDANSETRON HCL 4 MG/2ML IJ SOLN
INTRAMUSCULAR | Status: AC
  Filled 2021-04-25: qty 2

## 2021-04-25 MED ORDER — IOHEXOL 350 MG/ML SOLN
100.0000 mL | Freq: Once | INTRAVENOUS | Status: AC | PRN
  Administered 2021-04-25: 80 mL via INTRAVENOUS

## 2021-04-25 MED ORDER — BUPIVACAINE LIPOSOME 1.3 % IJ SUSP
20.0000 mL | Freq: Once | INTRAMUSCULAR | Status: AC
  Administered 2021-04-25: 20 mL
  Filled 2021-04-25: qty 20

## 2021-04-25 MED ORDER — ROCURONIUM BROMIDE 10 MG/ML (PF) SYRINGE
PREFILLED_SYRINGE | INTRAVENOUS | Status: DC | PRN
  Administered 2021-04-25: 50 mg via INTRAVENOUS

## 2021-04-25 MED ORDER — SODIUM CHLORIDE 0.9% IV SOLUTION: Freq: Once | INTRAVENOUS | Status: DC

## 2021-04-25 MED ORDER — HYDROMORPHONE HCL 1 MG/ML IJ SOLN
0.2500 mg | INTRAMUSCULAR | Status: DC | PRN
  Administered 2021-04-25 (×4): 0.5 mg via INTRAVENOUS

## 2021-04-25 MED ORDER — SODIUM CHLORIDE 0.9 % IV SOLN: 10.0000 mL/h | Freq: Once | INTRAVENOUS | Status: DC

## 2021-04-25 MED ORDER — ACETAMINOPHEN 10 MG/ML IV SOLN: 1000.0000 mg | Freq: Once | INTRAVENOUS | Status: DC | PRN

## 2021-04-25 MED ORDER — LIDOCAINE 2% (20 MG/ML) 5 ML SYRINGE
INTRAMUSCULAR | Status: DC | PRN
  Administered 2021-04-25: 100 mg via INTRAVENOUS

## 2021-04-25 MED ORDER — MIDAZOLAM HCL 2 MG/2ML IJ SOLN
INTRAMUSCULAR | Status: AC
  Filled 2021-04-25: qty 2

## 2021-04-25 MED ORDER — SUGAMMADEX SODIUM 500 MG/5ML IV SOLN
INTRAVENOUS | Status: DC | PRN
  Administered 2021-04-25: 200 mg via INTRAVENOUS

## 2021-04-25 MED ORDER — ETOMIDATE 2 MG/ML IV SOLN
INTRAVENOUS | Status: DC | PRN
  Administered 2021-04-25: 16 mg via INTRAVENOUS

## 2021-04-25 MED ORDER — LACTATED RINGERS IV SOLN: Freq: Once | INTRAVENOUS | Status: AC

## 2021-04-25 MED ORDER — KETAMINE HCL 10 MG/ML IJ SOLN
INTRAMUSCULAR | Status: DC | PRN
  Administered 2021-04-25: 20 mg via INTRAVENOUS

## 2021-04-25 MED ORDER — LACTATED RINGERS IV SOLN: INTRAVENOUS | Status: DC

## 2021-04-25 MED ORDER — ONDANSETRON HCL 4 MG/2ML IJ SOLN: 4.0000 mg | Freq: Once | INTRAMUSCULAR | Status: DC | PRN

## 2021-04-25 MED ORDER — FENTANYL CITRATE (PF) 100 MCG/2ML IJ SOLN
INTRAMUSCULAR | Status: DC | PRN
  Administered 2021-04-25 (×2): 50 ug via INTRAVENOUS

## 2021-04-25 MED ORDER — PHENYLEPHRINE HCL-NACL 10-0.9 MG/250ML-% IV SOLN
INTRAVENOUS | Status: DC | PRN
  Administered 2021-04-25: 50 ug/min via INTRAVENOUS

## 2021-04-25 MED ORDER — HYDROMORPHONE HCL 1 MG/ML IJ SOLN
INTRAMUSCULAR | Status: AC
  Filled 2021-04-25: qty 2

## 2021-04-25 MED ORDER — LIDOCAINE 2% (20 MG/ML) 5 ML SYRINGE
INTRAMUSCULAR | Status: AC
  Filled 2021-04-25: qty 5

## 2021-04-25 MED ORDER — SODIUM CHLORIDE 0.9 % IV SOLN
INTRAVENOUS | Status: AC
  Filled 2021-04-25: qty 2

## 2021-04-25 MED ORDER — KETAMINE HCL 10 MG/ML IJ SOLN
INTRAMUSCULAR | Status: AC
  Filled 2021-04-25: qty 1

## 2021-04-25 MED ORDER — HYDROMORPHONE HCL 1 MG/ML IJ SOLN
INTRAMUSCULAR | Status: AC
  Filled 2021-04-25: qty 1

## 2021-04-25 MED ORDER — TRAVASOL 10 % IV SOLN
INTRAVENOUS | Status: AC
  Filled 2021-04-25: qty 1200

## 2021-04-25 MED ORDER — DEXAMETHASONE SODIUM PHOSPHATE 10 MG/ML IJ SOLN
INTRAMUSCULAR | Status: AC
  Filled 2021-04-25: qty 1

## 2021-04-25 MED ORDER — FENTANYL CITRATE (PF) 100 MCG/2ML IJ SOLN
INTRAMUSCULAR | Status: AC
  Filled 2021-04-25: qty 2

## 2021-04-25 MED ORDER — BUPIVACAINE-EPINEPHRINE (PF) 0.25% -1:200000 IJ SOLN
INTRAMUSCULAR | Status: AC
  Filled 2021-04-25: qty 30

## 2021-04-25 MED ORDER — SUCCINYLCHOLINE CHLORIDE 200 MG/10ML IV SOSY
PREFILLED_SYRINGE | INTRAVENOUS | Status: DC | PRN
  Administered 2021-04-25: 140 mg via INTRAVENOUS

## 2021-04-25 MED ORDER — LACTATED RINGERS IV SOLN
INTRAVENOUS | Status: DC | PRN
Start: 2021-04-25 — End: 2021-04-25

## 2021-04-25 MED ORDER — BUPIVACAINE-EPINEPHRINE (PF) 0.25% -1:200000 IJ SOLN
INTRAMUSCULAR | Status: DC | PRN
  Administered 2021-04-25: 30 mL

## 2021-04-25 MED ORDER — INSULIN ASPART 100 UNIT/ML IJ SOLN
0.0000 [IU] | Freq: Three times a day (TID) | INTRAMUSCULAR | Status: DC
  Administered 2021-04-25 – 2021-05-01 (×9): 1 [IU] via SUBCUTANEOUS

## 2021-04-25 MED ORDER — ALBUMIN HUMAN 5 % IV SOLN: INTRAVENOUS | Status: DC | PRN

## 2021-04-25 MED ORDER — PROPOFOL 10 MG/ML IV BOLUS
INTRAVENOUS | Status: AC
  Filled 2021-04-25: qty 20

## 2021-04-25 MED ORDER — FLEET ENEMA 7-19 GM/118ML RE ENEM
1.0000 | ENEMA | Freq: Once | RECTAL | Status: DC
  Filled 2021-04-25: qty 1

## 2021-04-25 MED ORDER — 0.9 % SODIUM CHLORIDE (POUR BTL) OPTIME
TOPICAL | Status: DC | PRN
  Administered 2021-04-25: 5000 mL

## 2021-04-25 MED ORDER — ROCURONIUM BROMIDE 10 MG/ML (PF) SYRINGE
PREFILLED_SYRINGE | INTRAVENOUS | Status: AC
  Filled 2021-04-25: qty 10

## 2021-04-25 SURGICAL SUPPLY — 50 items
ADH SKN CLS APL DERMABOND .7 (GAUZE/BANDAGES/DRESSINGS) ×1
APL PRP STRL LF DISP 70% ISPRP (MISCELLANEOUS) ×1
BAG COUNTER SPONGE SURGICOUNT (BAG) IMPLANT
BAG SPNG CNTER NS LX DISP (BAG)
CHLORAPREP W/TINT 26 (MISCELLANEOUS) ×1 IMPLANT
COVER MAYO STAND STRL (DRAPES) ×2 IMPLANT
COVER SURGICAL LIGHT HANDLE (MISCELLANEOUS) ×2 IMPLANT
DERMABOND ADVANCED (GAUZE/BANDAGES/DRESSINGS) ×1
DERMABOND ADVANCED .7 DNX12 (GAUZE/BANDAGES/DRESSINGS) IMPLANT
DRAIN CHANNEL 19F RND (DRAIN) ×1 IMPLANT
DRAPE LAPAROSCOPIC ABDOMINAL (DRAPES) ×2 IMPLANT
DRSG OPSITE POSTOP 4X10 (GAUZE/BANDAGES/DRESSINGS) IMPLANT
DRSG OPSITE POSTOP 4X6 (GAUZE/BANDAGES/DRESSINGS) IMPLANT
DRSG OPSITE POSTOP 4X8 (GAUZE/BANDAGES/DRESSINGS) IMPLANT
ELECT REM PT RETURN 15FT ADLT (MISCELLANEOUS) ×2 IMPLANT
EVACUATOR SILICONE 100CC (DRAIN) ×1 IMPLANT
GLOVE SURG ENC MOIS LTX SZ6 (GLOVE) ×4 IMPLANT
GLOVE SURG UNDER LTX SZ6.5 (GLOVE) ×4 IMPLANT
GOWN STRL REUS W/TWL LRG LVL3 (GOWN DISPOSABLE) ×2 IMPLANT
GOWN STRL REUS W/TWL XL LVL3 (GOWN DISPOSABLE) ×2 IMPLANT
HEMOSTAT ARISTA ABSORB 3G PWDR (HEMOSTASIS) ×1 IMPLANT
KIT TURNOVER KIT A (KITS) ×2 IMPLANT
PACK GENERAL/GYN (CUSTOM PROCEDURE TRAY) ×2 IMPLANT
PENCIL SMOKE EVACUATOR (MISCELLANEOUS) IMPLANT
RELOAD STAPLE 60 2.6 WHT THN (STAPLE) IMPLANT
RELOAD STAPLER WHITE 60MM (STAPLE) ×2 IMPLANT
SEALER TISSUE X1 CVD JAW (INSTRUMENTS) ×1 IMPLANT
SPONGE DRAIN TRACH 4X4 STRL 2S (GAUZE/BANDAGES/DRESSINGS) ×2 IMPLANT
SPONGE T-LAP 18X18 ~~LOC~~+RFID (SPONGE) ×2 IMPLANT
STAPLER ECHELON LONG 60 440 (INSTRUMENTS) ×2 IMPLANT
STAPLER RELOAD WHITE 60MM (STAPLE) ×4
STAPLER VISISTAT 35W (STAPLE) IMPLANT
SUT ETHILON 3 0 PS 1 (SUTURE) ×1 IMPLANT
SUT MNCRL AB 4-0 PS2 18 (SUTURE) IMPLANT
SUT NOVA T20/GS 25 (SUTURE) IMPLANT
SUT PDS AB 2-0 CT2 27 (SUTURE) ×2 IMPLANT
SUT SILK 2 0 (SUTURE) ×2
SUT SILK 2 0 SH CR/8 (SUTURE) ×2 IMPLANT
SUT SILK 2-0 18XBRD TIE 12 (SUTURE) ×1 IMPLANT
SUT SILK 3 0 (SUTURE) ×2
SUT SILK 3 0 SH CR/8 (SUTURE) ×2 IMPLANT
SUT SILK 3-0 18XBRD TIE 12 (SUTURE) ×1 IMPLANT
SUT VIC AB 0 CT1 27 (SUTURE) ×2
SUT VIC AB 0 CT1 27XBRD ANBCTR (SUTURE) IMPLANT
SUT VIC AB 0 CT1 36 (SUTURE) ×1 IMPLANT
SUT VIC AB 2-0 CT1 18 (SUTURE) ×1 IMPLANT
TOWEL OR 17X26 10 PK STRL BLUE (TOWEL DISPOSABLE) IMPLANT
TOWEL OR NON WOVEN STRL DISP B (DISPOSABLE) ×2 IMPLANT
TRAY FOLEY MTR SLVR 14FR STAT (SET/KITS/TRAYS/PACK) ×2 IMPLANT
TRAY FOLEY MTR SLVR 16FR STAT (SET/KITS/TRAYS/PACK) ×1 IMPLANT

## 2021-04-25 NOTE — Op Note (Signed)
Patient: Brian Atkinson (1951/06/08, [id number])  Date of Surgery: 04/25/2021   Preoperative Diagnosis: SPLENIC RUPTURE   Postoperative Diagnosis: SPLENIC RUPTURE   Surgical Procedure: SPLENECTOMY  Operative Team Members:  Surgeon(s) and Role:    * Raeya Merritts, Hyman Hopes, MD - Primary    * Cliffton Asters Stephanie Coup, MD - Assisting   Anesthesiologist: Eilene Ghazi, MD CRNA: Illene Silver, CRNA; Basilio Cairo, CRNA   Anesthesia: General   Fluids:  Total I/O In: 2880 [I.V.:2000; Blood:630; IV Piggyback:250] Out: 300 [Urine:200; Blood:100]  Complications: none  Drains:  (19 Fr) Jackson-Pratt drain(s) with closed bulb suction in the splenic fossa exiting the left abdomen    Specimen:  ID Type Source Tests Collected by Time Destination  1 : SPLEEN Tissue PATH Spleen SURGICAL PATHOLOGY Brian Atkinson, Hyman Hopes, MD 04/25/2021 2004      Disposition:  PACU - hemodynamically stable.  Plan of Care: Admit to inpatient     Indications for Procedure: Brian Atkinson is a 70 year old male who underwent recurrent incisional hernia repair with removal of previous intraperitoneal mesh and rives stoppa style repair.  His posterior layer broke down where the previous mesh had been removed requiring takeback to the operating room for diagnostic laparotomy converted to exploratory laparotomy, small bowel resection with vicryl mesh reconstruction of the posterior layer and phasix retromuscular mesh placement on 04/04/2021. He recovered from this in the hospital, with a prolonged postoperative ileus which was not unexpected due to the large interparietal hernia.  A CT guided drain was placed on 04/13/21, to drain a pelvic fluid collection.  He was discharged on 04/15/21.  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  On 04/16/21, he underwent coil embolization of the splenic artery.  He failed to make much progress towards recovery.  He  was still on TPN, unable to tolerate a diet, in pain, tachycardic and anemic.  Decision was made to proceed with splenectomy.  The procedure itself as well as its risks, benefits and alternatives were discussed with the patient who granted consent to proceed.  We proceeded to the OR emergently.    Findings: Ruptured spleen  Infection status: Patient: Private patient, emergent case Case: Emergent Infection Present At Time Of Surgery (PATOS): None, though there were signs of beginnings of a splenic abscess on preoperative CT scan.   Description of Procedure:   On the date stated above the patient is taken the operating room suite and placed in supine position.  General endotracheal anesthesia was induced.  Antibiotics were given prior to the case start.  SCDs were placed in the lower extremities and a Foley was placed.  The patient's abdomen was prepped and draped in usual sterile fashion.  A timeout was completed verifying the correct patient, procedure, position, and equipment needed for the case.  A left subcostal incision was made and we entered the peritoneum without any trauma the underlying viscera.  The Bookwalter retractor was positioned.  The left upper quadrant was inspected.  There was a massive amount of clot and adhesions in the left upper quadrant.  We worked to evacuate the clot and break up the adhesions.  We are able to identify the omentum which was divided and the splenocolic and gastrocolic ligaments were divided.  We entered the lesser sac as we divided the gastrocolic ligament and we divided the short gastric vessels using the bipolar energy along the greater curve of the stomach.  The splenocolic ligament was divided using bipolar energy.  The splenic flexure of the colon was fully mobilized and retracted inferiorly.  The lateral attachments of the spleen to the abdominal sidewall and diaphragm were lysed bluntly by putting my paw behind the spleen and retracting medially.  I  continue to break these adhesions up until I could encircle the splenic hilum with my hand.  Three white loads of the endoscopic linear 60 mm stapler were used to divide the splenic hilum and fully mobilized the spleen from the remaining attachments.  Attempt was made to protect the tail of the pancreas, however it was hard to identify anatomy due to the ruptured spleen and dense adhesions and clot in the left upper quadrant.  The spleen was removed and passed off the field as specimen.  We packed off the left upper quadrant and then slowly remove the packs.  We inspected the left upper quadrant for hemostasis.  There was no surgical bleeding, there was some general oozing from raw surfaces.  The left upper quadrant was irrigated copiously.  Air was insufflated into the nasogastric tube to ensure there is no gastric injury, there was no bubbling of air through the irrigation suggesting no injury.  The left upper quadrant was irrigated multiple times attempt to evacuate all of the clot from the left upper quadrant.  Arista topical hemostatic agent was sprayed into the left upper quadrant.  A 19 French round channel drain was brought through the left abdomen and placed in the splenic fossa and connected to suction.  It was secured to the skin using nylon suture.  The left subcostal incision was closed in layers, 0 Vicryl closing the peritoneum and posterior fascial layer, and 2-0 PDS closing the anterior fascia.  2-0 Vicryl was used to reapproximate subcutaneous tissues and 4-0 Monocryl was used to run the subcuticular layer of the incision closed.  Dermabond was applied.  All sponge and needle counts are correct in this case.  Ivar Drape, MD General, Bariatric, & Minimally Invasive Surgery [facility], [place]

## 2021-04-25 NOTE — Anesthesia Preprocedure Evaluation (Addendum)
Anesthesia Evaluation  Patient identified by MRN, date of birth, ID band Patient awake  General Assessment Comment:70 year old male with history of recurrent incisional hernia which was repaired a month ago with intraperitoneal mesh with subsequent need for laparotomy and small bowel resection and mesh reconstruction followed by postop ileus and pelvic fluid collection requiring CT-guided drainage and subsequent discharge was readmitted on 04/15/2021 under general surgery's service with ruptured spleen requiring Cholybar ligation of splenic artery on 04/16/2021.  GI service was consulted on 04/23/2021 for shortness of breath and pleuritic chest pain with chest x-ray suggestive of possible pneumonia.  He was started on IV antibiotics.  CT chest/abdomen/pelvis showed moderate left pleural effusion with collapse of left lower lobe along with groundglass infiltrate within the right apex.  Assessment & Plan:   Moderate left pleural effusion with left lower lobe collapse Concern for right upper lobe pneumonia -CT chest/abdomen/pelvis showed moderate left pleural effusion with collapse of left lower lobe along with groundglass infiltrate within the right apex. -Started on cefepime and vancomycin on 04/23/2021.  DC'd vancomycin on 04/24/2021.  Continue cefepime. -Pulmonary following. S/P US guided thoracentesis and removal of 400cc fluid on 04/24/2021 by IR.  Splenic rupture s/p embolization with large subcapsular splenic hematoma Ileus  Reviewed: Allergy & Precautions, NPO status , Patient's Chart, lab work & pertinent test results  Airway Mallampati: III  TM Distance: <3 FB Neck ROM: Full    Dental no notable dental hx.    Pulmonary neg pulmonary ROS,    Pulmonary exam normal breath sounds clear to auscultation + decreased breath sounds      Cardiovascular negative cardio ROS   Rhythm:Regular Rate:Tachycardia     Neuro/Psych negative  neurological ROS  negative psych ROS   GI/Hepatic negative GI ROS, Neg liver ROS,   Endo/Other  negative endocrine ROS  Renal/GU Renal disease  negative genitourinary   Musculoskeletal negative musculoskeletal ROS (+)   Abdominal   Peds negative pediatric ROS (+)  Hematology  (+) anemia ,   Anesthesia Other Findings   Reproductive/Obstetrics negative OB ROS                             Anesthesia Physical Anesthesia Plan  ASA: 3 and emergent  Anesthesia Plan: General   Post-op Pain Management:    Induction: Intravenous and Rapid sequence  PONV Risk Score and Plan: 2 and Ondansetron, Dexamethasone and Treatment may vary due to age or medical condition  Airway Management Planned: Oral ETT and Video Laryngoscope Planned  Additional Equipment:   Intra-op Plan:   Post-operative Plan: Possible Post-op intubation/ventilation  Informed Consent: I have reviewed the patients History and Physical, chart, labs and discussed the procedure including the risks, benefits and alternatives for the proposed anesthesia with the patient or authorized representative who has indicated his/her understanding and acceptance.     Dental advisory given  Plan Discussed with: CRNA and Surgeon  Anesthesia Plan Comments:        Anesthesia Quick Evaluation

## 2021-04-25 NOTE — Progress Notes (Signed)
Patient failing to improve after splenic embolization.  Continued pain, intolerance of oral intake, now some signs of possible splenic abscess forming.  I discussed this with the patient.  We discussed the option to proceed with splenectomy.  This will be complicated by his recent hernia repairs, however I believe a left subcostal incision would avoid issues with the mesh, but make exploring the rest of the abdomen more difficult.  I don't see him improving quickly without surgery.  After a full discussion of the risks, benefits and alternatives the patient consented to proceed. We will proceed to the operating room emergently.

## 2021-04-25 NOTE — Progress Notes (Signed)
Progress Note: General Surgery Service   Chief Complaint/Subjective: Continued abdominal pain, acute worsening of abdominal pain this morning.  CXR reviewed.  Pain specifically worse with deep breathing and on right side into chest.  Objective: Vital signs in last 24 hours: Temp:  [98 F (36.7 C)-99.5 F (37.5 C)] 98 F (36.7 C) (07/18 0800) Pulse Rate:  [92-118] 110 (07/18 0621) Resp:  [11-20] 16 (07/18 0621) BP: (125-160)/(65-97) 154/82 (07/18 0621) SpO2:  [92 %-98 %] 96 % (07/18 0621) Last BM Date: 04/24/21  Intake/Output from previous day: 07/17 0701 - 07/18 0700 In: 2567.4 [I.V.:2271.3; IV Piggyback:296.1] Out: 1250 [Urine:1250] Intake/Output this shift: Total I/O In: -  Out: 300 [Urine:300]  GI: Abd Distended, incision c/d/I w/ staples healing   Lab Results: CBC  Recent Labs    04/24/21 0434 04/25/21 0340  WBC 18.3* 18.0*  HGB 7.3* 7.4*  HCT 23.8* 24.7*  PLT 449* 489*    BMET Recent Labs    04/24/21 0434 04/25/21 0340  NA 132* 133*  K 4.3 4.3  CL 99 100  CO2 26 24  GLUCOSE 112* 136*  BUN 12 15  CREATININE 0.66 0.57*  CALCIUM 8.2* 8.2*    PT/INR No results for input(s): LABPROT, INR in the last 72 hours.  ABG No results for input(s): PHART, HCO3 in the last 72 hours.  Invalid input(s): PCO2, PO2  Anti-infectives: Anti-infectives (From admission, onward)    Start     Dose/Rate Route Frequency Ordered Stop   04/24/21 1400  vancomycin (VANCOREADY) IVPB 1000 mg/200 mL  Status:  Discontinued        1,000 mg 200 mL/hr over 60 Minutes Intravenous Every 12 hours 04/24/21 0229 04/24/21 1326   04/24/21 0200  vancomycin (VANCOREADY) IVPB 2000 mg/400 mL        2,000 mg 200 mL/hr over 120 Minutes Intravenous  Once 04/24/21 0102 04/24/21 0324   04/24/21 0200  ceFEPIme (MAXIPIME) 2 g in sodium chloride 0.9 % 100 mL IVPB        2 g 200 mL/hr over 30 Minutes Intravenous Every 8 hours 04/24/21 0102     04/16/21 1800  piperacillin-tazobactam (ZOSYN) IVPB  3.375 g  Status:  Discontinued       Note to Pharmacy: Pharmacy may adjust dosing strength, schedule, rate of infusion, etc as needed to optimize therapy   3.375 g 12.5 mL/hr over 240 Minutes Intravenous Every 8 hours 04/16/21 1640 04/18/21 1014   04/16/21 1730  piperacillin-tazobactam (ZOSYN) IVPB 3.375 g  Status:  Discontinued       Note to Pharmacy: Pharmacy may adjust dosing strength, schedule, rate of infusion, etc as needed to optimize therapy   3.375 g 12.5 mL/hr over 240 Minutes Intravenous Every 8 hours 04/16/21 1640 04/16/21 1640   04/16/21 1000  cefTRIAXone (ROCEPHIN) 2 g in sodium chloride 0.9 % 100 mL IVPB  Status:  Discontinued       Note to Pharmacy: Pharmacy may adjust dosing strength / duration / interval for maximal efficacy   2 g 200 mL/hr over 30 Minutes Intravenous Every 24 hours 04/16/21 0828 04/16/21 1640   04/16/21 1000  metroNIDAZOLE (FLAGYL) IVPB 500 mg  Status:  Discontinued        500 mg 100 mL/hr over 60 Minutes Intravenous Every 6 hours 04/16/21 0828 04/16/21 1640   04/16/21 0100  piperacillin-tazobactam (ZOSYN) IVPB 3.375 g        3.375 g 12.5 mL/hr over 240 Minutes Intravenous  Once 04/16/21 0057 04/16/21 0143  Medications: Scheduled Meds:  (feeding supplement) PROSource Plus  30 mL Oral Daily   acetaminophen  1,000 mg Oral Q6H   Chlorhexidine Gluconate Cloth  6 each Topical Q0600   enoxaparin (LOVENOX) injection  40 mg Subcutaneous Q24H   feeding supplement  1 Container Oral Q24H   gabapentin  300 mg Oral TID   insulin aspart  0-9 Units Subcutaneous Q8H   lip balm  1 application Topical BID   mouth rinse  15 mL Mouth Rinse BID   pantoprazole  40 mg Oral BID   polycarbophil  625 mg Oral BID   polyethylene glycol  17 g Oral Daily   Continuous Infusions:  ceFEPime (MAXIPIME) IV 2 g (04/25/21 1112)   ondansetron (ZOFRAN) IV     TPN ADULT (ION) 100 mL/hr at 04/25/21 0500   TPN ADULT (ION)     PRN Meds:.alum & mag hydroxide-simeth, bisacodyl,  diphenhydrAMINE, HYDROmorphone (DILAUDID) injection, magic mouthwash, menthol-cetylpyridinium, ondansetron (ZOFRAN) IV **OR** ondansetron (ZOFRAN) IV, ondansetron **OR** [DISCONTINUED] ondansetron (ZOFRAN) IV, oxyCODONE, phenol, prochlorperazine, simethicone  Assessment/Plan: [name] is a 69 year old male who underwent recurrent incisional hernia repair with removal of previous intraperitoneal mesh and rives stoppa style repair.  His posterior layer broke down where the previous mesh had been removed requiring takeback to the operating room for diagnostic laparotomy converted to exploratory laparotomy, small bowel resection with vicryl mesh reconstruction of the posterior layer and phasix retromuscular mesh placement on 04/04/2021. He recovered from this in the hospital, with a prolonged postoperative ileus which was not unexpected due to the large interparietal hernia.  A CT guided drain was placed on 04/13/21, to drain a pelvic fluid collection.  He was discharged on 04/15/21.  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  On 04/16/21, he underwent coil embolization of the splenic artery.  He is now recovering in the step-down unit.  Not tolerating diet likely due to enlarged splenic capsule pressing on stomach Sips of CLD, TPN,  Leukocytosis likely 2/2 splenectomy, monitor for other signs of infection, ATBX started by medical team - defer to their management IR thoracentesis yesterday with 400cc reactive effusion drained Check CBC this afternoon due to increased abdominal pain  Family with significant anxiety, difficult situation, expect prolonged recovery from splenic rupture after IR embolization     LOS: 9 days   FEN: Sips of CLD, TPN ID: None VTE: SCDs, lovenox Foley: None Dispo: Continued care in stepdown    Quentin Ore, MD  [facility] Use [email or web address] to contact on call provider

## 2021-04-25 NOTE — Transfer of Care (Signed)
Immediate Anesthesia Transfer of Care Note  Patient: Brian Atkinson  Procedure(s) Performed: Procedure(s): SPLENECTOMY (N/A)  Patient Location: PACU  Anesthesia Type:General  Level of Consciousness: Alert, Awake, Oriented  Airway & Oxygen Therapy: Patient Spontanous Breathing  Post-op Assessment: Report given to RN  Post vital signs: Reviewed and stable  Last Vitals:  Vitals:   04/25/21 1700 04/25/21 1713  BP:    Pulse: (!) 109 (!) 106  Resp: 14   Temp:    SpO2: 95% 97%    Complications: No apparent anesthesia complications

## 2021-04-25 NOTE — Progress Notes (Signed)
Patient with an intra-abdominal process-we exposed small bowel resection and mesh reconstruction, post splenic artery embolization  Hematoma around the spleen could be contributing to pain but pain appears to be worsening with increasing abdominal distention  Abdomen is more distended, more painful, new fever  Will order CT abdomen with contrast, no oral contrast

## 2021-04-25 NOTE — Progress Notes (Signed)
PHARMACY - TOTAL PARENTERAL NUTRITION CONSULT NOTE   Indication: Prolonged ileus  Patient Measurements: Height: 5' 11.5" (181.6 cm) Weight: 109.4 kg (241 lb 2.9 oz) IBW/kg (Calculated) : 76.45   Body mass index is 33.17 kg/m.  Recent Labs    04/24/21 0434 04/25/21 0340  NA 132* 133*  K 4.3 4.3  CL 99 100  CO2 26 24  GLUCOSE 112* 136*  BUN 12 15  CREATININE 0.66 0.57*  CALCIUM 8.2* 8.2*  PHOS 3.5 3.5  MG 2.0 1.9  ALBUMIN 2.4* 2.4*  ALKPHOS 98 113  AST 39 40  ALT 29 32  BILITOT 0.6 0.7  TRIG  --  64  PREALBUMIN  --  7.3*     Assessment:  70 yo male s/p ventral hernia repair with mesh on 6/20. 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 He had a postoperative ileus for which he was on TPN. He also developed a pelvic abscess which was drained by IR on 7/6. His surgical retrorectus drain was removed on 7/7. Prior to discharge he was tolerating a diet and TPN was stopped. He was discharged home 7/8.  He was readmitted 7/9 with hypotension, AKI and leukocytosis.  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  On 04/16/21, he underwent coil embolization of the splenic artery.  He has not been tolerating diet, so Pharmacy has been consulted to start TPN.  Glucose / Insulin: No hx DM - CBGs 113-145 on SSI q4h  - 4 units required in past 24h Electrolytes: WNL except Na 133, Mag down to 1.9 (goal >2) Renal: BUN, SCr stable Hepatic: WNL Prealb: 10.5 (7/13), 7.3 (7/18) TG: 85 (7/13), 64 (7/18) Intake / Output: UOP 1250 mL; net I/O +1300 mL but no PO intake charted.   MIVF: none GI Imaging: GI Surgeries / Procedures: - 6/20: repair of recurrent ventral hernia with mesh - 6/27: retromuscular recurrent incisional hernia repair with vicral  mesh, SB resection - 7/6: CT guided pelvic fluid collection drain placement - 7/9: coil embolization of splenic artery -7/14: pelvic drain removed at bedside  Central access: PICC 7/13 TPN start date: 7/13  Nutritional Goals (updated RD recommendation on 7/13): kCal: 2250-2350, Protein: 115-130, Fluid: 2.4L/day Goal TPN rate is 100 mL/hr (provides 120 g of protein and 2347 kcals per day)  Current Nutrition:  Clear liquids Boost / Resource Breeze q24h ProSource 30 mL daily  Plan:  Continue TPN at goal rate of 100 mL/hr at 1800 Electrolytes in TPN:  Na to 149mEq/L  K 36mEq/L Ca 12mEq/L  Mg 7 mEq/L  Phos 44mmol/L  Cl:Ac 1:2 Add standard MVI and trace elements to TPN Continue Sensitive SSI decreased to q8h and adjust as needed  IVF per MD - none currently Monitor TPN labs on Mon/Thurs   Lynann Beaver PharmD, BCPS Clinical Pharmacist [facility] main pharmacy [phone] 04/25/2021 10:32 AM

## 2021-04-25 NOTE — Progress Notes (Signed)
NAME:  Brian Atkinson, MRN:  [id number], DOB:  May 08, 1951, LOS: 9 ADMISSION DATE:  04/15/2021 CONSULTATION DATE:  04/23/2021 REFERRING MD:  [name] - CCS CHIEF COMPLAINT:  L pleural effusion s/p multiple abdominal surgeries and splenic artery embolization   History of Present Illness:  70 year old never smoker with PMHx significant for osteomyelitis of the patella, MRSA infection and incisional hernia (s/p initial repair 6/20) with complicated postoperative course requiring exploratory laparotomy and small bowel resection with mesh reconstruction. Readmitted 7/8 with ruptured spleen prompting coil embolization of splenic artery by IR 7/9.  PCCM consulted for management of left lower lobe atelectasis and pleural effusion noted on CT abdomen 7/16.  Pertinent Medical History:   Past Medical History:  Diagnosis Date   Acute osteomyelitis of patella (HCC)    Complication of anesthesia    post-op memory problems no versed   History of MRSA infection    Medical history non-contributory    Patella fracture    left    Significant Hospital Events: Including procedures, antibiotic start and stop dates in addition to other pertinent events   6/20 Incisional hernia repair 6/27 Laparotomy, small bowel resection and mesh reconstruction 7/9 IR Splenic artery embolization 7/16 PCCM consulted for LLL atelectasis and L pleural effusion 7/17 Thoracentesis completed for L pleural effusion  Interim History / Subjective:  Increased abdominal pain this AM, surgery called Appears uncomfortable Denies fever/chills, CP, n/v Endorses one small episode of loose stool yesterday Belly is significantly distended Surgery en route to assess  Objective:  Blood pressure (!) 154/82, pulse (!) 110, temperature 98 F (36.7 C), temperature source Oral, resp. rate 16, height 5' 11.5" (1.816 m), weight 109.4 kg, SpO2 96 %.        Intake/Output Summary (Last 24 hours) at 04/25/2021 1216 Last data filed at  04/25/2021 0729 Gross per 24 hour  Intake 2567.35 ml  Output 1550 ml  Net 1017.35 ml   Filed Weights   04/16/21 0650 04/24/21 0427  Weight: 103.9 kg 109.4 kg   Physical Examination: General: Acutely ill-appearing elderly man in NAD. HEENT: [place]/AT, anicteric sclera, PERRL, moist mucous membranes. Neuro: Awake, oriented x 4. Responds to verbal stimuli. Following commands consistently. Moves all 4 extremities spontaneously.  CV: RRR, no m/g/r. PULM: Breathing even and minimally labored on RA (splinting due to abdominal pain). Lung fields CTAB in upper fields, diminished at bases, L > R. GI: Soft, moderately distended but compressible. Tympanic on percussion in upper quadrants. Slightly hypoactive bowel sounds. Midline incision well-healing with minimal scabbing. Extremities: Trace bilateral symmetric LE edema noted. Skin: Warm/dry, no rashes.  Resolved Hospital Problem List:    Assessment & Plan:  This 70 year old gentleman is seen in consultation at the request of the surgery team for recommendations on further evaluation and management of left pleural effusion.  Left pleural effusion with left lower lobe atelectasis Differential diagnosis of left pleural effusion includes reactive effusion versus hemothorax related to perisplenic, less likely infection - Bedside US by PCCM did not reveal a safe window for bedside thora - Underwent IR thoracentesis 7/17 - Pleural fluid studies consistent with exudative effusion - F/u finalized pleural fluid culture and cytology - Empiric Cefepime while awaiting Cx  Large subcapsular splenic hematoma and perisplenic hemorrhage, s/p IR splenic artery embolization Last CT A/P 7/16 demonstrating interval coil embolization of the mid to distal splenic artery with development of a probably infarct within the midbody of the spleen, stable large subcapsular splenic hematoma and moderate perisplenic hemorrhage. - Management  per surgery - Trend CBC -  Additional abdominal imaging per surgery  Abdominal pain and distention New onset of R-sided abdominal pain and moderate distention.  - Abdominal imaging per surgery - Concern for constipation, given minimal Bms - Suppository vs. enema today  Persistent leukocytosis - Trend CBC - Empiric antibiotics as above  Best Practice: (right click and "Reselect all SmartList Selections" daily)   Diet/type: clear liquids and TPN DVT prophylaxis: LMWH GI prophylaxis: PPI Lines: Central line - PICC Foley:  N/A Code Status: full code Last date of multidisciplinary goals of care discussion [Per Primary Team]  Critical care time: N/A   Faythe Ghee [facility] 04/25/21 12:16 PM  Please see [email or web address] for pager details.  From 7A-7P if no response, please call [phone] After hours, please call ELink [phone]

## 2021-04-25 NOTE — Progress Notes (Signed)
Patient ID: Brian Atkinson, male   DOB: August 02, 1951, 70 y.o.   MRN: [id number]  PROGRESS NOTE    Brian Atkinson  OEH:[id number] DOB: May 20, 1951 DOA: 04/15/2021 PCP: Shelly Rubenstein, MD   Brief Narrative:  70 year old male with history of recurrent incisional hernia which was repaired a month ago with intraperitoneal mesh with subsequent need for laparotomy and small bowel resection and mesh reconstruction followed by postop ileus and pelvic fluid collection requiring CT-guided drainage and subsequent discharge was readmitted on 04/15/2021 under general surgery's service with ruptured spleen requiring Cholybar ligation of splenic artery on 04/16/2021.  GI service was consulted on 04/23/2021 for shortness of breath and pleuritic chest pain with chest x-ray suggestive of possible pneumonia.  He was started on IV antibiotics.  CT chest/abdomen/pelvis showed moderate left pleural effusion with collapse of left lower lobe along with groundglass infiltrate within the right apex.  Assessment & Plan:   Moderate left pleural effusion with left lower lobe collapse Concern for right upper lobe pneumonia -CT chest/abdomen/pelvis showed moderate left pleural effusion with collapse of left lower lobe along with groundglass infiltrate within the right apex. -Started on cefepime and vancomycin on 04/23/2021.  DC'd vancomycin on 04/24/2021.  Continue cefepime. -Pulmonary following. S/P US guided thoracentesis and removal of 400cc fluid on 04/24/2021 by IR.  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  Currently on TPN  Leukocytosis -Still has significant leukocytosis -Will monitor  Anemia of chronic disease --hemoglobin 7.4 today.    Thrombocytosis -Possibly reactive.  Monitor  Hyponatremia -Mild -Monitor.  Subjective: Patient seen and examined at bedside.  Still complains of dyspnea with exertion. No overnight fever or vomiting  reported. Complains of severe abdominal pain.  Objective: Vitals:   04/24/21 2321 04/25/21 0300 04/25/21 0319 04/25/21 0621  BP: (!) 141/97  140/76 (!) 154/82  Pulse: 96 (!) 110 (!) 118 (!) 110  Resp: 16 11 14 16   Temp: 98.1 F (36.7 C) 98.7 F (37.1 C)    TempSrc: Oral Oral    SpO2: 96% 93% 92% 96%  Weight:      Height:        Intake/Output Summary (Last 24 hours) at 04/25/2021 0719 Last data filed at 04/25/2021 0500 Gross per 24 hour  Intake 2567.35 ml  Output 1250 ml  Net 1317.35 ml    Filed Weights   04/16/21 0650 04/24/21 0427  Weight: 103.9 kg 109.4 kg    Examination:  General exam: No distress. On room air currently. Respiratory system: Decreased breath sounds at bases bilaterally. Cardiovascular system: Tachycardic, S1 S2 heard gastrointestinal system: Abdomen is very distended, tender upper quadrant. Sluggish bowel sounds Extremities: Mild lower extremity edema present; no cyanosis    Data Reviewed: I have personally reviewed following labs and imaging studies  CBC: Recent Labs  Lab 04/20/21 0242 04/21/21 0233 04/23/21 0500 04/24/21 0434 04/25/21 0340  WBC 19.8* 18.0* 16.2* 18.3* 18.0*  NEUTROABS 15.8*  --   --   --  13.3*  HGB 7.3* 7.1* 7.1* 7.3* 7.4*  HCT 23.6* 23.0* 23.5* 23.8* 24.7*  MCV 90.8 89.1 90.0 88.8 89.5  PLT 512* 464* 472* 449* 489*    Basic Metabolic Panel: Recent Labs  Lab 04/21/21 0233 04/22/21 0405 04/23/21 0500 04/24/21 0434 04/25/21 0340  NA 134* 135 134* 132* 133*  K 4.0 4.1 4.2 4.3 4.3  CL 99 99 101 99 100  CO2 28 27 27 26 24   GLUCOSE 126*  125* 127* 112* 136*  BUN 10 10 12 12 15   CREATININE 0.67 0.46* 0.57* 0.66 0.57*  CALCIUM 8.2* 8.6* 8.2* 8.2* 8.2*  MG 2.0 2.0 1.9 2.0 1.9  PHOS 3.5 3.4 3.8 3.5 3.5    GFR: Estimated Creatinine Clearance: 110.6 mL/min (A) (by C-G formula based on SCr of 0.57 mg/dL (L)). Liver Function Tests: Recent Labs  Lab 04/21/21 0233 04/22/21 0405 04/23/21 0500 04/24/21 0434  04/25/21 0340  AST 16 17 28  39 40  ALT 13 12 19 29  32  ALKPHOS 71 67 82 98 113  BILITOT 0.7 0.6 0.6 0.6 0.7  PROT 6.5 6.7 6.5 6.7 6.8  ALBUMIN 2.4* 2.4* 2.4* 2.4* 2.4*    No results for input(s): LIPASE, AMYLASE in the last 168 hours. No results for input(s): AMMONIA in the last 168 hours. Coagulation Profile: No results for input(s): INR, PROTIME in the last 168 hours.  Cardiac Enzymes: No results for input(s): CKTOTAL, CKMB, CKMBINDEX, TROPONINI in the last 168 hours. BNP (last 3 results) No results for input(s): PROBNP in the last 8760 hours. HbA1C: No results for input(s): HGBA1C in the last 72 hours. CBG: Recent Labs  Lab 04/24/21 1135 04/24/21 1601 04/24/21 1943 04/24/21 2320 04/25/21 0316  GLUCAP 145* 118* 142* 113* 140*    Lipid Profile: Recent Labs    04/25/21 0340  TRIG 64   Thyroid Function Tests: No results for input(s): TSH, T4TOTAL, FREET4, T3FREE, THYROIDAB in the last 72 hours. Anemia Panel: No results for input(s): VITAMINB12, FOLATE, FERRITIN, TIBC, IRON, RETICCTPCT in the last 72 hours. Sepsis Labs: No results for input(s): PROCALCITON, LATICACIDVEN in the last 168 hours.  Recent Results (from the past 240 hour(s))  Blood culture (routine x 2)     Status: None   Collection Time: 04/15/21 11:44 PM   Specimen: BLOOD  Result Value Ref Range Status   Specimen Description   Final    BLOOD LEFT ANTECUBITAL Performed at [facility], [address] [place], [place] [address]    Special Requests   Final    BOTTLES DRAWN AEROBIC AND ANAEROBIC Blood Culture adequate volume Performed at [facility], [address] [place], [place] [address]    Culture   Final    NO GROWTH 5 DAYS Performed at [facility], [address] [place], [place] [address]    Report Status 04/21/2021 FINAL  Final  Blood culture (routine x 2)     Status: None   Collection Time: 04/15/21 11:49 PM   Specimen: BLOOD  Result  Value Ref Range Status   Specimen Description   Final    BLOOD RIGHT ANTECUBITAL Performed at [facility], [address] [place], [place] [address]    Special Requests   Final    BOTTLES DRAWN AEROBIC AND ANAEROBIC Blood Culture adequate volume Performed at [facility], [address] [place], [place] [address]    Culture   Final    NO GROWTH 5 DAYS Performed at [facility], [address] [place], [place] [address]    Report Status 04/21/2021 FINAL  Final  Resp Panel by RT-PCR (Flu A&B, Covid) Nasopharyngeal Swab     Status: None   Collection Time: 04/16/21  1:29 AM   Specimen: Nasopharyngeal Swab; Nasopharyngeal(NP) swabs in vial transport medium  Result Value Ref Range Status   SARS Coronavirus 2 by RT PCR NEGATIVE NEGATIVE Final    Comment: (NOTE) SARS-CoV-2 target nucleic  acids are NOT DETECTED.  The SARS-CoV-2 RNA is generally detectable in upper respiratory specimens during the acute phase of infection. The lowest concentration of SARS-CoV-2 viral copies this assay can detect is 138 copies/mL. A negative result does not preclude SARS-Cov-2 infection and should not be used as the sole basis for treatment or other patient management decisions. A negative result may occur with  improper specimen collection/handling, submission of specimen other than nasopharyngeal swab, presence of viral mutation(s) within the areas targeted by this assay, and inadequate number of viral copies(<138 copies/mL). A negative result must be combined with clinical observations, patient history, and epidemiological information. The expected result is Negative.  Fact Sheet for Patients:  BloggerCourse.com  Fact Sheet for Healthcare Providers:  SeriousBroker.it  This test is no t yet approved or cleared by the [place] FDA and  has been authorized for detection and/or diagnosis of SARS-CoV-2  by FDA under an Emergency Use Authorization (EUA). This EUA will remain  in effect (meaning this test can be used) for the duration of the COVID-19 declaration under Section 564(b)(1) of the Act, 21 U.S.C.section 360bbb-3(b)(1), unless the authorization is terminated  or revoked sooner.       Influenza A by PCR NEGATIVE NEGATIVE Final   Influenza B by PCR NEGATIVE NEGATIVE Final    Comment: (NOTE) The Xpert Xpress SARS-CoV-2/FLU/RSV plus assay is intended as an aid in the diagnosis of influenza from Nasopharyngeal swab specimens and should not be used as a sole basis for treatment. Nasal washings and aspirates are unacceptable for Xpert Xpress SARS-CoV-2/FLU/RSV testing.  Fact Sheet for Patients: BloggerCourse.com  Fact Sheet for Healthcare Providers: SeriousBroker.it  This test is not yet approved or cleared by the [place] FDA and has been authorized for detection and/or diagnosis of SARS-CoV-2 by FDA under an Emergency Use Authorization (EUA). This EUA will remain in effect (meaning this test can be used) for the duration of the COVID-19 declaration under Section 564(b)(1) of the Act, 21 U.S.C. section 360bbb-3(b)(1), unless the authorization is terminated or revoked.  Performed at [facility], [address], [place], [place] [address]   MRSA Next Gen by PCR, Nasal     Status: None   Collection Time: 04/16/21  2:37 AM   Specimen: Nasal Mucosa; Nasal Swab  Result Value Ref Range Status   MRSA by PCR Next Gen NOT DETECTED NOT DETECTED Final    Comment: (NOTE) The GeneXpert MRSA Assay (FDA approved for NASAL specimens only), is one component of a comprehensive MRSA colonization surveillance program. It is not intended to diagnose MRSA infection nor to guide or monitor treatment for MRSA infections. Test performance is not FDA approved in patients less than 32 years old. Performed at [facility], [address], [place], [place] [address]   Body fluid culture w Gram Stain     Status: None (Preliminary result)   Collection Time: 04/24/21 10:33 AM   Specimen: PATH Cytology Pleural fluid  Result Value Ref Range Status   Specimen Description   Final    PLEURAL Performed at [facility], [address], [place], [place] [address]    Special Requests   Final    LEFT Performed at [facility], [address] [place], [place] [address]    Gram Stain   Final    FEW WBC PRESENT, PREDOMINANTLY PMN NO ORGANISMS SEEN Performed at [facility], [address], [place], [place] [address]  Culture PENDING  Incomplete   Report Status PENDING  Incomplete          Radiology Studies: DG Chest 1 View  Result Date: 04/24/2021 CLINICAL DATA:  LEFT thoracentesis EXAM: CHEST  1 VIEW COMPARISON:  Chest CT 04/23/2021 FINDINGS: Post LEFT thoracentesis. No pneumothorax appreciated. Decrease in LEFT effusion. Small persistent LEFT effusion. RIGHT PICC line place.  RIGHT lung clear IMPRESSION: Post LEFT thoracentesis without pneumothorax. Electronically Signed   By: Genevive Bi M.D.   On: 04/24/2021 10:54   CT CHEST ABDOMEN PELVIS W CONTRAST  Result Date: 04/23/2021 CLINICAL DATA:  Chest wall pain, acute nonlocalized abdominal pain EXAM: CT CHEST, ABDOMEN, AND PELVIS WITH CONTRAST TECHNIQUE: Multidetector CT imaging of the chest, abdomen and pelvis was performed following the standard protocol during bolus administration of intravenous contrast. CONTRAST:  5mL OMNIPAQUE IOHEXOL 350 MG/ML SOLN COMPARISON:  CT abdomen pelvis 04/16/2021, CT chest 07/23/2020 FINDINGS: CT CHEST FINDINGS Cardiovascular: No significant coronary artery calcification. Global cardiac size within normal limits. No pericardial effusion. Central pulmonary arteries are of normal caliber. Mild atherosclerotic calcifications seen within the thoracic aorta. No  aortic aneurysm. Right upper extremity PICC line tip is seen at the superior cavoatrial junction. Mediastinum/Nodes: Esophagus unremarkable. Thyroid unremarkable. No pathologic thoracic adenopathy. Lungs/Pleura: Interval development of a moderate left pleural effusion with complete collapse of the left lower lobe. Minimal ground-glass infiltrate within the right upper lobe at the apex is nonspecific, but may be infectious or inflammatory in nature. Mild right basilar atelectasis. The lungs are otherwise clear. No pneumothorax. No central obstructing lesion. Musculoskeletal: No acute bone abnormality. No lytic or blastic bone lesion. CT ABDOMEN PELVIS FINDINGS Hepatobiliary: Scattered cysts and again identified within the liver. No enhancing intrahepatic mass identified. No intra or extrahepatic biliary ductal dilation. Layering high attenuation material is again seen within the gallbladder lumen possibly representing gallbladder sludge or vicarious excretion of contrast. Pancreas: Unremarkable Spleen: A a wedge like perfusion defect has developed within the mid body of the spleen likely representing a a focal infarct related to recent splenic artery coil embolization. Multiple embolization coils are seen adjacent to the distal tail of the pancreas within the mid to distal splenic artery. Large mixed attenuation subcapsular splenic hematoma is again identified and is stable in size since prior examination measuring roughly 15.7 cm in greatest dimension. Small perisplenic hemorrhage is again identified and appears stable. Hemoperitoneum has decreased in the interval since prior examination. Adrenals/Urinary Tract: The adrenal glands are unremarkable. The kidneys are normal in size and position. Tiny probable cortical cyst within the interpolar region of the left kidney. The kidneys are otherwise unremarkable. Bladder unremarkable. Stomach/Bowel: The stomach is mildly displaced from the a left upper quadrant by the  large splenic hematoma, similar to that noted on prior examination a small amount of hemorrhage seen along the greater curvature of the stomach, unchanged. The stomach, small bowel, and large bowel are otherwise unremarkable. Appendectomy has been performed. No free intraperitoneal gas. Vascular/Lymphatic: Mild aortoiliac atherosclerotic calcification. No aortic aneurysm. Interval coil embolization of the a mid to distal splenic artery. Reproductive: Prostate is unremarkable. Other: Small fluid within the deep abdominal fascia of the anterior abdominal wall subjacent to the umbilicus in keeping with recent ventral hernia repair. No recurrent abdominal wall hernia. Small right inguinal fat containing hernia noted. Mild subcutaneous edema has developed within the flanks bilaterally in keeping with mild anasarca. Musculoskeletal: No acute bone abnormality within the abdomen and pelvis. Osseous structures are age-appropriate. IMPRESSION: Interval coil  embolization of the mid to distal splenic artery with development of a probable infarct within the mid body of the spleen. Stable large subcapsular splenic hematoma and moderate perisplenic hemorrhage. Decreasing hemoperitoneum. Interval development of a moderate left pleural effusion with complete collapse of the left lower lobe. Minimal ground-glass infiltrate within the right apex possibly infectious or inflammatory in nature. Surgical changes in keeping with ventral hernia repair, unchanged. Aortic Atherosclerosis (ICD10-I70.0). Electronically Signed   By: Helyn Numbers MD   On: 04/23/2021 23:18   DG CHEST PORT 1 VIEW  Result Date: 04/23/2021 CLINICAL DATA:  Sudden onset shortness of breath and left chest pain when inhaling this morning. EXAM: PORTABLE CHEST 1 VIEW COMPARISON:  04/06/2021 FINDINGS: Stable normal sized heart. Right PICC tip in the inferior aspect of the superior vena cava, approximately 1.5 cm proximal to the superior cavoatrial junction. Poor  inspiration with improvement. Increased left basilar airspace opacity including dense left lower lobe opacity. Interval minimal patchy opacity at the right lung base. Moderate peribronchial thickening. Mild thoracolumbar spine degenerative changes. IMPRESSION: 1. Increased airspace opacity at the left lung base including dense left lower lobe atelectasis or pneumonia. 2. Interval small amount of right basilar patchy atelectasis or pneumonia. 3. Moderate bronchitic changes. Electronically Signed   By: Beckie Salts M.D.   On: 04/23/2021 12:54   US THORACENTESIS ASP PLEURAL SPACE W/IMG GUIDE  Result Date: 04/24/2021 INDICATION: Patient with history of recent incisional hernia repair requiring laparotomy and small-bowel resection, ruptured spleen on 04/15/21 with coil embolization of splenic artery on 04/16/21, left pleural effusion. Request received for diagnostic and therapeutic left thoracentesis. EXAM: ULTRASOUND GUIDED DIAGNOSTIC AND THERAPEUTIC LEFT  THORACENTESIS MEDICATIONS: 1% lidocaine to skin and subcutaneous tissue COMPLICATIONS: None immediate. PROCEDURE: An ultrasound guided thoracentesis was thoroughly discussed with the patient and questions answered. The benefits, risks, alternatives and complications were also discussed. The patient understands and wishes to proceed with the procedure. Written consent was obtained. Ultrasound was performed to localize and mark an adequate pocket of fluid in the left chest. The area was then prepped and draped in the normal sterile fashion. 1% Lidocaine was used for local anesthesia. Under ultrasound guidance a 6 Fr Safe-T-Centesis catheter was introduced. Thoracentesis was performed. The catheter was removed and a dressing applied. FINDINGS: A total of approximately 400 cc of dark reddish yellow fluid was removed. Samples were sent to the laboratory as requested by the clinical team. IMPRESSION: Successful ultrasound guided diagnostic and therapeutic left  thoracentesis yielding 400 cc of pleural fluid. Read by: Jeananne Rama, PA-C Electronically Signed   By: Corlis Leak M.D.   On: 04/24/2021 10:33        Scheduled Meds:  (feeding supplement) PROSource Plus  30 mL Oral Daily   acetaminophen  1,000 mg Oral Q6H   Chlorhexidine Gluconate Cloth  6 each Topical Q0600   enoxaparin (LOVENOX) injection  40 mg Subcutaneous Q24H   feeding supplement  1 Container Oral Q24H   gabapentin  300 mg Oral TID   insulin aspart  0-9 Units Subcutaneous Q4H   lip balm  1 application Topical BID   mouth rinse  15 mL Mouth Rinse BID   pantoprazole  40 mg Oral BID   polycarbophil  625 mg Oral BID   polyethylene glycol  17 g Oral Daily   Continuous Infusions:  ceFEPime (MAXIPIME) IV Stopped (04/25/21 0206)   ondansetron (ZOFRAN) IV     TPN ADULT (ION) 100 mL/hr at 04/25/21 0500  Glade Lloyd, MD [facility] 04/25/2021, 7:19 AM

## 2021-04-25 NOTE — Progress Notes (Signed)
eLink Physician-Brief Progress Note Patient Name: Brian Atkinson DOB: Dec 18, 1950 MRN: [id number]   Date of Service  04/25/2021  HPI/Events of Note  Agitation -  Patient pulling at lines and NGT. Nursing request for bilateral soft wrist restraints.   eICU Interventions  Plan: Bilateral soft wrist restraints X 10 hours.      Intervention Category Major Interventions: Delirium, psychosis, severe agitation - evaluation and management  Edsel Shives Eugene 04/25/2021, 11:38 PM

## 2021-04-25 NOTE — Anesthesia Procedure Notes (Signed)
Arterial Line Insertion Start/End7/18/2022 7:00 PM, 04/25/2021 7:20 PM Performed by: Eilene Ghazi, MD, anesthesiologist  Patient location: Pre-op. Preanesthetic checklist: patient identified, IV checked, site marked, risks and benefits discussed, surgical consent, monitors and equipment checked, pre-op evaluation, timeout performed and anesthesia consent Lidocaine 1% used for infiltration radial was placed Catheter size: 20 Fr Hand hygiene performed  and maximum sterile barriers used   Attempts: 2 Procedure performed without using ultrasound guided technique. Following insertion, dressing applied. Post procedure assessment: normal and unchanged  Patient tolerated the procedure well with no immediate complications.

## 2021-04-25 NOTE — Progress Notes (Signed)
Chaplain engaged in an initial visit with Brian Atkinson" and his wife.  Chaplain provided Mrs. Winkles with clothing.  She had been wearing a family members Librarian, academic and needed new clothing.  Chaplain told her to let her know if that clothing did not fit.    Mrs. Charette noted that Brian Atkinson was not doing too well and that he would be getting a CT scan later on.  Chaplain offered support and prayer with Brian Atkinson and Mrs. Swire.  Chaplain will continue to follow-up.    04/25/21 1100  Clinical Encounter Type  Visited With Patient and family together  Visit Type Initial;Spiritual support;Social support  Spiritual Encounters  Spiritual Needs Prayer

## 2021-04-25 NOTE — Anesthesia Procedure Notes (Signed)
Procedure Name: Intubation Date/Time: 04/25/2021 7:21 PM Performed by: Gerald Leitz, CRNA Pre-anesthesia Checklist: Patient identified, Patient being monitored, Timeout performed, Emergency Drugs available and Suction available Patient Re-evaluated:Patient Re-evaluated prior to induction Oxygen Delivery Method: Circle system utilized Preoxygenation: Pre-oxygenation with 100% oxygen Induction Type: IV induction and Rapid sequence Ventilation: Mask ventilation without difficulty Laryngoscope Size: Mac and 3 Grade View: Grade I Tube type: Oral Tube size: 8.0 mm Number of attempts: 1 Airway Equipment and Method: Stylet and Video-laryngoscopy Placement Confirmation: ETT inserted through vocal cords under direct vision, positive ETCO2 and breath sounds checked- equal and bilateral Secured at: 23 cm Tube secured with: Tape Dental Injury: Teeth and Oropharynx as per pre-operative assessment

## 2021-04-26 ENCOUNTER — Encounter (HOSPITAL_COMMUNITY): Payer: Self-pay | Admitting: Surgery

## 2021-04-26 DIAGNOSIS — D735 Infarction of spleen: Principal | ICD-10-CM

## 2021-04-26 LAB — GLUCOSE, CAPILLARY
Glucose-Capillary: 147 mg/dL — ABNORMAL HIGH (ref 70–99)
Glucose-Capillary: 150 mg/dL — ABNORMAL HIGH (ref 70–99)
Glucose-Capillary: 159 mg/dL — ABNORMAL HIGH (ref 70–99)
Glucose-Capillary: 130 mg/dL — ABNORMAL HIGH (ref 70–99)

## 2021-04-26 LAB — CBC
HCT: 29.4 % — ABNORMAL LOW (ref 39.0–52.0)
Hemoglobin: 8.9 g/dL — ABNORMAL LOW (ref 13.0–17.0)
MCH: 27.2 pg (ref 26.0–34.0)
MCHC: 30.3 g/dL (ref 30.0–36.0)
MCV: 89.9 fL (ref 80.0–100.0)
Platelets: 431 10*3/uL — ABNORMAL HIGH (ref 150–400)
RBC: 3.27 MIL/uL — ABNORMAL LOW (ref 4.22–5.81)
RDW: 16.2 % — ABNORMAL HIGH (ref 11.5–15.5)
WBC: 20.9 10*3/uL — ABNORMAL HIGH (ref 4.0–10.5)
nRBC: 1.7 % — ABNORMAL HIGH (ref 0.0–0.2)

## 2021-04-26 LAB — BASIC METABOLIC PANEL
Anion gap: 6 (ref 5–15)
BUN: 13 mg/dL (ref 8–23)
CO2: 27 mmol/L (ref 22–32)
Calcium: 8 mg/dL — ABNORMAL LOW (ref 8.9–10.3)
Chloride: 103 mmol/L (ref 98–111)
Creatinine, Ser: 0.65 mg/dL (ref 0.61–1.24)
GFR, Estimated: >60 mL/min (ref >60)
Glucose, Bld: 152 mg/dL — ABNORMAL HIGH (ref 70–99)
Potassium: 4.7 mmol/L (ref 3.5–5.1)
Sodium: 136 mmol/L (ref 135–145)

## 2021-04-26 LAB — MAGNESIUM: Magnesium: 1.8 mg/dL (ref 1.7–2.4)

## 2021-04-26 LAB — PHOSPHORUS: Phosphorus: 2.8 mg/dL (ref 2.5–4.6)

## 2021-04-26 MED ORDER — HYDRALAZINE HCL 20 MG/ML IJ SOLN
10.0000 mg | INTRAMUSCULAR | Status: DC | PRN
  Administered 2021-04-26: 10 mg via INTRAVENOUS
  Filled 2021-04-26: qty 1

## 2021-04-26 MED ORDER — ACETAMINOPHEN 650 MG RE SUPP
650.0000 mg | Freq: Four times a day (QID) | RECTAL | Status: DC | PRN
  Administered 2021-04-26: 650 mg via RECTAL
  Filled 2021-04-26: qty 1

## 2021-04-26 MED ORDER — MAGNESIUM SULFATE IN D5W 1-5 GM/100ML-% IV SOLN
1.0000 g | Freq: Once | INTRAVENOUS | Status: AC
  Administered 2021-04-26: 1 g via INTRAVENOUS
  Filled 2021-04-26: qty 100

## 2021-04-26 MED ORDER — DIPHENHYDRAMINE HCL 12.5 MG/5ML PO ELIX: 12.5000 mg | ORAL_SOLUTION | Freq: Four times a day (QID) | ORAL | Status: DC | PRN

## 2021-04-26 MED ORDER — HYDROMORPHONE HCL 1 MG/ML IJ SOLN
0.5000 mg | INTRAMUSCULAR | Status: DC | PRN
  Administered 2021-04-26 – 2021-04-28 (×8): 2 mg via INTRAVENOUS
  Administered 2021-04-28: 1 mg via INTRAVENOUS
  Administered 2021-04-29: 2 mg via INTRAVENOUS
  Filled 2021-04-26 (×7): qty 2
  Filled 2021-04-26: qty 1
  Filled 2021-04-26: qty 2
  Filled 2021-04-26: qty 1
  Filled 2021-04-26 (×3): qty 2

## 2021-04-26 MED ORDER — NALOXONE HCL 0.4 MG/ML IJ SOLN: 0.4000 mg | INTRAMUSCULAR | Status: DC | PRN

## 2021-04-26 MED ORDER — OXYCODONE HCL 5 MG PO TABS
5.0000 mg | ORAL_TABLET | ORAL | Status: DC | PRN
  Administered 2021-04-27 (×2): 5 mg via ORAL
  Filled 2021-04-26 (×5): qty 1

## 2021-04-26 MED ORDER — DIPHENHYDRAMINE HCL 50 MG/ML IJ SOLN: 12.5000 mg | Freq: Four times a day (QID) | INTRAMUSCULAR | Status: DC | PRN

## 2021-04-26 MED ORDER — HYDROMORPHONE 1 MG/ML IV SOLN
INTRAVENOUS | Status: DC
Start: 2021-04-26 — End: 2021-04-26
  Administered 2021-04-26: 30 mg via INTRAVENOUS
  Filled 2021-04-26: qty 30

## 2021-04-26 MED ORDER — OXYCODONE HCL 5 MG PO TABS
10.0000 mg | ORAL_TABLET | ORAL | Status: DC | PRN
  Administered 2021-04-26 – 2021-04-29 (×6): 10 mg via ORAL
  Filled 2021-04-26 (×6): qty 2

## 2021-04-26 MED ORDER — METOPROLOL TARTRATE 5 MG/5ML IV SOLN
2.5000 mg | INTRAVENOUS | Status: DC | PRN
  Administered 2021-04-26: 2.5 mg via INTRAVENOUS
  Filled 2021-04-26: qty 5

## 2021-04-26 MED ORDER — ONDANSETRON HCL 4 MG/2ML IJ SOLN: 4.0000 mg | Freq: Four times a day (QID) | INTRAMUSCULAR | Status: DC | PRN

## 2021-04-26 MED ORDER — SODIUM CHLORIDE 0.9% FLUSH: 9.0000 mL | INTRAVENOUS | Status: DC | PRN

## 2021-04-26 MED ORDER — POTASSIUM PHOSPHATES 150 MMOLE/50ML IV SOLN
INTRAVENOUS | Status: AC
Start: 2021-04-26 — End: 2021-04-27
  Filled 2021-04-26: qty 1200

## 2021-04-26 NOTE — Progress Notes (Signed)
eLink Physician-Brief Progress Note Patient Name: Brian Atkinson DOB: 1951-07-14 MRN: [id number]   Date of Service  04/26/2021  HPI/Events of Note  Hypertension - SBP = 190-200 by A-line and 176 by cuff.   eICU Interventions  Plan: Hydralazine 10 mg IV Q 4 hours PRN SBP > 170 or DBP > 100.      Intervention Category Major Interventions: Hypertension - evaluation and management  Fernand Sorbello Eugene 04/26/2021, 2:03 AM

## 2021-04-26 NOTE — Progress Notes (Addendum)
PHARMACY - TOTAL PARENTERAL NUTRITION CONSULT NOTE   Indication: Prolonged ileus  Patient Measurements: Height: 5' 11.5" (181.6 cm) Weight: 109.4 kg (241 lb 2.9 oz) IBW/kg (Calculated) : 76.45   Body mass index is 33.17 kg/m.  Recent Labs    04/24/21 0434 04/25/21 0340 04/25/21 2030 04/26/21 0247  NA 132* 133* 134* 136  K 4.3 4.3 4.5 4.7  CL 99 100  --  103  CO2 26 24  --  27  GLUCOSE 112* 136*  --  152*  BUN 12 15  --  13  CREATININE 0.66 0.57*  --  0.65  CALCIUM 8.2* 8.2*  --  8.0*  PHOS 3.5 3.5  --  2.8  MG 2.0 1.9  --  1.8  ALBUMIN 2.4* 2.4*  --   --   ALKPHOS 98 113  --   --   AST 39 40  --   --   ALT 29 32  --   --   BILITOT 0.6 0.7  --   --   TRIG  --  64  --   --   PREALBUMIN  --  7.3*  --   --      Assessment:  70 yo male s/p ventral hernia repair with mesh on 6/20. 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 He had a postoperative ileus for which he was on TPN. He also developed a pelvic abscess which was drained by IR on 7/6. His surgical retrorectus drain was removed on 7/7. Prior to discharge he was tolerating a diet and TPN was stopped. He was discharged home 7/8.  He was readmitted 7/9 with hypotension, AKI and leukocytosis.  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  On 04/16/21, he underwent coil embolization of the splenic artery.  He has not been tolerating diet, so Pharmacy has been consulted to start TPN.  Glucose / Insulin: No hx DM - CBGs 128-150  - sensitive SSI:  3 units / 24h Electrolytes: WNL except ionized Ca 1.13 (7/18).  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  Central access: PICC 7/13 TPN start date: 7/13  Nutritional Goals (updated RD recommendation on 7/13): kCal: 2250-2350, Protein: 115-130, Fluid: 2.4L/day Goal TPN rate is 100 mL/hr (provides 120 g of protein and 2347 kcals per day)  Current Nutrition:  NPO, MD plans to start FLD 7/19 Patient pulled out NG tube and refuses to have it replaced  Plan:  Now:  Magnesium 1g IV At 18:00  Continue TPN at goal rate of 100 mL/hr Electrolytes in TPN:  Na to 117mEq/L  K 35 mEq/L Ca 10 mEq/L  Mg 10 mEq/L  Phos 59mmol/L  Cl:Ac 1:1 Add standard MVI and  trace elements to TPN Continue Sensitive SSI decreased to q8h and adjust as needed  IVF per MD - none currently Monitor TPN labs on Mon/Thurs   Lynann Beaver PharmD, BCPS Clinical Pharmacist [facility] main pharmacy [phone] 04/26/2021 9:03 AM

## 2021-04-26 NOTE — Progress Notes (Signed)
Patient came back from surgery with uncontrolled pain esp when awake. Non-violent restrained was initiated at 23:00 but removed at 1:00 when patient was more alert and cooperative. Gave Dilaudid 2mg  IV 3x to control his pain. His HR=145 and MD ordered Metoprolol IV and was given, HR=120's. At Patient also had 101F fever at 4:20, gave Tylenol suppository and improve to 100.

## 2021-04-26 NOTE — Progress Notes (Signed)
eLink Physician-Brief Progress Note Patient Name: Josyah Achor DOB: December 31, 1950 MRN: [id number]   Date of Service  04/26/2021  HPI/Events of Note  Sinus Tachycardia - HR = 132 and BP = 140/65.  eICU Interventions  Plan: Metoprolol 2.5 mg IV Q 3 hours PRN HR > 115.     Intervention Category Major Interventions: Arrhythmia - evaluation and management  Adamariz Gillott Eugene 04/26/2021, 3:46 AM

## 2021-04-26 NOTE — Progress Notes (Addendum)
Patient pulled out NG tube and refuses to have it replaced. Notified surgeon and elink.   Dr Cliffton Asters (surgeon on call) called back at [phone]. Advised that he will have day surgeon order PCA for unmanaged pain. Patient updated by unit director.

## 2021-04-26 NOTE — Progress Notes (Signed)
Patient ID: Brian Atkinson, male   DOB: [birth year], 70 y.o.   MRN: [id number] Patient had emergent splenectomy by general surgery on 04/25/2021 and is currently managed by primary surgical team and followed by PCCM.  TRH service will sign off at this time.  Please recall [place] if needed.

## 2021-04-26 NOTE — Progress Notes (Signed)
NAME:  Brian Atkinson, MRN:  [id number], DOB:  May 20, 1951, LOS: 10 ADMISSION DATE:  04/15/2021 CONSULTATION DATE:  04/23/2021 REFERRING MD:  [name] - CCS CHIEF COMPLAINT:  L pleural effusion s/p multiple abdominal surgeries and splenic artery embolization   History of Present Illness:  70 year old never smoker with PMHx significant for osteomyelitis of the patella, MRSA infection and incisional hernia (s/p initial repair 6/20) with complicated postoperative course requiring exploratory laparotomy and small bowel resection with mesh reconstruction. Readmitted 7/8 with ruptured spleen prompting coil embolization of splenic artery by IR 7/9. Developed fever and new abdominal pain 7/18 with persistent leukocytosis. 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  PCCM consulted for management of left lower lobe atelectasis and pleural effusion noted on CT abdomen 7/16.  Pertinent Medical History:   Past Medical History:  Diagnosis Date   Acute osteomyelitis of patella (HCC)    Complication of anesthesia    post-op memory problems no versed   History of MRSA infection    Medical history non-contributory    Patella fracture    left    Significant Hospital Events: Including procedures, antibiotic start and stop dates in addition to other pertinent events   6/20 Incisional hernia repair 6/27 Laparotomy, small bowel resection and mesh reconstruction 7/9 IR Splenic artery embolization 7/16 PCCM consulted for LLL atelectasis and L pleural effusion 7/17 Thoracentesis completed for L pleural effusion 7/18 New fever, persistent leukocytosis, increasing abdominal pain. CT with worsening splenic infarct +new gas lucency c/f abscess formation. Taken to OR for splenectomy.  Interim History / Subjective:  POD#1 from emergent splenectomy Reports pain 4/10 this morning Appears  diaphoretic and slightly anxious Tachy to 120s Very thirsty, asking for water/ice chips Self-d/c'ed NGT overnight PCA ordered for pain control Awaiting further surgery recommendations  Objective:  Blood pressure 122/71, pulse (!) 131, temperature 100 F (37.8 C), temperature source Oral, resp. rate (!) 21, height 5' 11.5" (1.816 m), weight 109.4 kg, SpO2 91 %.        Intake/Output Summary (Last 24 hours) at 04/26/2021 0755 Last data filed at 04/26/2021 0735 Gross per 24 hour  Intake 5370.03 ml  Output 1350 ml  Net 4020.03 ml    Filed Weights   04/16/21 0650 04/24/21 0427  Weight: 103.9 kg 109.4 kg   Physical Examination: General: Acutely ill-appearing elderly man in NAD. Appears diaphoretic. HEENT: [place]/AT, anicteric sclera, PERRL, dry mucous membranes. Neuro: Awake, oriented x 4. Responds to verbal stimuli. Following commands consistently. Moves all 4 extremities spontaneously.  CV: Tachycardic, regular rhythm, no m/g/r. PULM: Breathing even and unlabored on RA. Lung fields clear in upper fields, diminished at bases, L > R. GI: Soft, mildly distended. Appropriately TTP postoperatively, mainly incisional tenderness. L subcostal incision with subcuticular/Dermabond closure, no erythema or drainage. JP x 1 to RUQ with sanguineous output. Well-healed midline incision. Normoactive bowel sounds. Extremities: Bilateral symmetric trace LE edema noted. Skin: Warm/dry, no rashes. Incisions as above.  Resolved Hospital Problem List:    Assessment & Plan:  This 70 year old gentleman is seen in consultation at the request of the surgery team for recommendations on further evaluation and management of left pleural effusion.  Left pleural effusion with left lower lobe atelectasis Differential diagnosis of left pleural effusion includes reactive effusion versus hemothorax related to perisplenic, less likely infection. Bedside US by PCCM did not reveal a safe window for bedside thora. Underwent  IR thoracentesis  7/17 - Pleural studies consistent with exudative effusion - F/u finalized pleural fluid culture/cytology - Cefepime per pharmacy  Large subcapsular splenic hematoma and perisplenic hemorrhage, s/p IR splenic artery embolization S/p splenectomy Last CT A/P 7/16 demonstrating interval coil embolization of the mid to distal splenic artery with development of a probably infarct within the midbody of the spleen, stable large subcapsular splenic hematoma and moderate perisplenic hemorrhage. New fever, worsening abdominal pain and leukocytosis prompted repeat CT 7/18 showing worsening splenic infarct with gas lucency c/f abscess formation. Patient taken emergently to the OR for splenectomy. - POD#1 from splenectomy - PCA ordered for pain control - Postoperative management per surgery - Cefepime as above - F/u OR Cx/specimens, if available  Best Practice: (right click and "Reselect all SmartList Selections" daily)   Diet/type: NPO and TPN DVT prophylaxis: LMWH GI prophylaxis: PPI Lines: Central line - PICC Foley:  N/A Code Status: full code Last date of multidisciplinary goals of care discussion [Per Primary Team]  Critical care time: N/A   Faythe Ghee [facility] 04/26/21 7:55 AM  Please see [email or web address] for pager details.  From 7A-7P if no response, please call [phone] After hours, please call ELink [phone]

## 2021-04-26 NOTE — Progress Notes (Signed)
Progress Note: General Surgery Service   Chief Complaint/Subjective: Incisional pain after surgery.  Preoperative pain seems to have improved.  Patient pulled NG out this morning.  Asking for liquids.  Objective: Vital signs in last 24 hours: Temp:  [98.1 F (36.7 C)-101 F (38.3 C)] 99.4 F (37.4 C) (07/19 0800) Pulse Rate:  [78-143] 131 (07/19 0600) Resp:  [10-26] 18 (07/19 0923) BP: (122-176)/(63-150) 122/71 (07/19 0600) SpO2:  [90 %-100 %] 95 % (07/19 0923) Arterial Line BP: (103-201)/(63-87) 103/72 (07/19 0125) FiO2 (%):  [21 %] 21 % (07/19 0923) Last BM Date: 04/24/21  Intake/Output from previous day: 07/18 0701 - 07/19 0700 In: 5370 [I.V.:4184.4; Blood:630; IV Piggyback:555.6] Out: 1605 [Urine:1225; Emesis/NG output:200; Drains:80; Blood:100] Intake/Output this shift: Total I/O In: -  Out: 45 [Drains:45]  GI: Abd Distended, incisions c/d/I, midline healing well, LUQ subcostal incision with dermabond c/d/i   Lab Results: CBC  Recent Labs    04/25/21 1149 04/25/21 2030 04/26/21 0247  WBC 18.9*  --  20.9*  HGB 7.0* 8.2* 8.9*  HCT 24.4* 24.0* 29.4*  PLT 482*  --  431*    BMET Recent Labs    04/25/21 0340 04/25/21 2030 04/26/21 0247  NA 133* 134* 136  K 4.3 4.5 4.7  CL 100  --  103  CO2 24  --  27  GLUCOSE 136*  --  152*  BUN 15  --  13  CREATININE 0.57*  --  0.65  CALCIUM 8.2*  --  8.0*    PT/INR No results for input(s): LABPROT, INR in the last 72 hours.  ABG Recent Labs    04/25/21 2030  PHART 7.395  HCO3 25.2    Anti-infectives: Anti-infectives (From admission, onward)    Start     Dose/Rate Route Frequency Ordered Stop   04/24/21 1400  vancomycin (VANCOREADY) IVPB 1000 mg/200 mL  Status:  Discontinued        1,000 mg 200 mL/hr over 60 Minutes Intravenous Every 12 hours 04/24/21 0229 04/24/21 1326   04/24/21 0200  vancomycin (VANCOREADY) IVPB 2000 mg/400 mL        2,000 mg 200 mL/hr over 120 Minutes Intravenous  Once 04/24/21 0102  04/24/21 0324   04/24/21 0200  ceFEPIme (MAXIPIME) 2 g in sodium chloride 0.9 % 100 mL IVPB        2 g 200 mL/hr over 30 Minutes Intravenous Every 8 hours 04/24/21 0102     04/16/21 1800  piperacillin-tazobactam (ZOSYN) IVPB 3.375 g  Status:  Discontinued       Note to Pharmacy: Pharmacy may adjust dosing strength, schedule, rate of infusion, etc as needed to optimize therapy   3.375 g 12.5 mL/hr over 240 Minutes Intravenous Every 8 hours 04/16/21 1640 04/18/21 1014   04/16/21 1730  piperacillin-tazobactam (ZOSYN) IVPB 3.375 g  Status:  Discontinued       Note to Pharmacy: Pharmacy may adjust dosing strength, schedule, rate of infusion, etc as needed to optimize therapy   3.375 g 12.5 mL/hr over 240 Minutes Intravenous Every 8 hours 04/16/21 1640 04/16/21 1640   04/16/21 1000  cefTRIAXone (ROCEPHIN) 2 g in sodium chloride 0.9 % 100 mL IVPB  Status:  Discontinued       Note to Pharmacy: Pharmacy may adjust dosing strength / duration / interval for maximal efficacy   2 g 200 mL/hr over 30 Minutes Intravenous Every 24 hours 04/16/21 0828 04/16/21 1640   04/16/21 1000  metroNIDAZOLE (FLAGYL) IVPB 500 mg  Status:  Discontinued        500 mg 100 mL/hr over 60 Minutes Intravenous Every 6 hours 04/16/21 0828 04/16/21 1640   04/16/21 0100  piperacillin-tazobactam (ZOSYN) IVPB 3.375 g        3.375 g 12.5 mL/hr over 240 Minutes Intravenous  Once 04/16/21 0057 04/16/21 0143       Medications: Scheduled Meds:  sodium chloride   Intravenous Once   Chlorhexidine Gluconate Cloth  6 each Topical Q0600   enoxaparin (LOVENOX) injection  40 mg Subcutaneous Q24H   gabapentin  300 mg Oral TID   HYDROmorphone   Intravenous Q4H   insulin aspart  0-9 Units Subcutaneous Q8H   lip balm  1 application Topical BID   mouth rinse  15 mL Mouth Rinse BID   pantoprazole  40 mg Oral BID   polycarbophil  625 mg Oral BID   polyethylene glycol  17 g Oral Daily   sodium phosphate  1 enema Rectal Once   Continuous  Infusions:  ceFEPime (MAXIPIME) IV Stopped (04/26/21 0207)   magnesium sulfate bolus IVPB     TPN ADULT (ION) 100 mL/hr at 04/26/21 0700   PRN Meds:.acetaminophen, alum & mag hydroxide-simeth, bisacodyl, diphenhydrAMINE **OR** diphenhydrAMINE, hydrALAZINE, magic mouthwash, menthol-cetylpyridinium, metoprolol tartrate, naloxone **AND** sodium chloride flush, ondansetron (ZOFRAN) IV, ondansetron **OR** [DISCONTINUED] ondansetron (ZOFRAN) IV, phenol, prochlorperazine, simethicone  Assessment/Plan: Brian Atkinson is a 70 year old male who underwent recurrent incisional hernia repair with removal of previous intraperitoneal mesh and rives stoppa style repair.  His posterior layer broke down where the previous mesh had been removed requiring takeback to the operating room for diagnostic laparotomy converted to exploratory laparotomy, small bowel resection with vicryl mesh reconstruction of the posterior layer and phasix retromuscular mesh placement on 04/04/2021. He recovered from this in the hospital, with a prolonged postoperative ileus which was not unexpected due to the large interparietal hernia.  A CT guided drain was placed on 04/13/21, to drain a pelvic fluid collection.  He was discharged on 04/15/21.  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  On 04/16/21, he underwent coil embolization of the splenic artery.  He failed to improve symptomatically after embolization and was taken for open splenectomy on 04/25/21.    Leukocytosis likely 2/2 splenectomy, monitor for other signs of infection, ATBX started by medical team - defer to their management Try full liquid diet today Increase activity - physical therapy Incentive spirometer Monitor JP drain output PICC, TPN    LOS: 10 days   FEN: FLD, TPN ID: Per medicine team VTE: SCDs, lovenox Foley: Remove Dispo: Continued care in stepdown    Quentin Ore, MD  [facility] Use [email or web address] to contact on call provider

## 2021-04-26 NOTE — Progress Notes (Signed)
PT Cancellation Note  Patient Details Name: Brian Atkinson MRN: [id number] DOB: [birth year]   Cancelled Treatment:    Reason Eval/Treat Not Completed: Patient declined, States he is not ready, just had surgery 5 hours ago. Will check back tomorrow.   Rada Hay 04/26/2021, 1:50 PM Blanchard Kelch PT Acute Rehabilitation Services Pager [phone] Office [phone]

## 2021-04-26 NOTE — Progress Notes (Signed)
eLink Physician-Brief Progress Note Patient Name: Brian Atkinson DOB: 06-28-1951 MRN: [id number]   Date of Service  04/26/2021  HPI/Events of Note  Fever to 101.0 F - Currently on Tylenol PO. However, is NPO with NGT to LIS. AST and ALT both normal. Already on Cefepime.  eICU Interventions  Plan: D/C Tylenol PO. Tylenol suppository 650 mg PR Q 6 hours PRN Temp > 101.0 F.     Intervention Category Major Interventions: Other:  Lenell Antu 04/26/2021, 4:52 AM

## 2021-04-26 NOTE — TOC Progression Note (Signed)
Transition of Care [facility]) - Progression Note    Patient Details  Name: Brian Atkinson MRN: [id number] Date of Birth: Jan 27, 1951  [facility] of [facility]) CM/SW Contact  Golda Acre, RN Phone Number: 04/26/2021, 8:59 AM  Clinical Narrative:    Date of Surgery: 04/25/2021    Preoperative Diagnosis: SPLENIC RUPTURE    Postoperative Diagnosis: SPLENIC RUPTURE    Surgical Procedure: SPLENECTOMY     Expected Discharge Plan: Home/Self Care    Expected Discharge Plan and Services Expected Discharge Plan: Home/Self Care                                               Social Determinants of Health (SDOH) Interventions    Readmission Risk Interventions Readmission Risk Prevention Plan 07/28/2020  Post Dischage Appt Complete  Medication Screening Complete  Transportation Screening Complete  Some recent data might be hidden

## 2021-04-27 ENCOUNTER — Encounter (HOSPITAL_COMMUNITY): Payer: Self-pay | Admitting: Surgery

## 2021-04-27 DIAGNOSIS — D735 Infarction of spleen: Secondary | ICD-10-CM | POA: Diagnosis not present

## 2021-04-27 LAB — GLUCOSE, CAPILLARY
Glucose-Capillary: 118 mg/dL — ABNORMAL HIGH (ref 70–99)
Glucose-Capillary: 118 mg/dL — ABNORMAL HIGH (ref 70–99)
Glucose-Capillary: 129 mg/dL — ABNORMAL HIGH (ref 70–99)
Glucose-Capillary: 138 mg/dL — ABNORMAL HIGH (ref 70–99)

## 2021-04-27 LAB — CBC
HCT: 24.9 % — ABNORMAL LOW (ref 39.0–52.0)
Hemoglobin: 7.7 g/dL — ABNORMAL LOW (ref 13.0–17.0)
MCH: 27.5 pg (ref 26.0–34.0)
MCHC: 30.9 g/dL (ref 30.0–36.0)
MCV: 88.9 fL (ref 80.0–100.0)
Platelets: 480 10*3/uL — ABNORMAL HIGH (ref 150–400)
RBC: 2.8 MIL/uL — ABNORMAL LOW (ref 4.22–5.81)
RDW: 16.5 % — ABNORMAL HIGH (ref 11.5–15.5)
WBC: 33.9 10*3/uL — ABNORMAL HIGH (ref 4.0–10.5)
nRBC: 2.9 % — ABNORMAL HIGH (ref 0.0–0.2)

## 2021-04-27 LAB — MAGNESIUM: Magnesium: 2.2 mg/dL (ref 1.7–2.4)

## 2021-04-27 LAB — BASIC METABOLIC PANEL
Anion gap: 8 (ref 5–15)
BUN: 14 mg/dL (ref 8–23)
CO2: 28 mmol/L (ref 22–32)
Calcium: 8.3 mg/dL — ABNORMAL LOW (ref 8.9–10.3)
Chloride: 101 mmol/L (ref 98–111)
Creatinine, Ser: 0.59 mg/dL — ABNORMAL LOW (ref 0.61–1.24)
GFR, Estimated: >60 mL/min (ref >60)
Glucose, Bld: 138 mg/dL — ABNORMAL HIGH (ref 70–99)
Potassium: 4.6 mmol/L (ref 3.5–5.1)
Sodium: 137 mmol/L (ref 135–145)

## 2021-04-27 LAB — HEMOGLOBIN AND HEMATOCRIT, BLOOD
HCT: 25.2 % — ABNORMAL LOW (ref 39.0–52.0)
Hemoglobin: 7.8 g/dL — ABNORMAL LOW (ref 13.0–17.0)

## 2021-04-27 LAB — CYTOLOGY - NON PAP

## 2021-04-27 LAB — SURGICAL PATHOLOGY

## 2021-04-27 LAB — PHOSPHORUS: Phosphorus: 3.5 mg/dL (ref 2.5–4.6)

## 2021-04-27 MED ORDER — METHOCARBAMOL 1000 MG/10ML IJ SOLN
500.0000 mg | Freq: Four times a day (QID) | INTRAVENOUS | Status: DC | PRN
  Filled 2021-04-27: qty 5

## 2021-04-27 MED ORDER — TRAVASOL 10 % IV SOLN
INTRAVENOUS | Status: AC
  Filled 2021-04-27: qty 1200

## 2021-04-27 MED ORDER — METOCLOPRAMIDE HCL 5 MG/ML IJ SOLN
10.0000 mg | Freq: Three times a day (TID) | INTRAMUSCULAR | Status: DC
  Administered 2021-04-27 – 2021-05-01 (×13): 10 mg via INTRAVENOUS
  Filled 2021-04-27 (×13): qty 2

## 2021-04-27 NOTE — Evaluation (Signed)
Physical Therapy Evaluation Patient Details Name: Brian Atkinson MRN: [id number] DOB: 11/16/1950 Today's Date: 04/27/2021   History of Present Illness  Patient is a 70 year old male with recent admission for hernia repair, then needing ex lap with small bowel resection 6/27. Patient developed ileus had drain placed on 7/6 and was D/C home on 7/8. Patient returned later that day with abdominal pain, had ruptured spleen. On 7/9 had coil embolization, then open splenectomy 7/18. PMH includes hx ORIF L tibia 2021, ORIA L patella 2015.  Clinical Impression  The patient was premedicated for pain and  participated in  mobility. Patient ambulated x 8' in room with RW. Patient's HR up to 135. Patient should progress   with mobility. May benefit from post acute  rehab if slower progress.  Pt admitted with above diagnosis.  Pt currently with functional limitations due to the deficits listed below (see PT Problem List). Pt will benefit from skilled PT to increase their independence and safety with mobility to allow discharge to the venue listed below.       Follow Up Recommendations CIR vs SNF/HH, depending on progress.    Equipment Recommendations  Rolling walker with 5" wheels    Recommendations for Other Services       Precautions / Restrictions Precautions Precautions: Fall Precaution Comments: L drain; abd surgery Restrictions Weight Bearing Restrictions: No      Mobility  Bed Mobility Overal bed mobility: Needs Assistance Bed Mobility: Supine to Sit     Supine to sit: Min assist;+2 for physical assistance;+2 for safety/equipment     General bed mobility comments: cue patient for log roll technique however more so pulled straight up on bed rails then  slides legs to edge of bed, needing min A x1-2 for trunk support and line management    Transfers Overall transfer level: Needs assistance Equipment used: Rolling walker (2 wheeled) Transfers: Sit to/from Stand Sit to Stand:  Min assist;+2 physical assistance;+2 safety/equipment;From elevated surface         General transfer comment: cues for hand placement and min A x2 to power up to standing from elevated bed height.  Ambulation/Gait Ambulation/Gait assistance: Min assist;+2 safety/equipment Gait Distance (Feet): 8 Feet Assistive device: Rolling walker (2 wheeled) Gait Pattern/deviations: Step-through pattern Gait velocity: decr   General Gait Details: patient reliant on Rw, moves slowly  Stairs            Wheelchair Mobility    Modified Rankin (Stroke Patients Only)       Balance Overall balance assessment: Needs assistance Sitting-balance support: Feet supported Sitting balance-Leahy Scale: Fair     Standing balance support: Bilateral upper extremity supported Standing balance-Leahy Scale: Poor Standing balance comment: reliant on UE support                             Pertinent Vitals/Pain Pain Assessment: Faces Pain Score: 4  Faces Pain Scale: Hurts even more Pain Location: abdomen Pain Descriptors / Indicators: Discomfort;Sore Pain Intervention(s): Premedicated before session;Monitored during session    Home Living Family/patient expects to be discharged to:: Private residence Living Arrangements: Spouse/significant other Available Help at Discharge: Family;Available 24 hours/day Type of Home: House Home Access: Level entry     Home Layout: Able to live on main level with bedroom/bathroom;Multi-level;Full bath on main level Home Equipment: Walker - 2 wheels;Cane - single point;Crutches;Bedside commode;Wheelchair - manual Additional Comments: states he has a wheelchair ordered from [employer]  Prior Function Level of Independence: Independent with assistive device(s)         Comments: patient reports using cane and walker prior to admission     Hand Dominance   Dominant Hand: Right    Extremity/Trunk Assessment   Upper Extremity Assessment Upper  Extremity Assessment: Overall WFL for tasks assessed    Lower Extremity Assessment Lower Extremity Assessment: Generalized weakness    Cervical / Trunk Assessment Cervical / Trunk Assessment: Normal;Other exceptions Cervical / Trunk Exceptions: guarded abdomen when mobilizing  Communication   Communication: No difficulties  Cognition Arousal/Alertness: Awake/alert Behavior During Therapy: WFL for tasks assessed/performed;Anxious Overall Cognitive Status: Within Functional Limits for tasks assessed                                        General Comments      Exercises     Assessment/Plan    PT Assessment Patient needs continued PT services  PT Problem List Decreased strength;Decreased mobility;Decreased safety awareness;Decreased activity tolerance;Decreased knowledge of precautions;Decreased balance;Cardiopulmonary status limiting activity;Decreased knowledge of use of DME;Pain       PT Treatment Interventions DME instruction;Therapeutic activities;Gait training;Therapeutic exercise;Patient/family education;Functional mobility training    PT Goals (Current goals can be found in the Care Plan section)  Acute Rehab PT Goals Patient Stated Goal: agreeable to get up to chair Time For Goal Achievement: 05/11/21 Potential to Achieve Goals: Good    Frequency Min 3X/week   Barriers to discharge        Co-evaluation PT/OT/SLP Co-Evaluation/Treatment: Yes Reason for Co-Treatment: Complexity of the patient's impairments (multi-system involvement);For patient/therapist safety PT goals addressed during session: Mobility/safety with mobility OT goals addressed during session: ADL's and self-care       AM-PAC PT "6 Clicks" Mobility  Outcome Measure Help needed turning from your back to your side while in a flat bed without using bedrails?: A Lot Help needed moving from lying on your back to sitting on the side of a flat bed without using bedrails?: A Lot Help  needed moving to and from a bed to a chair (including a wheelchair)?: A Lot Help needed standing up from a chair using your arms (e.g., wheelchair or bedside chair)?: A Lot Help needed to walk in hospital room?: A Lot Help needed climbing 3-5 steps with a railing? : Total 6 Click Score: 11    End of Session   Activity Tolerance: Patient limited by fatigue Patient left: in chair;with call bell/phone within reach Nurse Communication: Mobility status PT Visit Diagnosis: Unsteadiness on feet (R26.81);Pain    Time: 1230-1250 PT Time Calculation (min) (ACUTE ONLY): 20 min   Charges:   PT Evaluation $PT Eval Low Complexity: 1 Low          Blanchard Kelch PT Acute Rehabilitation Services Pager [phone] Office [phone]   Rada Hay 04/27/2021, 2:59 PM

## 2021-04-27 NOTE — Progress Notes (Addendum)
Nutrition Follow-up  DOCUMENTATION CODES:   Non-severe (moderate) malnutrition in context of acute illness/injury, Obesity unspecified  INTERVENTION:  - will order Boost Breeze BID, each supplement provides 250 kcal and 9 grams of protein. - will order 30 ml Prosource Plus once/day, each supplement provides 100 kcal and 15 grams protein.  - continue TPN per Pharmacist. - continue diet advancement as medically feasible.    NUTRITION DIAGNOSIS:   Moderate Malnutrition related to acute illness (recent abdominal surgeries) as evidenced by mild fat depletion, mild muscle depletion. -ongoing  GOAL:   Patient will meet greater than or equal to 90% of their needs -met with TPN and very minimal PO intake  MONITOR:   PO intake, Supplement acceptance, Diet advancement, Labs, Weight trends, Other (Comment) (TPN regimen)  ASSESSMENT:   70 yo male with medical history of MRSA, osteomyelitis, and recent admission for ventral hernia repair with mesh complicated by SBO, intraparietal hernia, and small bowel perforation with resultant removal of mesh, SBR, and retromuscular repair. He required TPN during that admission and was discharged off TPN, on Soft diet on 7/8. He returned to the ED on 7/9 d/t worsening severe abdominal pain, nausea, and several syncopal episodes.  Significant Events: 7/9- re-admission 7/11- diet advanced from NPO to Regular 7/11- diet downgraded to CLD 7/13- initial RD assessment; double lumen PICC placed in R basilic; TPN initiation 3/89- emergent splenectomy;made NPO at 2130 7/19- diet advanced to FLD   He was on CLD from 7/12 at 1625 until 7/18 at 2130 when he was changed to NPO. He was re-advanced to [place] yesterday at 0935.   No meal intakes documented since 7/15 when he he consumed 0% at all meals.   Will order oral nutrition supplements as outlined above.   Patient sleeping. He refused to work with therapy around 1000 and again a few minutes ago, requesting them  to return another time.   Able to talk with RN who reports that patient had sips of juice and a sip or two of coffee for breakfast. Patient has been resistant to consuming more than sips and is resistant to encouragement to try items or to consume more than he is. He has had abdominal pain and nausea this AM.   He is receiving TPN at goal rate of 100 ml/hr which is providing 2347 kcal and 120 grams protein.   Weight on 7/9 was 229 lb and he was weighed on 7/17 at which time he weighed 241 lb. Non-pitting edema to BUE documented in the edema section of flow sheet.  Noted to be +14 L this admission.    Per notes: - L pleural effusion with LLL atelectasis--s/p thoracentesis on 7/17 with 400 ml dark reddish-yellow fluid removed - large subcapsular splenic hematoma and perisplenic hemorrhage s/p splenic artery embolization s/p emergent splenectomy    Labs reviewed; CBGs: 138 and 129 mg/dl, creatinine: 0.59 mg/dl, Ca: 8.3 mg/dl. Medications reviewed; sliding scale novolog, 10 mg reglan TID, 40 mg oral protonix BID, 625 mg fibercon BID, 17 g miralax/day.    Diet Order:   Diet Order             Diet full liquid Room service appropriate? Yes; Fluid consistency: Thin  Diet effective now                   EDUCATION NEEDS:   Not appropriate for education at this time  Skin:  Skin Assessment: Skin Integrity Issues: Skin Integrity Issues:: Incisions Incisions: abdomen (6/17 + 7/18);  R groin (7/9)  Last BM:  7/17  Height:   Ht Readings from Last 1 Encounters:  04/16/21 5' 11.5" (1.816 m)    Weight:   Wt Readings from Last 1 Encounters:  04/24/21 109.4 kg      Estimated Nutritional Needs:  Kcal:  2250-2500 kcal Protein:  125-140 grams Fluid:  >/= 2.5 L/day      Jarome Matin, MS, RD, LDN, CNSC Inpatient Clinical Dietitian RD pager # available in AMION  After hours/weekend pager # available in [facility]

## 2021-04-27 NOTE — Progress Notes (Signed)
PT Cancellation Note  Patient Details Name: Brian Atkinson MRN: [id number] DOB: 1950-11-14   Cancelled Treatment:    Reason Eval/Treat Not Completed: Patient declined, stated  that he took medication and wants to sleep. Stated he would later or his wife  and daughter would get him up. Will check back  another time.   Rada Hay 04/27/2021, 10:04 AM Blanchard Kelch PT Acute Rehabilitation Services Pager [phone] Office [phone]

## 2021-04-27 NOTE — Progress Notes (Addendum)
NAME:  Brian Atkinson, MRN:  [id number], DOB:  20-Dec-1950, LOS: 11 ADMISSION DATE:  04/15/2021 CONSULTATION DATE:  04/23/2021 REFERRING MD:  [name] - CCS CHIEF COMPLAINT:  L pleural effusion s/p multiple abdominal surgeries and splenic artery embolization   History of Present Illness:  70 year old never smoker with PMHx significant for osteomyelitis of the patella, MRSA infection and incisional hernia (s/p initial repair 6/20) with complicated postoperative course requiring exploratory laparotomy and small bowel resection with mesh reconstruction.   Readmitted 7/8 with ruptured spleen prompting coil embolization of splenic artery by IR 7/9. Developed fever and new abdominal pain 7/18 with persistent leukocytosis. 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  PCCM consulted for management of left lower lobe atelectasis and pleural effusion noted on CT abdomen 7/16.  Pertinent Medical History:  Left Patella Fracture  Osteomyelitis of the Patella MRSA Infection post ABD surgery  Hernia s/p repair 6/20 with complex post-op course - small bowel resection with mesh  Significant Hospital Events: Including procedures, antibiotic start and stop dates in addition to other pertinent events   6/20 Incisional hernia repair 6/27 Laparotomy, small bowel resection and mesh reconstruction 7/9 IR Splenic artery embolization 7/16 PCCM consulted for LLL atelectasis and L pleural effusion 7/17 Thoracentesis completed for L pleural effusion 7/18 New fever, persistent leukocytosis, increasing abdominal pain. CT with worsening splenic infarct +new gas lucency c/f abscess formation. Taken to OR for splenectomy. 7/19 POD1 from emergent splenectomy, pt removed NGT overnight. PCA for pain control   Interim History / Subjective:  Tmax 99.8 / WBC 33.9 (up from 18) On RA  Glucose range  129-138 Pleural fluid culture pending  Objective:  Blood pressure (!) 160/68, pulse (!) 114, temperature 99.8 F (37.7 C), temperature source Oral, resp. rate 18, height 5' 11.5" (1.816 m), weight 109.4 kg, SpO2 92 %.    FiO2 (%):  [21 %] 21 %   Intake/Output Summary (Last 24 hours) at 04/27/2021 0806 Last data filed at 04/27/2021 2536 Gross per 24 hour  Intake 2556.58 ml  Output 1530 ml  Net 1026.58 ml   Filed Weights   04/16/21 0650 04/24/21 0427  Weight: 103.9 kg 109.4 kg   Physical Examination: General: adult male lying in bed in NAD, appears uncomfortable/tired HEENT: MM pink/moist, no jvd, anicteric, pupils 2-32mm Neuro: AAOx4, speech clear, MAE CV: s1s2 RRR, no m/r/g PULM: non-labored at rest on RA, lungs bilaterally clear GI: soft, bsx4 active, protuberant, LUQ surgical incision c/d/intact, old surgical incision midline -healing. LLQ drain with bloody fluid Extremities: warm/dry, trace to 1+ BLE edema  Skin: no rashes or lesions  Resolved Hospital Problem List:    Assessment & Plan:  70 year old gentleman is seen in consultation at the request of the surgery team for recommendations on further evaluation and management of left pleural effusion.  Exudative Left pleural effusion with left lower lobe atelectasis Differential diagnosis of left pleural effusion includes reactive effusion versus hemothorax related to splenic infarct, less likely infection. Bedside US by PCCM did not reveal a safe window for bedside thora. Underwent IR thoracentesis 7/17. Pleural fluid exudative by Light's. -follow pleural culture  -completed cefepime 7/19 -monitor WBC / fever curve  -pulmonary hygiene - IS, mobilize  -follow intermittent CXR  Large subcapsular splenic hematoma and perisplenic hemorrhage, s/p IR splenic artery embolization S/p splenectomy Last CT A/P 7/16 demonstrating interval coil embolization of the mid to distal splenic artery with  development of a probably infarct  within the midbody of the spleen, stable large subcapsular splenic hematoma and moderate perisplenic hemorrhage. New fever, worsening abdominal pain and leukocytosis prompted repeat CT 7/18 showing worsening splenic infarct with gas lucency c/f abscess formation. Patient taken emergently to the OR for splenectomy. -POD#2  -postoperative wound care and pain control per surgery  -mobilize as able  -diet as tolerated  -will need to ensure he is up to date on immunizations > rec's for 2 weeks post splenectomy for immunization  Anemia  Baseline early July 2022 9-10 -trend CBC  -transfuse for Hgb <7%, active bleeding   Hyperglycemia  -SSI, sensitive scale   Best Practice: (right click and "Reselect all SmartList Selections" daily)  Diet/type: full liquids  and TPN DVT prophylaxis: LMWH GI prophylaxis: PPI Lines: Central line - PICC Foley:  N/A Code Status: full code Last date of multidisciplinary goals of care discussion- per primary team    PCCM will be available PRN.  Will ask TRH to assist primary team with medical management.   Critical care time: N/A   Canary Brim, NP [place] 04/27/21 8:06 AM  Please see [email or web address] for pager details.  From 7A-7P if no response, please call [phone] After hours, please call ELink [phone]

## 2021-04-27 NOTE — Evaluation (Signed)
Occupational Therapy Evaluation Patient Details Name: Brian Atkinson MRN: [id number] DOB: Sep 03, 1951 Today's Date: 04/27/2021    History of Present Illness Patient is a 70 year old male with recent admission for hernia repair, then needing ex lap with small bowel resection 6/27. Patient developed ileus had drain placed on 7/6 and was D/C home on 7/8. Patient returned later that day with abdominal pain, had ruptured spleen. On 7/9 had coil embolization, then open splenectomy 7/18. PMH includes hx ORIF L tibia 2021, ORIA L patella 2015.   Clinical Impression   Patient with recent admission for abdominal sx, now s/p open splenectomy and needing increased assistance with self care and functional mobility. Patient min A x2 to sit trunk upright to edge of bed. Bed height elevated and cues for hand placement with min x2 to power up to standing and steady. Patient then min x1 with walker to ambulate ~6 ft in room with additional assist for line management + chair follow. Patient will need max A for lower body ADLs due to abdominal pain, limited activity tolerance, would benefit from [facility] AE education. Currently recommend inpatient rehab due to deficits and motivated to return home with family support, will continue to follow acutely.     Follow Up Recommendations  CIR    Equipment Recommendations  Tub/shower seat       Precautions / Restrictions Precautions Precautions: Fall Precaution Comments: L drain; abd surgery Restrictions Weight Bearing Restrictions: No      Mobility Bed Mobility Overal bed mobility: Needs Assistance Bed Mobility: Supine to Sit     Supine to sit: Min assist;+2 for physical assistance;+2 for safety/equipment     General bed mobility comments: cue patient for log roll technique however moreso slides legs to edge of bed, needing min A x1-2 for trunk support and line management    Transfers Overall transfer level: Needs assistance Equipment used: Rolling walker  (2 wheeled) Transfers: Sit to/from Stand Sit to Stand: Min assist;+2 physical assistance;+2 safety/equipment;From elevated surface         General transfer comment: cues for hand placement and min A x2 to power up to standing from elevated bed height.    Balance Overall balance assessment: Needs assistance Sitting-balance support: Feet supported Sitting balance-Leahy Scale: Fair     Standing balance support: Bilateral upper extremity supported Standing balance-Leahy Scale: Poor Standing balance comment: reliant on UE support                           ADL either performed or assessed with clinical judgement   ADL Overall ADL's : Needs assistance/impaired Eating/Feeding: Independent Eating/Feeding Details (indicate cue type and reason): clear liquids Grooming: Set up;Sitting   Upper Body Bathing: Minimal assistance;Sitting   Lower Body Bathing: Maximal assistance;Sitting/lateral leans;Sit to/from stand Lower Body Bathing Details (indicate cue type and reason): 2* abdominal pain Upper Body Dressing : Minimal assistance;Sitting   Lower Body Dressing: Maximal assistance;Sitting/lateral leans;Sit to/from stand Lower Body Dressing Details (indicate cue type and reason): 2* abdominal pain Toilet Transfer: Minimal assistance;+2 for physical assistance;+2 for safety/equipment;Cueing for safety;Stand-pivot;Ambulation;RW Toilet Transfer Details (indicate cue type and reason): min x2 to power up to standing from elevated bed height, min A x1 ambulating ~41ft in room with additional assist for line management. needing cues to push from surface vs pull on walker to stand Toileting- Clothing Manipulation and Hygiene: Maximal assistance;Sitting/lateral lean;Sit to/from stand       Functional mobility during ADLs: Minimal  assistance;+2 for safety/equipment;Cueing for safety;Cueing for sequencing;Rolling walker General ADL Comments: patient requiring increased assistance for self  care tasks due to multiple abdominal sx causing pain and limiting activity tolerance      Pertinent Vitals/Pain Pain Assessment: Faces Faces Pain Scale: Hurts even more Pain Location: abdomen Pain Descriptors / Indicators: Discomfort;Sore Pain Intervention(s): Premedicated before session     Hand Dominance Right   Extremity/Trunk Assessment Upper Extremity Assessment Upper Extremity Assessment: Overall WFL for tasks assessed   Lower Extremity Assessment Lower Extremity Assessment: Defer to PT evaluation   Cervical / Trunk Assessment Cervical / Trunk Assessment: Normal   Communication Communication Communication: No difficulties   Cognition Arousal/Alertness: Awake/alert Behavior During Therapy: WFL for tasks assessed/performed Overall Cognitive Status: Within Functional Limits for tasks assessed                                                Home Living Family/patient expects to be discharged to:: Private residence Living Arrangements: Spouse/significant other Available Help at Discharge: Family;Available 24 hours/day Type of Home: House Home Access: Level entry     Home Layout: Able to live on main level with bedroom/bathroom;Multi-level;Full bath on main level Alternate Level Stairs-Number of Steps: 12 Alternate Level Stairs-Rails: Left;Right;Can reach both Bathroom Shower/Tub: Producer, television/film/video: Standard     Home Equipment: Environmental consultant - 2 wheels;Cane - single point;Crutches;Bedside commode;Wheelchair - manual   Additional Comments: states he has a wheelchair ordered from [facility]      Prior Functioning/Environment Level of Independence: Independent with assistive device(s)        Comments: patient reports using cane and walker prior to admission        OT Problem List: Pain;Obesity;Decreased activity tolerance;Impaired balance (sitting and/or standing);Decreased safety awareness;Decreased knowledge of use of DME or  AE;Decreased knowledge of precautions      OT Treatment/Interventions: Self-care/ADL training;Balance training;Patient/family education;Therapeutic activities;DME and/or AE instruction    OT Goals(Current goals can be found in the care plan section) Acute Rehab OT Goals Patient Stated Goal: agreeable to get up to chair OT Goal Formulation: With patient Time For Goal Achievement: 05/11/21 Potential to Achieve Goals: Good  OT Frequency: Min 2X/week           Co-evaluation PT/OT/SLP Co-Evaluation/Treatment: Yes Reason for Co-Treatment: Complexity of the patient's impairments (multi-system involvement);To address functional/ADL transfers;For patient/therapist safety PT goals addressed during session: Mobility/safety with mobility OT goals addressed during session: ADL's and self-care      AM-PAC OT "6 Clicks" Daily Activity     Outcome Measure Help from another person eating meals?: None Help from another person taking care of personal grooming?: A Little Help from another person toileting, which includes using toliet, bedpan, or urinal?: A Lot Help from another person bathing (including washing, rinsing, drying)?: A Lot Help from another person to put on and taking off regular upper body clothing?: A Little Help from another person to put on and taking off regular lower body clothing?: A Lot 6 Click Score: 16   End of Session Equipment Utilized During Treatment: Rolling walker Nurse Communication: Mobility status  Activity Tolerance: Patient tolerated treatment well Patient left: in chair;with call bell/phone within reach  OT Visit Diagnosis: Unsteadiness on feet (R26.81);Other abnormalities of gait and mobility (R26.89);Pain Pain - part of body:  (abdomen)  Time: 5974-1638 OT Time Calculation (min): 15 min Charges:  OT General Charges $OT Visit: 1 Visit OT Evaluation $OT Eval Moderate Complexity: 1 Mod  Marlyce Huge OT OT pager: [phone]  Carmelia Roller 04/27/2021, 2:03 PM

## 2021-04-27 NOTE — Progress Notes (Addendum)
Progress Note: General Surgery Service   Chief Complaint/Subjective: Incisional pain and cramping after surgery.  Tolerating clear liquids, asking for diet advancement.  Passing flatus, no bowel movements.  Objective: Vital signs in last 24 hours: Temp:  [98.5 F (36.9 C)-99.8 F (37.7 C)] 99.8 F (37.7 C) (07/20 0300) Pulse Rate:  [71-125] 114 (07/20 0600) Resp:  [11-27] 18 (07/20 0600) BP: (106-166)/(46-130) 160/68 (07/20 0600) SpO2:  [88 %-95 %] 92 % (07/20 0600) Arterial Line BP: (141-158)/(69-74) 158/74 (07/19 1300) FiO2 (%):  [21 %] 21 % (07/19 0923) Last BM Date: 04/24/21  Intake/Output from previous day: 07/19 0701 - 07/20 0700 In: 2556.6 [I.V.:2379.3; IV Piggyback:177.3] Out: 1730 [Urine:1555; Drains:175] Intake/Output this shift: No intake/output data recorded.  GI: Abd Distended, incisions c/d/I, midline healing well, LUQ subcostal incision with dermabond c/d/i   Lab Results: CBC  Recent Labs    04/26/21 0247 04/27/21 0246  WBC 20.9* 33.9*  HGB 8.9* 7.7*  HCT 29.4* 24.9*  PLT 431* 480*    BMET Recent Labs    04/26/21 0247 04/27/21 0246  NA 136 137  K 4.7 4.6  CL 103 101  CO2 27 28  GLUCOSE 152* 138*  BUN 13 14  CREATININE 0.65 0.59*  CALCIUM 8.0* 8.3*    PT/INR No results for input(s): LABPROT, INR in the last 72 hours.  ABG Recent Labs    04/25/21 2030  PHART 7.395  HCO3 25.2     Anti-infectives: Anti-infectives (From admission, onward)    Start     Dose/Rate Route Frequency Ordered Stop   04/24/21 1400  vancomycin (VANCOREADY) IVPB 1000 mg/200 mL  Status:  Discontinued        1,000 mg 200 mL/hr over 60 Minutes Intravenous Every 12 hours 04/24/21 0229 04/24/21 1326   04/24/21 0200  vancomycin (VANCOREADY) IVPB 2000 mg/400 mL        2,000 mg 200 mL/hr over 120 Minutes Intravenous  Once 04/24/21 0102 04/24/21 0324   04/24/21 0200  ceFEPIme (MAXIPIME) 2 g in sodium chloride 0.9 % 100 mL IVPB  Status:  Discontinued        2 g 200  mL/hr over 30 Minutes Intravenous Every 8 hours 04/24/21 0102 04/26/21 1208   04/16/21 1800  piperacillin-tazobactam (ZOSYN) IVPB 3.375 g  Status:  Discontinued       Note to Pharmacy: Pharmacy may adjust dosing strength, schedule, rate of infusion, etc as needed to optimize therapy   3.375 g 12.5 mL/hr over 240 Minutes Intravenous Every 8 hours 04/16/21 1640 04/18/21 1014   04/16/21 1730  piperacillin-tazobactam (ZOSYN) IVPB 3.375 g  Status:  Discontinued       Note to Pharmacy: Pharmacy may adjust dosing strength, schedule, rate of infusion, etc as needed to optimize therapy   3.375 g 12.5 mL/hr over 240 Minutes Intravenous Every 8 hours 04/16/21 1640 04/16/21 1640   04/16/21 1000  cefTRIAXone (ROCEPHIN) 2 g in sodium chloride 0.9 % 100 mL IVPB  Status:  Discontinued       Note to Pharmacy: Pharmacy may adjust dosing strength / duration / interval for maximal efficacy   2 g 200 mL/hr over 30 Minutes Intravenous Every 24 hours 04/16/21 0828 04/16/21 1640   04/16/21 1000  metroNIDAZOLE (FLAGYL) IVPB 500 mg  Status:  Discontinued        500 mg 100 mL/hr over 60 Minutes Intravenous Every 6 hours 04/16/21 0828 04/16/21 1640   04/16/21 0100  piperacillin-tazobactam (ZOSYN) IVPB 3.375 g  3.375 g 12.5 mL/hr over 240 Minutes Intravenous  Once 04/16/21 0057 04/16/21 0143       Medications: Scheduled Meds:  sodium chloride   Intravenous Once   Chlorhexidine Gluconate Cloth  6 each Topical Q0600   enoxaparin (LOVENOX) injection  40 mg Subcutaneous Q24H   gabapentin  300 mg Oral TID   insulin aspart  0-9 Units Subcutaneous Q8H   lip balm  1 application Topical BID   mouth rinse  15 mL Mouth Rinse BID   pantoprazole  40 mg Oral BID   polycarbophil  625 mg Oral BID   polyethylene glycol  17 g Oral Daily   sodium phosphate  1 enema Rectal Once   Continuous Infusions:  TPN ADULT (ION) 100 mL/hr at 04/27/21 0648   PRN Meds:.acetaminophen, alum & mag hydroxide-simeth, bisacodyl,  hydrALAZINE, HYDROmorphone (DILAUDID) injection, magic mouthwash, menthol-cetylpyridinium, metoprolol tartrate, ondansetron **OR** [DISCONTINUED] ondansetron (ZOFRAN) IV, oxyCODONE, oxyCODONE, phenol, prochlorperazine, simethicone  Assessment/Plan: Mr. Allbaugh is a 70 year old male who underwent recurrent incisional hernia repair with removal of previous intraperitoneal mesh and rives stoppa style repair.  His posterior layer broke down where the previous mesh had been removed requiring takeback to the operating room for diagnostic laparotomy converted to exploratory laparotomy, small bowel resection with vicryl mesh reconstruction of the posterior layer and phasix retromuscular mesh placement on 04/04/2021. He recovered from this in the hospital, with a prolonged postoperative ileus which was not unexpected due to the large interparietal hernia.  A CT guided drain was placed on 04/13/21, to drain a pelvic fluid collection.  He was discharged on 04/15/21.  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  On 04/16/21, he underwent coil embolization of the splenic artery.  He failed to improve symptomatically after embolization and was taken for open splenectomy on 04/25/21.    Leukocytosis likely 2/2 splenectomy, monitor for other signs of infection Try full liquid diet today Increase activity - encouraged patient to work with physical therapy and occupational therapy Incentive spirometer Monitor JP drain output Acute blood loss anemia - recheck hgb this afternoon PICC, TPN    LOS: 11 days   FEN: FLD, TPN till he tolerates sufficient PO caloric intake, continue full dose TPN today ID: None VTE: SCDs, lovenox Foley: Removed Dispo: Continued care in stepdown    Quentin Ore, MD  [facility] Use [email or web address] to contact on call provider

## 2021-04-27 NOTE — Progress Notes (Signed)
PHARMACY - TOTAL PARENTERAL NUTRITION CONSULT NOTE   Indication: Prolonged ileus  Patient Measurements: Height: 5' 11.5" (181.6 cm) Weight: 109.4 kg (241 lb 2.9 oz) IBW/kg (Calculated) : 76.45   Body mass index is 33.17 kg/m.  Recent Labs    04/25/21 0340 04/25/21 2030 04/26/21 0247 04/27/21 0246  NA 133*   < > 136 137  K 4.3   < > 4.7 4.6  CL 100  --  103 101  CO2 24  --  27 28  GLUCOSE 136*  --  152* 138*  BUN 15  --  13 14  CREATININE 0.57*  --  0.65 0.59*  CALCIUM 8.2*  --  8.0* 8.3*  PHOS 3.5  --  2.8 3.5  MG 1.9  --  1.8 2.2  ALBUMIN 2.4*  --   --   --   ALKPHOS 113  --   --   --   AST 40  --   --   --   ALT 32  --   --   --   BILITOT 0.7  --   --   --   TRIG 64  --   --   --   PREALBUMIN 7.3*  --   --   --    < > = values in this interval not displayed.     Assessment:  70 yo male s/p ventral hernia repair with mesh on 6/20. 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 He had a postoperative ileus for which he was on TPN. He also developed a pelvic abscess which was drained by IR on 7/6. His surgical retrorectus drain was removed on 7/7. Prior to discharge he was tolerating a diet and TPN was stopped. He was discharged home 7/8.  He was readmitted 7/9 with hypotension, AKI and leukocytosis.  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  On 04/16/21, he underwent coil embolization of the splenic artery.  He has not been tolerating diet, so Pharmacy has been consulted to start TPN.  Glucose / Insulin: No hx DM - CBGs 130-159  - sensitive SSI:  3 units / 24h Electrolytes: WNL.  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  Central access: PICC 7/13 TPN start date: 7/13  Nutritional Goals (updated RD recommendation on 7/13): kCal: 2250-2350, Protein: 115-130, Fluid: 2.4L/day Goal TPN rate is 100 mL/hr (provides 120 g of protein and 2347 kcals per day)  Current Nutrition:  Full liquids and TPN 7/19 Pt removed NG tube on 7/19  Plan:  Continue TPN at goal rate of 100 mL/hr Electrolytes in TPN:  Na to 12mEq/L  K 35 mEq/L Ca 10 mEq/L  Mg 7 mEq/L  Phos 18mmol/L  Cl:Ac 1:1 Add standard MVI and trace elements to  TPN Continue Sensitive SSI decreased to q8h and adjust as needed  IVF per MD - none currently Monitor TPN labs on Mon/Thurs   Lynann Beaver PharmD, BCPS Clinical Pharmacist [facility] main pharmacy [phone] 04/27/2021 9:05 AM

## 2021-04-27 NOTE — Progress Notes (Signed)
Inpatient Rehab Admissions Coordinator:   Per therapy recommendations pt was recommended for CIR. At this time we are recommending a CIR consult and I will place an order per our protocol.   Estill Dooms, PT, DPT Admissions Coordinator [phone] 04/27/21  3:19 PM

## 2021-04-27 NOTE — Anesthesia Postprocedure Evaluation (Signed)
Anesthesia Post Note  Patient: Brian Atkinson  Procedure(s) Performed: SPLENECTOMY (Abdomen)     Patient location during evaluation: PACU Anesthesia Type: General Level of consciousness: awake and alert Pain management: pain level controlled Vital Signs Assessment: post-procedure vital signs reviewed and stable Respiratory status: spontaneous breathing, nonlabored ventilation, respiratory function stable and patient connected to nasal cannula oxygen Cardiovascular status: blood pressure returned to baseline and stable Postop Assessment: no apparent nausea or vomiting Anesthetic complications: no   No notable events documented.  Last Vitals:  Vitals:   04/27/21 0500 04/27/21 0600  BP: (!) 152/81 (!) 160/68  Pulse: (!) 113 (!) 114  Resp: 20 18  Temp:    SpO2: 92% 92%    Last Pain:  Vitals:   04/27/21 0400  TempSrc:   PainSc: 0-No pain                 Adriana Lina S

## 2021-04-28 ENCOUNTER — Inpatient Hospital Stay (HOSPITAL_COMMUNITY): Payer: Medicare Other

## 2021-04-28 DIAGNOSIS — E440 Moderate protein-calorie malnutrition: Secondary | ICD-10-CM

## 2021-04-28 LAB — COMPREHENSIVE METABOLIC PANEL
ALT: 191 U/L — ABNORMAL HIGH (ref 0–44)
AST: 282 U/L — ABNORMAL HIGH (ref 15–41)
Albumin: 1.9 g/dL — ABNORMAL LOW (ref 3.5–5.0)
Alkaline Phosphatase: 136 U/L — ABNORMAL HIGH (ref 38–126)
Anion gap: 8 (ref 5–15)
BUN: 14 mg/dL (ref 8–23)
CO2: 26 mmol/L (ref 22–32)
Calcium: 8.1 mg/dL — ABNORMAL LOW (ref 8.9–10.3)
Chloride: 100 mmol/L (ref 98–111)
Creatinine, Ser: 0.59 mg/dL — ABNORMAL LOW (ref 0.61–1.24)
GFR, Estimated: >60 mL/min (ref >60)
Glucose, Bld: 122 mg/dL — ABNORMAL HIGH (ref 70–99)
Potassium: 4.7 mmol/L (ref 3.5–5.1)
Sodium: 134 mmol/L — ABNORMAL LOW (ref 135–145)
Total Bilirubin: 0.6 mg/dL (ref 0.3–1.2)
Total Protein: 6 g/dL — ABNORMAL LOW (ref 6.5–8.1)

## 2021-04-28 LAB — CBC
HCT: 25 % — ABNORMAL LOW (ref 39.0–52.0)
Hemoglobin: 7.7 g/dL — ABNORMAL LOW (ref 13.0–17.0)
MCH: 27 pg (ref 26.0–34.0)
MCHC: 30.8 g/dL (ref 30.0–36.0)
MCV: 87.7 fL (ref 80.0–100.0)
Platelets: 484 10*3/uL — ABNORMAL HIGH (ref 150–400)
RBC: 2.85 MIL/uL — ABNORMAL LOW (ref 4.22–5.81)
RDW: 16.7 % — ABNORMAL HIGH (ref 11.5–15.5)
WBC: 26.4 10*3/uL — ABNORMAL HIGH (ref 4.0–10.5)
nRBC: 4.9 % — ABNORMAL HIGH (ref 0.0–0.2)

## 2021-04-28 LAB — BODY FLUID CULTURE W GRAM STAIN: Culture: NO GROWTH

## 2021-04-28 LAB — PHOSPHORUS: Phosphorus: 4.1 mg/dL (ref 2.5–4.6)

## 2021-04-28 LAB — GLUCOSE, CAPILLARY
Glucose-Capillary: 132 mg/dL — ABNORMAL HIGH (ref 70–99)
Glucose-Capillary: 117 mg/dL — ABNORMAL HIGH (ref 70–99)
Glucose-Capillary: 126 mg/dL — ABNORMAL HIGH (ref 70–99)

## 2021-04-28 LAB — MAGNESIUM: Magnesium: 2 mg/dL (ref 1.7–2.4)

## 2021-04-28 MED ORDER — FUROSEMIDE 10 MG/ML IJ SOLN
20.0000 mg | Freq: Once | INTRAMUSCULAR | Status: AC
  Administered 2021-04-28: 20 mg via INTRAVENOUS
  Filled 2021-04-28: qty 2

## 2021-04-28 MED ORDER — POTASSIUM PHOSPHATES 150 MMOLE/50ML IV SOLN
INTRAVENOUS | Status: AC
Start: 2021-04-28 — End: 2021-04-29
  Filled 2021-04-28: qty 1200

## 2021-04-28 MED ORDER — ACETAMINOPHEN 325 MG PO TABS
650.0000 mg | ORAL_TABLET | Freq: Four times a day (QID) | ORAL | Status: DC
  Administered 2021-04-28 – 2021-05-01 (×12): 650 mg via ORAL
  Filled 2021-04-28 (×13): qty 2

## 2021-04-28 MED ORDER — ALTEPLASE 2 MG IJ SOLR
2.0000 mg | Freq: Once | INTRAMUSCULAR | Status: AC
  Administered 2021-04-28: 2 mg
  Filled 2021-04-28: qty 2

## 2021-04-28 NOTE — TOC Progression Note (Signed)
Transition of Care [facility]) - Progression Note    Patient Details  Name: Brian Atkinson MRN: [id number] Date of Birth: 08/06/1951  [facility] of [facility]) CM/SW Contact  Golda Acre, RN Phone Number: 04/28/2021, 7:33 AM  Clinical Narrative:    Date of Surgery: 04/25/2021    Preoperative Diagnosis: SPLENIC RUPTURE    Postoperative Diagnosis: SPLENIC RUPTURE    Surgical Procedure: SPLENECTOMY   Expected Discharge Plan: IP Rehab Facility    Expected Discharge Plan and Services Expected Discharge Plan: IP Rehab Facility                                               Social Determinants of Health (SDOH) Interventions    Readmission Risk Interventions Readmission Risk Prevention Plan 07/28/2020  Post Dischage Appt Complete  Medication Screening Complete  Transportation Screening Complete  Some recent data might be hidden

## 2021-04-28 NOTE — Progress Notes (Signed)
[facility] PROGRESS NOTE   Brian Atkinson VZD:[id number] DOB: 1951-10-07 DOA: 04/15/2021  PCP: Shelly Rubenstein, MD  Brief History/Interval Summary: 70 year old never smoker with PMHx significant for osteomyelitis of the patella, MRSA infection and incisional hernia (s/p initial repair 6/20) with complicated postoperative course requiring exploratory laparotomy and small bowel resection with mesh reconstruction.   Readmitted 7/8 with ruptured spleen prompting coil embolization of splenic artery by IR 7/9. Developed fever and new abdominal pain 7/18 with persistent leukocytosis. 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  Patient was admitted to the ICU and was being followed by critical care medicine.  Subsequently transferred over to medical consultation services on 7/21.    Antibiotics: Anti-infectives (From admission, onward)    Start     Dose/Rate Route Frequency Ordered Stop   04/24/21 1400  vancomycin (VANCOREADY) IVPB 1000 mg/200 mL  Status:  Discontinued        1,000 mg 200 mL/hr over 60 Minutes Intravenous Every 12 hours 04/24/21 0229 04/24/21 1326   04/24/21 0200  vancomycin (VANCOREADY) IVPB 2000 mg/400 mL        2,000 mg 200 mL/hr over 120 Minutes Intravenous  Once 04/24/21 0102 04/24/21 0324   04/24/21 0200  ceFEPIme (MAXIPIME) 2 g in sodium chloride 0.9 % 100 mL IVPB  Status:  Discontinued        2 g 200 mL/hr over 30 Minutes Intravenous Every 8 hours 04/24/21 0102 04/26/21 1208   04/16/21 1800  piperacillin-tazobactam (ZOSYN) IVPB 3.375 g  Status:  Discontinued       Note to Pharmacy: Pharmacy may adjust dosing strength, schedule, rate of infusion, etc as needed to optimize therapy   3.375 g 12.5 mL/hr over 240 Minutes Intravenous Every 8 hours 04/16/21 1640 04/18/21 1014   04/16/21 1730  piperacillin-tazobactam (ZOSYN) IVPB 3.375 g   Status:  Discontinued       Note to Pharmacy: Pharmacy may adjust dosing strength, schedule, rate of infusion, etc as needed to optimize therapy   3.375 g 12.5 mL/hr over 240 Minutes Intravenous Every 8 hours 04/16/21 1640 04/16/21 1640   04/16/21 1000  cefTRIAXone (ROCEPHIN) 2 g in sodium chloride 0.9 % 100 mL IVPB  Status:  Discontinued       Note to Pharmacy: Pharmacy may adjust dosing strength / duration / interval for maximal efficacy   2 g 200 mL/hr over 30 Minutes Intravenous Every 24 hours 04/16/21 0828 04/16/21 1640   04/16/21 1000  metroNIDAZOLE (FLAGYL) IVPB 500 mg  Status:  Discontinued        500 mg 100 mL/hr over 60 Minutes Intravenous Every 6 hours 04/16/21 0828 04/16/21 1640   04/16/21 0100  piperacillin-tazobactam (ZOSYN) IVPB 3.375 g        3.375 g 12.5 mL/hr over 240 Minutes Intravenous  Once 04/16/21 0057 04/16/21 0143       Subjective/Interval History: Patient continues to feel bloated in his abdomen.  Passing some gas but has not had a bowel movement in few days.  Denies any nausea this morning.  Some difficulty breathing is present with occasional dry cough.    Assessment/Plan:  Exudative left pleural effusion with left lower lobe atelectasis Patient was seen by pulmonology in the ICU.  Differential diagnoses include reactive effusion versus hemothorax secondary to splenic infarct.  Patient subsequently underwent thoracentesis on 7/17.  Noted to have exudative pleural effusion by lights criteria.  Cultures negative so far.  Completed course of cefepime on 7/19.  WBC noted to be 26.4 this morning.  Improved from 33.9 yesterday.  Remains afebrile. Respiratory status seems to be stable.  Continue with incentive spirometer.  Mobilize.  Out of bed to chair. Intermittent chest x-rays.  Currently on 2 L of oxygen via nasal cannula.  Large subcapsular splenic hematoma/perisplenic hemorrhage status post IR splenic artery embolization/status post splenectomy Management per  general surgery. Currently on TPN.  Normocytic anemia Hemoglobin noted to be stable over the last few days.  Continue to monitor.  No evidence for any other overt blood loss.  Hyperglycemia Continue to monitor CBGs.  Obesity Estimated body mass index is 33.17 kg/m as calculated from the following:   Height as of this encounter: 5' 11.5" (1.816 m).   Weight as of this encounter: 109.4 kg.  Moderate malnutrition Nutrition Problem: Moderate Malnutrition Etiology: acute illness (recent abdominal surgeries)  Signs/Symptoms: mild fat depletion, mild muscle depletion  Interventions: Boost Breeze, Prostat, TPN   Medications: Scheduled:  acetaminophen  650 mg Oral Q6H   alteplase  2 mg Intracatheter Once   Chlorhexidine Gluconate Cloth  6 each Topical Q0600   enoxaparin (LOVENOX) injection  40 mg Subcutaneous Q24H   gabapentin  300 mg Oral TID   insulin aspart  0-9 Units Subcutaneous Q8H   lip balm  1 application Topical BID   mouth rinse  15 mL Mouth Rinse BID   metoCLOPramide (REGLAN) injection  10 mg Intravenous Q8H   pantoprazole  40 mg Oral BID   polycarbophil  625 mg Oral BID   polyethylene glycol  17 g Oral Daily   sodium phosphate  1 enema Rectal Once   Continuous:  methocarbamol (ROBAXIN) IV     TPN ADULT (ION) Stopped (04/27/21 1723)   TPN ADULT (ION)     QVZ:DGLO & mag hydroxide-simeth, bisacodyl, hydrALAZINE, HYDROmorphone (DILAUDID) injection, magic mouthwash, menthol-cetylpyridinium, methocarbamol (ROBAXIN) IV, metoprolol tartrate, ondansetron **OR** [DISCONTINUED] ondansetron (ZOFRAN) IV, oxyCODONE, oxyCODONE, phenol, prochlorperazine, simethicone   Objective:  Vital Signs  Vitals:   04/28/21 0700 04/28/21 0711 04/28/21 0800 04/28/21 0900  BP:  (!) 156/78 (!) 146/73   Pulse: (!) 102 (!) 103 99 (!) 104  Resp: 17 (!) 24 (!) 22 20  Temp:   98.2 F (36.8 C)   TempSrc:   Oral   SpO2: 98% 96% 97% 98%  Weight:      Height:        Intake/Output Summary  (Last 24 hours) at 04/28/2021 1048 Last data filed at 04/28/2021 0900 Gross per 24 hour  Intake 1018.44 ml  Output 1450 ml  Net -431.56 ml   Filed Weights   04/16/21 0650 04/24/21 0427  Weight: 103.9 kg 109.4 kg    General appearance: Awake alert.  In no distress Resp: Diminished air entry on the left side.  Few crackles.  No wheezing. Cardio: S1-S2 is normal regular.  No S3-S4.  No rubs murmurs or bruit GI: Bowel sounds heard Extremities: No edema.  Full range of motion of lower extremities. Neurologic: Alert and oriented x3.  No focal neurological deficits.    Lab Results:  Data Reviewed: I have personally reviewed following labs and imaging studies  CBC: Recent Labs  Lab 04/25/21 0340 04/25/21 1149 04/25/21 2030 04/26/21 0247 04/27/21 0246 04/27/21 1311 04/28/21 0454  WBC 18.0* 18.9*  --  20.9* 33.9*  --  26.4*  NEUTROABS 13.3*  --   --   --   --   --   --  HGB 7.4* 7.0* 8.2* 8.9* 7.7* 7.8* 7.7*  HCT 24.7* 24.4* 24.0* 29.4* 24.9* 25.2* 25.0*  MCV 89.5 98.8  --  89.9 88.9  --  87.7  PLT 489* 482*  --  431* 480*  --  484*    Basic Metabolic Panel: Recent Labs  Lab 04/24/21 0434 04/25/21 0340 04/25/21 2030 04/26/21 0247 04/27/21 0246 04/28/21 0454  NA 132* 133* 134* 136 137 134*  K 4.3 4.3 4.5 4.7 4.6 4.7  CL 99 100  --  103 101 100  CO2 26 24  --  27 28 26   GLUCOSE 112* 136*  --  152* 138* 122*  BUN 12 15  --  13 14 14   CREATININE 0.66 0.57*  --  0.65 0.59* 0.59*  CALCIUM 8.2* 8.2*  --  8.0* 8.3* 8.1*  MG 2.0 1.9  --  1.8 2.2 2.0  PHOS 3.5 3.5  --  2.8 3.5 4.1    GFR: Estimated Creatinine Clearance: 110.6 mL/min (A) (by C-G formula based on SCr of 0.59 mg/dL (L)).  Liver Function Tests: Recent Labs  Lab 04/22/21 0405 04/23/21 0500 04/24/21 0434 04/25/21 0340 04/28/21 0454  AST 17 28 39 40 282*  ALT 12 19 29  32 191*  ALKPHOS 67 82 98 113 136*  BILITOT 0.6 0.6 0.6 0.7 0.6  PROT 6.7 6.5 6.7 6.8 6.0*  ALBUMIN 2.4* 2.4* 2.4* 2.4* 1.9*       CBG: Recent Labs  Lab 04/27/21 0005 04/27/21 0748 04/27/21 1544 04/27/21 2323 04/28/21 0744  GLUCAP 138* 129* 118* 118* 132*     Recent Results (from the past 240 hour(s))  Body fluid culture w Gram Stain     Status: None   Collection Time: 04/24/21 10:33 AM   Specimen: PATH Cytology Pleural fluid  Result Value Ref Range Status   Specimen Description   Final    PLEURAL Performed at [facility], [address] [place], [place]    Special Requests   Final    LEFT Performed at [facility], [address], [place], [place]    Gram Stain   Final    FEW WBC PRESENT, PREDOMINANTLY PMN NO ORGANISMS SEEN    Culture   Final    NO GROWTH 3 DAYS Performed at [facility], [address] [place], [facility] [address]    Report Status 04/28/2021 FINAL  Final      Radiology Studies: DG CHEST PORT 1 VIEW  Result Date: 04/28/2021 CLINICAL DATA:  Left pleural effusion. EXAM: PORTABLE CHEST 1 VIEW COMPARISON:  Chest x-ray 04/25/2021.  CT 04/23/2021. FINDINGS: PICC line noted stable position. Stable cardiomegaly. Low lung volumes with bibasilar atelectasis. Progressive left lung infiltrate. Persistent left pleural effusion. IMPRESSION: 1.  Right PICC line stable position. 2. Progressive left lung infiltrate. Persistent left-sided pleural effusion. Electronically Signed   By: 04/30/2021  Register   On: 04/28/2021 06:34       LOS: 12 days   04/25/2021  [facility] Pager on [email or web address]  04/28/2021, 10:48 AM

## 2021-04-28 NOTE — Progress Notes (Signed)
Nightshift RN notified IV team of Lumen 1 PICC occlusion and changed cap.   This RN also assessed PICC line and changed cap on Lumen 1 of port and was unable to flush. IV team notified by this RN, and per IV team, they will assess line at bedside this AM. RN will continue to carefully monitor pt.

## 2021-04-28 NOTE — Progress Notes (Signed)
IP rehab admissions - I called and spoke with daughter, Purvis Kilts, about inpatient rehab potentially.  Dtr has asked me to contact her mother, Rosey Bath, who is patient's ex-wife but also his current partner.  I called and left a message with Rosey Bath to call me to discuss rehab options.  Will await call.  Call for questions.  [phone]

## 2021-04-28 NOTE — Progress Notes (Signed)
Pt notified IV team RN and this RN that he has been experiencing intermittent chest pain, varying in severity, for the last 4 days. He also reports that he has notified physicians already.   Pt denies SOB or chest pain at this time, and VS are stable. Dr. Rito Ehrlich notified by this RN; no new orders at this time d/t intermittent nature and that symptoms are likely d/t "pneumonia and effusion". This RN will continue to carefully monitor pt.

## 2021-04-28 NOTE — Progress Notes (Signed)
Progress Note: General Surgery Service   Chief Complaint/Subjective: Worked with therapy yesterday.  Passing flatus, no bowel movements.  No nausea but feels full quickly, mostly just drinking water.  Objective: Vital signs in last 24 hours: Temp:  [97.8 F (36.6 C)-98.7 F (37.1 C)] 98.2 F (36.8 C) (07/21 0800) Pulse Rate:  [98-121] 99 (07/21 0800) Resp:  [10-24] 22 (07/21 0800) BP: (126-183)/(63-81) 146/73 (07/21 0800) SpO2:  [90 %-99 %] 97 % (07/21 0800) Last BM Date: 04/24/21  Intake/Output from previous day: 07/20 0701 - 07/21 0700 In: 1038.5 [I.V.:1038.5] Out: 1200 [Urine:1150; Drains:50] Intake/Output this shift: No intake/output data recorded.  GI: Abd Distended, incisions c/d/I, midline healing well, LUQ subcostal incision with dermabond c/d/i   Lab Results: CBC  Recent Labs    04/27/21 0246 04/27/21 1311 04/28/21 0454  WBC 33.9*  --  26.4*  HGB 7.7* 7.8* 7.7*  HCT 24.9* 25.2* 25.0*  PLT 480*  --  484*    BMET Recent Labs    04/27/21 0246 04/28/21 0454  NA 137 134*  K 4.6 4.7  CL 101 100  CO2 28 26  GLUCOSE 138* 122*  BUN 14 14  CREATININE 0.59* 0.59*  CALCIUM 8.3* 8.1*    PT/INR No results for input(s): LABPROT, INR in the last 72 hours.  ABG Recent Labs    04/25/21 2030  PHART 7.395  HCO3 25.2     Anti-infectives: Anti-infectives (From admission, onward)    Start     Dose/Rate Route Frequency Ordered Stop   04/24/21 1400  vancomycin (VANCOREADY) IVPB 1000 mg/200 mL  Status:  Discontinued        1,000 mg 200 mL/hr over 60 Minutes Intravenous Every 12 hours 04/24/21 0229 04/24/21 1326   04/24/21 0200  vancomycin (VANCOREADY) IVPB 2000 mg/400 mL        2,000 mg 200 mL/hr over 120 Minutes Intravenous  Once 04/24/21 0102 04/24/21 0324   04/24/21 0200  ceFEPIme (MAXIPIME) 2 g in sodium chloride 0.9 % 100 mL IVPB  Status:  Discontinued        2 g 200 mL/hr over 30 Minutes Intravenous Every 8 hours 04/24/21 0102 04/26/21 1208    04/16/21 1800  piperacillin-tazobactam (ZOSYN) IVPB 3.375 g  Status:  Discontinued       Note to Pharmacy: Pharmacy may adjust dosing strength, schedule, rate of infusion, etc as needed to optimize therapy   3.375 g 12.5 mL/hr over 240 Minutes Intravenous Every 8 hours 04/16/21 1640 04/18/21 1014   04/16/21 1730  piperacillin-tazobactam (ZOSYN) IVPB 3.375 g  Status:  Discontinued       Note to Pharmacy: Pharmacy may adjust dosing strength, schedule, rate of infusion, etc as needed to optimize therapy   3.375 g 12.5 mL/hr over 240 Minutes Intravenous Every 8 hours 04/16/21 1640 04/16/21 1640   04/16/21 1000  cefTRIAXone (ROCEPHIN) 2 g in sodium chloride 0.9 % 100 mL IVPB  Status:  Discontinued       Note to Pharmacy: Pharmacy may adjust dosing strength / duration / interval for maximal efficacy   2 g 200 mL/hr over 30 Minutes Intravenous Every 24 hours 04/16/21 0828 04/16/21 1640   04/16/21 1000  metroNIDAZOLE (FLAGYL) IVPB 500 mg  Status:  Discontinued        500 mg 100 mL/hr over 60 Minutes Intravenous Every 6 hours 04/16/21 0828 04/16/21 1640   04/16/21 0100  piperacillin-tazobactam (ZOSYN) IVPB 3.375 g        3.375 g 12.5 mL/hr  over 240 Minutes Intravenous  Once 04/16/21 0057 04/16/21 0143       Medications: Scheduled Meds:  Chlorhexidine Gluconate Cloth  6 each Topical Q0600   enoxaparin (LOVENOX) injection  40 mg Subcutaneous Q24H   gabapentin  300 mg Oral TID   insulin aspart  0-9 Units Subcutaneous Q8H   lip balm  1 application Topical BID   mouth rinse  15 mL Mouth Rinse BID   metoCLOPramide (REGLAN) injection  10 mg Intravenous Q8H   pantoprazole  40 mg Oral BID   polycarbophil  625 mg Oral BID   polyethylene glycol  17 g Oral Daily   sodium phosphate  1 enema Rectal Once   Continuous Infusions:  methocarbamol (ROBAXIN) IV     TPN ADULT (ION) Stopped (04/27/21 1723)   PRN Meds:.acetaminophen, alum & mag hydroxide-simeth, bisacodyl, hydrALAZINE, HYDROmorphone  (DILAUDID) injection, magic mouthwash, menthol-cetylpyridinium, methocarbamol (ROBAXIN) IV, metoprolol tartrate, ondansetron **OR** [DISCONTINUED] ondansetron (ZOFRAN) IV, oxyCODONE, oxyCODONE, phenol, prochlorperazine, simethicone  Assessment/Plan: Brian Atkinson is a 70 year old male who underwent recurrent incisional hernia repair with removal of previous intraperitoneal mesh and rives stoppa style repair.  His posterior layer broke down where the previous mesh had been removed requiring takeback to the operating room for diagnostic laparotomy converted to exploratory laparotomy, small bowel resection with vicryl mesh reconstruction of the posterior layer and phasix retromuscular mesh placement on 04/04/2021. He recovered from this in the hospital, with a prolonged postoperative ileus which was not unexpected due to the large interparietal hernia.  A CT guided drain was placed on 04/13/21, to drain a pelvic fluid collection.  He was discharged on 04/15/21.  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  On 04/16/21, he underwent coil embolization of the splenic artery.  He failed to improve symptomatically after embolization and was taken for open splenectomy on 04/25/21.    Leukocytosis likely 2/2 splenectomy, monitor for other signs of infection Continue full liquid diet today with supplements, not taking much Increase activity - encouraged patient to work with physical therapy and occupational therapy - looks like he would benefit from inpatient rehab at discharge Incentive spirometer Monitor JP drain output Acute blood loss anemia, monitor hgb and transfuse if needed Continue PICC, TPN    LOS: 12 days   FEN: FLD, TPN till he has a bowel movement/tolerates sufficient PO caloric intake ID: None per surgery VTE: SCDs, lovenox Foley: Removed Dispo: Continued care in stepdown    Quentin Ore, MD  [facility] Use [email or web address] to contact on call provider
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
## 2021-04-28 NOTE — Progress Notes (Signed)
Patient's wife voiced concerns to this RN regarding patient's increased abdominal distention and edema in extremities. Dr. Dossie Der notified by this RN and 20 mg IV lasix ordered. This RN will continue to carefully monitor pt.

## 2021-04-28 NOTE — Progress Notes (Addendum)
VAST to bedside to attempt declotting of red lumen of RA DL PICC utilizing push/pull method with alteplase. While preparing medication, patient verbalized chest pain of stabbing nature, 8-9/10, which sometimes changes with breathing, but not this time and lasts less than a minute at a time. He verbalized he has been having intermittent pain of this nature the last 4-5 days and has reported it to medical staff. Reported findings to unit RN.  1151: able to instill full dose of alteplase over 10 minute period; to dwell for at least 2 hours.  1530: unable to withdraw full amount of alteplase or obtain blood return; line to dwell another hour; unit RN notified  1620: alteplase removed and blood return obtained and wasted; unit RN notified of results

## 2021-04-28 NOTE — Progress Notes (Signed)
PHARMACY - TOTAL PARENTERAL NUTRITION CONSULT NOTE   Indication: Prolonged ileus  Patient Measurements: Height: 5' 11.5" (181.6 cm) Weight: 109.4 kg (241 lb 2.9 oz) IBW/kg (Calculated) : 76.45   Body mass index is 33.17 kg/m.  Recent Labs    04/27/21 0246 04/28/21 0454  NA 137 134*  K 4.6 4.7  CL 101 100  CO2 28 26  GLUCOSE 138* 122*  BUN 14 14  CREATININE 0.59* 0.59*  CALCIUM 8.3* 8.1*  PHOS 3.5 4.1  MG 2.2 2.0  ALBUMIN  --  1.9*  ALKPHOS  --  136*  AST  --  282*  ALT  --  191*  BILITOT  --  0.6    ssessment:  70 yo male s/p ventral hernia repair with mesh on 6/20. 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 He had a postoperative ileus for which he was on TPN. He also developed a pelvic abscess which was drained by IR on 7/6. His surgical retrorectus drain was removed on 7/7. Prior to discharge he was tolerating a diet and TPN was stopped. He was discharged home 7/8.  He was readmitted 7/9 with hypotension, AKI and leukocytosis.  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  On 04/16/21, he underwent coil embolization of the splenic artery.  He has not been tolerating diet, so Pharmacy has been consulted to start TPN.  Glucose / Insulin: No hx DM - CBGs 118 - 132  - sensitive SSI:  3 units / 24h Electrolytes: WNL. Na 134, K 4.7 (goal >4), Mag 2.0 (goal >2), CorrCa  9.7 Renal: BUN, SCr stable Hepatic: LFTs increased, Bili wnl: likely due to TPN. 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  Central access: PICC 7/13 TPN start date: 7/13  Nutritional Goals (updated RD recommendation on 7/13): kCal: 2250-2350, Protein: 115-130, Fluid: 2.4L/day Updated goals 7/20 = kCal 2250 - 2500, Protein 125-140 gm Goal TPN rate 100 mL/hr (provides 120 g of protein and 2347 kcals per day)  Current Nutrition:  Full liquids and TPN 7/19 Pt removed NG tube on 7/19  Plan:  Continue TPN at 100 mL/hr Electrolytes in TPN:  Na 152mEq/L  K reduced to 30 mEq/L Ca 10 mEq/L  Mg 7 mEq/L  Phos reduced to 73mmol/L  Cl:Ac 1:1 Add standard MVI and trace elements to TPN Continue Sensitive SSI decreased to q8h and adjust as needed  IVF per MD - none currently Monitor TPN labs on Mon/Thurs CMET, Mag, Phos in am 7/21  Perlie Gold, PharmD [facility] main pharmacy [phone] 04/28/2021 7:02 AM

## 2021-04-29 LAB — TYPE AND SCREEN
ABO/RH(D): O POS
Antibody Screen: NEGATIVE
Unit division: 0
Unit division: 0
Unit division: 0
Unit division: 0

## 2021-04-29 LAB — CBC
HCT: 23.4 % — ABNORMAL LOW (ref 39.0–52.0)
Hemoglobin: 7.2 g/dL — ABNORMAL LOW (ref 13.0–17.0)
MCH: 27.5 pg (ref 26.0–34.0)
MCHC: 30.8 g/dL (ref 30.0–36.0)
MCV: 89.3 fL (ref 80.0–100.0)
Platelets: 647 10*3/uL — ABNORMAL HIGH (ref 150–400)
RBC: 2.62 MIL/uL — ABNORMAL LOW (ref 4.22–5.81)
RDW: 16.6 % — ABNORMAL HIGH (ref 11.5–15.5)
WBC: 22.1 10*3/uL — ABNORMAL HIGH (ref 4.0–10.5)
nRBC: 3.2 % — ABNORMAL HIGH (ref 0.0–0.2)

## 2021-04-29 LAB — COMPREHENSIVE METABOLIC PANEL
ALT: 167 U/L — ABNORMAL HIGH (ref 0–44)
AST: 143 U/L — ABNORMAL HIGH (ref 15–41)
Albumin: 1.9 g/dL — ABNORMAL LOW (ref 3.5–5.0)
Alkaline Phosphatase: 137 U/L — ABNORMAL HIGH (ref 38–126)
Anion gap: 6 (ref 5–15)
BUN: 11 mg/dL (ref 8–23)
CO2: 28 mmol/L (ref 22–32)
Calcium: 8.1 mg/dL — ABNORMAL LOW (ref 8.9–10.3)
Chloride: 102 mmol/L (ref 98–111)
Creatinine, Ser: 0.5 mg/dL — ABNORMAL LOW (ref 0.61–1.24)
GFR, Estimated: >60 mL/min (ref >60)
Glucose, Bld: 125 mg/dL — ABNORMAL HIGH (ref 70–99)
Potassium: 3.9 mmol/L (ref 3.5–5.1)
Sodium: 136 mmol/L (ref 135–145)
Total Bilirubin: 0.5 mg/dL (ref 0.3–1.2)
Total Protein: 6.1 g/dL — ABNORMAL LOW (ref 6.5–8.1)

## 2021-04-29 LAB — BPAM RBC
Blood Product Expiration Date: 202208202359
Blood Product Expiration Date: 202208202359
Blood Product Expiration Date: 202208202359
Blood Product Expiration Date: 202208202359
ISSUE DATE / TIME: 202207181841
ISSUE DATE / TIME: 202207181841
Unit Type and Rh: 5100
Unit Type and Rh: 5100
Unit Type and Rh: 5100
Unit Type and Rh: 5100

## 2021-04-29 LAB — GLUCOSE, CAPILLARY
Glucose-Capillary: 120 mg/dL — ABNORMAL HIGH (ref 70–99)
Glucose-Capillary: 116 mg/dL — ABNORMAL HIGH (ref 70–99)
Glucose-Capillary: 92 mg/dL (ref 70–99)

## 2021-04-29 LAB — MAGNESIUM: Magnesium: 2 mg/dL (ref 1.7–2.4)

## 2021-04-29 LAB — PHOSPHORUS: Phosphorus: 4 mg/dL (ref 2.5–4.6)

## 2021-04-29 MED ORDER — HAEMOPHILUS B POLYSAC CONJ VAC IM SOLR
0.5000 mL | Freq: Once | INTRAMUSCULAR | Status: DC
Start: 2021-05-08 — End: 2021-05-01

## 2021-04-29 MED ORDER — PNEUMOCOCCAL 20-VAL CONJ VACC 0.5 ML IM SUSY: 0.5000 mL | PREFILLED_SYRINGE | Freq: Once | INTRAMUSCULAR | Status: DC

## 2021-04-29 MED ORDER — PSEUDOEPHEDRINE HCL 30 MG PO TABS
30.0000 mg | ORAL_TABLET | Freq: Once | ORAL | Status: AC
  Administered 2021-04-29: 30 mg via ORAL
  Filled 2021-04-29: qty 1

## 2021-04-29 MED ORDER — FUROSEMIDE 10 MG/ML IJ SOLN
20.0000 mg | Freq: Once | INTRAMUSCULAR | Status: AC
  Administered 2021-04-29: 20 mg via INTRAVENOUS
  Filled 2021-04-29: qty 2

## 2021-04-29 MED ORDER — MENINGOCOCCAL VAC B (OMV) IM SUSY: 0.5000 mL | PREFILLED_SYRINGE | Freq: Once | INTRAMUSCULAR | Status: DC

## 2021-04-29 MED ORDER — MENINGOCOCCAL A C Y&W-135 OLIG IM SOLR: 0.5000 mL | Freq: Once | INTRAMUSCULAR | Status: DC

## 2021-04-29 NOTE — Progress Notes (Signed)
[facility] PROGRESS NOTE   Daily Crate HWT:[id number] DOB: April 07, 1951 DOA: 04/15/2021  PCP: Shelly Rubenstein, MD  Brief History/Interval Summary: 70 year old never smoker with PMHx significant for osteomyelitis of the patella, MRSA infection and incisional hernia (s/p initial repair 6/20) with complicated postoperative course requiring exploratory laparotomy and small bowel resection with mesh reconstruction.   Readmitted 7/8 with ruptured spleen prompting coil embolization of splenic artery by IR 7/9. Developed fever and new abdominal pain 7/18 with persistent leukocytosis. 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  Patient was admitted to the ICU and was being followed by critical care medicine.  Subsequently transferred over to medical consultation services on 7/21.    Antibiotics: Anti-infectives (From admission, onward)    Start     Dose/Rate Route Frequency Ordered Stop   04/24/21 1400  vancomycin (VANCOREADY) IVPB 1000 mg/200 mL  Status:  Discontinued        1,000 mg 200 mL/hr over 60 Minutes Intravenous Every 12 hours 04/24/21 0229 04/24/21 1326   04/24/21 0200  vancomycin (VANCOREADY) IVPB 2000 mg/400 mL        2,000 mg 200 mL/hr over 120 Minutes Intravenous  Once 04/24/21 0102 04/24/21 0324   04/24/21 0200  ceFEPIme (MAXIPIME) 2 g in sodium chloride 0.9 % 100 mL IVPB  Status:  Discontinued        2 g 200 mL/hr over 30 Minutes Intravenous Every 8 hours 04/24/21 0102 04/26/21 1208   04/16/21 1800  piperacillin-tazobactam (ZOSYN) IVPB 3.375 g  Status:  Discontinued       Note to Pharmacy: Pharmacy may adjust dosing strength, schedule, rate of infusion, etc as needed to optimize therapy   3.375 g 12.5 mL/hr over 240 Minutes Intravenous Every 8 hours 04/16/21 1640 04/18/21 1014   04/16/21 1730  piperacillin-tazobactam (ZOSYN) IVPB 3.375 g   Status:  Discontinued       Note to Pharmacy: Pharmacy may adjust dosing strength, schedule, rate of infusion, etc as needed to optimize therapy   3.375 g 12.5 mL/hr over 240 Minutes Intravenous Every 8 hours 04/16/21 1640 04/16/21 1640   04/16/21 1000  cefTRIAXone (ROCEPHIN) 2 g in sodium chloride 0.9 % 100 mL IVPB  Status:  Discontinued       Note to Pharmacy: Pharmacy may adjust dosing strength / duration / interval for maximal efficacy   2 g 200 mL/hr over 30 Minutes Intravenous Every 24 hours 04/16/21 0828 04/16/21 1640   04/16/21 1000  metroNIDAZOLE (FLAGYL) IVPB 500 mg  Status:  Discontinued        500 mg 100 mL/hr over 60 Minutes Intravenous Every 6 hours 04/16/21 0828 04/16/21 1640   04/16/21 0100  piperacillin-tazobactam (ZOSYN) IVPB 3.375 g        3.375 g 12.5 mL/hr over 240 Minutes Intravenous  Once 04/16/21 0057 04/16/21 0143       Subjective/Interval History: Patient continues to have discomfort in his abdomen.  Has been passing gas from below.  Denies any shortness of breath or chest pain currently.  Reported intermittent lower chest pain over the last few days which he attributes to gas.      Assessment/Plan:  Exudative left pleural effusion with left lower lobe atelectasis Patient was seen by pulmonology in the ICU.  Differential diagnoses include reactive effusion versus hemothorax secondary to splenic infarct.  Patient subsequently underwent thoracentesis on 7/17.  Noted to have exudative  pleural effusion by lights criteria.  Cultures negative so far.  Completed course of cefepime on 7/19.  Remains afebrile.  WBC down to 22.1 this morning.   Respiratory status is stable.  Was given furosemide yesterday.  Currently noted to be off of oxygen.  Continue incentive spirometry.  Continue mobilization.    Large subcapsular splenic hematoma/perisplenic hemorrhage status post IR splenic artery embolization/status post splenectomy/abnormal LFTs Management per general surgery.  Currently on TPN.  Looks like patient being advanced to regular diet with plans to discontinue TPN sometime today.  Normocytic anemia Hemoglobin stable for the most part.  Continue to monitor.  No evidence of overt bleeding.    Hyperglycemia Continue to monitor CBGs.  Seems to be stable.  Possibly due to TPN.  Obesity Estimated body mass index is 33.17 kg/m as calculated from the following:   Height as of this encounter: 5' 11.5" (1.816 m).   Weight as of this encounter: 109.4 kg.  Moderate malnutrition Nutrition Problem: Moderate Malnutrition Etiology: acute illness (recent abdominal surgeries)  Signs/Symptoms: mild fat depletion, mild muscle depletion  Interventions: Boost Breeze, Prostat, TPN   Medications: Scheduled:  acetaminophen  650 mg Oral Q6H   Chlorhexidine Gluconate Cloth  6 each Topical Q0600   enoxaparin (LOVENOX) injection  40 mg Subcutaneous Q24H   gabapentin  300 mg Oral TID   insulin aspart  0-9 Units Subcutaneous Q8H   lip balm  1 application Topical BID   mouth rinse  15 mL Mouth Rinse BID   metoCLOPramide (REGLAN) injection  10 mg Intravenous Q8H   pantoprazole  40 mg Oral BID   polycarbophil  625 mg Oral BID   polyethylene glycol  17 g Oral Daily   sodium phosphate  1 enema Rectal Once   Continuous:  methocarbamol (ROBAXIN) IV     TPN ADULT (ION) 100 mL/hr at 04/29/21 0500   VOZ:DGUY & mag hydroxide-simeth, bisacodyl, hydrALAZINE, HYDROmorphone (DILAUDID) injection, magic mouthwash, menthol-cetylpyridinium, methocarbamol (ROBAXIN) IV, metoprolol tartrate, ondansetron **OR** [DISCONTINUED] ondansetron (ZOFRAN) IV, oxyCODONE, oxyCODONE, phenol, prochlorperazine, simethicone   Objective:  Vital Signs  Vitals:   04/29/21 0500 04/29/21 0700 04/29/21 0800 04/29/21 0900  BP:   (!) 141/65   Pulse: 91 91 90 97  Resp: 15 17  19   Temp:   98.6 F (37 C)   TempSrc:   Oral   SpO2: 96% 94% 95% 98%  Weight:      Height:        Intake/Output Summary  (Last 24 hours) at 04/29/2021 0943 Last data filed at 04/29/2021 0500 Gross per 24 hour  Intake 1211.07 ml  Output 1725 ml  Net -513.93 ml    Filed Weights   04/16/21 0650 04/24/21 0427  Weight: 103.9 kg 109.4 kg    General appearance: Awake alert.  In no distress Resp: Improved effort.  Improved air entry at the bases.  No wheezing.  No rhonchi.  Few crackles Cardio: S1-S2 is normal regular.  No S3-S4.  No rubs murmurs or bruit GI: Bowel sounds heard. Extremities: No edema.  Moving all of his extremities Neurologic: Alert and oriented x3.  No focal neurological deficits.     Lab Results:  Data Reviewed: I have personally reviewed following labs and imaging studies  CBC: Recent Labs  Lab 04/25/21 0340 04/25/21 1149 04/25/21 2030 04/26/21 0247 04/27/21 0246 04/27/21 1311 04/28/21 0454 04/29/21 0526  WBC 18.0* 18.9*  --  20.9* 33.9*  --  26.4* 22.1*  NEUTROABS 13.3*  --   --   --   --   --   --   --  HGB 7.4* 7.0*   < > 8.9* 7.7* 7.8* 7.7* 7.2*  HCT 24.7* 24.4*   < > 29.4* 24.9* 25.2* 25.0* 23.4*  MCV 89.5 98.8  --  89.9 88.9  --  87.7 89.3  PLT 489* 482*  --  431* 480*  --  484* 647*   < > = values in this interval not displayed.     Basic Metabolic Panel: Recent Labs  Lab 04/25/21 0340 04/25/21 2030 04/26/21 0247 04/27/21 0246 04/28/21 0454 04/29/21 0526  NA 133* 134* 136 137 134* 136  K 4.3 4.5 4.7 4.6 4.7 3.9  CL 100  --  103 101 100 102  CO2 24  --  27 28 26 28   GLUCOSE 136*  --  152* 138* 122* 125*  BUN 15  --  13 14 14 11   CREATININE 0.57*  --  0.65 0.59* 0.59* 0.50*  CALCIUM 8.2*  --  8.0* 8.3* 8.1* 8.1*  MG 1.9  --  1.8 2.2 2.0 2.0  PHOS 3.5  --  2.8 3.5 4.1 4.0     GFR: Estimated Creatinine Clearance: 110.6 mL/min (A) (by C-G formula based on SCr of 0.5 mg/dL (L)).  Liver Function Tests: Recent Labs  Lab 04/23/21 0500 04/24/21 0434 04/25/21 0340 04/28/21 0454 04/29/21 0526  AST 28 39 40 282* 143*  ALT 19 29 32 191* 167*  ALKPHOS  82 98 113 136* 137*  BILITOT 0.6 0.6 0.7 0.6 0.5  PROT 6.5 6.7 6.8 6.0* 6.1*  ALBUMIN 2.4* 2.4* 2.4* 1.9* 1.9*       CBG: Recent Labs  Lab 04/27/21 2323 04/28/21 0744 04/28/21 1520 04/28/21 2318 04/29/21 0737  GLUCAP 118* 132* 117* 126* 120*      Recent Results (from the past 240 hour(s))  Body fluid culture w Gram Stain     Status: None   Collection Time: 04/24/21 10:33 AM   Specimen: PATH Cytology Pleural fluid  Result Value Ref Range Status   Specimen Description   Final    PLEURAL Performed at [facility], [address] [place], [place]    Special Requests   Final    LEFT Performed at [facility], [address], [place], [place]    Gram Stain   Final    FEW WBC PRESENT, PREDOMINANTLY PMN NO ORGANISMS SEEN    Culture   Final    NO GROWTH 3 DAYS Performed at [facility], [address] [place], [address] [place]    Report Status 04/28/2021 FINAL  Final       Radiology Studies: DG CHEST PORT 1 VIEW  Result Date: 04/28/2021 CLINICAL DATA:  Left pleural effusion. EXAM: PORTABLE CHEST 1 VIEW COMPARISON:  Chest x-ray 04/25/2021.  CT 04/23/2021. FINDINGS: PICC line noted stable position. Stable cardiomegaly. Low lung volumes with bibasilar atelectasis. Progressive left lung infiltrate. Persistent left pleural effusion. IMPRESSION: 1.  Right PICC line stable position. 2. Progressive left lung infiltrate. Persistent left-sided pleural effusion. Electronically Signed   By: 04/27/2021  Register   On: 04/28/2021 06:34       LOS: 13 days   Maisie Fus  [facility] Pager on [email or web address]  04/29/2021, 9:43 AM

## 2021-04-29 NOTE — Progress Notes (Signed)
Inpatient Rehab Admissions Coordinator:   I spoke with pt. And he states that he would prefer to go home but will consider CIR if he does not feel he can go home safely once the doctor says he's medically ready for discharge. He reports that his son can provide 24/7 support. I will touch base with pt. On Monday and see how he feels about CIR vs home.   Megan Salon, MS, CCC-SLP Rehab Admissions Coordinator  [phone] (celll) [phone] (office)

## 2021-04-29 NOTE — Progress Notes (Signed)
Progress Note: General Surgery Service   Chief Complaint/Subjective: Large urine output.  Multiple bowel movements.    Objective: Vital signs in last 24 hours: Temp:  [98.2 F (36.8 C)-99.1 F (37.3 C)] 98.8 F (37.1 C) (07/22 0400) Pulse Rate:  [90-116] 91 (07/22 0500) Resp:  [12-28] 15 (07/22 0500) BP: (116-168)/(63-76) 168/71 (07/22 0400) SpO2:  [92 %-100 %] 96 % (07/22 0500) Last BM Date: 04/28/21  Intake/Output from previous day: 07/21 0701 - 07/22 0700 In: 1311.1 [P.O.:300; I.V.:1001.1] Out: 1975 [Urine:1950; Drains:25] Intake/Output this shift: No intake/output data recorded.  GI: Abd less distended, incisions c/d/I, midline healing well, LUQ subcostal incision with dermabond c/d/i   Lab Results: CBC  Recent Labs    04/28/21 0454 04/29/21 0526  WBC 26.4* 22.1*  HGB 7.7* 7.2*  HCT 25.0* 23.4*  PLT 484* 647*    BMET Recent Labs    04/28/21 0454 04/29/21 0526  NA 134* 136  K 4.7 3.9  CL 100 102  CO2 26 28  GLUCOSE 122* 125*  BUN 14 11  CREATININE 0.59* 0.50*  CALCIUM 8.1* 8.1*    PT/INR No results for input(s): LABPROT, INR in the last 72 hours.  ABG No results for input(s): PHART, HCO3 in the last 72 hours.  Invalid input(s): PCO2, PO2   Anti-infectives: Anti-infectives (From admission, onward)    Start     Dose/Rate Route Frequency Ordered Stop   04/24/21 1400  vancomycin (VANCOREADY) IVPB 1000 mg/200 mL  Status:  Discontinued        1,000 mg 200 mL/hr over 60 Minutes Intravenous Every 12 hours 04/24/21 0229 04/24/21 1326   04/24/21 0200  vancomycin (VANCOREADY) IVPB 2000 mg/400 mL        2,000 mg 200 mL/hr over 120 Minutes Intravenous  Once 04/24/21 0102 04/24/21 0324   04/24/21 0200  ceFEPIme (MAXIPIME) 2 g in sodium chloride 0.9 % 100 mL IVPB  Status:  Discontinued        2 g 200 mL/hr over 30 Minutes Intravenous Every 8 hours 04/24/21 0102 04/26/21 1208   04/16/21 1800  piperacillin-tazobactam (ZOSYN) IVPB 3.375 g  Status:   Discontinued       Note to Pharmacy: Pharmacy may adjust dosing strength, schedule, rate of infusion, etc as needed to optimize therapy   3.375 g 12.5 mL/hr over 240 Minutes Intravenous Every 8 hours 04/16/21 1640 04/18/21 1014   04/16/21 1730  piperacillin-tazobactam (ZOSYN) IVPB 3.375 g  Status:  Discontinued       Note to Pharmacy: Pharmacy may adjust dosing strength, schedule, rate of infusion, etc as needed to optimize therapy   3.375 g 12.5 mL/hr over 240 Minutes Intravenous Every 8 hours 04/16/21 1640 04/16/21 1640   04/16/21 1000  cefTRIAXone (ROCEPHIN) 2 g in sodium chloride 0.9 % 100 mL IVPB  Status:  Discontinued       Note to Pharmacy: Pharmacy may adjust dosing strength / duration / interval for maximal efficacy   2 g 200 mL/hr over 30 Minutes Intravenous Every 24 hours 04/16/21 0828 04/16/21 1640   04/16/21 1000  metroNIDAZOLE (FLAGYL) IVPB 500 mg  Status:  Discontinued        500 mg 100 mL/hr over 60 Minutes Intravenous Every 6 hours 04/16/21 0828 04/16/21 1640   04/16/21 0100  piperacillin-tazobactam (ZOSYN) IVPB 3.375 g        3.375 g 12.5 mL/hr over 240 Minutes Intravenous  Once 04/16/21 0057 04/16/21 0143       Medications: Scheduled Meds:  acetaminophen  650 mg Oral Q6H   Chlorhexidine Gluconate Cloth  6 each Topical Q0600   enoxaparin (LOVENOX) injection  40 mg Subcutaneous Q24H   furosemide  20 mg Intravenous Once   gabapentin  300 mg Oral TID   insulin aspart  0-9 Units Subcutaneous Q8H   lip balm  1 application Topical BID   mouth rinse  15 mL Mouth Rinse BID   metoCLOPramide (REGLAN) injection  10 mg Intravenous Q8H   pantoprazole  40 mg Oral BID   polycarbophil  625 mg Oral BID   polyethylene glycol  17 g Oral Daily   sodium phosphate  1 enema Rectal Once   Continuous Infusions:  methocarbamol (ROBAXIN) IV     TPN ADULT (ION) 100 mL/hr at 04/29/21 0500   PRN Meds:.alum & mag hydroxide-simeth, bisacodyl, hydrALAZINE, HYDROmorphone (DILAUDID)  injection, magic mouthwash, menthol-cetylpyridinium, methocarbamol (ROBAXIN) IV, metoprolol tartrate, ondansetron **OR** [DISCONTINUED] ondansetron (ZOFRAN) IV, oxyCODONE, oxyCODONE, phenol, prochlorperazine, simethicone  Assessment/Plan: Brian Atkinson is a 70 year old male who underwent recurrent incisional hernia repair with removal of previous intraperitoneal mesh and rives stoppa style repair.  His posterior layer broke down where the previous mesh had been removed requiring takeback to the operating room for diagnostic laparotomy converted to exploratory laparotomy, small bowel resection with vicryl mesh reconstruction of the posterior layer and phasix retromuscular mesh placement on 04/04/2021. He recovered from this in the hospital, with a prolonged postoperative ileus which was not unexpected due to the large interparietal hernia.  A CT guided drain was placed on 04/13/21, to drain a pelvic fluid collection.  He was discharged on 04/15/21.  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  On 04/16/21, he underwent coil embolization of the splenic artery.  He failed to improve symptomatically after embolization and was taken for open splenectomy on 04/25/21.    Leukocytosis likely 2/2 splenectomy, monitor for other signs of infection Regular diet, discontinue TPN Increase activity - encouraged patient to work with physical therapy and occupational therapy - looks like he would benefit from inpatient rehab at discharge - looks like he may be ready for discharge on Monday Incentive spirometer Monitor JP drain output - check JP for lipase Acute blood loss anemia, monitor hgb and transfuse if needed 20 IV lasix again today, good response yesterday    LOS: 13 days   FEN: Regular diet, d/c TPN ID: None per surgery VTE: SCDs, lovenox Foley: Removed Dispo: Transfer to floor    Quentin Ore, MD  [facility] Use [email or web address] to contact on call provider

## 2021-04-29 NOTE — Progress Notes (Signed)
Occupational Therapy Treatment Patient Details Name: Brian Atkinson MRN: [id number] DOB: November 16, 1950 Today's Date: 04/29/2021    History of present illness 70 year old never smoker with PMHx significant for osteomyelitis of the patella, MRSA infection and incisional hernia (s/p initial repair 6/20) with complicated postoperative course requiring exploratory laparotomy and small bowel resection with mesh reconstruction.     Readmitted 7/8 with ruptured spleen prompting coil embolization of splenic artery by IR 7/9. Developed fever and new abdominal pain 7/18 with persistent leukocytosis. 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   OT comments  Treatment focused on instructing patient on how to perform LB ADLs with AE. Patient familiar with compensatory strategies from prior injury and has reachers, sock aid and shoe horn at home. Patient tolerated more activity today and with less assistance. Cont POC.   Follow Up Recommendations  CIR    Equipment Recommendations  Tub/shower seat    Recommendations for Other Services      Precautions / Restrictions Precautions Precautions: Fall Precaution Comments: L drain; abd surgery Restrictions Weight Bearing Restrictions: No       Mobility Bed Mobility Overal bed mobility: Needs Assistance Bed Mobility: Supine to Sit     Supine to sit: Min guard     General bed mobility comments: min guard and use of bed rail to transfer to side of bed.    Transfers Overall transfer level: Needs assistance Equipment used: Rolling walker (2 wheeled) Transfers: Sit to/from [name] Sit to Stand: Min guard Stand pivot transfers: Min guard       General transfer comment: Min guard to transfer to recliner.    Balance Overall balance assessment: Needs assistance Sitting-balance support: No upper extremity  supported Sitting balance-Leahy Scale: Good     Standing balance support: During functional activity Standing balance-Leahy Scale: Fair Standing balance comment: able to take hands off of walker for clothing management                           ADL either performed or assessed with clinical judgement   ADL                         Lower Body Dressing Details (indicate cue type and reason): Patient shown how to use LHR and sock aide. Patient demonstrated use to don pseudo pants and don and doff socks. Patient reports having AE at home including reacher, sock aide, and shoe horn.                     Vision Patient Visual Report: No change from baseline     Perception     Praxis      Cognition Arousal/Alertness: Awake/alert Behavior During Therapy: WFL for tasks assessed/performed Overall Cognitive Status: Within Functional Limits for tasks assessed                                          Exercises     Shoulder Instructions       General Comments      Pertinent Vitals/ Pain       Pain Assessment: Faces Pain Score: 5  Faces Pain Scale: Hurts a little bit Pain Location: incision site Pain Descriptors / Indicators: Discomfort;Sore;Tightness Pain  Intervention(s): Monitored during session  Home Living                                          Prior Functioning/Environment              Frequency  Min 2X/week        Progress Toward Goals  OT Goals(current goals can now be found in the care plan section)  Progress towards OT goals: Progressing toward goals  Acute Rehab OT Goals Patient Stated Goal: to be independent OT Goal Formulation: With patient Time For Goal Achievement: 05/11/21 Potential to Achieve Goals: Good  Plan Discharge plan remains appropriate    Co-evaluation          OT goals addressed during session: ADL's and self-care      AM-PAC OT "6 Clicks" Daily Activity      Outcome Measure   Help from another person eating meals?: None Help from another person taking care of personal grooming?: A Little Help from another person toileting, which includes using toliet, bedpan, or urinal?: A Lot Help from another person bathing (including washing, rinsing, drying)?: A Little Help from another person to put on and taking off regular upper body clothing?: A Little Help from another person to put on and taking off regular lower body clothing?: A Little 6 Click Score: 18    End of Session Equipment Utilized During Treatment: Rolling walker  OT Visit Diagnosis: Unsteadiness on feet (R26.81);Other abnormalities of gait and mobility (R26.89);Pain   Activity Tolerance Patient tolerated treatment well   Patient Left in chair;with call bell/phone within reach   Nurse Communication Mobility status        Time: 0998-3382 OT Time Calculation (min): 25 min  Charges: OT General Charges $OT Visit: 1 Visit OT Treatments $Self Care/Home Management : 23-37 mins  Keiondra Brookover, OTR/L Acute Care Rehab Services  Office [phone] Pager: [phone]    Kelli Churn 04/29/2021, 3:51 PM

## 2021-04-29 NOTE — Progress Notes (Addendum)
PHARMACY - TOTAL PARENTERAL NUTRITION CONSULT NOTE   Indication: Prolonged ileus  Assessment:  Now on regular diet with orders from surgery to discontinue TPN  Plan: At 1600 today, taper TPN to 1/2 of current rate for 2 hours then stop Discontinue associated TPN labs  Aasir Daigler P. Casimiro Needle, PharmD, BCPS Clinical Pharmacist [place] Please utilize Amion for appropriate phone number to reach the unit pharmacist [facility]) 04/29/2021 8:55 AM

## 2021-04-29 NOTE — Progress Notes (Signed)
Inpatient Rehab Admissions Coordinator:   Pt. Remains on TPN  with elevated WBCs. Pt. Is not medically stable for transfer to CIR at this time. I will continue to follow for potential admit pending medical readiness and bed availability.   Megan Salon, MS, CCC-SLP Rehab Admissions Coordinator  [phone] (celll) [phone] (office)

## 2021-04-29 NOTE — Progress Notes (Signed)
Physical Therapy Treatment Patient Details Name: Brian Atkinson MRN: [id number] DOB: July 01, 1951 Today's Date: 04/29/2021    History of Present Illness 70 year old never smoker with PMHx significant for osteomyelitis of the patella, MRSA infection and incisional hernia (s/p initial repair 6/20) with complicated postoperative course requiring exploratory laparotomy and small bowel resection with mesh reconstruction.     Readmitted 7/8 with ruptured spleen prompting coil embolization of splenic artery by IR 7/9. Developed fever and new abdominal pain 7/18 with persistent leukocytosis. 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    PT Comments    A&O x3. Pt OOB in recliner and motivated to ambulate. Assisted with ambulation in hallway. General gait comments: tolerated an increased distance with avg HR 115 and RA 97%. Slow but steady using walker for balance. Cues needed to keep trunk upright. Some c/o of SOB due to pain with inspiration from surgical sites. Recliner followed behind for safety. Pt stated abdominal pain increased to 8/10, offered to notify RN but pt stated he would prefer ice. Returned to room, pt positioned to comfort in recliner with RN in room, ice packs applied to abdominal area.    Follow Up Recommendations  CIR     Equipment Recommendations  Rolling walker with 5" wheels    Recommendations for Other Services       Precautions / Restrictions Precautions Precautions: Fall Precaution Comments: L drain; abd surgery    Mobility  Bed Mobility                    Transfers Overall transfer level: Needs assistance Equipment used: Rolling walker (2 wheeled) Transfers: Sit to/from [name] Sit to Stand: Min assist;+2 safety/equipment Stand pivot transfers: Min assist;+2 safety/equipment       General transfer comment:  25% VC's for hand pacement.  Ambulation/Gait Ambulation/Gait assistance: +2 safety/equipment;Min guard Gait Distance (Feet): 240 Feet Assistive device: Rolling walker (2 wheeled) Gait Pattern/deviations: Step-through pattern;Decreased stride length;Trunk flexed Gait velocity: decreased   General Gait Details: tolerated an increased distance with avg HR 115 and RA 97%.  Slow but steady using walker for balance. Cues needed to keep trunk upright. Some c/o of SOB due to pain with inspiration from abd surgery.   Stairs             Wheelchair Mobility    Modified Rankin (Stroke Patients Only)       Balance Overall balance assessment: Needs assistance Sitting-balance support: Feet supported Sitting balance-Leahy Scale: Fair     Standing balance support: Bilateral upper extremity supported Standing balance-Leahy Scale: Poor Standing balance comment: reliant on UE support                            Cognition Arousal/Alertness: Awake/alert Behavior During Therapy: WFL for tasks assessed/performed;Anxious Overall Cognitive Status: Within Functional Limits for tasks assessed                                        Exercises      General Comments        Pertinent Vitals/Pain Pain Assessment: 0-10 Pain Score: 5  Pain Location: abdomen Pain Intervention(s): Limited activity within patient's tolerance;Monitored during session;Ice applied    Home Living  Prior Function            PT Goals (current goals can now be found in the care plan section) Acute Rehab PT Goals Potential to Achieve Goals: Good Progress towards PT goals: Progressing toward goals    Frequency    Min 3X/week      PT Plan Current plan remains appropriate    Co-evaluation              AM-PAC PT "6 Clicks" Mobility   Outcome Measure  Help needed turning from your back to your side while in a flat bed without using  bedrails?: A Lot Help needed moving from lying on your back to sitting on the side of a flat bed without using bedrails?: A Lot Help needed moving to and from a bed to a chair (including a wheelchair)?: A Lot Help needed standing up from a chair using your arms (e.g., wheelchair or bedside chair)?: A Lot Help needed to walk in hospital room?: A Lot Help needed climbing 3-5 steps with a railing? : Total 6 Click Score: 11    End of Session Equipment Utilized During Treatment: Gait belt Activity Tolerance: Patient limited by pain;Patient limited by fatigue Patient left: in chair;with call bell/phone within reach;with nursing/sitter in room Nurse Communication: Mobility status PT Visit Diagnosis: Unsteadiness on feet (R26.81);Pain     Time: 4166-0630 PT Time Calculation (min) (ACUTE ONLY): 27 min  Charges:  $Gait Training: 8-22 mins                     Alma Friendly, PTA Student  Acute Rehabilitation Services Pager : [phone] Office : [phone]    Alma Friendly 04/29/2021, 1:09 PM

## 2021-04-30 LAB — CBC
HCT: 24.2 % — ABNORMAL LOW (ref 39.0–52.0)
Hemoglobin: 7.4 g/dL — ABNORMAL LOW (ref 13.0–17.0)
MCH: 26.9 pg (ref 26.0–34.0)
MCHC: 30.6 g/dL (ref 30.0–36.0)
MCV: 88 fL (ref 80.0–100.0)
Platelets: 773 10*3/uL — ABNORMAL HIGH (ref 150–400)
RBC: 2.75 MIL/uL — ABNORMAL LOW (ref 4.22–5.81)
RDW: 16.7 % — ABNORMAL HIGH (ref 11.5–15.5)
WBC: 21.1 10*3/uL — ABNORMAL HIGH (ref 4.0–10.5)
nRBC: 1.7 % — ABNORMAL HIGH (ref 0.0–0.2)

## 2021-04-30 LAB — BASIC METABOLIC PANEL
Anion gap: 7 (ref 5–15)
BUN: 9 mg/dL (ref 8–23)
CO2: 26 mmol/L (ref 22–32)
Calcium: 8.3 mg/dL — ABNORMAL LOW (ref 8.9–10.3)
Chloride: 104 mmol/L (ref 98–111)
Creatinine, Ser: 0.54 mg/dL — ABNORMAL LOW (ref 0.61–1.24)
GFR, Estimated: >60 mL/min (ref >60)
Glucose, Bld: 99 mg/dL (ref 70–99)
Potassium: 3.7 mmol/L (ref 3.5–5.1)
Sodium: 137 mmol/L (ref 135–145)

## 2021-04-30 LAB — GLUCOSE, CAPILLARY
Glucose-Capillary: 99 mg/dL (ref 70–99)
Glucose-Capillary: 97 mg/dL (ref 70–99)

## 2021-04-30 MED ORDER — GUAIFENESIN ER 600 MG PO TB12
600.0000 mg | ORAL_TABLET | Freq: Two times a day (BID) | ORAL | Status: DC
  Administered 2021-04-30 (×2): 600 mg via ORAL
  Filled 2021-04-30 (×3): qty 1

## 2021-04-30 MED ORDER — LEVALBUTEROL HCL 0.63 MG/3ML IN NEBU
0.6300 mg | INHALATION_SOLUTION | Freq: Three times a day (TID) | RESPIRATORY_TRACT | Status: DC | PRN
  Administered 2021-04-30 (×2): 0.63 mg via RESPIRATORY_TRACT
  Filled 2021-04-30 (×2): qty 3

## 2021-04-30 MED ORDER — ASCORBIC ACID 500 MG PO TABS
500.0000 mg | ORAL_TABLET | Freq: Every day | ORAL | Status: DC
  Administered 2021-04-30 – 2021-05-01 (×2): 500 mg via ORAL
  Filled 2021-04-30 (×2): qty 1

## 2021-04-30 NOTE — Progress Notes (Signed)
5 Days Post-Op   Subjective/Chief Complaint: No complaints. Wants to go home   Objective: Vital signs in last 24 hours: Temp:  [98.5 F (36.9 C)-98.8 F (37.1 C)] 98.8 F (37.1 C) (07/22 2300) Pulse Rate:  [85-106] 106 (07/23 0600) Resp:  [18-25] 23 (07/23 0600) BP: (111-152)/(54-102) 150/102 (07/23 0600) SpO2:  [92 %-99 %] 94 % (07/23 0737) Last BM Date: 04/29/21  Intake/Output from previous day: 07/22 0701 - 07/23 0700 In: -  Out: 2250 [Urine:2250] Intake/Output this shift: No intake/output data recorded.  General appearance: alert and cooperative Resp: clear to auscultation bilaterally Cardio: regular rate and rhythm GI: soft, mild tenderness. Drain output serosanguinous. Tolerated diet  Lab Results:  Recent Labs    04/29/21 0526 04/30/21 0508  WBC 22.1* 21.1*  HGB 7.2* 7.4*  HCT 23.4* 24.2*  PLT 647* 773*   BMET Recent Labs    04/29/21 0526 04/30/21 0508  NA 136 137  K 3.9 3.7  CL 102 104  CO2 28 26  GLUCOSE 125* 99  BUN 11 9  CREATININE 0.50* 0.54*  CALCIUM 8.1* 8.3*   PT/INR No results for input(s): LABPROT, INR in the last 72 hours. ABG No results for input(s): PHART, HCO3 in the last 72 hours.  Invalid input(s): PCO2, PO2  Studies/Results: No results found.  Anti-infectives: Anti-infectives (From admission, onward)    Start     Dose/Rate Route Frequency Ordered Stop   04/24/21 1400  vancomycin (VANCOREADY) IVPB 1000 mg/200 mL  Status:  Discontinued        1,000 mg 200 mL/hr over 60 Minutes Intravenous Every 12 hours 04/24/21 0229 04/24/21 1326   04/24/21 0200  vancomycin (VANCOREADY) IVPB 2000 mg/400 mL        2,000 mg 200 mL/hr over 120 Minutes Intravenous  Once 04/24/21 0102 04/24/21 0324   04/24/21 0200  ceFEPIme (MAXIPIME) 2 g in sodium chloride 0.9 % 100 mL IVPB  Status:  Discontinued        2 g 200 mL/hr over 30 Minutes Intravenous Every 8 hours 04/24/21 0102 04/26/21 1208   04/16/21 1800  piperacillin-tazobactam (ZOSYN) IVPB  3.375 g  Status:  Discontinued       Note to Pharmacy: Pharmacy may adjust dosing strength, schedule, rate of infusion, etc as needed to optimize therapy   3.375 g 12.5 mL/hr over 240 Minutes Intravenous Every 8 hours 04/16/21 1640 04/18/21 1014   04/16/21 1730  piperacillin-tazobactam (ZOSYN) IVPB 3.375 g  Status:  Discontinued       Note to Pharmacy: Pharmacy may adjust dosing strength, schedule, rate of infusion, etc as needed to optimize therapy   3.375 g 12.5 mL/hr over 240 Minutes Intravenous Every 8 hours 04/16/21 1640 04/16/21 1640   04/16/21 1000  cefTRIAXone (ROCEPHIN) 2 g in sodium chloride 0.9 % 100 mL IVPB  Status:  Discontinued       Note to Pharmacy: Pharmacy may adjust dosing strength / duration / interval for maximal efficacy   2 g 200 mL/hr over 30 Minutes Intravenous Every 24 hours 04/16/21 0828 04/16/21 1640   04/16/21 1000  metroNIDAZOLE (FLAGYL) IVPB 500 mg  Status:  Discontinued        500 mg 100 mL/hr over 60 Minutes Intravenous Every 6 hours 04/16/21 0828 04/16/21 1640   04/16/21 0100  piperacillin-tazobactam (ZOSYN) IVPB 3.375 g        3.375 g 12.5 mL/hr over 240 Minutes Intravenous  Once 04/16/21 0057 04/16/21 0143       Assessment/Plan:  s/p Procedure(s): SPLENECTOMY (N/A) Advance diet Hg stable Would like to see lipase on drain prior to removal. Then can consider d/c  LOS: 14 days    Brian Atkinson 04/30/2021

## 2021-04-30 NOTE — Progress Notes (Signed)
Pt stated feeling dizzy after coughing. Pt stated coughing very hard, went dark , & then the room was spinning. Bp checked. BP 149/81 MAP 101. Pt stated not feeling dizzy right now, Incident lasted about 2 mins. Dr. Rito Ehrlich notified. Will continue to monitor.

## 2021-04-30 NOTE — Progress Notes (Signed)
[facility] PROGRESS NOTE   Fuad Forget LGX:[id number] DOB: 05-18-1951 DOA: 04/15/2021  PCP: Shelly Rubenstein, MD  Brief History/Interval Summary: 70 year old never smoker with PMHx significant for osteomyelitis of the patella, MRSA infection and incisional hernia (s/p initial repair 6/20) with complicated postoperative course requiring exploratory laparotomy and small bowel resection with mesh reconstruction.   Readmitted 7/8 with ruptured spleen prompting coil embolization of splenic artery by IR 7/9. Developed fever and new abdominal pain 7/18 with persistent leukocytosis. 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  Patient was admitted to the ICU and was being followed by critical care medicine.  Subsequently transferred over to medical consultation services on 7/21.    Antibiotics: Anti-infectives (From admission, onward)    Start     Dose/Rate Route Frequency Ordered Stop   04/24/21 1400  vancomycin (VANCOREADY) IVPB 1000 mg/200 mL  Status:  Discontinued        1,000 mg 200 mL/hr over 60 Minutes Intravenous Every 12 hours 04/24/21 0229 04/24/21 1326   04/24/21 0200  vancomycin (VANCOREADY) IVPB 2000 mg/400 mL        2,000 mg 200 mL/hr over 120 Minutes Intravenous  Once 04/24/21 0102 04/24/21 0324   04/24/21 0200  ceFEPIme (MAXIPIME) 2 g in sodium chloride 0.9 % 100 mL IVPB  Status:  Discontinued        2 g 200 mL/hr over 30 Minutes Intravenous Every 8 hours 04/24/21 0102 04/26/21 1208   04/16/21 1800  piperacillin-tazobactam (ZOSYN) IVPB 3.375 g  Status:  Discontinued       Note to Pharmacy: Pharmacy may adjust dosing strength, schedule, rate of infusion, etc as needed to optimize therapy   3.375 g 12.5 mL/hr over 240 Minutes Intravenous Every 8 hours 04/16/21 1640 04/18/21 1014   04/16/21 1730  piperacillin-tazobactam (ZOSYN) IVPB 3.375 g   Status:  Discontinued       Note to Pharmacy: Pharmacy may adjust dosing strength, schedule, rate of infusion, etc as needed to optimize therapy   3.375 g 12.5 mL/hr over 240 Minutes Intravenous Every 8 hours 04/16/21 1640 04/16/21 1640   04/16/21 1000  cefTRIAXone (ROCEPHIN) 2 g in sodium chloride 0.9 % 100 mL IVPB  Status:  Discontinued       Note to Pharmacy: Pharmacy may adjust dosing strength / duration / interval for maximal efficacy   2 g 200 mL/hr over 30 Minutes Intravenous Every 24 hours 04/16/21 0828 04/16/21 1640   04/16/21 1000  metroNIDAZOLE (FLAGYL) IVPB 500 mg  Status:  Discontinued        500 mg 100 mL/hr over 60 Minutes Intravenous Every 6 hours 04/16/21 0828 04/16/21 1640   04/16/21 0100  piperacillin-tazobactam (ZOSYN) IVPB 3.375 g        3.375 g 12.5 mL/hr over 240 Minutes Intravenous  Once 04/16/21 0057 04/16/21 0143       Subjective/Interval History: Patient mentioned that he is feeling better.  Occasional cough but shortness of breath is improved.  Continues to have abdominal discomfort especially with movement.  No other complaints offered.    Assessment/Plan:  Exudative left pleural effusion with left lower lobe atelectasis Patient was seen by pulmonology in the ICU.  Differential diagnoses include reactive effusion versus hemothorax secondary to splenic infarct.  Patient subsequently underwent thoracentesis on 7/17.  Noted to have exudative pleural effusion by lights criteria.  Cultures negative so far.  Completed course  of cefepime on 7/19.   From a respiratory status patient seems to be improving.  He is off of oxygen.  Febrile.  WBC is high but better than before.  Continue with incentive spirometry mobilization.  Mucinex can be added for congestion.    Large subcapsular splenic hematoma/perisplenic hemorrhage status post IR splenic artery embolization/status post splenectomy/abnormal LFTs Management per general surgery.   Normocytic anemia Hemoglobin  stable for the most part.  Continue to monitor.  No evidence of overt bleeding.    Hyperglycemia Continue to monitor CBGs.  Seems to be stable.  Possibly due to TPN.  Obesity Estimated body mass index is 33.17 kg/m as calculated from the following:   Height as of this encounter: 5' 11.5" (1.816 m).   Weight as of this encounter: 109.4 kg.  Moderate malnutrition Nutrition Problem: Moderate Malnutrition Etiology: acute illness (recent abdominal surgeries)  Signs/Symptoms: mild fat depletion, mild muscle depletion  Interventions: Boost Breeze, Prostat, TPN   Patient is stable for the most part.  TR[facility] will follow intermittently.    Apart from Mucinex he does not need any other medications for his respiratory issues at this time.  He should continue to do incentive spirometry at home.  He may continue his other home medications as before.  Does not need any antibiotics at discharge from medicine standpoint.  Medications: Scheduled:  acetaminophen  650 mg Oral Q6H   Chlorhexidine Gluconate Cloth  6 each Topical Q0600   enoxaparin (LOVENOX) injection  40 mg Subcutaneous Q24H   gabapentin  300 mg Oral TID   guaiFENesin  600 mg Oral BID   [START ON 05/08/2021] haemophilus B polysaccharide conjugate vaccine  0.5 mL Intramuscular Once   insulin aspart  0-9 Units Subcutaneous Q8H   lip balm  1 application Topical BID   mouth rinse  15 mL Mouth Rinse BID   [START ON 05/08/2021] meningococcal B  0.5 mL Intramuscular Once   [START ON 05/08/2021] meningococcal oligosaccharide  0.5 mL Intramuscular Once   metoCLOPramide (REGLAN) injection  10 mg Intravenous Q8H   pantoprazole  40 mg Oral BID   [START ON 05/08/2021] pneumococcal 20-Val Conj Vacc  0.5 mL Intramuscular Once   polycarbophil  625 mg Oral BID   polyethylene glycol  17 g Oral Daily   sodium phosphate  1 enema Rectal Once   Continuous:  methocarbamol (ROBAXIN) IV     QQP:YPPJ & mag hydroxide-simeth, bisacodyl, hydrALAZINE,  HYDROmorphone (DILAUDID) injection, levalbuterol, magic mouthwash, menthol-cetylpyridinium, methocarbamol (ROBAXIN) IV, metoprolol tartrate, ondansetron **OR** [DISCONTINUED] ondansetron (ZOFRAN) IV, oxyCODONE, oxyCODONE, phenol, prochlorperazine, simethicone   Objective:  Vital Signs  Vitals:   04/30/21 0500 04/30/21 0600 04/30/21 0737 04/30/21 0800  BP: 126/71 (!) 150/102    Pulse: (!) 101 (!) 106    Resp: (!) 23 (!) 23    Temp:    98.5 F (36.9 C)  TempSrc:    Oral  SpO2: 94% 95% 94%   Weight:      Height:        Intake/Output Summary (Last 24 hours) at 04/30/2021 0944 Last data filed at 04/30/2021 0900 Gross per 24 hour  Intake --  Output 2650 ml  Net -2650 ml    Filed Weights   04/16/21 0650 04/24/21 0427  Weight: 103.9 kg 109.4 kg    General appearance: Awake alert.  In no distress Resp: Much improved effort.  Lungs sound more clear to auscultation than before.  No wheezing or rhonchi. Cardio: S1-S2 is normal regular.  No S3-S4.  No rubs murmurs or bruit    Lab Results:  Data Reviewed: I have personally reviewed following labs and imaging studies  CBC: Recent Labs  Lab 04/25/21 0340 04/25/21 1149 04/26/21 0247 04/27/21 0246 04/27/21 1311 04/28/21 0454 04/29/21 0526 04/30/21 0508  WBC 18.0*   < > 20.9* 33.9*  --  26.4* 22.1* 21.1*  NEUTROABS 13.3*  --   --   --   --   --   --   --   HGB 7.4*   < > 8.9* 7.7* 7.8* 7.7* 7.2* 7.4*  HCT 24.7*   < > 29.4* 24.9* 25.2* 25.0* 23.4* 24.2*  MCV 89.5   < > 89.9 88.9  --  87.7 89.3 88.0  PLT 489*   < > 431* 480*  --  484* 647* 773*   < > = values in this interval not displayed.     Basic Metabolic Panel: Recent Labs  Lab 04/25/21 0340 04/25/21 2030 04/26/21 0247 04/27/21 0246 04/28/21 0454 04/29/21 0526 04/30/21 0508  NA 133*   < > 136 137 134* 136 137  K 4.3   < > 4.7 4.6 4.7 3.9 3.7  CL 100  --  103 101 100 102 104  CO2 24  --  27 28 26 28 26   GLUCOSE 136*  --  152* 138* 122* 125* 99  BUN 15  --   13 14 14 11 9   CREATININE 0.57*  --  0.65 0.59* 0.59* 0.50* 0.54*  CALCIUM 8.2*  --  8.0* 8.3* 8.1* 8.1* 8.3*  MG 1.9  --  1.8 2.2 2.0 2.0  --   PHOS 3.5  --  2.8 3.5 4.1 4.0  --    < > = values in this interval not displayed.     GFR: Estimated Creatinine Clearance: 110.6 mL/min (A) (by C-G formula based on SCr of 0.54 mg/dL (L)).  Liver Function Tests: Recent Labs  Lab 04/24/21 0434 04/25/21 0340 04/28/21 0454 04/29/21 0526  AST 39 40 282* 143*  ALT 29 32 191* 167*  ALKPHOS 98 113 136* 137*  BILITOT 0.6 0.7 0.6 0.5  PROT 6.7 6.8 6.0* 6.1*  ALBUMIN 2.4* 2.4* 1.9* 1.9*       CBG: Recent Labs  Lab 04/28/21 2318 04/29/21 0737 04/29/21 1656 04/29/21 2314 04/30/21 0759  GLUCAP 126* 120* 116* 92 99      Recent Results (from the past 240 hour(s))  Body fluid culture w Gram Stain     Status: None   Collection Time: 04/24/21 10:33 AM   Specimen: PATH Cytology Pleural fluid  Result Value Ref Range Status   Specimen Description   Final    PLEURAL Performed at [facility], [address] [place], [place]    Special Requests   Final    LEFT Performed at [facility], [address] [place], [place]    Gram Stain   Final    FEW WBC PRESENT, PREDOMINANTLY PMN NO ORGANISMS SEEN    Culture   Final    NO GROWTH 3 DAYS Performed at [facility], [address], [place], [address] [place]    Report Status 04/28/2021 FINAL  Final       Radiology Studies: No results found.     LOS: 14 days   Janequa Kipnis [email or web address] on [email or web address]  04/30/2021, 9:44 AM

## 2021-05-01 LAB — BASIC METABOLIC PANEL
Anion gap: 5 (ref 5–15)
BUN: 8 mg/dL (ref 8–23)
CO2: 25 mmol/L (ref 22–32)
Calcium: 8.1 mg/dL — ABNORMAL LOW (ref 8.9–10.3)
Chloride: 106 mmol/L (ref 98–111)
Creatinine, Ser: 0.54 mg/dL — ABNORMAL LOW (ref 0.61–1.24)
GFR, Estimated: >60 mL/min (ref >60)
Glucose, Bld: 102 mg/dL — ABNORMAL HIGH (ref 70–99)
Potassium: 3.7 mmol/L (ref 3.5–5.1)
Sodium: 136 mmol/L (ref 135–145)

## 2021-05-01 LAB — GLUCOSE, CAPILLARY
Glucose-Capillary: 101 mg/dL — ABNORMAL HIGH (ref 70–99)
Glucose-Capillary: 121 mg/dL — ABNORMAL HIGH (ref 70–99)

## 2021-05-01 LAB — CBC
HCT: 24.9 % — ABNORMAL LOW (ref 39.0–52.0)
Hemoglobin: 7.4 g/dL — ABNORMAL LOW (ref 13.0–17.0)
MCH: 26.4 pg (ref 26.0–34.0)
MCHC: 29.7 g/dL — ABNORMAL LOW (ref 30.0–36.0)
MCV: 88.9 fL (ref 80.0–100.0)
Platelets: 883 10*3/uL — ABNORMAL HIGH (ref 150–400)
RBC: 2.8 MIL/uL — ABNORMAL LOW (ref 4.22–5.81)
RDW: 16.4 % — ABNORMAL HIGH (ref 11.5–15.5)
WBC: 22.5 10*3/uL — ABNORMAL HIGH (ref 4.0–10.5)
nRBC: 0.6 % — ABNORMAL HIGH (ref 0.0–0.2)

## 2021-05-01 MED ORDER — OXYCODONE HCL 5 MG PO TABS: 5.0000 mg | ORAL_TABLET | Freq: Four times a day (QID) | ORAL | 0 refills | Status: AC | PRN

## 2021-05-01 MED ORDER — BENZONATATE 100 MG PO CAPS: 100.0000 mg | ORAL_CAPSULE | Freq: Three times a day (TID) | ORAL | Status: DC | PRN

## 2021-05-01 MED ORDER — BENZONATATE 100 MG PO CAPS: 100.0000 mg | ORAL_CAPSULE | Freq: Three times a day (TID) | ORAL | 0 refills | Status: AC | PRN

## 2021-05-01 NOTE — Progress Notes (Signed)
Occupational Therapy Treatment Patient Details Name: Brian Atkinson MRN: [id number] DOB: December 20, 1950 Today's Date: 05/01/2021    History of present illness 70 year old never smoker with PMHx significant for osteomyelitis of the patella, MRSA infection and incisional hernia (s/p initial repair 6/20) with complicated postoperative course requiring exploratory laparotomy and small bowel resection with mesh reconstruction.     Readmitted 7/8 with ruptured spleen prompting coil embolization of splenic artery by IR 7/9. Developed fever and new abdominal pain 7/18 with persistent leukocytosis. 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   OT comments  Upon OT arrival patient reports he is going home today. Has to stay in bed for ~20 minutes due to just having PICC removed however is interested in education regarding exercise once at home. Please see exercise section below for details. Patient denied any concerns/questions regarding self care tasks at home, states once his clothes are here he can dress himself "then I'm out of here."    Follow Up Recommendations  No OT follow up    Equipment Recommendations  Tub/shower seat       Precautions / Restrictions Precautions Precautions: Fall Precaution Comments: L drain; abd surgery Restrictions Weight Bearing Restrictions: No              ADL either performed or assessed with clinical judgement   ADL                                         General ADL Comments: patient had PICC removed therefore needing to stay in bed for 20 minutes      Cognition Arousal/Alertness: Awake/alert Behavior During Therapy: WFL for tasks assessed/performed Overall Cognitive Status: Within Functional Limits for tasks assessed                                          Exercises Exercises: Other  exercises Other Exercises Other Exercises: patient had questions regarding exercise once at home, especially concerning his abdomen/stitiches. Educate patient to avoid core exercises at this time as he is only post op ~2 weeks and stitches have not yet dissolved. educate patient on UE/LE extremity exercise such as mini squats, standing marches, heel raises and chair push ups as long as it does not strain his abdomen. Also educate patient on benefits of walking while at home, has a tredmill and to start gradually. Educate patient how to grade all exercises to make easier or more advanced as patient recovers/heals. Patient very appreciative and verbalize understanding. Other Exercises: '                Pertinent Vitals/ Pain       Pain Assessment: Faces Faces Pain Scale: No hurt   Frequency  Min 2X/week        Progress Toward Goals  OT Goals(current goals can now be found in the care plan section)  Progress towards OT goals: Progressing toward goals  Acute Rehab OT Goals Patient Stated Goal: to be independent OT Goal Formulation: With patient Time For Goal Achievement: 05/11/21 Potential to Achieve Goals: Good ADL Goals Pt Will Perform Lower Body Bathing: with supervision;with adaptive equipment;sit to/from stand;sitting/lateral leans Pt Will Perform Lower Body Dressing: with supervision;with adaptive equipment;sit to/from stand;sitting/lateral  leans Pt Will Transfer to Toilet: with supervision;ambulating Pt Will Perform Toileting - Clothing Manipulation and hygiene: with supervision;sit to/from stand;sitting/lateral leans  Plan Discharge plan needs to be updated       AM-PAC OT "6 Clicks" Daily Activity     Outcome Measure   Help from another person eating meals?: None Help from another person taking care of personal grooming?: A Little Help from another person toileting, which includes using toliet, bedpan, or urinal?: A Little Help from another person bathing  (including washing, rinsing, drying)?: A Little Help from another person to put on and taking off regular upper body clothing?: A Little Help from another person to put on and taking off regular lower body clothing?: A Little 6 Click Score: 19    End of Session    OT Visit Diagnosis: Pain Pain - part of body:  (abodmen)   Activity Tolerance Patient tolerated treatment well   Patient Left in bed;with call bell/phone within reach   Nurse Communication          Time: 8891-6945 OT Time Calculation (min): 10 min  Charges: OT General Charges $OT Visit: 1 Visit OT Treatments $Therapeutic Activity: 8-22 mins  Marlyce Huge OT OT pager: [phone]   Carmelia Roller 05/01/2021, 10:53 AM

## 2021-05-01 NOTE — Progress Notes (Signed)
6 Days Post-Op   Subjective/Chief Complaint: No complaints. Wants to go home   Objective: Vital signs in last 24 hours: Temp:  [98.3 F (36.8 C)-98.5 F (36.9 C)] 98.3 F (36.8 C) (07/24 0800) Pulse Rate:  [85-107] 94 (07/24 0458) Resp:  [9-28] 16 (07/24 0458) BP: (113-153)/(30-83) 142/71 (07/24 0400) SpO2:  [92 %-98 %] 94 % (07/24 0458) Last BM Date: 04/30/21  Intake/Output from previous day: 07/23 0701 - 07/24 0700 In: 960 [P.O.:960] Out: 2660 [Urine:2650; Drains:10] Intake/Output this shift: No intake/output data recorded.  General appearance: alert and cooperative Resp: clear to auscultation bilaterally Cardio: regular rate and rhythm GI: soft, nontender. Drain output serosanguinous  Lab Results:  Recent Labs    04/30/21 0508 05/01/21 0503  WBC 21.1* 22.5*  HGB 7.4* 7.4*  HCT 24.2* 24.9*  PLT 773* 883*   BMET Recent Labs    04/30/21 0508 05/01/21 0503  NA 137 136  K 3.7 3.7  CL 104 106  CO2 26 25  GLUCOSE 99 102*  BUN 9 8  CREATININE 0.54* 0.54*  CALCIUM 8.3* 8.1*   PT/INR No results for input(s): LABPROT, INR in the last 72 hours. ABG No results for input(s): PHART, HCO3 in the last 72 hours.  Invalid input(s): PCO2, PO2  Studies/Results: No results found.  Anti-infectives: Anti-infectives (From admission, onward)    Start     Dose/Rate Route Frequency Ordered Stop   04/24/21 1400  vancomycin (VANCOREADY) IVPB 1000 mg/200 mL  Status:  Discontinued        1,000 mg 200 mL/hr over 60 Minutes Intravenous Every 12 hours 04/24/21 0229 04/24/21 1326   04/24/21 0200  vancomycin (VANCOREADY) IVPB 2000 mg/400 mL        2,000 mg 200 mL/hr over 120 Minutes Intravenous  Once 04/24/21 0102 04/24/21 0324   04/24/21 0200  ceFEPIme (MAXIPIME) 2 g in sodium chloride 0.9 % 100 mL IVPB  Status:  Discontinued        2 g 200 mL/hr over 30 Minutes Intravenous Every 8 hours 04/24/21 0102 04/26/21 1208   04/16/21 1800  piperacillin-tazobactam (ZOSYN) IVPB  3.375 g  Status:  Discontinued       Note to Pharmacy: Pharmacy may adjust dosing strength, schedule, rate of infusion, etc as needed to optimize therapy   3.375 g 12.5 mL/hr over 240 Minutes Intravenous Every 8 hours 04/16/21 1640 04/18/21 1014   04/16/21 1730  piperacillin-tazobactam (ZOSYN) IVPB 3.375 g  Status:  Discontinued       Note to Pharmacy: Pharmacy may adjust dosing strength, schedule, rate of infusion, etc as needed to optimize therapy   3.375 g 12.5 mL/hr over 240 Minutes Intravenous Every 8 hours 04/16/21 1640 04/16/21 1640   04/16/21 1000  cefTRIAXone (ROCEPHIN) 2 g in sodium chloride 0.9 % 100 mL IVPB  Status:  Discontinued       Note to Pharmacy: Pharmacy may adjust dosing strength / duration / interval for maximal efficacy   2 g 200 mL/hr over 30 Minutes Intravenous Every 24 hours 04/16/21 0828 04/16/21 1640   04/16/21 1000  metroNIDAZOLE (FLAGYL) IVPB 500 mg  Status:  Discontinued        500 mg 100 mL/hr over 60 Minutes Intravenous Every 6 hours 04/16/21 0828 04/16/21 1640   04/16/21 0100  piperacillin-tazobactam (ZOSYN) IVPB 3.375 g        3.375 g 12.5 mL/hr over 240 Minutes Intravenous  Once 04/16/21 0057 04/16/21 0143       Assessment/Plan: s/p Procedure(s):  SPLENECTOMY (N/A) Advance diet Will need pneumovax 2 weeks after surgery Will keep drain and have it removed this week once lab is back Hg stable D/c today  LOS: 15 days    Brian Atkinson 05/01/2021

## 2021-05-01 NOTE — Progress Notes (Signed)
Patient's wife at bedside, all belongings packed by patient's wife per their request.  Patient refused scheduled medications from 1400h-1600h and CBG check in anticipation of discharge, education provided, patient verbalized understanding.  Patient off telemetry and in wheelchair. Patient wheeled down to main entrance and assisted safely into vehicle accompanied by wife and daughter.

## 2021-05-01 NOTE — Progress Notes (Signed)
Pt provided discharge papers, as well as, education. Wife & daughter told about pt's discharge & will be picking pt up. All belongings packed. Pt stable at time of discharge

## 2021-05-01 NOTE — Progress Notes (Signed)
[facility] PROGRESS NOTE   Josel Keo NLZ:[id number] DOB: 1951/05/26 DOA: 04/15/2021  PCP: Shelly Rubenstein, MD  Brief History/Interval Summary: 70 year old never smoker with PMHx significant for osteomyelitis of the patella, MRSA infection and incisional hernia (s/p initial repair 6/20) with complicated postoperative course requiring exploratory laparotomy and small bowel resection with mesh reconstruction.   Readmitted 7/8 with ruptured spleen prompting coil embolization of splenic artery by IR 7/9. Developed fever and new abdominal pain 7/18 with persistent leukocytosis. 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  Patient was admitted to the ICU and was being followed by critical care medicine.  Subsequently transferred over to medical consultation services on 7/21.    Antibiotics: Anti-infectives (From admission, onward)    Start     Dose/Rate Route Frequency Ordered Stop   04/24/21 1400  vancomycin (VANCOREADY) IVPB 1000 mg/200 mL  Status:  Discontinued        1,000 mg 200 mL/hr over 60 Minutes Intravenous Every 12 hours 04/24/21 0229 04/24/21 1326   04/24/21 0200  vancomycin (VANCOREADY) IVPB 2000 mg/400 mL        2,000 mg 200 mL/hr over 120 Minutes Intravenous  Once 04/24/21 0102 04/24/21 0324   04/24/21 0200  ceFEPIme (MAXIPIME) 2 g in sodium chloride 0.9 % 100 mL IVPB  Status:  Discontinued        2 g 200 mL/hr over 30 Minutes Intravenous Every 8 hours 04/24/21 0102 04/26/21 1208   04/16/21 1800  piperacillin-tazobactam (ZOSYN) IVPB 3.375 g  Status:  Discontinued       Note to Pharmacy: Pharmacy may adjust dosing strength, schedule, rate of infusion, etc as needed to optimize therapy   3.375 g 12.5 mL/hr over 240 Minutes Intravenous Every 8 hours 04/16/21 1640 04/18/21 1014   04/16/21 1730  piperacillin-tazobactam (ZOSYN) IVPB 3.375 g   Status:  Discontinued       Note to Pharmacy: Pharmacy may adjust dosing strength, schedule, rate of infusion, etc as needed to optimize therapy   3.375 g 12.5 mL/hr over 240 Minutes Intravenous Every 8 hours 04/16/21 1640 04/16/21 1640   04/16/21 1000  cefTRIAXone (ROCEPHIN) 2 g in sodium chloride 0.9 % 100 mL IVPB  Status:  Discontinued       Note to Pharmacy: Pharmacy may adjust dosing strength / duration / interval for maximal efficacy   2 g 200 mL/hr over 30 Minutes Intravenous Every 24 hours 04/16/21 0828 04/16/21 1640   04/16/21 1000  metroNIDAZOLE (FLAGYL) IVPB 500 mg  Status:  Discontinued        500 mg 100 mL/hr over 60 Minutes Intravenous Every 6 hours 04/16/21 0828 04/16/21 1640   04/16/21 0100  piperacillin-tazobactam (ZOSYN) IVPB 3.375 g        3.375 g 12.5 mL/hr over 240 Minutes Intravenous  Once 04/16/21 0057 04/16/21 0143        Subjective/Interval History: Patient denies any further episodes of cough after his significant coughing spell yesterday.  His dizziness lasted about 2 minutes.  No further recurrence.  Feels much better this morning.  Wants to go home.  Tolerating his diet.  Has had multiple bowel movements.  Urinating well.     Assessment/Plan:  Exudative left pleural effusion with left lower lobe atelectasis Patient was seen by pulmonology in the ICU.  Differential diagnoses include reactive effusion versus hemothorax secondary to splenic infarct.  Patient subsequently underwent thoracentesis  on 7/17.  Noted to have exudative pleural effusion by lights criteria.  Cultures negative so far.  Completed course of cefepime on 7/19.   From a respiratory standpoint patient has been stable.  He has been weaned off of oxygen.  Occasional coughing episodes have been present.  Antitussive agents have been prescribed.  Continue with incentive spirometry and mobilization.   Large subcapsular splenic hematoma/perisplenic hemorrhage status post IR splenic artery  embolization/status post splenectomy/abnormal LFTs Management per general surgery.   Normocytic anemia No evidence for bleeding.  Hemoglobin is stable.  Hyperglycemia This was likely due to TPN.  Now his glucose levels have stabilized.  Obesity Estimated body mass index is 33.17 kg/m as calculated from the following:   Height as of this encounter: 5' 11.5" (1.816 m).   Weight as of this encounter: 109.4 kg.  Moderate malnutrition Nutrition Problem: Moderate Malnutrition Etiology: acute illness (recent abdominal surgeries)  Signs/Symptoms: mild fat depletion, mild muscle depletion  Interventions: Boost Breeze, Prostat, TPN  Patient remains stable from medicine standpoint for discharge.  Prescription for Kimberlee Nearing has been sent to his pharmacy.  No other medication needs from our standpoint.    Medications: Scheduled:  acetaminophen  650 mg Oral Q6H   vitamin C  500 mg Oral Daily   Chlorhexidine Gluconate Cloth  6 each Topical Q0600   enoxaparin (LOVENOX) injection  40 mg Subcutaneous Q24H   gabapentin  300 mg Oral TID   [START ON 05/08/2021] haemophilus B polysaccharide conjugate vaccine  0.5 mL Intramuscular Once   insulin aspart  0-9 Units Subcutaneous Q8H   lip balm  1 application Topical BID   mouth rinse  15 mL Mouth Rinse BID   [START ON 05/08/2021] meningococcal B  0.5 mL Intramuscular Once   [START ON 05/08/2021] meningococcal oligosaccharide  0.5 mL Intramuscular Once   metoCLOPramide (REGLAN) injection  10 mg Intravenous Q8H   pantoprazole  40 mg Oral BID   [START ON 05/08/2021] pneumococcal 20-Val Conj Vacc  0.5 mL Intramuscular Once   polycarbophil  625 mg Oral BID   polyethylene glycol  17 g Oral Daily   sodium phosphate  1 enema Rectal Once   Continuous:  methocarbamol (ROBAXIN) IV     WUJ:WJXB & mag hydroxide-simeth, benzonatate, bisacodyl, hydrALAZINE, HYDROmorphone (DILAUDID) injection, levalbuterol, magic mouthwash, menthol-cetylpyridinium, methocarbamol  (ROBAXIN) IV, metoprolol tartrate, ondansetron **OR** [DISCONTINUED] ondansetron (ZOFRAN) IV, oxyCODONE, oxyCODONE, phenol, prochlorperazine, simethicone   Objective:  Vital Signs  Vitals:   05/01/21 0313 05/01/21 0400 05/01/21 0458 05/01/21 0800  BP: (!) 127/30 (!) 142/71  (!) 162/79  Pulse: (!) 106 85 94 94  Resp: (!) 24 15 16  (!) 21  Temp:   98.5 F (36.9 C) 98.3 F (36.8 C)  TempSrc:   Oral Oral  SpO2: 96% 94% 94% 94%  Weight:      Height:        Intake/Output Summary (Last 24 hours) at 05/01/2021 0858 Last data filed at 05/01/2021 0700 Gross per 24 hour  Intake 1200 ml  Output 2660 ml  Net -1460 ml    Filed Weights   04/16/21 0650 04/24/21 0427  Weight: 103.9 kg 109.4 kg    Patient is awake alert.  In no distress Lungs are clear to auscultation bilaterally.  No wheezing or rhonchi S1-S2 is normal regular.  No S3-S4.  No rubs murmurs or bruit.     Lab Results:  Data Reviewed: I have personally reviewed following labs and imaging studies  CBC: Recent Labs  Lab 04/25/21 0340 04/25/21 1149 04/27/21 0246 04/27/21 1311 04/28/21 0454 04/29/21 0526 04/30/21 0508 05/01/21 0503  WBC 18.0*   < > 33.9*  --  26.4* 22.1* 21.1* 22.5*  NEUTROABS 13.3*  --   --   --   --   --   --   --   HGB 7.4*   < > 7.7* 7.8* 7.7* 7.2* 7.4* 7.4*  HCT 24.7*   < > 24.9* 25.2* 25.0* 23.4* 24.2* 24.9*  MCV 89.5   < > 88.9  --  87.7 89.3 88.0 88.9  PLT 489*   < > 480*  --  484* 647* 773* 883*   < > = values in this interval not displayed.     Basic Metabolic Panel: Recent Labs  Lab 04/25/21 0340 04/25/21 2030 04/26/21 0247 04/27/21 0246 04/28/21 0454 04/29/21 0526 04/30/21 0508 05/01/21 0503  NA 133*   < > 136 137 134* 136 137 136  K 4.3   < > 4.7 4.6 4.7 3.9 3.7 3.7  CL 100  --  103 101 100 102 104 106  CO2 24  --  27 28 26 28 26 25   GLUCOSE 136*  --  152* 138* 122* 125* 99 102*  BUN 15  --  13 14 14 11 9 8   CREATININE 0.57*  --  0.65 0.59* 0.59* 0.50* 0.54* 0.54*   CALCIUM 8.2*  --  8.0* 8.3* 8.1* 8.1* 8.3* 8.1*  MG 1.9  --  1.8 2.2 2.0 2.0  --   --   PHOS 3.5  --  2.8 3.5 4.1 4.0  --   --    < > = values in this interval not displayed.     GFR: Estimated Creatinine Clearance: 110.6 mL/min (A) (by C-G formula based on SCr of 0.54 mg/dL (L)).  Liver Function Tests: Recent Labs  Lab 04/25/21 0340 04/28/21 0454 04/29/21 0526  AST 40 282* 143*  ALT 32 191* 167*  ALKPHOS 113 136* 137*  BILITOT 0.7 0.6 0.5  PROT 6.8 6.0* 6.1*  ALBUMIN 2.4* 1.9* 1.9*       CBG: Recent Labs  Lab 04/29/21 2314 04/30/21 0759 04/30/21 1528 05/01/21 0019 05/01/21 0749  GLUCAP 92 99 97 101* 121*      Recent Results (from the past 240 hour(s))  Body fluid culture w Gram Stain     Status: None   Collection Time: 04/24/21 10:33 AM   Specimen: PATH Cytology Pleural fluid  Result Value Ref Range Status   Specimen Description   Final    PLEURAL Performed at [facility], [address], [place], [place]    Special Requests   Final    LEFT Performed at [facility], [address] [place], [place]    Gram Stain   Final    FEW WBC PRESENT, PREDOMINANTLY PMN NO ORGANISMS SEEN    Culture   Final    NO GROWTH 3 DAYS Performed at [facility], [address], [place], [address] [place]    Report Status 04/28/2021 FINAL  Final       Radiology Studies: No results found.     LOS: 15 days   Chelle Cayton [id number] on [email or web address]  05/01/2021, 8:58 AM

## 2021-05-03 LAB — LIPASE, FLUID: Lipase-Fluid: 20 U/L

## 2021-05-04 NOTE — Discharge Summary (Signed)
Patient ID: Brian Atkinson [id number] 70 y.o. 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   Acute blood loss anemia 07/28/2020   Closed bicondylar fracture of left tibial plateau 07/23/2020   History of MRSA infection left patella  09/26/2017   MRSA (methicillin resistant Staphylococcus aureus)    Unspecified constipation 06/20/2014   Iron deficiency anemia 06/18/2014     Discharge Diagnoses: Spontaneous splenic rupture Patient Active Problem List   Diagnosis Date Noted   Dehydration 04/16/2021   Acute kidney injury (HCC) 04/16/2021   Hypotension 04/16/2021   Leukocytosis 04/16/2021   Nontraumatic splenic rupture s/p IR embolization 04/15/2021 04/16/2021   Malnutrition of moderate degree 04/07/2021   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   Recurrent incisional hernia 03/28/2021   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   Acute blood loss anemia 07/28/2020   Closed bicondylar fracture of left tibial plateau 07/23/2020   History of MRSA infection left patella  09/26/2017   MRSA (methicillin resistant Staphylococcus aureus)    Unspecified constipation 06/20/2014   Iron deficiency anemia 06/18/2014    Operations: Procedure(s): SPLENECTOMY  Admission Condition: good  Discharged Condition: good  Indication for Admission: Spontaneous splenic rupture  Hospital Course:  Brian Atkinson is a 70  year old male who underwent recurrent incisional hernia repair with removal of previous intraperitoneal mesh and rives stoppa style repair.  His posterior layer broke down where the previous mesh had been removed requiring takeback to the operating room for diagnostic laparotomy converted to exploratory laparotomy, small bowel resection with vicryl mesh reconstruction of the posterior layer and phasix retromuscular mesh placement on 04/04/2021. He recovered from this in the hospital, with a prolonged postoperative ileus which was not unexpected due to the large interparietal hernia.  A CT guided drain was placed on 04/13/21, to drain a pelvic fluid collection.  He was discharged on 04/15/21.  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  On 04/16/21, he underwent coil embolization of the splenic artery.  He failed to improve symptomatically after embolization and was taken for open splenectomy on 04/25/21.    Consults: Medicine team  Significant Diagnostic Studies: Cross-sectional imaging  Treatments: surgery: as above  Disposition: Home  Patient Instructions:  Allergies as of 05/01/2021       Reactions   Versed [midazolam] Other (See Comments)   Memory problems        Medication List     STOP taking these medications    amoxicillin-clavulanate 875-125 MG tablet Commonly known as: AUGMENTIN   cetirizine 10 MG tablet Commonly known as: ZYRTEC   One-A-Day Mens 50+ Tabs       TAKE these medications    acetaminophen 500 MG tablet Commonly known as: TYLENOL Take 2 tablets (1,000 mg total) by mouth every 6 (six) hours as needed for mild pain or fever.   benzonatate 100 MG capsule Commonly known as: TESSALON Take 1 capsule (100 mg total) by  mouth 3 (three) times daily as needed for cough.   docusate sodium 100 MG capsule Commonly known as: COLACE Take 1 capsule (100 mg total) by mouth 2 (two) times daily.    gabapentin 300 MG capsule Commonly known as: NEURONTIN Take 1 capsule (300 mg total) by mouth 3 (three) times daily.   oxyCODONE 5 MG immediate release tablet Commonly known as: Oxy IR/ROXICODONE Take 1 tablet (5 mg total) by mouth every 4 (four) hours as needed for moderate pain. What changed: Another medication with the same name was added. Make sure you understand how and when to take each.   oxyCODONE 5 MG immediate release tablet Commonly known as: Oxy IR/ROXICODONE Take 1 tablet (5 mg total) by mouth every 6 (six) hours as needed for moderate pain. What changed: You were already taking a medication with the same name, and this prescription was added. Make sure you understand how and when to take each.   pantoprazole 40 MG tablet Commonly known as: PROTONIX Take 1 tablet (40 mg total) by mouth 2 (two) times daily.   polyethylene glycol 17 g packet Commonly known as: MIRALAX / GLYCOLAX Take 17 g by mouth daily.   sodium chloride flush 0.9 % Soln Commonly known as: NS 5 mLs by Intracatheter route daily.        Activity: no heavy lifting for 4 weeks Diet: regular diet Wound Care: keep wound clean and dry  Follow-up:  With Dr. Dossie Der Signed: Hyman Hopes [facility], [place]   05/04/2021, 11:41 AM

## 2021-05-17 IMAGING — RF DG C-ARM 1-60 MIN
1 series · 10 of 10 positions shown · non-contrast
Comparison: None.

CLINICAL DATA: Open reduction and internal fixation of left tibial
plateau fracture.

EXAM:
LEFT KNEE - COMPLETE 4+ VIEW; DG C-ARM 1-60 MIN
Radiation exposure index: 8.36 mGy.

[Series 1: run · 10 of 10 slices shown]
[im 1/10]
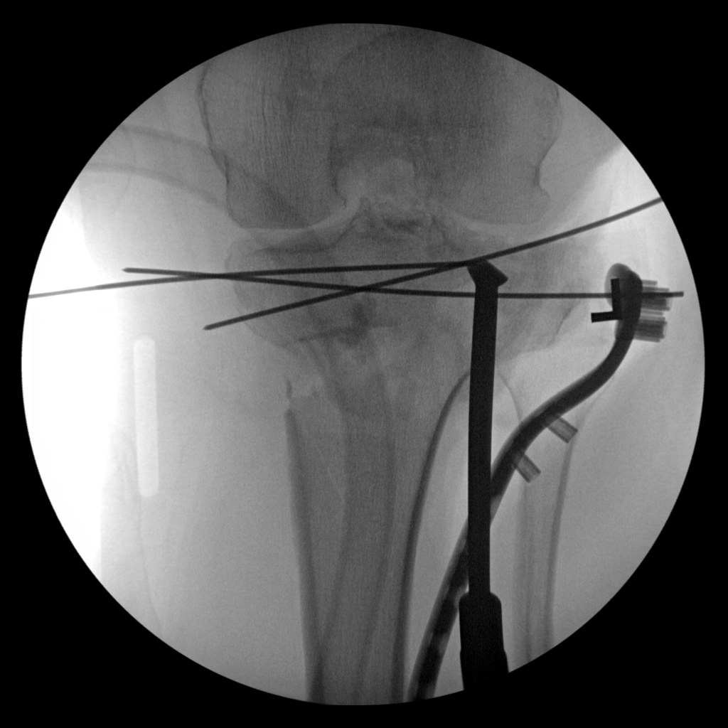
[im 2/10]
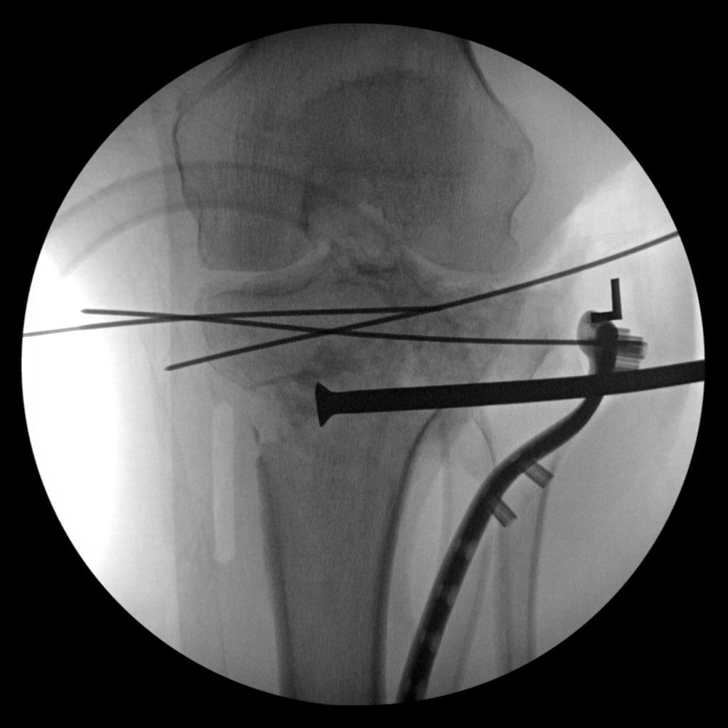
[im 3/10]
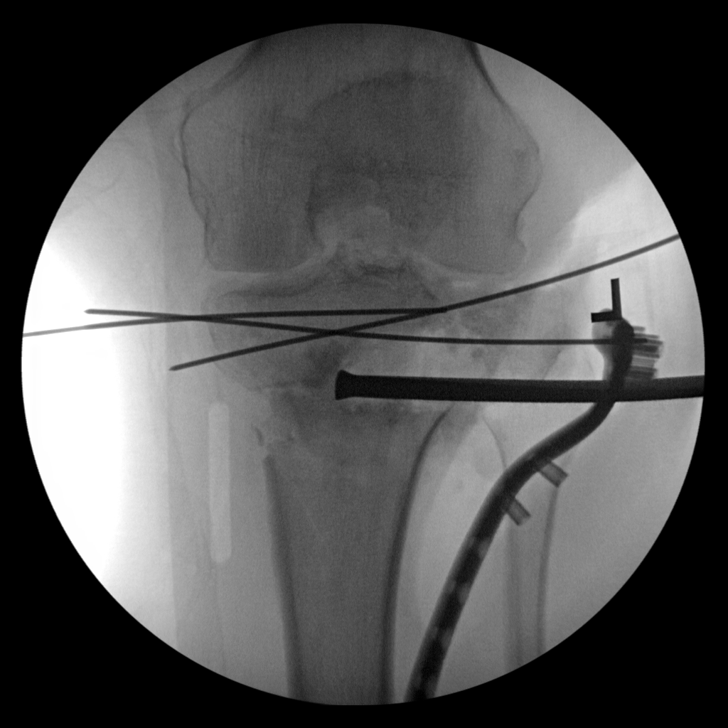
[im 4/10]
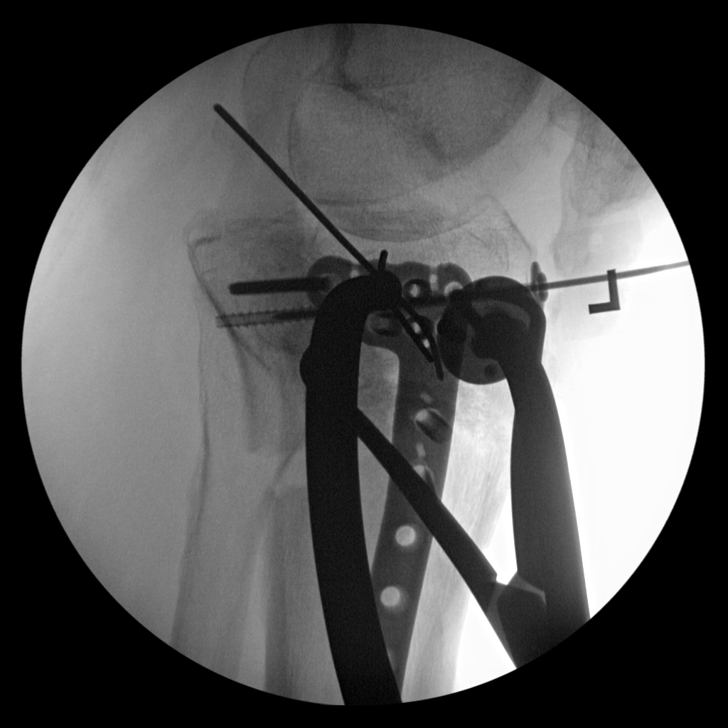
[im 5/10]
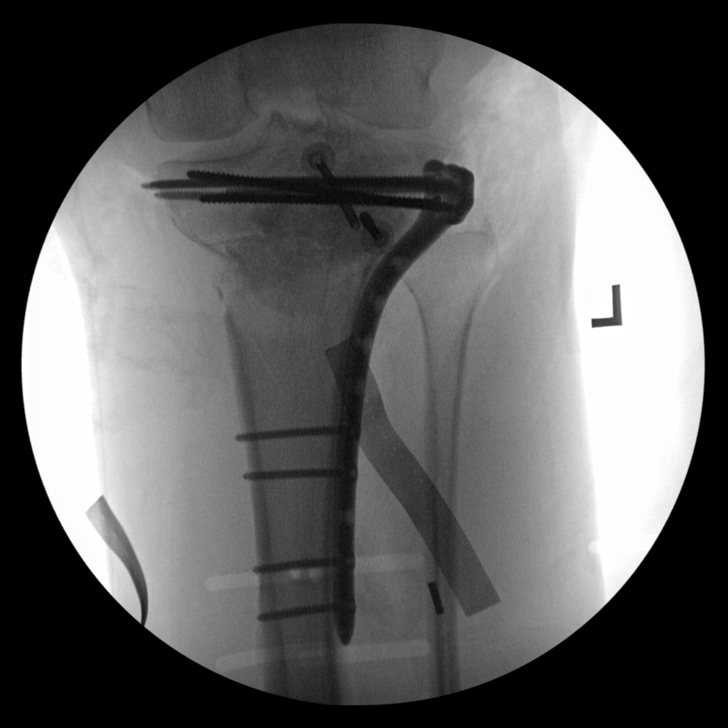
[im 6/10]
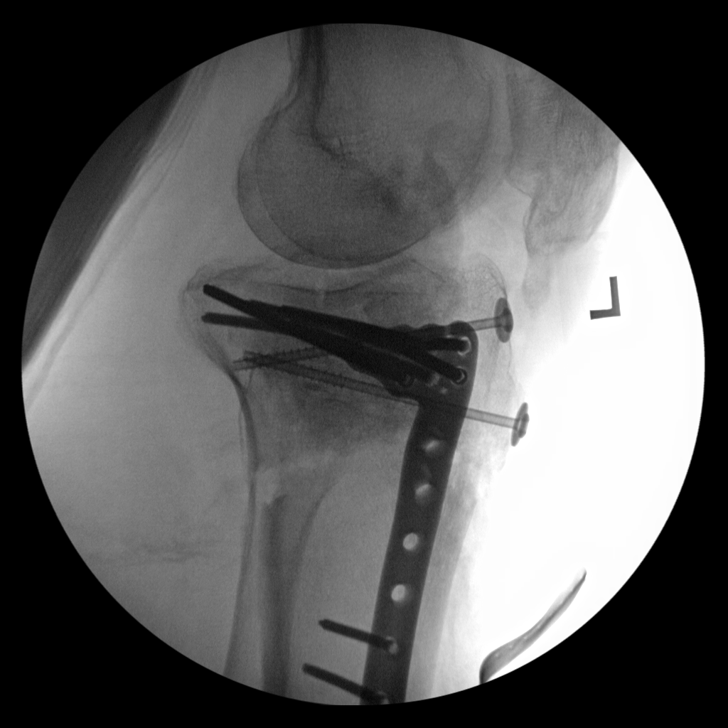
[im 7/10]
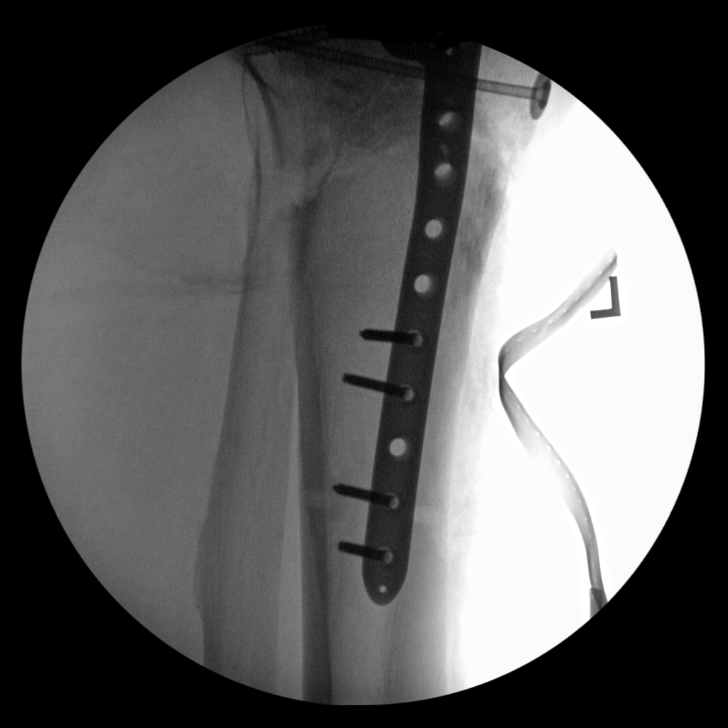
[im 8/10]
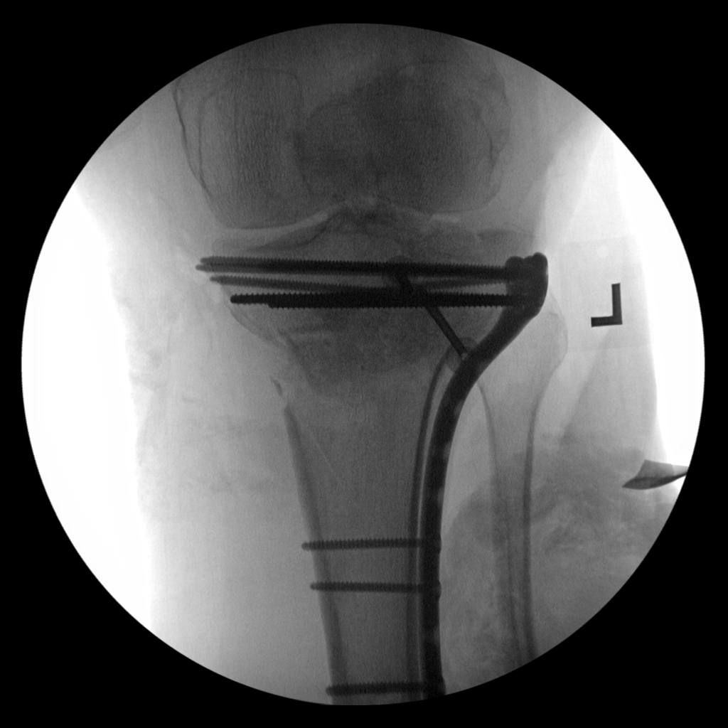
[im 9/10]
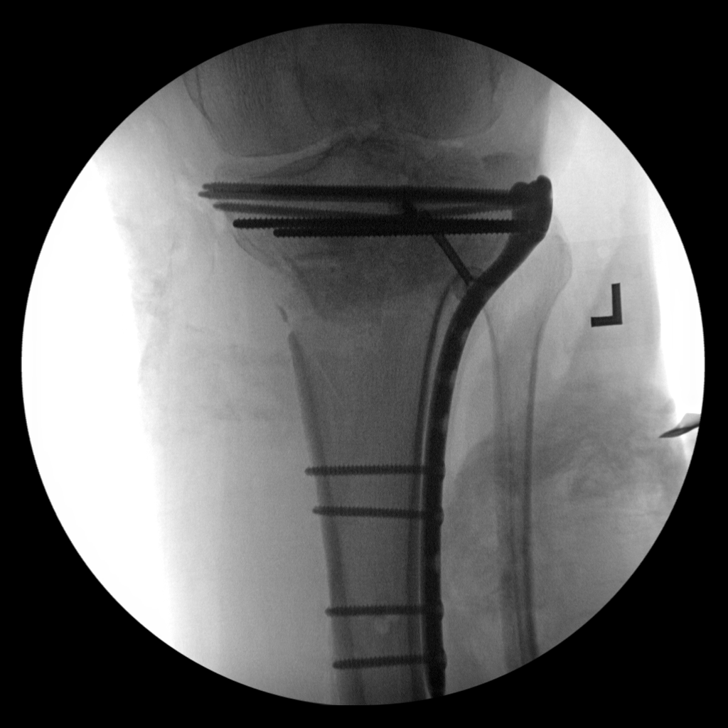
[im 10/10]
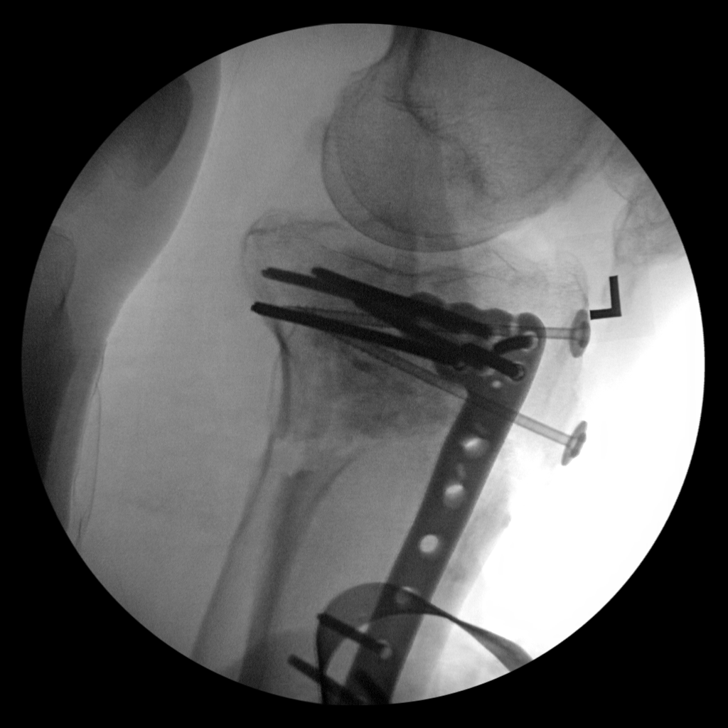

[10 of 10 positions shown; findings below may reference images not displayed]

FINDINGS: Ten intraoperative fluoroscopic images were obtained of the left
knee. These images demonstrate surgical internal fixation of tibial
plateau fracture. Good alignment of fracture components is noted.
IMPRESSION: Status post surgical internal fixation of left tibial plateau
fracture.

## 2022-01-16 IMAGING — CT CT IMAGE GUIDED DRAINAGE BY PERCUTANEOUS CATHETER
2 of 7 series · 13 of 32 positions shown, 18 images · non-contrast
Comparison: none

INDICATION: 70-year-old male with a rim enhancing pelvic fluid collection
following ventral hernia repair. In the setting of leukocytosis,
findings are concerning for possible abscess. He presents for
CT-guided drain placement.

[Series 2: i-spiral 5.0 bf37 · axial · 0.94mm/px · z∈[+1207,+1270]mm · 5 of 37 slices shown (1 of 2)]
[im 5/37  soft-tissue]
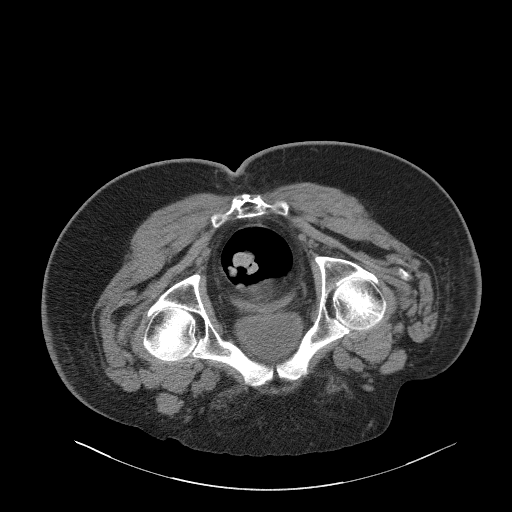
[im 10/37  soft-tissue]
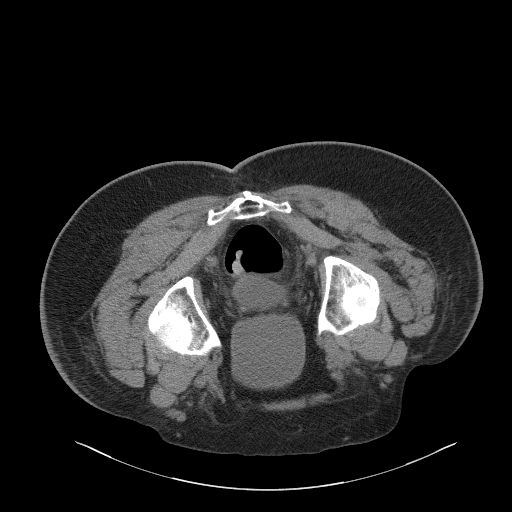
[im 14/37  soft-tissue]
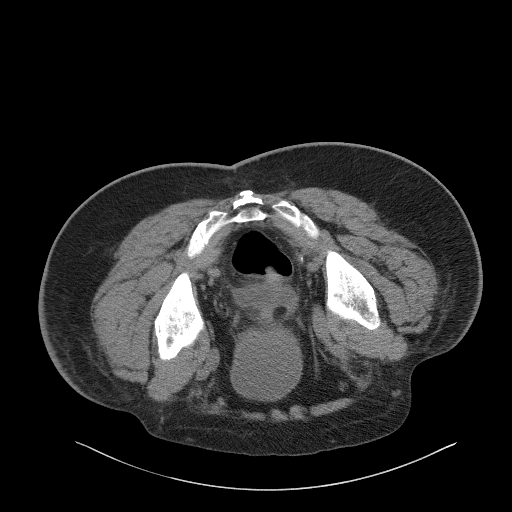
[im 19/37  soft-tissue]
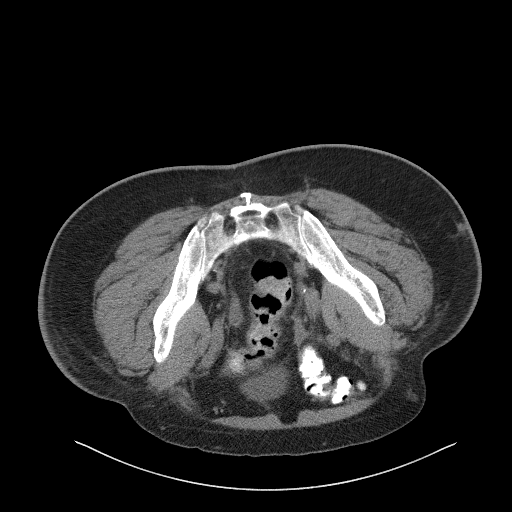
[im 23/37  soft-tissue]
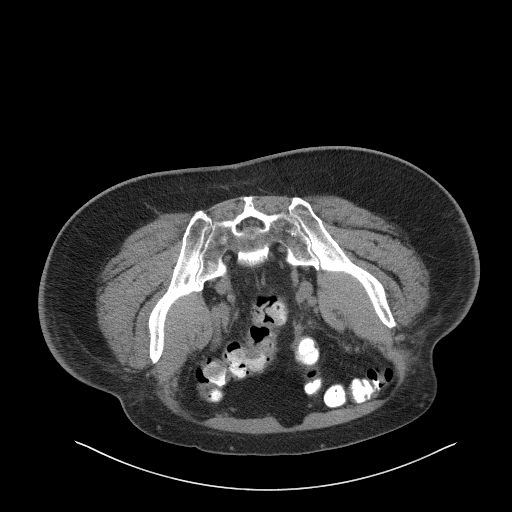

[Series 4: i-spiral 5.0 bf37 · axial · 0.98mm/px · z∈[+1271,+1376]mm · 8 of 40 slices shown, 13 images (2 of 2)]
[im 5/40  soft-tissue]
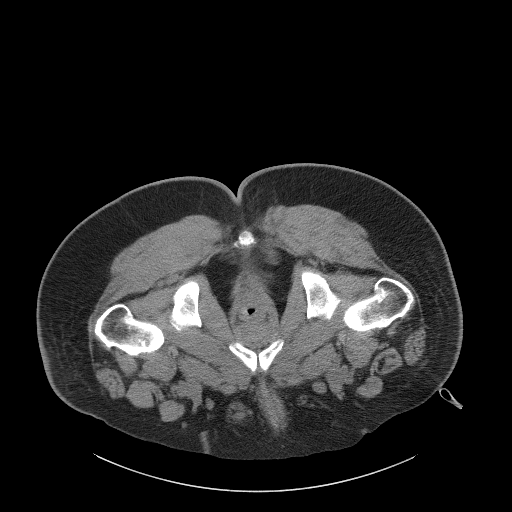
[im 5/40  bone]
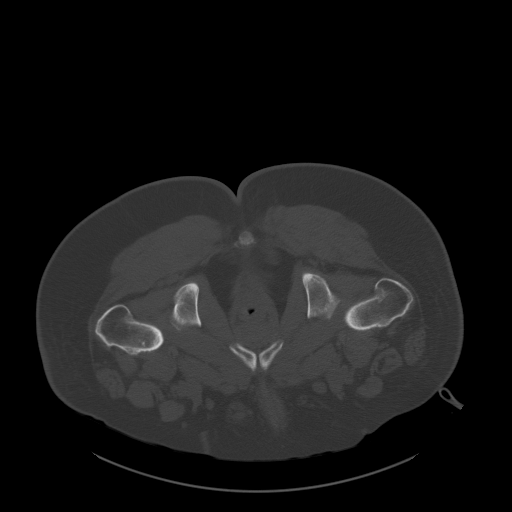
[im 9/40  soft-tissue]
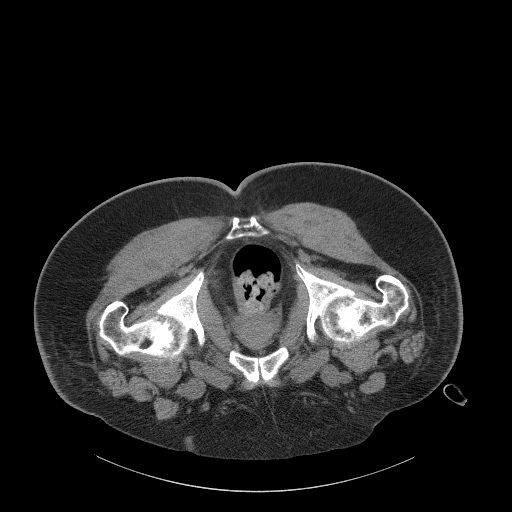
[im 14/40  soft-tissue]
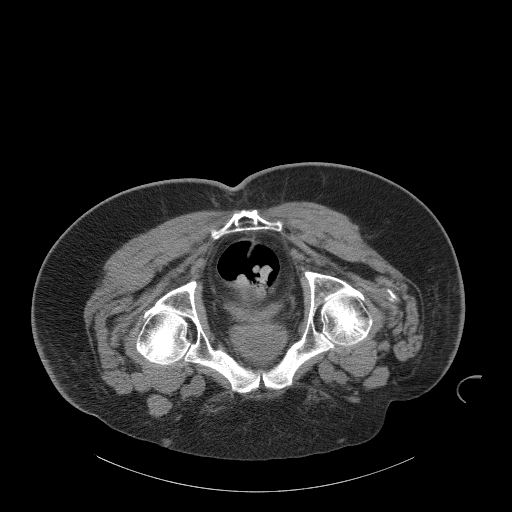
[im 18/40  soft-tissue]
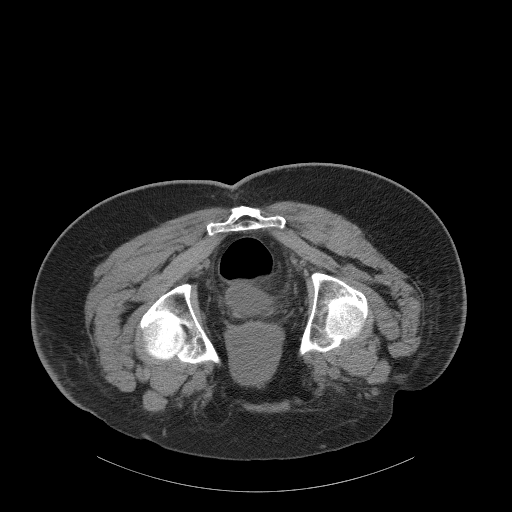
[im 22/40  soft-tissue]
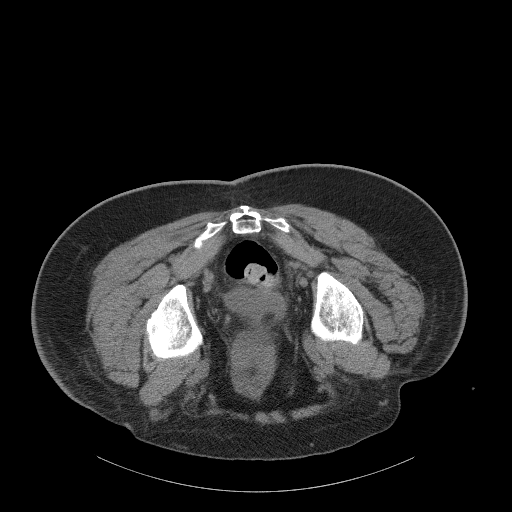
[im 22/40  lung]
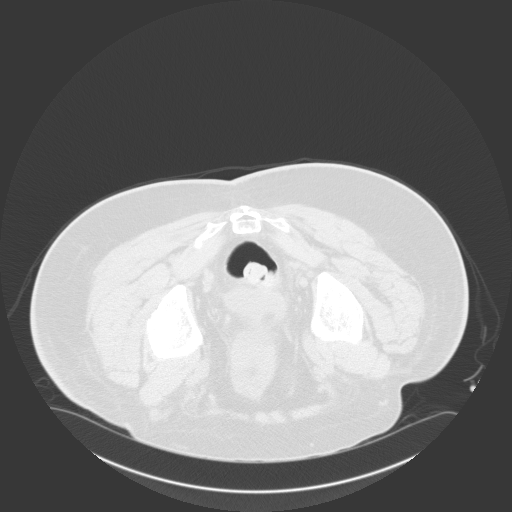
[im 27/40  soft-tissue]
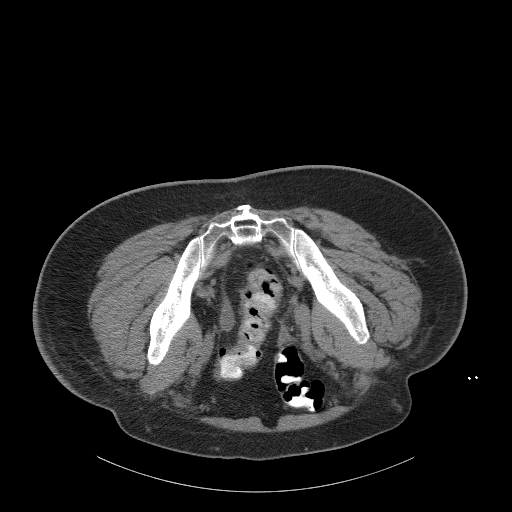
[im 27/40  lung]
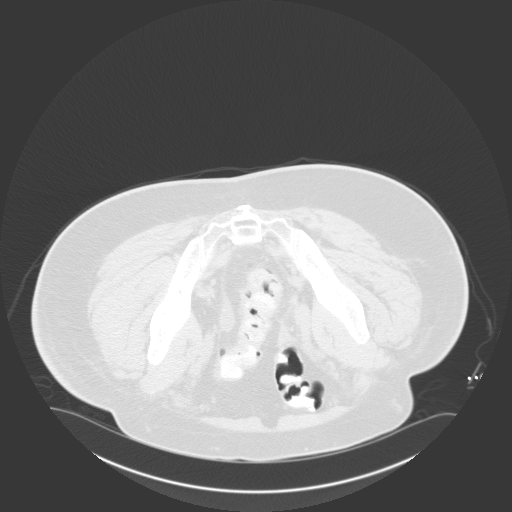
[im 31/40  soft-tissue]
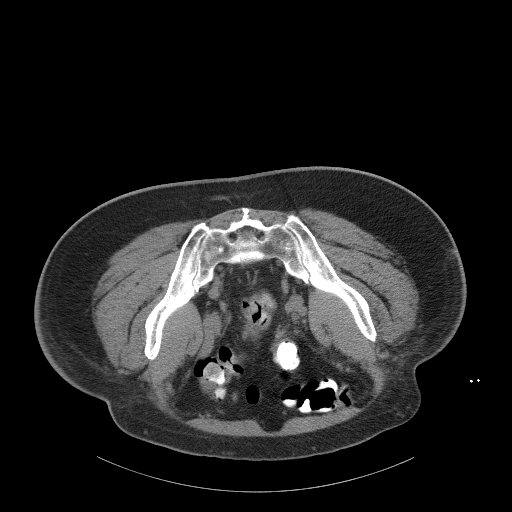
[im 31/40  lung]
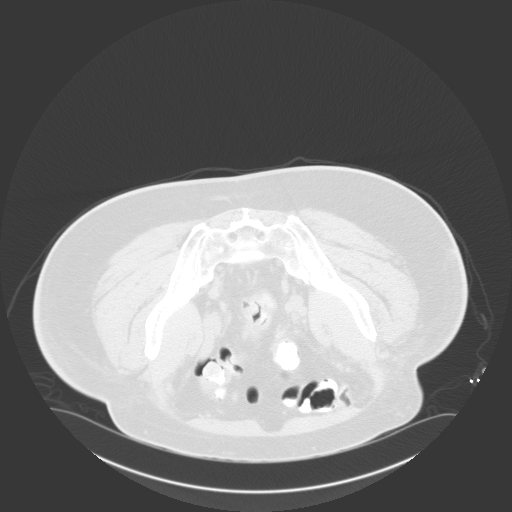
[im 35/40  soft-tissue]
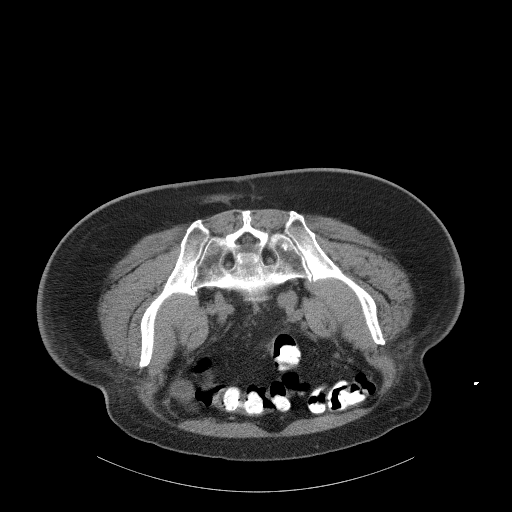
[im 35/40  lung]
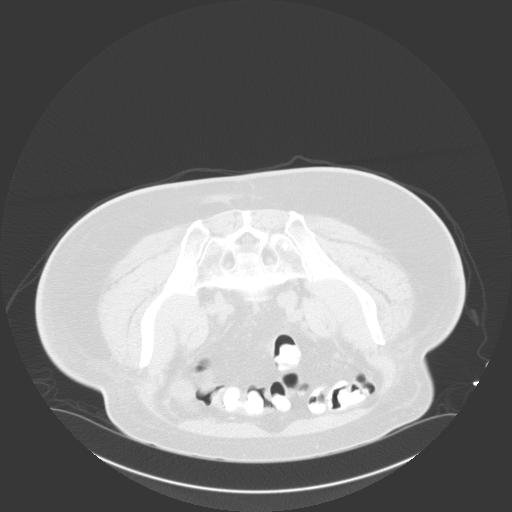

[13 of 32 positions shown; findings below may reference images not displayed]

EXAM:
CT-guided drain placement

MEDICATIONS:
The patient is currently admitted to the hospital and receiving
intravenous antibiotics. The antibiotics were administered within an
appropriate time frame prior to the initiation of the procedure.

ANESTHESIA/SEDATION:
100 mcg fentanyl

COMPLICATIONS:
None immediate.

PROCEDURE:
Informed written consent was obtained from the patient after a
thorough discussion of the procedural risks, benefits and
alternatives. All questions were addressed. Maximal Sterile Barrier
Technique was utilized including caps, mask, sterile gowns, sterile
gloves, sterile drape, hand hygiene and skin antiseptic. A timeout
was performed prior to the initiation of the procedure.

A planning axial CT scan was performed. The deep pelvic fluid
collection was successfully visualized. The rectum was mildly
dilated with gas and blocking the window into the fluid collection.
Therefore, the patient was allowed to walk and attempts were made to
evacuate his bowels. Ultimately, he was able to pass flatus and
imaging was again obtained. With the rectum somewhat decompressed, a
suitable window is now visible. A skin entry site was selected and
marked. Local anesthesia was attained by infiltration with 1%
lidocaine. A small dermatotomy was made. Under intermittent CT
guidance, an 18 gauge trocar needle was advanced along a right
parasacral trajectory between the rectum in the adjacent iliac
vessels and into the pelvic fluid collection. A 0.035 wire was
coiled in the fluid collection. The skin tract was dilated to 10
French. A flexible a 10 French all-purpose drainage catheter was
advanced over the wire and formed in the fluid collection.
Aspiration yields approximately 10 mL of serosanguineous fluid and
debris. This was sent for Gram stain and culture.

The catheter was gently flushed and connected to JP bulb suction
before being secured to the skin with 0 Prolene suture. Final CT
imaging demonstrates a well-positioned drainage catheter. No
evidence of immediate complication.
IMPRESSION: Successful placement of a 10 French drainage catheter via a right
transgluteal approach with evacuation of 10 mL turbid
serosanguineous fluid.

## 2023-10-12 ENCOUNTER — Other Ambulatory Visit (HOSPITAL_COMMUNITY): Payer: Self-pay

## 2025-01-16 ENCOUNTER — Ambulatory Visit: Admitting: Physician Assistant
